# Patient Record
Sex: Female | Born: 1955
Health system: Southern US, Community
[De-identification: ages and names within clinical notes are randomized; demographics above are authoritative.]

## PROBLEM LIST (undated history)

## (undated) DIAGNOSIS — H548 Legal blindness, as defined in USA: Secondary | ICD-10-CM

## (undated) DIAGNOSIS — G43909 Migraine, unspecified, not intractable, without status migrainosus: Secondary | ICD-10-CM

## (undated) DIAGNOSIS — I10 Essential (primary) hypertension: Secondary | ICD-10-CM

## (undated) DIAGNOSIS — E785 Hyperlipidemia, unspecified: Secondary | ICD-10-CM

## (undated) DIAGNOSIS — R42 Dizziness and giddiness: Secondary | ICD-10-CM

## (undated) DIAGNOSIS — K219 Gastro-esophageal reflux disease without esophagitis: Secondary | ICD-10-CM

## (undated) DIAGNOSIS — M1990 Unspecified osteoarthritis, unspecified site: Secondary | ICD-10-CM

## (undated) DIAGNOSIS — Z9884 Bariatric surgery status: Secondary | ICD-10-CM

## (undated) DIAGNOSIS — Z87442 Personal history of urinary calculi: Secondary | ICD-10-CM

## (undated) DIAGNOSIS — F419 Anxiety disorder, unspecified: Secondary | ICD-10-CM

## (undated) DIAGNOSIS — G4730 Sleep apnea, unspecified: Secondary | ICD-10-CM

## (undated) DIAGNOSIS — C50919 Malignant neoplasm of unspecified site of unspecified female breast: Secondary | ICD-10-CM

## (undated) DIAGNOSIS — R7303 Prediabetes: Secondary | ICD-10-CM

## (undated) HISTORY — DX: Malignant neoplasm of unspecified site of unspecified female breast: C50.919

## (undated) HISTORY — DX: Migraine, unspecified, not intractable, without status migrainosus: G43.909

## (undated) HISTORY — DX: Hyperlipidemia, unspecified: E78.5

## (undated) HISTORY — PX: EYE SURGERY: SHX253

## (undated) HISTORY — DX: Morbid (severe) obesity due to excess calories: E66.01

## (undated) HISTORY — DX: Bariatric surgery status: Z98.84

---

## 1990-06-15 HISTORY — PX: KNEE SURGERY: SHX244

## 1998-06-18 ENCOUNTER — Other Ambulatory Visit: Admission: RE | Admit: 1998-06-18 | Discharge: 1998-06-18 | Payer: Self-pay | Admitting: Obstetrics and Gynecology

## 1999-02-06 ENCOUNTER — Emergency Department (HOSPITAL_COMMUNITY): Admission: EM | Admit: 1999-02-06 | Discharge: 1999-02-06 | Payer: Self-pay | Admitting: Emergency Medicine

## 1999-02-10 ENCOUNTER — Emergency Department (HOSPITAL_COMMUNITY): Admission: EM | Admit: 1999-02-10 | Discharge: 1999-02-10 | Payer: Self-pay | Admitting: Emergency Medicine

## 1999-02-14 ENCOUNTER — Encounter: Payer: Self-pay | Admitting: Emergency Medicine

## 1999-02-14 ENCOUNTER — Emergency Department (HOSPITAL_COMMUNITY): Admission: EM | Admit: 1999-02-14 | Discharge: 1999-02-14 | Payer: Self-pay | Admitting: Emergency Medicine

## 2000-03-10 ENCOUNTER — Other Ambulatory Visit: Admission: RE | Admit: 2000-03-10 | Discharge: 2000-03-10 | Payer: Self-pay | Admitting: Obstetrics and Gynecology

## 2000-08-24 ENCOUNTER — Other Ambulatory Visit: Admission: RE | Admit: 2000-08-24 | Discharge: 2000-08-24 | Payer: Self-pay | Admitting: Obstetrics and Gynecology

## 2001-03-28 ENCOUNTER — Other Ambulatory Visit: Admission: RE | Admit: 2001-03-28 | Discharge: 2001-03-28 | Payer: Self-pay | Admitting: *Deleted

## 2002-07-31 ENCOUNTER — Other Ambulatory Visit: Admission: RE | Admit: 2002-07-31 | Discharge: 2002-07-31 | Payer: Self-pay | Admitting: Obstetrics and Gynecology

## 2003-09-05 ENCOUNTER — Other Ambulatory Visit: Admission: RE | Admit: 2003-09-05 | Discharge: 2003-09-05 | Payer: Self-pay | Admitting: Obstetrics and Gynecology

## 2003-09-24 ENCOUNTER — Ambulatory Visit (HOSPITAL_COMMUNITY): Admission: RE | Admit: 2003-09-24 | Discharge: 2003-09-24 | Payer: Self-pay | Admitting: Obstetrics and Gynecology

## 2003-11-15 ENCOUNTER — Encounter (INDEPENDENT_AMBULATORY_CARE_PROVIDER_SITE_OTHER): Payer: Self-pay | Admitting: *Deleted

## 2003-11-15 ENCOUNTER — Inpatient Hospital Stay (HOSPITAL_COMMUNITY): Admission: RE | Admit: 2003-11-15 | Discharge: 2003-11-19 | Payer: Self-pay | Admitting: Obstetrics and Gynecology

## 2004-06-15 HISTORY — PX: ABDOMINAL HYSTERECTOMY: SHX81

## 2004-09-10 ENCOUNTER — Other Ambulatory Visit: Admission: RE | Admit: 2004-09-10 | Discharge: 2004-09-10 | Payer: Self-pay | Admitting: Obstetrics and Gynecology

## 2005-03-02 ENCOUNTER — Ambulatory Visit (HOSPITAL_COMMUNITY): Admission: RE | Admit: 2005-03-02 | Discharge: 2005-03-02 | Payer: Self-pay | Admitting: General Surgery

## 2006-03-23 ENCOUNTER — Encounter: Admission: RE | Admit: 2006-03-23 | Discharge: 2006-06-21 | Payer: Self-pay | Admitting: Obstetrics and Gynecology

## 2006-06-15 HISTORY — PX: COLONOSCOPY: SHX174

## 2007-05-09 LAB — HM COLONOSCOPY

## 2008-04-10 ENCOUNTER — Encounter: Admission: RE | Admit: 2008-04-10 | Discharge: 2008-04-10 | Payer: Self-pay | Admitting: Family Medicine

## 2010-10-31 NOTE — Op Note (Signed)
NAMESHELENE, [name]                         ACCOUNT NO.:  [id number]   MEDICAL RECORD NO.:  [id number]                   PATIENT TYPE:  INP   LOCATION:  9399                                 FACILITY:  [facility]   PHYSICIAN:  Duke Salvia. Marcelle Overlie, M.D.            DATE OF BIRTH:  04-09-1956   DATE OF PROCEDURE:  11/15/2003  DATE OF DISCHARGE:                                 OPERATIVE REPORT   PREOPERATIVE DIAGNOSES:  Leiomyoma, probable right ovarian dermoid cyst.   POSTOPERATIVE DIAGNOSES:  Leiomyoma, probable right ovarian dermoid cyst.   PROCEDURE:  Laparotomy with total abdominal hysterectomy, bilateral salpingo-  oophorectomy.   SURGEON:  Duke Salvia. Marcelle Overlie, M.D.   ASSISTANT:  Stann Mainland. Vincente Poli, M.D.   ANESTHESIA:  General endotracheal.   COMPLICATIONS:  None.   DRAINS:  Foley catheter.   SPECIMENS:  Bilateral tubes and ovaries, uterus.   ESTIMATED BLOOD LOSS:  300.   DESCRIPTION OF PROCEDURE:  The patient was taken to the operating room and  after an adequate level of general endotracheal anesthesia was obtained with  the patient supine, the abdomen prepped and draped in the usual manner for  __________ procedures, the vagina was prepped separately with Betadine and a  Foley catheter was positioned.  A transverse Pfannenstiel incision was made  three fingerbreadths above the symphysis, carried down the fascia which was  incised and extended transversely. The rectus muscle was divided in the  midline, peritoneum entered superiorly without incident and extended in a  vertical fashion.  O'Connor-O'Sullivan retractor was then positioned, bowels  were packed superiorly out of the field, the patient placed in  Trendelenburg. The uterus itself was 10-12 week size with irregular  fibroids, the cul-de-sac was free and clear, the left ovary was  unremarkable.  The right ovary was enlarged approximately 5 cm with a smooth  wall cyst that was not adherent.  Long Kelly clamps were  then placed at each  uteroovarian pedicle starting on the left, the round ligaments were clamped,  divided and suture ligated with #0 Vicryl suture.  The retroperitoneal space  on that side was developed, the course of the ureter was well below, the  left IP ligament was isolated, clamped, divided, first free tied followed by  a suture ligature of #0 Vicryl.  The exact same repeated on the opposite  side. After the right ovary was removed, it was incised off the field and  sebaceous material was seen consistent with a benign dermoid cyst.  The  uterine vasculature pedicles on either side were then skeletonized,  peritoneum carried around to the midline anteriorly, the bladder was  advanced inferiorly with sharp and blunt dissection below the cervicovaginal  junction.  In sequential manner, the uterine vasculature pedicles were  clamped, divided and suture ligated with #0 Vicryl suture followed by the  cardinal ligament, uterosacral ligament and cervicovaginal pedicles. The  specimen was then excised, the vaginal cuff  was closed with a running locked  2-0 Dexon suture.  The pelvis was irrigated with saline and noted to be  hemostatic. Prior to closure, sponge, needle and instrument counts were  reported as correct x2, peritoneum closed with 2-0 Dexon suture, rectus  muscles reapproximated with 2-0 Dexon interrupted sutures. The fascia closed  from laterally to midline on either side with a #0 PDS suture.  Prior to  complete closure, the On-Q pump catheter was placed subfascial and subcu and  attached per protocol. Once the fascia was closed, subcutaneous tissue was  irrigated and noted to be hemostatic.  Clips and Steri-Strips used on the  skin.  She tolerated this well and went to the recovery room in good  condition.                                               Richard M. Marcelle Overlie, M.D.    RMH/MEDQ  D:  11/15/2003  T:  11/15/2003  Job:  [id number]

## 2010-10-31 NOTE — Discharge Summary (Signed)
Rebecca [name], Rebecca [name]                         ACCOUNT NO.:  [id number]   MEDICAL RECORD NO.:  [id number]                   PATIENT TYPE:  INP   LOCATION:  9320                                 [facility]ACILITY:  [facility]   PHYSICIAN:  Duke Salvia. Marcelle Overlie, M.D.            DATE OF BIRTH:  March 18, 1956   DATE OF ADMISSION:  11/15/2003  DATE OF DISCHARGE:  11/19/2003                                 DISCHARGE SUMMARY   DISCHARGE DIAGNOSES:  1. Symptomatic leiomyoma, right ovarian cyst.  2. Total abdominal hysterectomy/bilateral salpingo-oophorectomy this     admission.  3. Postoperative bronchitis, mild bronchospasm.   SUMMARY OF THE HISTORY AND PHYSICAL EXAMINATION:  Please see admission H&P  for details but briefly, a 55 year old G5 P3 with symptomatic leiomyoma and  a right ovarian cyst presents for TAH/BSO.   HOSPITAL COURSE:  On November 15, 2003 under general anesthesia the patient  underwent TAH/BSO.  The right ovary was enlarged to 4 cm, appeared to be a  dermoid cyst.  On postoperative day #1 she was afebrile; WBC 11,000;  hemoglobin 8.5 down from a preoperative of 11.  She developed some increased  upper airway wheezing and was given several albuterol treatments and was  felt to possibly have bronchitis and was started on oral Z-Pak on  postoperative day #2.  She was still afebrile at that point.  Chest x-ray  was done that showed poor inspiration with minimal bronchitic changes.  She  was started on an Alupent inhaler q.i.d.  On postoperative day #3 her  temperature max was 100.6.  Respiratory therapy was consulted and did peak  flow which was normal.  They suggested the possibility of reflux which she  has had problems with in the past and she was started on Protonix.  By June  6 after being on Protonix with Alupent inhaler and a Z-Pak she stated her  breathing was much better.  She cleared some upper airway mucous.  Her lungs  were clear.  She had a low-grade temperature at 99.2.  The incision  was  clean and dry at that point.  She had established normal bowel and urinary  function and was ready for discharge.   LABORATORY DATA:  Preoperative hemoglobin was 11.0.  CBC on November 17, 2003:  WBC 10.4, hemoglobin 8.2.  Admission UA was negative.  Blood type is B  positive, antibody screen is negative.  Pathology report is still pending.   DISPOSITION:  The patient discharged on:  1. Tylox p.r.n. pain,  2. Protonix 40 mg once daily.  3. Z-Pak as directed.  4. Alupent inhaler two puffs q.i.d. p.r.n.   We will see her back in 3 days in our office.  Advised to report any fever  over 101, increased pain or bleeding, incisional redness or drainage, or  shortness of breath.  She was given specific instructions regarding diet,  sex, exercise.   CONDITION:  Good.  ACTIVITY:  Graded increase.                                               Richard M. Marcelle Overlie, M.D.    RMH/MEDQ  D:  11/19/2003  T:  11/19/2003  Job:  [id number]

## 2010-10-31 NOTE — H&P (Signed)
NAMEESTRELLA, ALCARAZ                         ACCOUNT NO.:  [id number]   MEDICAL RECORD NO.:  [id number]                   PATIENT TYPE:  INP   LOCATION:  NA                                   [facility]   PHYSICIAN:  Duke Salvia. Marcelle Overlie, M.D.            DATE OF BIRTH:  02/08/1956   DATE OF ADMISSION:  DATE OF DISCHARGE:                                HISTORY & PHYSICAL   CHIEF COMPLAINT:  Menorrhagia, pelvic pain, leiomyoma.   HISTORY OF PRESENT ILLNESS:  A 55 year old G5, P3 who is noted in the last  year to have heavier cycles, although last year her hemoglobin was on the  anemic side at 10.1.  She had endometrial biopsy performed Oct 23, 2003,  that showed benign proliferative endometrium.  CA125 23.2.  This was done  after CT scan September 24, 2003, showed normal abdominal CT scan.  No pelvic  adenopathy noted.  Right adnexa had a cyst measuring 3.8 x 5.9 x 4.0 most  compatible with a dermoid.  There was no internal calcification noted. The  remainder of the right ovary was unremarkable.  The uterus was enlarged and  lobulated, consistent with leiomyoma.   Our ultrasound in the office September 18, 2003, had shown fibroids, the largest  6.6 x 5.9, 5.8 x 4.4, with a cystic mass and what appeared to be a submucous  fibroid on saline infusion ultrasound.  She presents now for TAH/BSO.  The  procedure including risk of bleeding, infection, transfusion, adjacent organ  injury are reviewed with her.  Other risks regarding phlebitis, wound  infection, along with her expected recovery time, the possible need for ERT  all reviewed with her which she understands and accepts.   ALLERGIES:  1. SULFA.  2. HYDROCODONE.   CURRENT MEDICATIONS:  None.   OBSTETRICAL HISTORY:  Three vaginal deliveries at term.  Two TABs.   PAST SURGICAL HISTORY:  Arthroscopy on her right knee.   FAMILY HISTORY:  Significant for mother with breast cancer.  Father with  hypertension and heart disease.   PHYSICAL EXAMINATION:  VITAL SIGNS:  Temperature 98.2, blood pressure  114/80.  HEENT:  Unremarkable.  NECK:  Supple without masses.  LUNGS:  Clear.  CARDIOVASCULAR:  Regular rate and rhythm without murmurs, rubs, or gallops.  BREASTS:  Without masses.  ABDOMEN:  Soft, flat and nontender.  PELVIC:  Normal external genitalia, vagina and cervix clear.  Pap March 2005  was normal.  Uterus was enlarged and irregular.  Adnexa unremarkable.   IMPRESSION:  1. Symptomatic leiomyoma with anemia.  2. Ovarian cyst, probable dermoid, normal CA125.   PLAN:  Total abdominal hysterectomy, bilateral salpingo-oophorectomy.  Procedure and risks reviewed as above.  Richard M. Marcelle Overlie, M.D.    RMH/MEDQ  D:  11/13/2003  T:  11/13/2003  Job:  [id number]

## 2011-02-18 ENCOUNTER — Other Ambulatory Visit: Payer: Self-pay | Admitting: Allergy and Immunology

## 2011-02-18 ENCOUNTER — Ambulatory Visit
Admission: RE | Admit: 2011-02-18 | Discharge: 2011-02-18 | Disposition: A | Discharge status: Home / Self Care | Payer: 59 | Source: Ambulatory Visit | Attending: Allergy and Immunology | Admitting: Allergy and Immunology

## 2011-02-18 DIAGNOSIS — R059 Cough, unspecified: Secondary | ICD-10-CM

## 2011-02-18 DIAGNOSIS — R05 Cough: Secondary | ICD-10-CM

## 2011-06-03 ENCOUNTER — Ambulatory Visit (INDEPENDENT_AMBULATORY_CARE_PROVIDER_SITE_OTHER): Payer: 59 | Admitting: Surgery

## 2011-06-03 ENCOUNTER — Encounter (INDEPENDENT_AMBULATORY_CARE_PROVIDER_SITE_OTHER): Payer: Self-pay | Admitting: Surgery

## 2011-06-03 ENCOUNTER — Other Ambulatory Visit (INDEPENDENT_AMBULATORY_CARE_PROVIDER_SITE_OTHER): Payer: Self-pay | Admitting: Surgery

## 2011-06-03 VITALS — BP 142/88 | HR 70 | Temp 97.6°F | Resp 18 | Ht 62.0 in | Wt 235.8 lb

## 2011-06-03 DIAGNOSIS — E66813 Obesity, class 3: Secondary | ICD-10-CM

## 2011-06-03 HISTORY — DX: Morbid (severe) obesity due to excess calories: E66.01

## 2011-06-03 HISTORY — DX: Obesity, class 3: E66.813

## 2011-06-03 NOTE — Patient Instructions (Signed)
Rebecca [name] will schedule appointments

## 2011-06-03 NOTE — Progress Notes (Signed)
Addended by: Latricia Heft on: 06/03/2011 12:01 PM   Modules accepted: Orders

## 2011-06-03 NOTE — Progress Notes (Signed)
Chief Complaint:  Morbid obesity BMI 43  History of Present Illness:  Rebecca [name] is an 55 y.o. female who is the wife of [name] he used to I operated on around 2000 with severe Crohn's disease that after complications left him with a ileostomy. She has been to one of our seminars and is interested in lap band surgery. She is aware of the risk and benefits but has tried many things and has been unsuccessful in having sustained weight loss. She is followed by DrClovis Riley.  I discussed the lap band with her and gave her the booklet provided by [employer]. We'll proceed with preliminary workup for laparoscopic gastric banding.  Past Medical History  Diagnosis Date  . Asthma   . Hyperlipidemia   . Hearing loss   . Cough   . Wheezing     Past Surgical History  Procedure Date  . Abdominal hysterectomy 2006  . Eye surgery     Patient unsure of surgery date  . Knee surgery 1992    Medications Prior to Admission  Medication Sig Dispense Refill  . diclofenac (VOLTAREN) 75 MG EC tablet as needed.      [name] estradiol (CLIMARA - DOSED IN MG/24 HR) 0.025 mg/24hr every 7 (seven) days.      [name] lisinopril (PRINIVIL,ZESTRIL) 10 MG tablet Daily.      [name] SINGULAIR 10 MG tablet Daily.      [name] omeprazole (PRILOSEC) 20 MG capsule        No current facility-administered medications on file as of 06/03/2011.   Allergies  Allergen Reactions  . Hydrocodone Hives, Shortness Of Breath and Itching  . Sulfur Itching and Rash   Family History  Problem Relation Age of Onset  . Cancer Mother     breast   Social History:   reports that she quit smoking about 18 years ago. She has never used smokeless tobacco. She reports that she does not drink alcohol or use illicit drugs.   REVIEW OF SYSTEMS - PERTINENT POSITIVES ONLY: Positive for asthma high cholesterol cough wheezing hearing loss  Physical Exam:   Blood pressure 142/88, pulse 70, temperature 97.6 F (36.4 C), temperature source Temporal, resp.  rate 18, height 5\' 2"  (1.575 m), weight 235 lb 12.8 oz (106.958 kg). Body mass index is 43.13 kg/(m^2).  Gen:  No acute distress.  Well nourished and well groomed.   Neurological: Alert and oriented to person, place, and time. Coordination normal.  Head: Normocephalic and atraumatic.  Eyes: Conjunctivae are normal. Pupils are equal, round, and reactive to light. No scleral icterus.  Neck: Normal range of motion. Neck supple. No tracheal deviation or thyromegaly present.  Cardiovascular:  SR without murmurs or gallops Respiratory: Effort normal.  No respiratory distress. No chest wall tenderness. Breath sounds normal.  No wheezes, rales or rhonchi.  GI: Soft. Bowel sounds are normal. The abdomen is soft and nontender.  There is no rebound and no guarding. GU:  No inguinal herniae Musculoskeletal: Normal range of motion. Extremities are nontender.  Lymphadenopathy: No cervical, preauricular, postauricular or axillary adenopathy is present Skin: Skin is warm and dry. No rash noted. No diaphoresis. No erythema. No pallor. No clubbing, cyanosis, or edema.  Pscyh: Normal mood and affect. Behavior is normal. Judgment and thought content normal.   LABORATORY RESULTS: No results found for this or any previous visit (from the past 48 hour(s)).  RADIOLOGY RESULTS: No results found.  Problem List: Active Problems:  * No active hospital problems. *  Assessment & Plan: Morbid obesity with BMI of 43. Felt to be a good candidate for lapband. We'll proceed with workup.    Matt B. Daphine Deutscher, MD, [facility] [phone] beeper [phone]  06/03/2011 11:43 AM

## 2011-06-04 ENCOUNTER — Other Ambulatory Visit (INDEPENDENT_AMBULATORY_CARE_PROVIDER_SITE_OTHER): Payer: Self-pay | Admitting: General Surgery

## 2011-06-04 LAB — COMPREHENSIVE METABOLIC PANEL
ALT: 31 U/L (ref 0–35)
AST: 25 U/L (ref 0–37)
CO2: 26 mEq/L (ref 19–32)
Calcium: 9.9 mg/dL (ref 8.4–10.5)
Chloride: 104 mEq/L (ref 96–112)
Sodium: 142 mEq/L (ref 135–145)
Total Protein: 6.9 g/dL (ref 6.0–8.3)

## 2011-06-04 LAB — CBC
Platelets: 422 10*3/uL — ABNORMAL HIGH (ref 150–400)
RBC: 4.6 MIL/uL (ref 3.87–5.11)
WBC: 9.3 10*3/uL (ref 4.0–10.5)

## 2011-06-04 LAB — T4: T4, Total: 10.6 ug/dL (ref 5.0–12.5)

## 2011-06-05 ENCOUNTER — Other Ambulatory Visit (INDEPENDENT_AMBULATORY_CARE_PROVIDER_SITE_OTHER): Payer: Self-pay | Admitting: Surgery

## 2011-06-05 ENCOUNTER — Other Ambulatory Visit (INDEPENDENT_AMBULATORY_CARE_PROVIDER_SITE_OTHER): Payer: Self-pay | Admitting: General Surgery

## 2011-06-17 ENCOUNTER — Encounter (HOSPITAL_COMMUNITY)
Admission: RE | Disposition: A | Discharge status: Home / Self Care | Payer: Self-pay | Source: Ambulatory Visit | Attending: Surgery

## 2011-06-17 ENCOUNTER — Ambulatory Visit (HOSPITAL_COMMUNITY)
Admission: RE | Admit: 2011-06-17 | Discharge: 2011-06-17 | Disposition: A | Discharge status: Home / Self Care | Payer: 59 | Source: Ambulatory Visit | Attending: Surgery | Admitting: Surgery

## 2011-06-17 SURGERY — BREATH TEST, FOR HELICOBACTER PYLORI

## 2011-06-18 ENCOUNTER — Encounter (HOSPITAL_COMMUNITY): Payer: Self-pay

## 2011-06-22 ENCOUNTER — Encounter: Payer: Self-pay | Admitting: *Deleted

## 2011-06-22 ENCOUNTER — Encounter: Payer: 59 | Attending: Surgery | Admitting: *Deleted

## 2011-06-22 DIAGNOSIS — Z713 Dietary counseling and surveillance: Secondary | ICD-10-CM | POA: Insufficient documentation

## 2011-06-22 DIAGNOSIS — Z01818 Encounter for other preprocedural examination: Secondary | ICD-10-CM | POA: Insufficient documentation

## 2011-06-22 NOTE — Progress Notes (Signed)
  Pre-Op Assessment Visit: Pre-Operative LAGB Surgery  Medical Nutrition Therapy:  Appt start time: 1100 end time:  1200.  Patient was seen on 06/22/2011 for Pre-Operative LAGB Nutrition Assessment. Assessment and letter of approval faxed to [facility] Bariatric Surgery Program coordinator on 06/22/2011.  Approval letter sent to [facility] center and will be available in the chart under the media tab.  Handouts given during visit include:  Pre-Op Goals Handout  Patient to call for Pre-Op and Post-Op Nutrition Education at the [facility] when surgery is scheduled.

## 2011-06-22 NOTE — Patient Instructions (Signed)
   Follow Pre-Op Nutrition Goals to prepare for LAGB Surgery.   Call the [facility] at [phone] once you have been given your surgery date to enrolled in the Pre-Op Nutrition Class. You will need to attend this nutrition class 3-4 weeks prior to your surgery. 

## 2011-06-24 ENCOUNTER — Other Ambulatory Visit: Payer: Self-pay

## 2011-06-24 ENCOUNTER — Ambulatory Visit (HOSPITAL_COMMUNITY)
Admission: RE | Admit: 2011-06-24 | Discharge: 2011-06-24 | Disposition: A | Discharge status: Home / Self Care | Payer: 59 | Source: Ambulatory Visit | Attending: Surgery | Admitting: Surgery

## 2011-06-24 DIAGNOSIS — E785 Hyperlipidemia, unspecified: Secondary | ICD-10-CM | POA: Insufficient documentation

## 2011-06-24 DIAGNOSIS — Z13820 Encounter for screening for osteoporosis: Secondary | ICD-10-CM | POA: Insufficient documentation

## 2011-06-24 DIAGNOSIS — Z6841 Body Mass Index (BMI) 40.0 and over, adult: Secondary | ICD-10-CM | POA: Insufficient documentation

## 2011-06-24 DIAGNOSIS — I10 Essential (primary) hypertension: Secondary | ICD-10-CM | POA: Insufficient documentation

## 2011-06-24 DIAGNOSIS — K7689 Other specified diseases of liver: Secondary | ICD-10-CM | POA: Insufficient documentation

## 2011-07-06 ENCOUNTER — Telehealth (INDEPENDENT_AMBULATORY_CARE_PROVIDER_SITE_OTHER): Payer: Self-pay | Admitting: General Surgery

## 2011-07-06 NOTE — Telephone Encounter (Signed)
Patient was contacted with a positive result for her Breath Tek assessment. Instructed her that she will need to be put on a Prev-Pac for 14 days, pt understood and had no further questions. Rx called to [facility] on [address] spoke with [facility].

## 2011-07-07 ENCOUNTER — Telehealth (INDEPENDENT_AMBULATORY_CARE_PROVIDER_SITE_OTHER): Payer: Self-pay | Admitting: Surgery

## 2011-08-17 ENCOUNTER — Encounter (HOSPITAL_COMMUNITY)
Admission: RE | Disposition: A | Discharge status: Home Health | Payer: Self-pay | Source: Ambulatory Visit | Attending: Surgery

## 2011-08-17 ENCOUNTER — Telehealth (INDEPENDENT_AMBULATORY_CARE_PROVIDER_SITE_OTHER): Payer: Self-pay | Admitting: Surgery

## 2011-08-17 ENCOUNTER — Ambulatory Visit (HOSPITAL_COMMUNITY)
Admission: RE | Admit: 2011-08-17 | Discharge: 2011-08-17 | Disposition: A | Discharge status: Home Health | Payer: 59 | Source: Ambulatory Visit | Attending: Surgery | Admitting: Surgery

## 2011-08-17 DIAGNOSIS — Z01818 Encounter for other preprocedural examination: Secondary | ICD-10-CM | POA: Insufficient documentation

## 2011-08-17 HISTORY — PX: BREATH TEK H PYLORI: SHX5422

## 2011-08-17 SURGERY — BREATH TEST, FOR HELICOBACTER PYLORI

## 2011-08-18 ENCOUNTER — Encounter (HOSPITAL_COMMUNITY): Payer: Self-pay

## 2011-08-18 ENCOUNTER — Encounter (HOSPITAL_COMMUNITY): Payer: Self-pay | Admitting: Surgery

## 2011-08-20 ENCOUNTER — Other Ambulatory Visit (INDEPENDENT_AMBULATORY_CARE_PROVIDER_SITE_OTHER): Payer: Self-pay

## 2011-08-20 ENCOUNTER — Other Ambulatory Visit (INDEPENDENT_AMBULATORY_CARE_PROVIDER_SITE_OTHER): Payer: Self-pay | Admitting: General Surgery

## 2011-09-01 ENCOUNTER — Ambulatory Visit (HOSPITAL_BASED_OUTPATIENT_CLINIC_OR_DEPARTMENT_OTHER): Payer: 59 | Attending: Surgery | Admitting: General Practice

## 2011-09-01 VITALS — Ht 62.0 in | Wt 235.0 lb

## 2011-09-01 DIAGNOSIS — G4733 Obstructive sleep apnea (adult) (pediatric): Secondary | ICD-10-CM

## 2011-09-05 DIAGNOSIS — G4733 Obstructive sleep apnea (adult) (pediatric): Secondary | ICD-10-CM

## 2011-09-05 NOTE — Procedures (Signed)
Rebecca [name], Rebecca [name]               ACCOUNT NO.:  [id number]  MEDICAL RECORD NO.:  [id number]          PATIENT TYPE:  OUT  LOCATION:  SLEEP CENTER                 FACILITY:  [facility], [place]  PHYSICIAN:  Dimitris Shanahan D. Maple Hudson, MD, FCCP, FACPDATE OF BIRTH:  19-Aug-1955  DATE OF STUDY:  09/01/2011                           NOCTURNAL POLYSOMNOGRAM  REFERRING PHYSICIAN:  Thornton Park. Daphine Deutscher, MD  REFERRING PHYSICIAN:  Thornton Park. Daphine Deutscher, MD  INDICATION FOR STUDY:  Hypersomnia with sleep apnea.  EPWORTH SLEEPINESS SCORE:  5/24.  BMI 43, weight 235 pounds, height 62 inches, neck 17 inch.  MEDICATIONS:  Home medications are charted and reviewed.  SLEEP ARCHITECTURE:  Total sleep time 172.5 minutes with sleep efficiency 41.1%.  Stage I was 29.6%, stage II 70.4% of total sleep time.  Stages III and REM were absent.  Sleep latency 31 minute, awake after sleep onset 216.5 minutes, arousal index 55.3.  Bedtime medication:  Hall's cough drops.  RESPIRATORY DATA:  Apnea-hypopnea index (AHI) 59.1 per hour.  A total of 170 events were scored including 3 obstructive apneas, 1 central apnea, 166 hypopneas.  Events were not positional.  REM was absent.  There was insufficient sleep and numbers of respiratory events during the early part of the night to permit application of split protocol CPAP titration on this study night.  OXYGEN DATA:  Moderately loud snoring with oxygen desaturation to a nadir of 82% and mean oxygen saturation through the study of 89.6% on room air.  CARDIAC DATA:  Normal sinus rhythm.  MOVEMENT-PARASOMNIA:  No significant movement disturbance.  Bathroom x2.  IMPRESSIONS-RECOMMENDATIONS: 1. Sleep was markedly fragmented throughout the night with difficulty     maintaining sleep until after 1 a.m. 2. Severe obstructive sleep apnea/hypopnea syndrome, apnea/hypopnea     index 59.1 per hour with non-positional events.  Moderately loud     snoring with oxygen desaturation to a nadir of 82% and  mean oxygen     saturation through the study of 89.6% on room air. 3. Insufficient sleep and numbers of respiratory events in the first     hours of the study to meet protocol requirements for application of     split protocol, continuous positive airway pressure titration on     this study night.  Consider return for CPAP titration as a     dedicated study or evaluate for alternative management as     clinically appropriate.  Weight loss is expected to be beneficial.     Easton Fetty D. Maple Hudson, MD, [facility], FACP Diplomate, American Board of Sleep Medicine    CDY/MEDQ  D:  09/05/2011 11:15:13  T:  09/05/2011 11:46:30  Job:  [id number]

## 2011-10-01 ENCOUNTER — Encounter: Payer: 59 | Attending: Surgery | Admitting: *Deleted

## 2011-10-01 DIAGNOSIS — Z01818 Encounter for other preprocedural examination: Secondary | ICD-10-CM | POA: Insufficient documentation

## 2011-10-01 DIAGNOSIS — Z713 Dietary counseling and surveillance: Secondary | ICD-10-CM | POA: Insufficient documentation

## 2011-10-01 DIAGNOSIS — E66813 Obesity, class 3: Secondary | ICD-10-CM

## 2011-10-04 ENCOUNTER — Encounter: Payer: Self-pay | Admitting: *Deleted

## 2011-10-04 NOTE — Patient Instructions (Addendum)
Follow:   Pre-Op Diet per MD 2 weeks prior to surgery  Phase 2- Liquids (clear/full) 2 weeks after surgery  Vitamin/Mineral/Calcium guidelines for purchasing bariatric supplements  Exercise guidelines pre and post-op per MD  Follow-up at [facility] in 2 weeks post-op for diet advancement. Contact Ahleah Simko as needed with questions/concerns. 

## 2011-10-04 NOTE — Progress Notes (Signed)
  Bariatric Class:  Appt start time: 0830 end time:  0930.  Pre-Operative Nutrition Class  Patient was seen on 10/01/2011 for Pre-Operative Bariatric Surgery Education at the [facility].  Surgery date: 10/20/11 Surgery type: LAGB  Last weight: 236.2 lbs (06/22/11)  Samples given per MNT protocol: Bariatric Advantage Multivitamin Lot # [id number] Exp: 09/13  Bariatric Advantage Calcium Citrate Lot # [id number] Exp: 09/13  Bariatric Advantage B-12 dots Lot # [id number] Exp: 05/13  Celebrate Vitamins Multivitamin Lot # [id number] Exp: 06/14  Celebrate Vitamins Calcium Citrate Lot # [id number] Exp: 07/13  Celebrate Vitamins B-12 dots Lot # [id number] Exp: 07/14  Corliss Marcus  Lot # [id number] Exp: 06/4  The following the learning objective met by the patient during this course:   Identifies Pre-Op Dietary Goals and will begin 2 weeks pre-operatively   Identifies appropriate sources of fluids and proteins   States protein recommendations and appropriate sources pre and post-operatively  Identifies Post-Operative Dietary Goals and will follow for 2 weeks post-operatively  Identifies appropriate multivitamin and calcium sources  Describes the need for physical activity post-operatively and will follow MD recommendations  States when to call healthcare provider regarding medication questions or post-operative complications  Handouts given during class include:  Pre-Op Bariatric Surgery Diet Handout  Protein Shake Handout  Post-Op Bariatric Surgery Nutrition Handout  BELT Program Information Flyer  Support Group Information Flyer  Follow-Up Plan: Patient will follow-up at [facility] 2 weeks post operatively for diet advancement per MD.

## 2011-10-05 ENCOUNTER — Encounter: Payer: Self-pay | Admitting: Pulmonary Disease

## 2011-10-05 ENCOUNTER — Ambulatory Visit (INDEPENDENT_AMBULATORY_CARE_PROVIDER_SITE_OTHER): Payer: 59 | Admitting: Pulmonary Disease

## 2011-10-05 VITALS — BP 128/80 | HR 79 | Temp 98.0°F | Ht 62.0 in | Wt 233.2 lb

## 2011-10-05 DIAGNOSIS — G4733 Obstructive sleep apnea (adult) (pediatric): Secondary | ICD-10-CM

## 2011-10-05 NOTE — Patient Instructions (Signed)
Will set up for cpap at a moderate pressure to start with, and call if having issues with tolerance. Work on weight loss followup with me in 5 weeks.

## 2011-10-05 NOTE — Assessment & Plan Note (Signed)
The patient has been found to have severe obstructive sleep apnea, and she is also symptomatic during the day and while sleeping.  She is scheduled to have bariatric surgery, and I have discussed the importance of treatment of her sleep apnea leading up to and also after her surgery.  I have also discussed the impact to her cardiovascular health.  At this point, I would like to start her on CPAP, and she is agreeable. I will set the patient up on cpap at a moderate pressure level to allow for desensitization, and will troubleshoot the device over the next 4-6weeks if needed.  The pt is to call me if having issues with tolerance.  Will then optimize the pressure once patient is able to wear cpap on a consistent basis.

## 2011-10-05 NOTE — Progress Notes (Signed)
  Subjective:    Patient ID: Rebecca [name], female    DOB: [birth year], 56 y.o.   MRN: [id number]  HPI The patient is a 56 year old female who been asked to see for management of obstructive sleep apnea.  She underwent a sleep study last month which showed severe obstructive sleep apnea, with an AHI of 59 events per hour.  She is scheduled to have bariatric surgery next month.  The patient has been noted to have loud snoring, as well as an abnormal breathing pattern during sleep.  She has frequent awakenings at night, as well as nonrestorative sleep.  She has significant sleep pressure during the day, and will get sleepy in the evenings watching television.  She also has some sleepiness with driving.  The patient states that her weight is up over 15 pounds over the last 2 years, and her Epworth score at the time of her sleep study was only 5 however.  Sleep Questionnaire: What time do you typically go to bed?( Between what hours) 10pm to 11 pm How long does it take you to fall asleep? 15 to 30 mins How many times during the night do you wake up? 6 What time do you get out of bed to start your day? 0630 Do you drive or operate heavy machinery in your occupation? No How much has your weight changed (up or down) over the past two years? (In pounds) 15 lb (6.804 kg) Have you ever had a sleep study before? Yes If yes, location of study? [facility] If yes, date of study? 08/2011 Do you currently use CPAP? No Do you wear oxygen at any time? No    Review of Systems  Constitutional: Negative for fever and unexpected weight change.  HENT: Negative for ear pain, nosebleeds, congestion, sore throat, rhinorrhea, sneezing, trouble swallowing, dental problem, postnasal drip and sinus pressure.   Eyes: Negative for redness and itching.  Respiratory: Positive for shortness of breath. Negative for cough, chest tightness and wheezing.   Cardiovascular: Negative for palpitations and leg swelling.  Gastrointestinal: Negative  for nausea and vomiting.  Genitourinary: Negative for dysuria.  Musculoskeletal: Negative for joint swelling.  Skin: Negative for rash.  Neurological: Negative for headaches.  Hematological: Does not bruise/bleed easily.  Psychiatric/Behavioral: Negative for dysphoric mood. The patient is not nervous/anxious.        Objective:   Physical Exam Constitutional:  Obese female, no acute distress  HENT:  Nares patent without discharge, but septal deviation to left with narrowing.   Oropharynx without exudate, palate and uvula are thickened and elongated.   Eyes:  Perrla, eomi, no scleral icterus  Neck:  No JVD, no TMG  Cardiovascular:  Normal rate, regular rhythm, no rubs or gallops.  No murmurs        Intact distal pulses  Pulmonary :  Normal breath sounds, no stridor or respiratory distress   No rales, rhonchi, or wheezing  Abdominal:  Soft, nondistended, bowel sounds present.  No tenderness noted.   Musculoskeletal:  mild lower extremity edema noted.  Lymph Nodes:  No cervical lymphadenopathy noted  Skin:  No cyanosis noted  Neurologic:  Alert, appropriate, moves all 4 extremities without obvious deficit.         Assessment & Plan:

## 2011-10-12 ENCOUNTER — Encounter (HOSPITAL_COMMUNITY): Payer: Self-pay | Admitting: Pharmacy Technician

## 2011-10-13 ENCOUNTER — Encounter (HOSPITAL_COMMUNITY)
Admission: RE | Admit: 2011-10-13 | Discharge: 2011-10-13 | Disposition: A | Discharge status: Home / Self Care | Payer: 59 | Source: Ambulatory Visit | Attending: Surgery | Admitting: Surgery

## 2011-10-13 ENCOUNTER — Encounter (HOSPITAL_COMMUNITY): Payer: Self-pay

## 2011-10-13 ENCOUNTER — Other Ambulatory Visit (INDEPENDENT_AMBULATORY_CARE_PROVIDER_SITE_OTHER): Payer: Self-pay | Admitting: Surgery

## 2011-10-13 HISTORY — DX: Sleep apnea, unspecified: G47.30

## 2011-10-13 HISTORY — DX: Essential (primary) hypertension: I10

## 2011-10-13 HISTORY — DX: Legal blindness, as defined in USA: H54.8

## 2011-10-13 LAB — CBC
MCV: 92.3 fL (ref 78.0–100.0)
Platelets: 436 10*3/uL — ABNORMAL HIGH (ref 150–400)
RBC: 4.28 MIL/uL (ref 3.87–5.11)
WBC: 7.3 10*3/uL (ref 4.0–10.5)

## 2011-10-13 LAB — DIFFERENTIAL
Basophils Absolute: 0 10*3/uL (ref 0.0–0.1)
Basophils Relative: 1 % (ref 0–1)
Eosinophils Relative: 5 % (ref 0–5)
Monocytes Absolute: 0.6 10*3/uL (ref 0.1–1.0)
Monocytes Relative: 8 % (ref 3–12)

## 2011-10-13 LAB — SURGICAL PCR SCREEN
MRSA, PCR: NEGATIVE
Staphylococcus aureus: NEGATIVE

## 2011-10-13 LAB — COMPREHENSIVE METABOLIC PANEL
ALT: 47 U/L — ABNORMAL HIGH (ref 0–35)
AST: 38 U/L — ABNORMAL HIGH (ref 0–37)
Alkaline Phosphatase: 80 U/L (ref 39–117)
CO2: 27 mEq/L (ref 19–32)
Chloride: 105 mEq/L (ref 96–112)
GFR calc non Af Amer: >90 mL/min (ref >90)
Potassium: 4.3 mEq/L (ref 3.5–5.1)
Sodium: 141 mEq/L (ref 135–145)
Total Bilirubin: 0.2 mg/dL — ABNORMAL LOW (ref 0.3–1.2)

## 2011-10-13 NOTE — Patient Instructions (Addendum)
20 Rebecca [name]  10/13/2011   Your procedure is scheduled on:  10/20/11  Report to SHORT STAY DEPT  At 11:30 AM.  Call this number if you have problems the morning of surgery: [phone]   Remember:   Do not eat food or drink liquids AFTER MIDNIGHT  May have clear liquids UNTIL 6 HOURS BEFORE SURGERY  Clear liquids include soda, tea, black coffee, apple or grape juice, broth.  Take these medicines the morning of surgery with A SIP OF WATER: USE DULERA AND PATANASE INHALER S   Do not wear jewelry, make-up or nail polish.  Do not wear lotions, powders, or perfumes.   Do not shave legs or underarms 48 hrs. before surgery (men may shave face)  Do not bring valuables to the hospital.  Contacts, dentures or bridgework may not be worn into surgery.  Leave suitcase in the car. After surgery it may be brought to your room.  For patients admitted to the hospital, checkout time is 11:00 AM the day of discharge.   Patients discharged the day of surgery will not be allowed to drive home.    Special Instructions:   Please read over the following fact sheets that you were given: MRSA  Information               SHOWER WITH BETASEPT THE NIGHT BEFORE SURGERY AND THE MORNING OF SURGERY                  BRING C-PAP MASK TO HOSPITAL

## 2011-10-15 ENCOUNTER — Encounter (INDEPENDENT_AMBULATORY_CARE_PROVIDER_SITE_OTHER): Payer: Self-pay | Admitting: Surgery

## 2011-10-16 ENCOUNTER — Ambulatory Visit (INDEPENDENT_AMBULATORY_CARE_PROVIDER_SITE_OTHER): Payer: 59 | Admitting: Surgery

## 2011-10-16 ENCOUNTER — Encounter (INDEPENDENT_AMBULATORY_CARE_PROVIDER_SITE_OTHER): Payer: Self-pay | Admitting: Surgery

## 2011-10-16 VITALS — BP 142/98 | HR 80 | Temp 97.5°F | Resp 18 | Ht 62.0 in | Wt 225.4 lb

## 2011-10-16 DIAGNOSIS — E66813 Obesity, class 3: Secondary | ICD-10-CM

## 2011-10-16 NOTE — Progress Notes (Signed)
Chief Complaint:  Morbid obesity BMI 43  History of Present Illness:  Rebecca [name] is an 56 y.o. female who is the wife of [name] he used to I operated on around 2000 with severe Crohn's disease that after complications left him with a ileostomy. She has been to one of our seminars and is interested in lap band surgery. She is aware of the risk and benefits but has tried many things and has been unsuccessful in having sustained weight loss. She is followed by DrClovis Riley.  I discussed the lap band with her and gave her the booklet provided by [employer]. Her UGI was negative for GERD.  Her ultrasound showed fatty infiltration of the liver.  She is ready to undergo Lapband surgery and has been on the preop diet.    Past Medical History  Diagnosis Date  . Asthma   . Hyperlipidemia   . Hearing loss   . Cough   . Wheezing   . Hypertension   . Sleep apnea     uses c-pap  . Blindness, legal     L EYE    Past Surgical History  Procedure Date  . Abdominal hysterectomy 2006  . Eye surgery     Patient unsure of surgery date. Left eye  . Knee surgery 1992    right  . Breath tek h pylori 08/17/2011    Procedure: BREATH TEK H PYLORI;  Surgeon: Valarie Merino, MD;  Location: [facility];  Service: General;  Laterality: N/A;  PATIENT WILL COME AT 0715     (Not in a hospital admission) Allergies  Allergen Reactions  . Hydrocodone Hives, Shortness Of Breath and Itching  . Sulfur Itching and Rash   Family History  Problem Relation Age of Onset  . Cancer Mother     breast  . Heart disease Father    Social History:   reports that she quit smoking about 17 years ago. Her smoking use included Cigarettes. She has a 7.5 pack-year smoking history. She has never used smokeless tobacco. She reports that she does not drink alcohol or use illicit drugs.   REVIEW OF SYSTEMS - PERTINENT POSITIVES ONLY: Positive for asthma high cholesterol cough wheezing hearing loss  Physical Exam:   Blood  pressure 142/98, pulse 80, temperature 97.5 F (36.4 C), temperature source Temporal, resp. rate 18, height 5\' 2"  (1.575 m), weight 225 lb 6.4 oz (102.241 kg). Body mass index is 41.23 kg/(m^2).  Gen:  No acute distress.  Well nourished and well groomed.   Neurological: Alert and oriented to person, place, and time. Coordination normal.  Head: Normocephalic and atraumatic.  Eyes: Conjunctivae are normal. Pupils are equal, round, and reactive to light. No scleral icterus.  Neck: Normal range of motion. Neck supple. No tracheal deviation or thyromegaly present.  Cardiovascular:  SR without murmurs or gallops Respiratory: Effort normal.  No respiratory distress. No chest wall tenderness. Breath sounds normal.  No wheezes, rales or rhonchi.  GI: Soft. Bowel sounds are normal. The abdomen is soft and nontender.  There is no rebound and no guarding. GU:  No inguinal herniae Musculoskeletal: Normal range of motion. Extremities are nontender.  Lymphadenopathy: No cervical, preauricular, postauricular or axillary adenopathy is present Skin: Skin is warm and dry. No rash noted. No diaphoresis. No erythema. No pallor. No clubbing, cyanosis, or edema.  Pscyh: Normal mood and affect. Behavior is normal. Judgment and thought content normal.   LABORATORY RESULTS: No results found for this or any previous  visit (from the past 48 hour(s)).  RADIOLOGY RESULTS: No results found.  Problem List: Active Problems:  * No active hospital problems. *    Assessment & Plan: Morbid obesity with BMI of 43. Felt to be a good candidate for lapband. Proceed with lapband May 7th.     Matt B. Daphine Deutscher, MD, [facility] [phone] beeper [phone]  10/16/2011 4:19 PM

## 2011-10-20 ENCOUNTER — Ambulatory Visit (HOSPITAL_COMMUNITY): Payer: 59 | Admitting: Anesthesiology

## 2011-10-20 ENCOUNTER — Encounter (HOSPITAL_COMMUNITY): Payer: Self-pay | Admitting: Anesthesiology

## 2011-10-20 ENCOUNTER — Encounter (HOSPITAL_COMMUNITY)
Admission: RE | Disposition: A | Discharge status: Home / Self Care | Payer: Self-pay | Source: Ambulatory Visit | Attending: Surgery

## 2011-10-20 ENCOUNTER — Encounter (HOSPITAL_COMMUNITY): Payer: Self-pay | Admitting: *Deleted

## 2011-10-20 ENCOUNTER — Observation Stay (HOSPITAL_COMMUNITY)
Admission: RE | Admit: 2011-10-20 | Discharge: 2011-10-21 | DRG: 621 | Disposition: A | Discharge status: Home / Self Care | Payer: 59 | Source: Ambulatory Visit | Attending: Surgery | Admitting: Surgery

## 2011-10-20 DIAGNOSIS — G4733 Obstructive sleep apnea (adult) (pediatric): Secondary | ICD-10-CM | POA: Insufficient documentation

## 2011-10-20 DIAGNOSIS — Z6841 Body Mass Index (BMI) 40.0 and over, adult: Secondary | ICD-10-CM

## 2011-10-20 DIAGNOSIS — J45909 Unspecified asthma, uncomplicated: Secondary | ICD-10-CM | POA: Insufficient documentation

## 2011-10-20 DIAGNOSIS — H5440 Blindness, one eye, unspecified eye: Secondary | ICD-10-CM | POA: Insufficient documentation

## 2011-10-20 DIAGNOSIS — K7689 Other specified diseases of liver: Secondary | ICD-10-CM | POA: Insufficient documentation

## 2011-10-20 DIAGNOSIS — I10 Essential (primary) hypertension: Secondary | ICD-10-CM

## 2011-10-20 DIAGNOSIS — E785 Hyperlipidemia, unspecified: Secondary | ICD-10-CM | POA: Insufficient documentation

## 2011-10-20 HISTORY — PX: LAPAROSCOPIC GASTRIC BANDING: SHX1100

## 2011-10-20 LAB — CREATININE, SERUM: GFR calc Af Amer: >90 mL/min (ref >90)

## 2011-10-20 LAB — CBC
HCT: 40.7 % (ref 36.0–46.0)
MCH: 30.8 pg (ref 26.0–34.0)
MCHC: 33.7 g/dL (ref 30.0–36.0)
MCV: 91.5 fL (ref 78.0–100.0)
Platelets: 463 10*3/uL — ABNORMAL HIGH (ref 150–400)
RDW: 13.5 % (ref 11.5–15.5)

## 2011-10-20 SURGERY — GASTRIC BANDING, LAPAROSCOPIC
Anesthesia: General | Site: Abdomen | Wound class: Clean

## 2011-10-20 MED ORDER — LACTATED RINGERS IV SOLN
INTRAVENOUS | Status: DC
  Administered 2011-10-20: 15:00:00 via INTRAVENOUS
  Administered 2011-10-20: 1000 mL via INTRAVENOUS

## 2011-10-20 MED ORDER — GLYCOPYRROLATE 0.2 MG/ML IJ SOLN
INTRAMUSCULAR | Status: DC | PRN
  Administered 2011-10-20: .6 mg via INTRAVENOUS

## 2011-10-20 MED ORDER — OLOPATADINE HCL 0.6 % NA SOLN
2.0000 | Freq: Every day | NASAL | Status: DC
  Administered 2011-10-21: 2 via NASAL

## 2011-10-20 MED ORDER — SODIUM CHLORIDE 0.9 % IJ SOLN
INTRAMUSCULAR | Status: DC | PRN
  Administered 2011-10-20: 10 mL

## 2011-10-20 MED ORDER — DROPERIDOL 2.5 MG/ML IJ SOLN
INTRAMUSCULAR | Status: DC | PRN
  Administered 2011-10-20: 0.625 mg via INTRAVENOUS

## 2011-10-20 MED ORDER — HYDROMORPHONE HCL PF 1 MG/ML IJ SOLN: 0.2500 mg | INTRAMUSCULAR | Status: DC | PRN

## 2011-10-20 MED ORDER — BUPIVACAINE LIPOSOME 1.3 % IJ SUSP
20.0000 mL | Freq: Once | INTRAMUSCULAR | Status: AC
  Administered 2011-10-20: 20 mL
  Filled 2011-10-20: qty 20

## 2011-10-20 MED ORDER — ACETAMINOPHEN 10 MG/ML IV SOLN
INTRAVENOUS | Status: DC | PRN
  Administered 2011-10-20: 1000 mg via INTRAVENOUS

## 2011-10-20 MED ORDER — FLUTICASONE PROPIONATE HFA 44 MCG/ACT IN AERO
1.0000 | INHALATION_SPRAY | Freq: Two times a day (BID) | RESPIRATORY_TRACT | Status: DC
  Administered 2011-10-20 – 2011-10-21 (×2): 1 via RESPIRATORY_TRACT
  Filled 2011-10-20: qty 10.6

## 2011-10-20 MED ORDER — MORPHINE SULFATE 2 MG/ML IJ SOLN
2.0000 mg | INTRAMUSCULAR | Status: DC | PRN
  Administered 2011-10-20: 2 mg via INTRAVENOUS
  Filled 2011-10-20: qty 1

## 2011-10-20 MED ORDER — SUFENTANIL CITRATE 50 MCG/ML IV SOLN
INTRAVENOUS | Status: DC | PRN
  Administered 2011-10-20 (×4): 10 ug via INTRAVENOUS
  Administered 2011-10-20 (×2): 5 ug via INTRAVENOUS

## 2011-10-20 MED ORDER — LIDOCAINE HCL (CARDIAC) 20 MG/ML IV SOLN
INTRAVENOUS | Status: DC | PRN
  Administered 2011-10-20: 50 mg via INTRAVENOUS

## 2011-10-20 MED ORDER — PHENYLEPHRINE HCL 10 MG/ML IJ SOLN
INTRAMUSCULAR | Status: DC | PRN
  Administered 2011-10-20 (×2): 80 ug via INTRAVENOUS

## 2011-10-20 MED ORDER — MIDAZOLAM HCL 5 MG/5ML IJ SOLN
INTRAMUSCULAR | Status: DC | PRN
  Administered 2011-10-20 (×2): 1 mg via INTRAVENOUS

## 2011-10-20 MED ORDER — ALBUTEROL SULFATE HFA 108 (90 BASE) MCG/ACT IN AERS
INHALATION_SPRAY | RESPIRATORY_TRACT | Status: AC
  Filled 2011-10-20: qty 6.7

## 2011-10-20 MED ORDER — HEPARIN SODIUM (PORCINE) 5000 UNIT/ML IJ SOLN
INTRAMUSCULAR | Status: AC
  Administered 2011-10-20: 5000 [IU] via SUBCUTANEOUS
  Filled 2011-10-20: qty 1

## 2011-10-20 MED ORDER — ONDANSETRON HCL 4 MG/2ML IJ SOLN: 4.0000 mg | INTRAMUSCULAR | Status: DC | PRN

## 2011-10-20 MED ORDER — NEOSTIGMINE METHYLSULFATE 1 MG/ML IJ SOLN
INTRAMUSCULAR | Status: DC | PRN
  Administered 2011-10-20: 4 mg via INTRAVENOUS

## 2011-10-20 MED ORDER — PROPOFOL 10 MG/ML IV EMUL
INTRAVENOUS | Status: DC | PRN
  Administered 2011-10-20: 200 mg via INTRAVENOUS
  Administered 2011-10-20: 30 mg via INTRAVENOUS

## 2011-10-20 MED ORDER — ACETAMINOPHEN 160 MG/5ML PO SOLN: 650.0000 mg | ORAL | Status: DC | PRN

## 2011-10-20 MED ORDER — MEPERIDINE HCL 50 MG/ML IJ SOLN: 6.2500 mg | INTRAMUSCULAR | Status: DC | PRN

## 2011-10-20 MED ORDER — LACTATED RINGERS IV SOLN: INTRAVENOUS | Status: DC

## 2011-10-20 MED ORDER — PROMETHAZINE HCL 25 MG/ML IJ SOLN: 6.2500 mg | INTRAMUSCULAR | Status: DC | PRN

## 2011-10-20 MED ORDER — DEXTROSE 5 % IV SOLN
2.0000 g | INTRAVENOUS | Status: AC
  Administered 2011-10-20: 2 g via INTRAVENOUS
  Filled 2011-10-20 (×2): qty 2

## 2011-10-20 MED ORDER — UNJURY VANILLA POWDER: 2.0000 [oz_av] | Freq: Four times a day (QID) | ORAL | Status: DC

## 2011-10-20 MED ORDER — BUPIVACAINE LIPOSOME 1.3 % IJ SUSP: INTRAMUSCULAR | Status: DC | PRN

## 2011-10-20 MED ORDER — HEPARIN SODIUM (PORCINE) 5000 UNIT/ML IJ SOLN
5000.0000 [IU] | INTRAMUSCULAR | Status: AC
  Administered 2011-10-20: 5000 [IU] via SUBCUTANEOUS

## 2011-10-20 MED ORDER — SUCCINYLCHOLINE CHLORIDE 20 MG/ML IJ SOLN
INTRAMUSCULAR | Status: DC | PRN
  Administered 2011-10-20: 100 mg via INTRAVENOUS

## 2011-10-20 MED ORDER — DEXAMETHASONE SODIUM PHOSPHATE 10 MG/ML IJ SOLN
INTRAMUSCULAR | Status: DC | PRN
  Administered 2011-10-20: 10 mg via INTRAVENOUS

## 2011-10-20 MED ORDER — UNJURY CHICKEN SOUP POWDER: 2.0000 [oz_av] | Freq: Four times a day (QID) | ORAL | Status: DC

## 2011-10-20 MED ORDER — LIDOCAINE HCL 4 % MT SOLN
OROMUCOSAL | Status: DC | PRN
  Administered 2011-10-20: 4 mL via TOPICAL

## 2011-10-20 MED ORDER — ROCURONIUM BROMIDE 100 MG/10ML IV SOLN
INTRAVENOUS | Status: DC | PRN
  Administered 2011-10-20: 40 mg via INTRAVENOUS

## 2011-10-20 MED ORDER — LISINOPRIL 20 MG PO TABS
20.0000 mg | ORAL_TABLET | Freq: Every day | ORAL | Status: DC
  Administered 2011-10-21: 20 mg via ORAL
  Filled 2011-10-20 (×2): qty 1

## 2011-10-20 MED ORDER — ONDANSETRON HCL 4 MG/2ML IJ SOLN
INTRAMUSCULAR | Status: DC | PRN
  Administered 2011-10-20: 4 mg via INTRAVENOUS

## 2011-10-20 MED ORDER — 0.9 % SODIUM CHLORIDE (POUR BTL) OPTIME
TOPICAL | Status: DC | PRN
  Administered 2011-10-20: 1000 mL

## 2011-10-20 MED ORDER — MOMETASONE FURO-FORMOTEROL FUM 100-5 MCG/ACT IN AERO
2.0000 | INHALATION_SPRAY | Freq: Two times a day (BID) | RESPIRATORY_TRACT | Status: DC
  Administered 2011-10-20 – 2011-10-21 (×2): 2 via RESPIRATORY_TRACT
  Filled 2011-10-20: qty 13

## 2011-10-20 MED ORDER — HEPARIN SODIUM (PORCINE) 5000 UNIT/ML IJ SOLN
5000.0000 [IU] | Freq: Three times a day (TID) | INTRAMUSCULAR | Status: DC
  Administered 2011-10-20 – 2011-10-21 (×2): 5000 [IU] via SUBCUTANEOUS
  Filled 2011-10-20 (×5): qty 1

## 2011-10-20 MED ORDER — OXYCODONE-ACETAMINOPHEN 5-325 MG/5ML PO SOLN
5.0000 mL | ORAL | Status: DC | PRN
  Administered 2011-10-21: 10 mL via ORAL
  Filled 2011-10-20: qty 10

## 2011-10-20 MED ORDER — UNJURY CHOCOLATE CLASSIC POWDER
2.0000 [oz_av] | Freq: Four times a day (QID) | ORAL | Status: DC
  Administered 2011-10-21: 2 [oz_av] via ORAL

## 2011-10-20 MED ORDER — KCL IN DEXTROSE-NACL 20-5-0.45 MEQ/L-%-% IV SOLN
INTRAVENOUS | Status: DC
  Administered 2011-10-20 – 2011-10-21 (×2): via INTRAVENOUS
  Filled 2011-10-20 (×4): qty 1000

## 2011-10-20 MED ORDER — ACETAMINOPHEN 10 MG/ML IV SOLN
INTRAVENOUS | Status: AC
  Filled 2011-10-20: qty 100

## 2011-10-20 MED ORDER — MONTELUKAST SODIUM 10 MG PO TABS
10.0000 mg | ORAL_TABLET | Freq: Every day | ORAL | Status: DC
  Filled 2011-10-20 (×2): qty 1

## 2011-10-20 MED ORDER — KCL IN DEXTROSE-NACL 20-5-0.45 MEQ/L-%-% IV SOLN
INTRAVENOUS | Status: AC
  Filled 2011-10-20: qty 1000

## 2011-10-20 SURGICAL SUPPLY — 68 items
APL SKNCLS STERI-STRIP NONHPOA (GAUZE/BANDAGES/DRESSINGS)
BAND LAP 10.0 W/TUBES (Band) ×2 IMPLANT
BENZOIN TINCTURE PRP APPL 2/3 (GAUZE/BANDAGES/DRESSINGS) IMPLANT
BLADE HEX COATED 2.75 (ELECTRODE) ×2 IMPLANT
BLADE SURG 15 STRL LF DISP TIS (BLADE) ×1 IMPLANT
BLADE SURG 15 STRL SS (BLADE) ×2
CANISTER SUCTION 2500CC (MISCELLANEOUS) ×2 IMPLANT
CLOTH BEACON ORANGE TIMEOUT ST (SAFETY) ×2 IMPLANT
COVER SURGICAL LIGHT HANDLE (MISCELLANEOUS) ×2 IMPLANT
DECANTER SPIKE VIAL GLASS SM (MISCELLANEOUS) ×4 IMPLANT
DEVICE SUT QUICK LOAD TK 5 (STAPLE) ×6 IMPLANT
DEVICE SUT TI-KNOT TK 5X26 (MISCELLANEOUS) ×2 IMPLANT
DEVICE SUTURE ENDOST 10MM (ENDOMECHANICALS) IMPLANT
DISSECTOR BLUNT TIP ENDO 5MM (MISCELLANEOUS) IMPLANT
DRAPE CAMERA CLOSED 9X96 (DRAPES) ×2 IMPLANT
ELECT REM PT RETURN 9FT ADLT (ELECTROSURGICAL) ×2
ELECTRODE REM PT RTRN 9FT ADLT (ELECTROSURGICAL) ×1 IMPLANT
GLOVE BIOGEL M 8.0 STRL (GLOVE) ×2 IMPLANT
GLOVE BIOGEL PI IND STRL 7.0 (GLOVE) ×1 IMPLANT
GLOVE BIOGEL PI IND STRL 7.5 (GLOVE) IMPLANT
GLOVE BIOGEL PI INDICATOR 7.0 (GLOVE) ×2
GLOVE BIOGEL PI INDICATOR 7.5 (GLOVE) ×1
GLOVE SS BIOGEL STRL SZ 7.5 (GLOVE) IMPLANT
GLOVE SUPERSENSE BIOGEL SZ 7.5 (GLOVE) ×1
GLOVE SURG SS PI 6.5 STRL IVOR (GLOVE) ×3 IMPLANT
GOWN STRL NON-REIN LRG LVL3 (GOWN DISPOSABLE) ×4 IMPLANT
GOWN STRL REIN XL XLG (GOWN DISPOSABLE) ×4 IMPLANT
HOVERMATT SINGLE USE (MISCELLANEOUS) ×2 IMPLANT
KIT BASIN OR (CUSTOM PROCEDURE TRAY) ×2 IMPLANT
MESH HERNIA 1X4 RECT BARD (Mesh General) IMPLANT
MESH HERNIA BARD 1X4 (Mesh General) ×1 IMPLANT
NDL SPNL 22GX3.5 QUINCKE BK (NEEDLE) ×1 IMPLANT
NEEDLE SPNL 22GX3.5 QUINCKE BK (NEEDLE) ×2 IMPLANT
NS IRRIG 1000ML POUR BTL (IV SOLUTION) ×2 IMPLANT
PACK UNIVERSAL I (CUSTOM PROCEDURE TRAY) ×2 IMPLANT
PENCIL BUTTON HOLSTER BLD 10FT (ELECTRODE) ×2 IMPLANT
SCALPEL HARMONIC ACE (MISCELLANEOUS) IMPLANT
SET IRRIG TUBING LAPAROSCOPIC (IRRIGATION / IRRIGATOR) IMPLANT
SHEARS CURVED HARMONIC AC 45CM (MISCELLANEOUS) IMPLANT
SLEEVE ADV FIXATION 5X100MM (TROCAR) ×1 IMPLANT
SLEEVE Z-THREAD 5X100MM (TROCAR) IMPLANT
SOLUTION ANTI FOG 6CC (MISCELLANEOUS) ×2 IMPLANT
SPONGE GAUZE 4X4 12PLY (GAUZE/BANDAGES/DRESSINGS) ×2 IMPLANT
SPONGE LAP 18X18 X RAY DECT (DISPOSABLE) ×2 IMPLANT
STAPLER VISISTAT 35W (STAPLE) ×2 IMPLANT
STRIP CLOSURE SKIN 1/2X4 (GAUZE/BANDAGES/DRESSINGS) IMPLANT
SUT ETHIBOND 2 0 SH (SUTURE) ×6
SUT ETHIBOND 2 0 SH 36X2 (SUTURE) ×3 IMPLANT
SUT PROLENE 2 0 CT2 30 (SUTURE) ×2 IMPLANT
SUT SILK 0 (SUTURE) ×2
SUT SILK 0 30XBRD TIE 6 (SUTURE) IMPLANT
SUT SURGIDAC NAB ES-9 0 48 120 (SUTURE) IMPLANT
SUT VIC AB 2-0 SH 27 (SUTURE)
SUT VIC AB 2-0 SH 27X BRD (SUTURE) IMPLANT
SUT VIC AB 4-0 SH 18 (SUTURE) ×2 IMPLANT
SYR 20CC LL (SYRINGE) ×2 IMPLANT
SYR 30ML LL (SYRINGE) ×2 IMPLANT
SYS KII OPTICAL ACCESS 15MM (TROCAR) ×2
SYSTEM KII OPTICAL ACCESS 15MM (TROCAR) ×1 IMPLANT
TOWEL OR 17X26 10 PK STRL BLUE (TOWEL DISPOSABLE) ×4 IMPLANT
TROCAR ADV FIXATION 11X100MM (TROCAR) ×1 IMPLANT
TROCAR XCEL NON-BLD 11X100MML (ENDOMECHANICALS) ×2 IMPLANT
TROCAR Z-THREAD FIOS 11X100 BL (TROCAR) IMPLANT
TROCAR Z-THREAD FIOS 5X100MM (TROCAR) ×2 IMPLANT
TROCAR Z-THREAD SLEEVE 11X100 (TROCAR) ×1 IMPLANT
TUBE CALIBRATION ×1 IMPLANT
TUBE CALIBRATION LAPBAND (TUBING) ×2 IMPLANT
TUBING INSUFFLATION 10FT LAP (TUBING) ×2 IMPLANT

## 2011-10-20 NOTE — Anesthesia Preprocedure Evaluation (Addendum)
Anesthesia Evaluation  Patient identified by MRN, date of birth, ID band Patient awake    Reviewed: Allergy & Precautions, H&P , NPO status , Patient's Chart, lab work & pertinent test results  Airway Mallampati: II TM Distance: >3 FB Neck ROM: Full    Dental No notable dental hx.    Pulmonary neg pulmonary ROS, asthma , sleep apnea and Continuous Positive Airway Pressure Ventilation ,  breath sounds clear to auscultation  Pulmonary exam normal       Cardiovascular hypertension, Pt. on medications negative cardio ROS  Rhythm:Regular Rate:Normal     Neuro/Psych negative neurological ROS  negative psych ROS   GI/Hepatic negative GI ROS, Neg liver ROS,   Endo/Other  negative endocrine ROSMorbid obesity  Renal/GU negative Renal ROS  negative genitourinary   Musculoskeletal negative musculoskeletal ROS (+)   Abdominal   Peds negative pediatric ROS (+)  Hematology negative hematology ROS (+)   Anesthesia Other Findings   Reproductive/Obstetrics negative OB ROS                          Anesthesia Physical Anesthesia Plan  ASA: III  Anesthesia Plan: General   Post-op Pain Management:    Induction: Intravenous  Airway Management Planned: Oral ETT  Additional Equipment:   Intra-op Plan:   Post-operative Plan: Extubation in OR  Informed Consent: I have reviewed the patients History and Physical, chart, labs and discussed the procedure including the risks, benefits and alternatives for the proposed anesthesia with the patient or authorized representative who has indicated his/her understanding and acceptance.   Dental advisory given  Plan Discussed with: CRNA  Anesthesia Plan Comments: (No tylenol)        Anesthesia Quick Evaluation

## 2011-10-20 NOTE — H&P (Signed)
Chief Complaint: Morbid obesity BMI 43  History of Present Illness: Rebecca [name] is an 56 y.o. female who is the wife of [name] he used to I operated on around 2000 with severe Crohn's disease that after complications left him with a ileostomy. She has been to one of our seminars and is interested in lap band surgery. She is aware of the risk and benefits but has tried many things and has been unsuccessful in having sustained weight loss. She is followed by DrClovis Riley.  I discussed the lap band with her and gave her the booklet provided by [employer]. Her UGI was negative for GERD. Her ultrasound showed fatty infiltration of the liver. She is ready to undergo Lapband surgery and has been on the preop diet.  Past Medical History   Diagnosis  Date   .  Asthma    .  Hyperlipidemia    .  Hearing loss    .  Cough    .  Wheezing    .  Hypertension    .  Sleep apnea      uses c-pap   .  Blindness, legal      L EYE    Past Surgical History   Procedure  Date   .  Abdominal hysterectomy  2006   .  Eye surgery      Patient unsure of surgery date. Left eye   .  Knee surgery  1992     right   .  Breath tek h pylori  08/17/2011     Procedure: BREATH TEK H PYLORI; Surgeon: Valarie Merino, MD; Location: [facility]; Service: General; Laterality: N/A; PATIENT WILL COME AT 0715     (Not in a hospital admission)  Allergies   Allergen  Reactions   .  Hydrocodone  Hives, Shortness Of Breath and Itching   .  Sulfur  Itching and Rash    Family History   Problem  Relation  Age of Onset   .  Cancer  Mother       breast    .  Heart disease  Father     Social History: reports that she quit smoking about 17 years ago. Her smoking use included Cigarettes. She has a 7.5 pack-year smoking history. She has never used smokeless tobacco. She reports that she does not drink alcohol or use illicit drugs.  REVIEW OF SYSTEMS - PERTINENT POSITIVES ONLY:  Positive for asthma high cholesterol cough wheezing  hearing loss  Physical Exam:  Blood pressure 142/98, pulse 80, temperature 97.5 F (36.4 C), temperature source Temporal, resp. rate 18, height 5\' 2"  (1.575 m), weight 225 lb 6.4 oz (102.241 kg).  Body mass index is 41.23 kg/(m^2).  Gen: No acute distress. Well nourished and well groomed.  Neurological: Alert and oriented to person, place, and time. Coordination normal.  Head: Normocephalic and atraumatic.  Eyes: Conjunctivae are normal. Pupils are equal, round, and reactive to light. No scleral icterus.  Neck: Normal range of motion. Neck supple. No tracheal deviation or thyromegaly present.  Cardiovascular: SR without murmurs or gallops  Respiratory: Effort normal. No respiratory distress. No chest wall tenderness. Breath sounds normal. No wheezes, rales or rhonchi.  GI: Soft. Bowel sounds are normal. The abdomen is soft and nontender. There is no rebound and no guarding.  GU: No inguinal herniae Musculoskeletal: Normal range of motion. Extremities are nontender.  Lymphadenopathy: No cervical, preauricular, postauricular or axillary adenopathy is present Skin: Skin is warm and dry.  No rash noted. No diaphoresis. No erythema. No pallor. No clubbing, cyanosis, or edema.  Pscyh: Normal mood and affect. Behavior is normal. Judgment and thought content normal.  LABORATORY RESULTS:  No results found for this or any previous visit (from the past 48 hour(s)).  RADIOLOGY RESULTS:  No results found.  Problem List:  Active Problems:  * No active hospital problems. *   Assessment & Plan:  Morbid obesity with BMI of 43. Felt to be a good candidate for lapband. Proceed with lapband May 7th.  Matt B. Daphine Deutscher, MD, [facility]  [phone] beeper  [phone] There has been no change in the patient's past medical history or physical exam in the past 24 hours to the best of my knowledge. I examined the patient in the holding area and have made any changes to the history and  physical exam report that is included above.   Expectations and outcome results have been discussed with the patient to include risks and benefits.  All questions have been answered and we will proceed with previously discussed procedure noted and signed in the consent form in the patient's record.    Nahara Dona BMD FACS 1:50 PM  10/20/2011

## 2011-10-20 NOTE — Anesthesia Postprocedure Evaluation (Signed)
  Anesthesia Post-op Note  Patient: Rebecca [name]  Procedure(s) Performed: Procedure(s) (LRB): LAPAROSCOPIC GASTRIC BANDING (N/A)  Patient Location: PACU  Anesthesia Type: General  Level of Consciousness: awake and alert   Airway and Oxygen Therapy: Patient Spontanous Breathing  Post-op Pain: mild  Post-op Assessment: Post-op Vital signs reviewed, Patient's Cardiovascular Status Stable, Respiratory Function Stable, Patent Airway and No signs of Nausea or vomiting  Post-op Vital Signs: stable  Complications: No apparent anesthesia complications

## 2011-10-20 NOTE — Op Note (Signed)
@  DATE@ Surgeon: Wenda Low, MD, FACS Asst:  Jaclynn Guarneri, MD, FACS  Procedure: Laparoscopic adjustable gastric banding with APS  Anes:  General  EBL:  Minimal  Description of Procedure  The patient was taken to OR # 1 and given general anesthesia.  After a prep with PCMX the patient was draped and a timeout performed.  Access to the abdomen was achieved with a 0 degree Optiview technique through the left upper quadrant.    Adhesions were minimal.  Ports were placed to the the right of the midline including a 15 trocar in  the right upper quadrant placed obliquely.  The Satira Mccallum was used to retract the left lateral segment and the peritoneum was incised along the left crus.  The pars flaccida technique was utilized to insert the blunt "finger " dissector from right to left behind the stomach.    The lapband APS  Had been previously flushed and was inserted through the 15 trocar.  It was placed in the blunt dissector tip and pulled around behind the stomach.   The EJ junction as assessed for a hiatus hernia and none was found.  The band was plicated with 3 sutures of Surgidec and Ty Knots.  The tubing was brought out through the lower incision on the right and connected to the port which had mesh sewn onto the back and was placed into the subcutaneous pocket.   The patient was taken to the PACU in stable condition.    Matt B. Daphine Deutscher, MD, [facility], [place] [phone]

## 2011-10-20 NOTE — Progress Notes (Signed)
Patient does not wish to use nocturnal PPV tonight due to some abdominal discomfort and bloating. She would like to start passing gas prior to use. RT to follow up tomorrow for CPAP use. RN aware

## 2011-10-20 NOTE — Transfer of Care (Signed)
Immediate Anesthesia Transfer of Care Note  Patient: Rebecca [name]  Procedure(s) Performed: Procedure(s) (LRB): LAPAROSCOPIC GASTRIC BANDING (N/A)  Patient Location: PACU  Anesthesia Type: General  Level of Consciousness: awake, alert , oriented, patient cooperative and responds to stimulation  Airway & Oxygen Therapy: Patient Spontanous Breathing and Patient connected to face mask oxygen  Post-op Assessment: Report given to PACU RN, Post -op Vital signs reviewed and stable and Patient moving all extremities X 4  Post vital signs: stable  Complications: No apparent anesthesia complications

## 2011-10-21 ENCOUNTER — Inpatient Hospital Stay (HOSPITAL_COMMUNITY): Payer: 59

## 2011-10-21 ENCOUNTER — Other Ambulatory Visit (HOSPITAL_COMMUNITY): Payer: 59

## 2011-10-21 DIAGNOSIS — Z9889 Other specified postprocedural states: Secondary | ICD-10-CM

## 2011-10-21 LAB — DIFFERENTIAL
Eosinophils Absolute: 0 10*3/uL (ref 0.0–0.7)
Eosinophils Relative: 0 % (ref 0–5)
Lymphocytes Relative: 13 % (ref 12–46)
Lymphs Abs: 1.4 10*3/uL (ref 0.7–4.0)
Monocytes Absolute: 0.4 10*3/uL (ref 0.1–1.0)
Monocytes Relative: 4 % (ref 3–12)

## 2011-10-21 LAB — CBC
HCT: 39.3 % (ref 36.0–46.0)
MCH: 30.6 pg (ref 26.0–34.0)
MCV: 91.8 fL (ref 78.0–100.0)
Platelets: 434 10*3/uL — ABNORMAL HIGH (ref 150–400)
RBC: 4.28 MIL/uL (ref 3.87–5.11)
WBC: 10.8 10*3/uL — ABNORMAL HIGH (ref 4.0–10.5)

## 2011-10-21 MED ORDER — OXYCODONE-ACETAMINOPHEN 5-325 MG/5ML PO SOLN: 5.0000 mL | ORAL | Status: DC | PRN

## 2011-10-21 MED ORDER — IOHEXOL 300 MG/ML  SOLN: 50.0000 mL | Freq: Once | INTRAMUSCULAR | Status: DC | PRN

## 2011-10-21 NOTE — Discharge Summary (Signed)
Physician Discharge Summary  Patient ID: Rebecca [name] MRN: [id number] DOB/AGE: [id number] 58 y.o.  Admit date: 10/20/2011 Discharge date: 10/21/2011  Admission Diagnoses:  obesity  Discharge Diagnoses:  same  Active Problems:  * No active hospital problems. *    Surgery:  lapband aps system  Discharged Condition: improved  Hospital Course:   Had lapband and kept overnight.  UGI pending  Consults: none  Significant Diagnostic Studies: UGI pending    Discharge Exam: Blood pressure 150/83, pulse 76, temperature 97.8 F (36.6 C), temperature source Oral, resp. rate 18, height 5\' 2"  (1.575 m), weight 223 lb 6 oz (101.322 kg), SpO2 95.00%. Minimal pain.    Disposition: 06-Home-Health Care Svc  Discharge Orders    Future Appointments: Provider: Department: Dept Phone: Center:   11/03/2011 4:00 PM [facility] [phone] NDM   11/10/2011 2:30 PM Barbaraann Share, MD [facility] [phone] None   11/13/2011 4:50 PM Valarie Merino, MD [facility] [phone] None     Future Orders Please Complete By Expires   Diet - low sodium heart healthy      Increase activity slowly      No wound care        Medication List  As of 10/21/2011  9:21 AM   TAKE these medications         beclomethasone 80 MCG/ACT inhaler   Commonly known as: QVAR   Inhale 1 puff into the lungs daily at 12 noon.      diclofenac 75 MG EC tablet   Commonly known as: VOLTAREN   Take 75 mg by mouth every 8 (eight) hours as needed. For pain      DULERA IN   Inhale 2 puffs into the lungs 2 (two) times daily.      estradiol 0.025 mg/24hr   Commonly known as: CLIMARA - Dosed in mg/24 hr   Place 1 patch onto the skin every 7 (seven) days.      lisinopril 20 MG tablet   Commonly known as: PRINIVIL,ZESTRIL   Take 20 mg by mouth daily.      multivitamin with minerals tablet   Take 1 tablet by mouth daily with breakfast.      oxyCODONE-acetaminophen 5-325 MG/5ML  solution   Commonly known as: ROXICET   Take 5-10 mLs by mouth every 4 (four) hours as needed.      PATANASE 0.6 % Soln   Generic drug: Olopatadine HCl   Place 2 puffs into the nose daily with breakfast.      SINGULAIR 10 MG tablet   Generic drug: montelukast   Take 10 mg by mouth at bedtime.      VENTOLIN IN   Inhale 2 puffs into the lungs every 6 (six) hours as needed. For shortness of breath           Follow-up Information    Follow up with Valarie Merino, MD. (see at previously made appt)    Contact information:   [facility] [address] [place] [address] [phone]          Signed: Valarie Merino 10/21/2011, 9:21 AM

## 2011-10-21 NOTE — Discharge Instructions (Signed)
Gastric Banding Surgery Care After Refer to this sheet in the next few weeks. These discharge instructions provide you with general information on caring for yourself after you leave the hospital. Your caregiver may also give you specific instructions. Your treatment has been planned according to the most current medical practices available, but unavoidable complications sometimes occur. If you have any problems or questions after discharge, call your caregiver. HOME CARE INSTRUCTIONS  Activity  Take frequent walks throughout the day. This will help to prevent blood clots. Do not sit for longer than 45 minutes to 1 hour while awake for 4 to 6 weeks after surgery.   Continue to do coughing and deep breathing exercises once you get home. This will help to prevent pneumonia.   Do not do strenuous activities, such as heavy lifting, pushing, or pulling, until after your follow-up visit with your caregiver. Do not lift anything heavier than 10 lb (4.5 kg).   Talk with your caregiver about when you may return to work and your exercise routine.   Do not drive while taking prescription pain medicine.  Nutrition  It is very important that you drink at least 80 oz (2,400 mL) of fluid a day.   You should stay on a clear liquid diet until your follow-up visit with your caregiver. Keep sugar-free, clear liquid items on hand, including:   Tea: hot or cold. Drink only decaffeinated for the first month.   Broths: clear beef, chicken, vegetable.   Others: water, sugar-free frozen ice pops, flavored water, gelatin (after 1 week).   Do not consume caffeine for 1 month. Large amounts of caffeine can cause dehydration.   A dietician may also give you specific instructions.   Follow your caregiver's recommendations about vitamins and protein requirements after surgery.  Hygiene  You may shower and wash your hair 2 days after surgery. Pat incisions dry. Do not rub incisions with a washcloth or towel.    Follow your caregiver's recommendations about baths and pools following surgery.  Pain control  If a prescription medicine was given, follow your caregiver's directions.   You may feel some gas pain caused by the carbon dioxide used to inflate your abdomen during surgery. This pain can be felt in your chest, shoulder, back, or abdominal area. Moving around often is advised.  Incision care You may have 4 or more small incisions. They are closed with skin adhesive strips and have a clear plastic covering over them. You may remove your dressings the number of days directed by your caregiver after surgery. Check your incisions and surrounding area daily for any redness, swelling, discoloration, fluid (drainage), or bleeding. Dark red, dried blood may appear under these coverings. This is normal. SEEK MEDICAL CARE IF:   You develop persistent nausea and vomiting.   You have pain and discomfort with swallowing.   You have pain, swelling, or warmth in the lower extremities.   You have an oral temperature above 102 F (38.9 C).   You develop chills.   Your incision sites look red, swollen, or have drainage.   Your stool is black, tarry, or maroon in color.   You are lightheaded when standing.   You have any questions or concerns.  SEEK IMMEDIATE MEDICAL CARE IF:   You have chest pain.   You have severe calf pain or pain not relieved by medicine.   You develop shortness of breath or difficulty breathing.   You feel confused.   You have slurred speech.     You suddenly feel weak.  MAKE SURE YOU:   Understand these instructions.   Will watch your condition.   Will get help right away if you are not doing well or get worse.  Document Released: 01/14/2004 Document Revised: 05/21/2011 Document Reviewed: 10/22/2009 [employer] Patient Information 2012 [employer]. 

## 2011-10-21 NOTE — Progress Notes (Signed)
UGI results called to Dr. Daphine Deutscher; orders received to begin POD #1 diet and if tolerates well may disharge; Colin Mulders, RN aware of results and orders. Talmadge Chad, RN BAriatric Nurse Coordinator

## 2011-10-21 NOTE — Progress Notes (Signed)
Pt Rebecca [name]; alert and oriented; VSS; tolerating sips of water well; denies any nausea or vomiting; denies flatus; no BM; voiding without difficulty; ambulating in hallways without difficulty; c/o abdominal discomfort with relief from prn pain meds; has follow up appts with [facility] and [facility]; aware of BELT program and support group; discharge instructions given to pt and reviewed and pt verbalized understanding of. Awaiting UGI; doppler study negative. ADJUSTABLE GASTRIC BAND DISCHARGE INSTRUCTIONS  Drs. Fredrik Rigger, Hoxworth, Wilson, and Industry Call if you have any problems.   Call [phone] and ask for the surgeon on call.    If you need immediate assistance come to the ER at [facility]. Tell the ER personnel that you are a new post-op gastric banding patient. Signs and symptoms to report:   Severe vomiting or nausea. If you cannot tolerate clear liquids for longer than 1 day, you need to call your surgeon.    Abdominal pain which does not get better after taking your pain medication   Fever greater than 101 F degree   Difficulty breathing   Chest pain    Redness, swelling, drainage, or foul odor at incision sites    If your incisions open or pull apart   Swelling or pain in calf (lower leg)   Diarrhea, frequent watery, uncontrolled bowel movements.   Constipation, (no bowel movements for 3 days) if this occurs, Take Milk of Magnesia, 2 tablespoons by mouth, 3 times a day for 2 days if needed.  Call your doctor if constipation continues. Stop taking Milk of Magnesia once you have had a bowel movement. You may also use Miralax according to the label instructions.   Anything you consider "abnormal for you".   Normal side effects after Surgery:   Unable to sleep at night or concentrate   Irritability   Being tearful (crying) or depressed   These are common complaints, possibly related to your anesthesia, stress of surgery and change in lifestyle, that usually go away a few weeks after  surgery.  If these feelings continue, call your medical doctor.  Wound Care You may have surgical glue, steri-strips, or staples over your incisions after surgery.  Surgical glue:  Looks like a clear film over your incisions and will wear off gradually. Steri-strips: Strips of tape over your incisions. You may notice a yellowish color on the skin underneath the steri-strips. This is a substance used to make the steri-strips stick better. Do not pull the steri-strips off - let them fall off. Staples: Cherlynn Polo may be removed before you leave the hospital. If you go home with staples, call [facility] [phone]) for an appointment with your surgeon's nurse to have staples removed in 7 - 10 days. Showering: You may shower two days after your surgery unless otherwise instructed by your surgeon. Wash gently around wounds with warm soapy water, rinse well, and gently pat dry.  If you have a drain, you may need someone to hold this while you shower. Avoid tub baths until staples are removed and incisions are healed.    Medications   Medications should be liquid or crushed if larger than the size of a dime.  Extended release pills should not be crushed.   Depending on the size and number of medications you take, you may need to stagger/change the time you take your medications so that you do not over-fill your pouch.    Make sure you follow-up with your primary care physician to make medication adjustments needed during rapid weight  loss and life-style adjustment.   If you are diabetic, follow up with the doctor that prescribes your diabetes medication(s) within one week after surgery and check your blood sugar regularly.   Do not drive while taking narcotics!   Do not take acetaminophen (Tylenol) and Roxicet or Lortab Elixir at the same time since these pain medications contain acetaminophen.  Diet at home: (First 2 Weeks)  You will see the nutritionist two weeks after your surgery. She  will advance your diet if you are tolerating liquids well. Once at home, if you have severe vomiting or nausea and cannot tolerate clear liquids lasting longer than 1 day, call your surgeon.  For Same Day Surgery Discharge Patients: The day of surgery drink water only: 2 ounces every 4 hours. If you are tolerating water, begin drinking your high protein shake the next morning. For Overnight Stay Patients: Begin high protein shake 2 ounces every 3 hours, 5 - 6 times per day.  Gradually increase the amount you drink as tolerated.  You may find it easier to slowly sip shakes throughout the day.  It is important to get your proteins in first.   Protein Shake   Drink at least 2 ounces of shake 5-6 times per day   Each serving of protein shakes should have a minimum of 15 grams of protein and no more than 5 grams of carbohydrate    Increase the amount of protein shake you drink as tolerated   Protein powder may be added to fluids such as non-fat milk or Lactaid milk (limit to 20 grams added protein powder per serving   The initial goal is to drink at least 8 ounces of protein shake/drink per day (or as directed by the nutritionist). Some examples of protein shakes are [employer], [employer]illard[employer], EAS Edge HP, and Unjury. Hydration   Gradually increase the amount of water and other liquids as tolerated (See Acceptable Fluids)   Gradually increase the amount of protein shake as tolerated     Sip fluids slowly and throughout the day   May use Sugar substitutes, use sparingly (limit to 6 - 8 packets per day).  Your fluid goal is 64 ounces of fluid daily. It may take a few weeks to build up to this.         32 oz (or more) should be clear liquids and 32 oz (or more) should be full liquids.         Liquids should not contain sugar, caffeine, or carbonation!  Acceptable Fluids Clear Liquids:   Water or Sugar-free flavored water, Fruit H2O   Decaffeinated coffee or tea (sugar-free)   Crystal Lite,  Wyler's Lite, Minute Maid Lite   Sugar-free Jell-O   Bouillon or broth   Sugar-free Popsicle:   *Less than 20 calories each; Limit 1 per day   Full Liquids:              Protein Shakes/Drinks + 2 choices per day of other full liquids shown below.    Other full liquids must be: No more than 12 grams of Carbs per serving,  No more than 3 grams of Fat per serving   Strained low-fat cream soup   Non-Fat milk   Fat-free Lactaid Milk   Sugar-free yogurt (Dannon Lite & Fit) Vitamins and Minerals (Start 1 day after surgery unless otherwise directed)   1 Chewable Multivitamin / Multimineral Supplement (i.e. Centrum for Adults)   Chewable Calcium Citrate with Vitamin D-3. Take 1500  mg each day.           (Example: 3 Chewable Calcium Plus 600 with Vitamin D-3 can be found at [facility])           Do not mix multivitamins containing iron with calcium supplements; take 2 hours   apart   Do not substitute Tums (calcium carbonate) for your calcium   Menstruating women and those at risk for anemia may need extra iron. Talk with your doctor to see if you need additional iron.     If you need extra iron:  Total daily Iron recommendations (including Vitamins) = 50 - 100 mg Iron/day Do not stop taking or change any vitamins or minerals until you talk to your nutritionist or surgeon. Your nutritionist and / or physician must approve all vitamin and mineral supplements. Exercise For maximum success, begin exercising as soon as your doctor recommends. Make sure your physician approves any physical activity.   Depending on fitness level, begin with a simple walking program   Walk 5-15 minutes each day, 7 days per week.    Slowly increase until you are walking 30-45 minutes per day   Consider joining our BELT program. [phone] or email [email or web address] Things to remember:   You may have sexual relations when you feel comfortable. It is VERY important for female patients to use a reliable birth control method.  Fertility often increases after surgery. Do not get pregnant for at least 18 months.   It is very important to keep all follow up appointments with your surgeon, nutritionist, primary care physician, and behavioral health practitioner. After the first year, please follow up with your bariatric surgeon at least once a year in order to maintain best weight loss results.  [facility]: [phone] [facility]: [phone]   Free counseling is available for you and your family through collaboration between [facility] and [place]. Please call [phone] and leave a message.    Consider purchasing a medical alert bracelet that says you had lap-band surgery.    [facility] has a free Bariatric Surgery Support Group that meets monthly, the 3rd Thursday, 6 pm, Classroom #1, [place]. You may register online at [email or web address], but registration is not necessary. Select Classes and Support Groups, Bariatric Surgery, or Call [phone]   Do not return to work or drive until cleared by your surgeon   Use your CPAP when sleeping if applicable   Do not lift anything greater than ten pounds for at least two weeks.   You will probably have your first fill (fluid added to your band) 6 weeks after surgery  Talmadge Chad, RN BAriatric Nurse Coordinator

## 2011-10-21 NOTE — Progress Notes (Signed)
Patient ID: Rebecca [name], female   DOB: May 16, 1956, 56 y.o.   MRN: [id number] [facility] Progress Note:   1 Day Post-Op  Subjective: Mental status is clear Objective: Vital signs in last 24 hours: Temp:  [97.8 F (36.6 C)-98.4 F (36.9 C)] 97.8 F (36.6 C) (05/08 0554) Pulse Rate:  [65-92] 76  (05/08 0554) Resp:  [12-20] 18  (05/08 0554) BP: (114-165)/(58-87) 150/83 mmHg (05/08 0554) SpO2:  [94 %-98 %] 95 % (05/08 0827) Weight:  [223 lb 6 oz (101.322 kg)] 223 lb 6 oz (101.322 kg) (05/07 1750)  Intake/Output from previous day: 05/07 0701 - 05/08 0700 In: 2020 [P.O.:20; I.V.:2000] Out: 1800 [Urine:1800] Intake/Output this shift: Total I/O In: -  Out: 500 [Urine:500]  Physical Exam: Work of breathing is  normal  Lab Results:  Results for orders placed during the hospital encounter of 10/20/11 (from the past 48 hour(s))  HEMOGLOBIN AND HEMATOCRIT, BLOOD     Status: Normal   Collection Time   10/20/11  4:41 PM      Component Value Range Comment   Hemoglobin 12.5  12.0 - 15.0 (g/dL)    HCT 47.4  25.9 - 56.3 (%)   CBC     Status: Abnormal   Collection Time   10/20/11  6:43 PM      Component Value Range Comment   WBC 13.2 (*) 4.0 - 10.5 (K/uL)    RBC 4.45  3.87 - 5.11 (MIL/uL)    Hemoglobin 13.7  12.0 - 15.0 (g/dL)    HCT 87.5  64.3 - 32.9 (%)    MCV 91.5  78.0 - 100.0 (fL)    MCH 30.8  26.0 - 34.0 (pg)    MCHC 33.7  30.0 - 36.0 (g/dL)    RDW 51.8  84.1 - 66.0 (%)    Platelets 463 (*) 150 - 400 (K/uL)   CREATININE, SERUM     Status: Normal   Collection Time   10/20/11  6:43 PM      Component Value Range Comment   Creatinine, Ser 0.76  0.50 - 1.10 (mg/dL)    GFR calc non Af Amer >90  >90 (mL/min)    GFR calc Af Amer >90  >90 (mL/min)   CBC     Status: Abnormal   Collection Time   10/21/11  5:30 AM      Component Value Range Comment   WBC 10.8 (*) 4.0 - 10.5 (K/uL)    RBC 4.28  3.87 - 5.11 (MIL/uL)    Hemoglobin 13.1  12.0 - 15.0 (g/dL)    HCT 63.0  16.0 -  10.9 (%)    MCV 91.8  78.0 - 100.0 (fL)    MCH 30.6  26.0 - 34.0 (pg)    MCHC 33.3  30.0 - 36.0 (g/dL)    RDW 32.3  55.7 - 32.2 (%)    Platelets 434 (*) 150 - 400 (K/uL)   DIFFERENTIAL     Status: Abnormal   Collection Time   10/21/11  5:30 AM      Component Value Range Comment   Neutrophils Relative 84 (*) 43 - 77 (%)    Neutro Abs 9.1 (*) 1.7 - 7.7 (K/uL)    Lymphocytes Relative 13  12 - 46 (%)    Lymphs Abs 1.4  0.7 - 4.0 (K/uL)    Monocytes Relative 4  3 - 12 (%)    Monocytes Absolute 0.4  0.1 - 1.0 (K/uL)    Eosinophils Relative  0  0 - 5 (%)    Eosinophils Absolute 0.0  0.0 - 0.7 (K/uL)    Basophils Relative 0  0 - 1 (%)    Basophils Absolute 0.0  0.0 - 0.1 (K/uL)     Radiology/Results: No results found.  Anti-infectives: Anti-infectives     Start     Dose/Rate Route Frequency Ordered Stop   10/20/11 1111   cefOXitin (MEFOXIN) 2 g in dextrose 5 % 50 mL IVPB        2 g 100 mL/hr over 30 Minutes Intravenous 60 min pre-op 10/20/11 1111 10/20/11 1430          Assessment/Plan: Problem List: Patient Active Problem List  Diagnoses  . Obesity, Class III, BMI 40-49.9 (morbid obesity)  . OSA (obstructive sleep apnea)    Awaiting UGI.  If ok will discharge 1 Day Post-Op    LOS: 1 day   Matt B. Daphine Deutscher, MD, [facility] [phone] beeper [phone]  10/21/2011 9:16 AM

## 2011-10-21 NOTE — Progress Notes (Signed)
*  PRELIMINARY RESULTS* Vascular Ultrasound Lower Extremity Venous Duplex has been completed.  Preliminary findings: Bilaterally no evidence of DVT or baker's cyst.  Farrel Demark, RDMS 10/21/2011, 8:50 AM

## 2011-10-22 ENCOUNTER — Encounter (HOSPITAL_COMMUNITY): Payer: Self-pay | Admitting: Surgery

## 2011-10-22 ENCOUNTER — Telehealth (INDEPENDENT_AMBULATORY_CARE_PROVIDER_SITE_OTHER): Payer: Self-pay | Admitting: General Surgery

## 2011-10-22 NOTE — Telephone Encounter (Signed)
Pt calling with concerns about her sore throat.  She had surgery 2 days ago.  Voice sounded raspy.  Pt admits she is asthmatic and uses two inhalers.  Discussed ET tube irritation, which is very transient and should be resolved by now.  Suggested gargling with warm, salt water for comfort.  Consider appt with PCP if persistent sore throat.  She understands and will monitor through the weekend.

## 2011-11-03 ENCOUNTER — Encounter: Payer: 59 | Attending: Surgery | Admitting: *Deleted

## 2011-11-03 VITALS — Ht 62.0 in | Wt 215.4 lb

## 2011-11-03 DIAGNOSIS — Z01818 Encounter for other preprocedural examination: Secondary | ICD-10-CM | POA: Insufficient documentation

## 2011-11-03 DIAGNOSIS — Z713 Dietary counseling and surveillance: Secondary | ICD-10-CM | POA: Insufficient documentation

## 2011-11-04 ENCOUNTER — Encounter: Payer: Self-pay | Admitting: *Deleted

## 2011-11-04 NOTE — Progress Notes (Signed)
  Bariatric Class:  Appt start time: 1600 end time:  1700.  2 Week Post-Operative Nutrition Class  Patient was seen on 11/03/11 for Post-[facility]ative [facility]   Surgery date: 10/20/11 Surgery type: LAGB Start weight at [facility]: 236.2 lbs  Weight today: 215.4 lbs Weight change: 20.8 lbs Total weight lost: 20.8 lbs BMI: 39.4 kg/m^2  The following the learning objective met the patient during this course:   Identifies Phase 3A (Soft, High Proteins) Dietary Goals and will begin from 2 weeks post-operatively to 2 months post-operatively   Identifies appropriate sources of fluids and proteins   States protein recommendations and appropriate sources post-operatively  Identifies the need for appropriate texture modifications, mastication, and bite sizes when consuming solids  Identifies appropriate multivitamin and calcium sources post-operatively  Describes the need for physical activity post-operatively and will follow MD recommendations  States when to call healthcare provider regarding medication questions or post-operative complications  Handouts given during class include:  Phase 3A: Soft, High Protein Diet Handout  Band Fill Guidelines Handout  Follow-Up Plan: Patient will follow-up at [facility] in 6 weeks for 2 months post-op nutrition visit for diet advancement per MD.

## 2011-11-04 NOTE — Patient Instructions (Signed)
Patient to follow Phase 3A-Soft, High Protein Diet and follow-up at [facility] in 6 weeks for 2 months post-op nutrition visit for diet advancement. 

## 2011-11-10 ENCOUNTER — Ambulatory Visit: Payer: 59 | Admitting: Pulmonary Disease

## 2011-11-12 ENCOUNTER — Encounter (INDEPENDENT_AMBULATORY_CARE_PROVIDER_SITE_OTHER): Payer: 59 | Admitting: Surgery

## 2011-11-13 ENCOUNTER — Ambulatory Visit (INDEPENDENT_AMBULATORY_CARE_PROVIDER_SITE_OTHER): Payer: 59 | Admitting: Surgery

## 2011-11-13 ENCOUNTER — Encounter (INDEPENDENT_AMBULATORY_CARE_PROVIDER_SITE_OTHER): Payer: Self-pay | Admitting: Surgery

## 2011-11-13 VITALS — BP 134/82 | HR 74 | Temp 97.6°F | Resp 14 | Ht 62.0 in | Wt 214.5 lb

## 2011-11-13 DIAGNOSIS — Z9884 Bariatric surgery status: Secondary | ICD-10-CM

## 2011-11-13 NOTE — Progress Notes (Signed)
Rebecca [name] 56 y.o.  Body mass index is 39.23 kg/(m^2).  Patient Active Problem List  Diagnoses  . Obesity, Class III, BMI 40-49.9 (morbid obesity)  . OSA (obstructive sleep apnea)    Allergies  Allergen Reactions  . Hydrocodone Hives, Shortness Of Breath and Itching  . Sulfur Itching and Rash    Past Surgical History  Procedure Date  . Abdominal hysterectomy 2006  . Eye surgery     Patient unsure of surgery date. Left eye  . Knee surgery 1992    right  . Breath tek h pylori 08/17/2011    Procedure: BREATH TEK H PYLORI;  Surgeon: Valarie Merino, MD;  Location: [facility];  Service: General;  Laterality: N/A;  PATIENT WILL COME AT 0715  . Laparoscopic gastric banding 10/20/2011    Procedure: LAPAROSCOPIC GASTRIC BANDING;  Surgeon: Valarie Merino, MD;  Location: [facility];  Service: General;  Laterality: N/A;   Benita Stabile, MD, MD No diagnosis found.  3 weeks out from Lapband.  Incisions are fine.  Doing well  Will plan lapband fill in 3 weeks.   Matt B. Daphine Deutscher, MD, [facility] [phone] beeper [phone]  11/13/2011 5:03 PM

## 2011-11-13 NOTE — Patient Instructions (Signed)
Stay on low carb high protein diet

## 2011-11-18 DIAGNOSIS — Z9884 Bariatric surgery status: Secondary | ICD-10-CM

## 2011-11-18 HISTORY — DX: Bariatric surgery status: Z98.84

## 2011-11-30 ENCOUNTER — Ambulatory Visit (INDEPENDENT_AMBULATORY_CARE_PROVIDER_SITE_OTHER): Payer: 59 | Admitting: Pulmonary Disease

## 2011-11-30 ENCOUNTER — Encounter: Payer: Self-pay | Admitting: Pulmonary Disease

## 2011-11-30 VITALS — BP 118/90 | HR 82 | Temp 98.1°F | Ht 62.0 in | Wt 212.0 lb

## 2011-11-30 DIAGNOSIS — G4733 Obstructive sleep apnea (adult) (pediatric): Secondary | ICD-10-CM

## 2011-11-30 NOTE — Patient Instructions (Addendum)
Will set machine on automatic setting for next 2-3 weeks.  Will get download off machine to make sure is working properly.  If you are comfortable on this, can leave on this setting.  If not, can use download report to get you on optimal cpap pressure.  Work on weight loss If doing well, would like to see you back in 6mos.

## 2011-11-30 NOTE — Assessment & Plan Note (Signed)
The patient is doing well with CPAP for treatment of her obstructive sleep apnea.  She feels that her sleep is greatly improved, as is her daytime alertness.  We have yet to optimize her pressure, and I suspect that she is having breakthrough events in the late morning hours.  Will put her machine on the automatic setting for the next few weeks, and I will her know her optimal pressure.  She may be better served by the automatic mode indefinitely while she is losing weight over the upcoming months.  I will discuss this with her once I receive the download.  Otherwise, she is to follow up with me in 6 months.

## 2011-11-30 NOTE — Progress Notes (Signed)
  Subjective:    Patient ID: Rebecca [name], female    DOB: 1955-08-28, 56 y.o.   MRN: [id number]  HPI The patient comes in today for followup of her known obstructive sleep apnea.  She has been wearing CPAP compliantly by her download, feels that her sleep is much improved along with her daytime alertness.  She has not had any issues with her mask fit or pressure, but does awaken around 3 to 4 AM on occasion and feels like she has to pull the mask off.  I have reminded her that we have yet to optimize her pressure, and I'm wondering if she is having breakthrough apneas during her last REM period that is requiring a higher pressure.  The patient has also had her bariatric surgery since the last visit, and is scheduled to have her appliance inflated soon.     Review of Systems  Constitutional: Negative.  Negative for fever and unexpected weight change.  HENT: Negative.  Negative for ear pain, nosebleeds, congestion, sore throat, rhinorrhea, sneezing, trouble swallowing, dental problem, postnasal drip and sinus pressure.   Eyes: Negative.  Negative for redness and itching.  Respiratory: Negative.  Negative for cough, chest tightness, shortness of breath and wheezing.   Cardiovascular: Negative.  Negative for palpitations and leg swelling.  Gastrointestinal: Negative.  Negative for nausea and vomiting.  Genitourinary: Negative.  Negative for dysuria.  Musculoskeletal: Negative.  Negative for joint swelling.  Skin: Negative.  Negative for rash.  Neurological: Negative.  Negative for headaches.  Hematological: Negative.  Does not bruise/bleed easily.  Psychiatric/Behavioral: Negative.  Negative for dysphoric mood. The patient is not nervous/anxious.        Objective:   Physical Exam Obese female in no acute distress No skin breakdown or pressure necrosis from the CPAP mask Lower extremities with mild edema, no cyanosis Alert and oriented, does not appear sleepy, moves all 4  extremities.       Assessment & Plan:

## 2011-12-03 ENCOUNTER — Encounter (INDEPENDENT_AMBULATORY_CARE_PROVIDER_SITE_OTHER): Payer: 59

## 2011-12-10 ENCOUNTER — Ambulatory Visit (INDEPENDENT_AMBULATORY_CARE_PROVIDER_SITE_OTHER): Payer: 59 | Admitting: Physician Assistant

## 2011-12-10 ENCOUNTER — Encounter (INDEPENDENT_AMBULATORY_CARE_PROVIDER_SITE_OTHER): Payer: Self-pay | Admitting: Physician Assistant

## 2011-12-10 VITALS — BP 134/90 | HR 80 | Temp 97.9°F | Resp 14 | Ht 62.0 in | Wt 208.8 lb

## 2011-12-10 DIAGNOSIS — Z4651 Encounter for fitting and adjustment of gastric lap band: Secondary | ICD-10-CM

## 2011-12-10 NOTE — Progress Notes (Signed)
  HISTORY: Rebecca [name] is a 56 y.o.female who received an AP-Standard lap-band in May 2013 by Dr. Daphine Deutscher. She comes in with mild increases in hunger and portion sizes. No regurgitation or reflux.  VITAL SIGNS: Filed Vitals:   12/10/11 1044  BP: 134/90  Pulse: 80  Temp: 97.9 F (36.6 C)  Resp: 14    PHYSICAL EXAM: Physical exam reveals a very well-appearing 56 y.o.female in no apparent distress Neurologic: Awake, alert, oriented Psych: Bright affect, conversant Respiratory: Breathing even and unlabored. No stridor or wheezing Abdomen: Soft, nontender, nondistended to palpation. Incisions well-healed. No incisional hernias. Port easily palpated. Extremities: Atraumatic, good range of motion.  ASSESMENT: 56 y.o.  female  s/p AP-Standard lap-band.   PLAN: The patient's port was accessed with a 20G Huber needle without difficulty. Clear fluid was aspirated and 1 mL saline was added to the port. The patient was able to swallow water without difficulty following the procedure and was instructed to take clear liquids for the next 24-48 hours and advance slowly as tolerated.

## 2011-12-10 NOTE — Patient Instructions (Signed)
Take clear liquids tonight. Thin protein shakes are ok to start tomorrow morning. Slowly advance your diet thereafter. Call us if you have persistent vomiting or regurgitation, night cough or reflux symptoms. Return as scheduled or sooner if you notice no changes in hunger/portion sizes.  

## 2011-12-15 ENCOUNTER — Encounter: Payer: 59 | Attending: Surgery | Admitting: *Deleted

## 2011-12-15 ENCOUNTER — Encounter: Payer: Self-pay | Admitting: *Deleted

## 2011-12-15 VITALS — Ht 62.0 in | Wt 207.5 lb

## 2011-12-15 DIAGNOSIS — E66813 Obesity, class 3: Secondary | ICD-10-CM

## 2011-12-15 DIAGNOSIS — Z01818 Encounter for other preprocedural examination: Secondary | ICD-10-CM | POA: Insufficient documentation

## 2011-12-15 DIAGNOSIS — Z713 Dietary counseling and surveillance: Secondary | ICD-10-CM | POA: Insufficient documentation

## 2011-12-15 NOTE — Progress Notes (Signed)
Follow-up visit:  8 Weeks Post-Operative LAGB Surgery  Medical Nutrition Therapy:  Appt start time: 1500  End time:  1530.  Primary concerns today: Post-operative Bariatric Surgery Nutrition Management.  Surgery date: 10/20/11 Surgery type: LAGB Start weight at [facility]: 236.2 lbs  Weight today: 207.5 lbs Weight change: 7.9 lbs Total weight lost: 28.7 lbs BMI: 38.0 kg/m^2  Weight goal: 150 lbs % Weight goal met: 33%  TANITA  BODY COMP RESULTS  12/15/11   %Fat 49.2%   Fat Mass (lbs) 102.0   Fat Free Mass (lbs) 105.5   Total Body Water (lbs) 77.0   24-hr recall: B (AM): Boiled egg (2) OR 1 pc cheese S (AM): Protein shake (17g)  L (PM): Baked chicken (2-2.5 oz), green beans or squash/zucchini Snk (PM): Protein shake (17g) D (PM): Baked chicken (2-2.5 oz), green or pinto beans (1 cup) Snk (PM): 1 pc cheese  Fluid intake: 22 oz protein shakes, 16-32 oz water, ~14 oz decaf coffee = 50-65 oz Estimated total protein intake: 85-95 g  Medications: See medication list.  Supplementation: Taking MVI regularly; Not taking calcium regularly because it makes her gag. Discussed Celebrate Vitamins' calcium chew and Azyah states she will try them.   Using straws: No  Drinking while eating: No Hair loss: No Carbonated beverages: No N/V/D/C: Mild constipation reported Last Lap-Band fill:  First fill 12/10/11. Put her in green zone, but now feels more in yellow zone.   Recent physical activity:  Walk 20-30 min @ 4-5 days/week  Progress Towards Goal(s):  In progress.  Handouts given during visit include:  Phase 3B: High Protein + Non-Starchy Vegetables   Samples given during visit include:   Unjury Protein Powder Unflavored (2 ea):  Lot # [id number]; Exp: 08/14 Vanilla (1 ea): Lot # [id number]; Exp: 09/14 Strawberry (1 ea): Lot # [id number]; Exp: 08/14  Nutritional Diagnosis:  [place]-3.3 Overweight/obesity As related to recent LAGB surgery.  As evidenced by patient attempting to follow post-op  nutritional guidelines for continued weight loss.    Intervention:  Nutrition education.  Monitoring/Evaluation:  Dietary intake, exercise, lap band fills, and body weight. Follow up in 1 months for 3 month post-op visit.

## 2011-12-15 NOTE — Patient Instructions (Addendum)
Goals:  Follow Phase 3B: High Protein + Non-Starchy Vegetables  Eat 3-6 small meals/snacks, every 3-5 hrs  Increase lean protein foods to meet 60-80g goal  Increase fluid intake to 64oz +  Avoid drinking 15 minutes before, during and 30 minutes after eating  Aim for >30 min of physical activity daily 

## 2011-12-16 ENCOUNTER — Telehealth: Payer: Self-pay | Admitting: Pulmonary Disease

## 2011-12-16 NOTE — Telephone Encounter (Signed)
Spoke to [email or web address] and they are working with this pt about this issue Rebecca [name]

## 2011-12-16 NOTE — Telephone Encounter (Signed)
I spoke with pt and she states she spoke with millie from her DME and was very apologetic and now she does not want to change DME companies. Per Bjorn Loser do not have to send new order to get a sooner download off pt machine. She states to send over to [facility] and she will take of it. Pt is aware her DME will be calling to get download off her machine in a couple days and to call back if she has not heard from [place] after getting the download off her machine a couple days afterwards. Please advise [facility] thanks

## 2011-12-16 NOTE — Telephone Encounter (Signed)
I spoke with the pt and she has 2 complaints, the first is she is not happy with the auto setting on her cpap. She states that she does not feel like she is getting enough air on this setting and that she is taking her mask off several times during the night to get more air. She states Dr. Shelle Iron told her to call if she was having issues with the setting and he would change it. I see per last OV note  Dr. Shelle Iron stated he wanted a download after 2 weeks on auto to see what her optimal pressure was. I advised the pt I will call [facility] to see if they have done the download yet. Pt states understanding. I called [facility] and spoke with [facility] and she states the pt was set up on auto on 12-11-11 so it has not bee 2 weeks yet so they have not done a download. Millie was going to call the pt to discuss her complaints as well.   The patients other issue was that she is not happy with [facility] care. She states she has had several personality conflicts with their staff and that she always feels like they are rushing her when she calls with an issue and she wishes to change companies. I advised the pt that we can place an order to [facility] to see what will be covered by her insurance and we will let her know.   Dr. Shelle Iron please advise if you would like to go ahead and get a download even though is has not been 2 weeks on auto? Carron Curie, CMA

## 2011-12-16 NOTE — Telephone Encounter (Signed)
Lets go ahead and get the download in another few days so we have at least a week, then will change her over to fixed pressure.  Have her call us if she does not hear from [place] in 3-4 days after they get download and we can track down. Will have to talk with pcc about changing dme's

## 2012-01-13 ENCOUNTER — Telehealth (INDEPENDENT_AMBULATORY_CARE_PROVIDER_SITE_OTHER): Payer: Self-pay

## 2012-01-13 NOTE — Telephone Encounter (Signed)
Pt called to report sx of vaginal bleeding.  She is a previous lap band with Dr. Daphine Deutscher.  I advised her to call her PCP or GYN.  She seemed very confused about who to see for her symptoms.  I called Dr. Quita Skye office (who spoke with pt earlier today), to verify pt's complaint.  Evidently she explained UTI sx's to them and, that she had also called her GYN's office.  Caitlin, at Dr. Quita Skye instructed the pt to wait to hear back from her GYN and be seen by them for testing.

## 2012-01-14 ENCOUNTER — Encounter (INDEPENDENT_AMBULATORY_CARE_PROVIDER_SITE_OTHER): Payer: Self-pay

## 2012-01-14 ENCOUNTER — Encounter: Payer: Self-pay | Admitting: *Deleted

## 2012-01-14 ENCOUNTER — Ambulatory Visit (INDEPENDENT_AMBULATORY_CARE_PROVIDER_SITE_OTHER): Payer: 59 | Admitting: Physician Assistant

## 2012-01-14 ENCOUNTER — Encounter: Payer: 59 | Attending: Surgery | Admitting: *Deleted

## 2012-01-14 VITALS — BP 142/96 | HR 72 | Temp 97.6°F | Resp 18 | Ht 62.0 in | Wt 204.2 lb

## 2012-01-14 VITALS — Ht 62.0 in | Wt 204.5 lb

## 2012-01-14 DIAGNOSIS — Z713 Dietary counseling and surveillance: Secondary | ICD-10-CM | POA: Insufficient documentation

## 2012-01-14 DIAGNOSIS — Z4651 Encounter for fitting and adjustment of gastric lap band: Secondary | ICD-10-CM

## 2012-01-14 DIAGNOSIS — Z01818 Encounter for other preprocedural examination: Secondary | ICD-10-CM | POA: Insufficient documentation

## 2012-01-14 NOTE — Patient Instructions (Addendum)
Goals:  Follow Phase 3B: High Protein + Non-Starchy Vegetables  Eat 3-6 small meals/snacks, every 3-5 hrs  Increase lean protein foods to meet 60-80g goal  Increase fluid intake to 64oz +  Avoid drinking 15 minutes before, during and 30 minutes after eating  Aim for >30 min of physical activity daily 

## 2012-01-14 NOTE — Progress Notes (Signed)
Follow-up visit:  11 Weeks Post-Operative LAGB Surgery  Medical Nutrition Therapy:  Appt start time: 1030  End time:  1100.  Primary concerns today:  Post-operative Bariatric Surgery Nutrition Management.  Surgery date: 10/20/11 Surgery type: LAGB Start weight at [facility]: 236.2 lbs  Weight today: 204.5 lbs Weight change: 3.0 lbs Total weight lost: 31.7 lbs BMI: 37.4 kg/m^2  Weight goal: 150 lbs % Weight goal met: 37%  TANITA  BODY COMP RESULTS  12/15/11 01/14/12   %Fat 49.2% 49%   Fat Mass (lbs) 102.0 100.0   Fat Free Mass (lbs) 105.5 104.5   Total Body Water (lbs) 77.0 76.5   24-hr recall: B (AM): Boiled egg (2) OR 1 pc cheese S (AM): Protein shake (17g)  L (PM): Salad with baked chicken (2-3 oz), veggies (cucumbers) Snk (PM): Cucumbers D (PM): Baked chicken (2-3 oz), green or pinto beans (1 cup) Snk (PM): Protein shake   Fluid intake: 22 oz protein shakes, 16-32 oz water, ~14 oz decaf coffee = 50-65 oz Estimated total protein intake: 65 g  Medications: See medication list; No changes reported  Supplementation:  Reports taking regularly  Using straws: No  Drinking while eating: No Hair loss: No Carbonated beverages: No N/V/D/C: No Last Lap-Band fill:  Fill today (01/14/12). Unsure if she's in the green zone.  Recent physical activity:  Walk 20-30 min @ 5-6 days/week; Aerobics 1 day/week. Starts BELT program in a few weeks.   Progress Towards Goal(s):  In progress.  Nutritional Diagnosis:  [place]-3.3 Overweight/obesity As related to recent LAGB surgery.  As evidenced by patient attempting to follow post-op nutritional guidelines for continued weight loss.    Intervention:  Nutrition education.  Monitoring/Evaluation:  Dietary intake, exercise, lap band fills, and body weight. Follow up in 3 months for 6 month post-op visit.

## 2012-01-14 NOTE — Patient Instructions (Signed)
Take clear liquids tonight. Thin protein shakes are ok to start tomorrow morning. Slowly advance your diet thereafter. Call us if you have persistent vomiting or regurgitation, night cough or reflux symptoms. Return as scheduled or sooner if you notice no changes in hunger/portion sizes.  

## 2012-01-14 NOTE — Progress Notes (Signed)
  HISTORY: Rebecca [name] is a 56 y.o.female who received an AP-Standard lap-band in May 2013 by Dr. Daphine Deutscher. She comes in with 5 lbs of weight loss since her last visit. She had one episode of regurgitation after eating breakfast too quickly but no other episodes since. She's walking about 30 minutes daily and going to aerobics weekly. She's noticing a bit of increased hunger.  VITAL SIGNS: Filed Vitals:   01/14/12 0959  BP: 142/96  Pulse: 72  Temp: 97.6 F (36.4 C)  Resp: 18    PHYSICAL EXAM: Physical exam reveals a very well-appearing 56 y.o.female in no apparent distress Neurologic: Awake, alert, oriented Psych: Bright affect, conversant Respiratory: Breathing even and unlabored. No stridor or wheezing Abdomen: Soft, nontender, nondistended to palpation. Incisions well-healed. No incisional hernias. Port easily palpated. Extremities: Atraumatic, good range of motion.  ASSESMENT: 56 y.o.  female  s/p AP-Standard lap-band.   PLAN: The patient's port was accessed with a 20G Huber needle without difficulty. Clear fluid was aspirated and 0.5 mL saline was added to the port. The patient was able to swallow water without difficulty following the procedure and was instructed to take clear liquids for the next 24-48 hours and advance slowly as tolerated.

## 2012-02-18 ENCOUNTER — Encounter (INDEPENDENT_AMBULATORY_CARE_PROVIDER_SITE_OTHER): Payer: 59

## 2012-03-17 ENCOUNTER — Ambulatory Visit (INDEPENDENT_AMBULATORY_CARE_PROVIDER_SITE_OTHER): Payer: 59 | Admitting: Physician Assistant

## 2012-03-17 ENCOUNTER — Encounter (INDEPENDENT_AMBULATORY_CARE_PROVIDER_SITE_OTHER): Payer: Self-pay

## 2012-03-17 ENCOUNTER — Encounter (INDEPENDENT_AMBULATORY_CARE_PROVIDER_SITE_OTHER): Payer: 59

## 2012-03-17 VITALS — BP 132/84 | HR 82 | Ht 62.0 in | Wt 205.0 lb

## 2012-03-17 DIAGNOSIS — Z4651 Encounter for fitting and adjustment of gastric lap band: Secondary | ICD-10-CM

## 2012-03-17 NOTE — Patient Instructions (Signed)
Take clear liquids tonight. Thin protein shakes are ok to start tomorrow morning. Slowly advance your diet thereafter. Call us if you have persistent vomiting or regurgitation, night cough or reflux symptoms. Return as scheduled or sooner if you notice no changes in hunger/portion sizes.  

## 2012-03-17 NOTE — Progress Notes (Signed)
  HISTORY: Rebecca [name] is a 56 y.o.female who received an AP-Standard lap-band in May 2013 by Dr. Daphine Deutscher. She comes in 2 months after her last visit with a stable weight. She said her last fill controlled her hunger and portion sizes well for about three weeks then she noticed a progressive increase in both. Unfortunately she had deaths in the family in the intervening time so her diet has been affected by this. She denies persistent regurgitation or reflux. She would like a fill today.  VITAL SIGNS: Filed Vitals:   03/17/12 1306  BP: 132/84  Pulse: 82    PHYSICAL EXAM: Physical exam reveals a very well-appearing 56 y.o.female in no apparent distress Neurologic: Awake, alert, oriented Psych: Bright affect, conversant Respiratory: Breathing even and unlabored. No stridor or wheezing Abdomen: Soft, nontender, nondistended to palpation. Incisions well-healed. No incisional hernias. Port easily palpated. Extremities: Atraumatic, good range of motion.  ASSESMENT: 56 y.o.  female  s/p AP-Standard lap-band.   PLAN: The patient's port was accessed with a 20G Huber needle without difficulty. Clear fluid was aspirated and 0.5 mL saline was added to the port. The patient was able to swallow water without difficulty following the procedure and was instructed to take clear liquids for the next 24-48 hours and advance slowly as tolerated.

## 2012-04-15 ENCOUNTER — Ambulatory Visit: Payer: 59 | Admitting: *Deleted

## 2012-04-21 ENCOUNTER — Encounter (INDEPENDENT_AMBULATORY_CARE_PROVIDER_SITE_OTHER): Payer: 59

## 2012-04-28 ENCOUNTER — Ambulatory Visit (INDEPENDENT_AMBULATORY_CARE_PROVIDER_SITE_OTHER): Payer: 59 | Admitting: Physician Assistant

## 2012-04-28 ENCOUNTER — Encounter (INDEPENDENT_AMBULATORY_CARE_PROVIDER_SITE_OTHER): Payer: Self-pay

## 2012-04-28 VITALS — BP 134/74 | HR 77 | Temp 97.7°F | Resp 18 | Ht 62.0 in | Wt 205.6 lb

## 2012-04-28 DIAGNOSIS — Z4651 Encounter for fitting and adjustment of gastric lap band: Secondary | ICD-10-CM

## 2012-04-28 NOTE — Progress Notes (Signed)
  HISTORY: Rebecca [name] is a 57 y.o.female who received an AP-Standard lap-band in May 2013 by Dr. Daphine Deutscher. She comes in with daily regurgitation and intolerance of many solids. She admits to some maladaptive eating which may be the reason for weight stagnation.  VITAL SIGNS: Filed Vitals:   04/28/12 1437  BP: 134/74  Pulse: 77  Temp: 97.7 F (36.5 C)  Resp: 18    PHYSICAL EXAM: Physical exam reveals a very well-appearing 56 y.o.female in no apparent distress Neurologic: Awake, alert, oriented Psych: Bright affect, conversant Respiratory: Breathing even and unlabored. No stridor or wheezing Abdomen: Soft, nontender, nondistended to palpation. Incisions well-healed. No incisional hernias. Port easily palpated. Extremities: Atraumatic, good range of motion.  ASSESMENT: 56 y.o.  female  s/p AP-Standard lap-band.   PLAN: The patient's port was accessed with a 20G Huber needle without difficulty. Clear fluid was aspirated and 0.5 mL saline was removed from the band. We'll have her back in one month.

## 2012-04-28 NOTE — Patient Instructions (Signed)
Return in one month. Focus on good food choices as well as physical activity. Return sooner if you have an increase in hunger, portion sizes or weight. Return also for difficulty swallowing, night cough, reflux.   

## 2012-05-26 ENCOUNTER — Telehealth (INDEPENDENT_AMBULATORY_CARE_PROVIDER_SITE_OTHER): Payer: Self-pay

## 2012-05-26 NOTE — Telephone Encounter (Signed)
Returned pt's call regarding her lap band.  The pt called earlier to get a sooner LBF appt than her scheduled Feb appt.  I scheduled the pt for next Thursday 12/19 @ 245p in the lap band clinic.

## 2012-05-31 ENCOUNTER — Ambulatory Visit: Payer: 59 | Admitting: Pulmonary Disease

## 2012-06-02 ENCOUNTER — Ambulatory Visit (INDEPENDENT_AMBULATORY_CARE_PROVIDER_SITE_OTHER): Payer: 59 | Admitting: Physician Assistant

## 2012-06-02 ENCOUNTER — Encounter (INDEPENDENT_AMBULATORY_CARE_PROVIDER_SITE_OTHER): Payer: Self-pay

## 2012-06-02 VITALS — BP 124/78 | HR 72 | Temp 97.8°F | Resp 20 | Ht 62.0 in | Wt 210.2 lb

## 2012-06-02 DIAGNOSIS — Z4651 Encounter for fitting and adjustment of gastric lap band: Secondary | ICD-10-CM

## 2012-06-02 NOTE — Progress Notes (Signed)
  HISTORY: Rebecca [name] is a 56 y.o.female who received an AP-Standard lap-band in May 2013 by Dr. Daphine Deutscher. She comes in having had some fluid removed for obstructive symptoms and has gained almost 5 lbs. She has no further reflux or regurgiation.  VITAL SIGNS: Filed Vitals:   06/02/12 1430  BP: 124/78  Pulse: 72  Temp: 97.8 F (36.6 C)  Resp: 20    PHYSICAL EXAM: Physical exam reveals a very well-appearing 56 y.o.female in no apparent distress Neurologic: Awake, alert, oriented Psych: Bright affect, conversant Respiratory: Breathing even and unlabored. No stridor or wheezing Abdomen: Soft, nontender, nondistended to palpation. Incisions well-healed. No incisional hernias. Port easily palpated. Extremities: Atraumatic, good range of motion.  ASSESMENT: 56 y.o.  female  s/p AP-Standard lap-band.   PLAN: The patient's port was accessed with a 20G Huber needle without difficulty. Clear fluid was aspirated and 0.25 mL saline was added to the port. The patient was able to swallow water without difficulty following the procedure and was instructed to take clear liquids for the next 24-48 hours and advance slowly as tolerated.

## 2012-06-02 NOTE — Patient Instructions (Signed)
Take clear liquids tonight. Thin protein shakes are ok to start tomorrow morning. Slowly advance your diet thereafter. Call us if you have persistent vomiting or regurgitation, night cough or reflux symptoms. Return as scheduled or sooner if you notice no changes in hunger/portion sizes.  

## 2012-06-30 ENCOUNTER — Encounter (INDEPENDENT_AMBULATORY_CARE_PROVIDER_SITE_OTHER): Payer: 59

## 2012-07-28 ENCOUNTER — Encounter (INDEPENDENT_AMBULATORY_CARE_PROVIDER_SITE_OTHER): Payer: 59

## 2012-08-04 ENCOUNTER — Encounter (INDEPENDENT_AMBULATORY_CARE_PROVIDER_SITE_OTHER): Payer: 59

## 2012-08-09 ENCOUNTER — Encounter (INDEPENDENT_AMBULATORY_CARE_PROVIDER_SITE_OTHER): Payer: Self-pay | Admitting: Physician Assistant

## 2012-08-25 ENCOUNTER — Ambulatory Visit (INDEPENDENT_AMBULATORY_CARE_PROVIDER_SITE_OTHER): Payer: 59 | Admitting: Physician Assistant

## 2012-08-25 ENCOUNTER — Encounter (INDEPENDENT_AMBULATORY_CARE_PROVIDER_SITE_OTHER): Payer: Self-pay

## 2012-08-25 VITALS — BP 142/90 | HR 88 | Temp 97.2°F | Resp 20 | Ht 63.0 in | Wt 213.0 lb

## 2012-08-25 DIAGNOSIS — Z4651 Encounter for fitting and adjustment of gastric lap band: Secondary | ICD-10-CM

## 2012-08-25 NOTE — Patient Instructions (Signed)
Take clear liquids tonight. Thin protein shakes are ok to start tomorrow morning. Slowly advance your diet thereafter. Call us if you have persistent vomiting or regurgitation, night cough or reflux symptoms. Return as scheduled or sooner if you notice no changes in hunger/portion sizes.  

## 2012-08-25 NOTE — Progress Notes (Signed)
  HISTORY: Rebecca [name] is a 57 y.o.female who received an AP-Standard lap-band in May 2013 by Dr. Daphine Deutscher. She comes back in having gained almost six lbs since December. She's had three deaths in the family in the past couple of months which has taken a lot of her attention. She's noticed increased portion sizes and hunger. She has no complaints of persistent reflux or regurgitation.[name]  VITAL SIGNS: Filed Vitals:   08/25/12 1429  BP: 142/90  Pulse: 88  Temp: 97.2 F (36.2 C)  Resp: 20    PHYSICAL EXAM: Physical exam reveals a very well-appearing 57 y.o.female in no apparent distress Neurologic: Awake, alert, oriented Psych: Bright affect, conversant Respiratory: Breathing even and unlabored. No stridor or wheezing Abdomen: Soft, nontender, nondistended to palpation. Incisions well-healed. No incisional hernias. Port easily palpated. Extremities: Atraumatic, good range of motion.  ASSESMENT: 57 y.o.  female  s/p AP-Standard lap-band.   PLAN: The patient's port was accessed with a 20G Huber needle without difficulty. Clear fluid was aspirated and 0.25 mL saline was added to the port. The patient was able to swallow water without difficulty following the procedure and was instructed to take clear liquids for the next 24-48 hours and advance slowly as tolerated.

## 2012-09-22 ENCOUNTER — Encounter (INDEPENDENT_AMBULATORY_CARE_PROVIDER_SITE_OTHER): Payer: 59

## 2012-09-22 ENCOUNTER — Ambulatory Visit (INDEPENDENT_AMBULATORY_CARE_PROVIDER_SITE_OTHER): Payer: 59 | Admitting: Physician Assistant

## 2012-09-22 ENCOUNTER — Encounter (INDEPENDENT_AMBULATORY_CARE_PROVIDER_SITE_OTHER): Payer: Self-pay

## 2012-09-22 VITALS — BP 138/82 | HR 88 | Ht 62.0 in | Wt 217.2 lb

## 2012-09-22 DIAGNOSIS — Z4651 Encounter for fitting and adjustment of gastric lap band: Secondary | ICD-10-CM

## 2012-09-22 NOTE — Progress Notes (Signed)
  HISTORY: Rebecca [name] is a 57 y.o.female who received an AP-Standard lap-band in May 2013 by Dr. Daphine Deutscher. She comes in with 4 lbs weight gain. She has progressively gained weight since November 2013 after a small amount of fluid was removed for obstructive symptoms. She reports being able to eat a full dinner plate full of food in a 10 minute setting without regurgitation symptoms or reflux. She is also having difficult with sweets.  VITAL SIGNS: Filed Vitals:   09/22/12 1512  BP: 138/82  Pulse: 88    PHYSICAL EXAM: Physical exam reveals a very well-appearing 57 y.o.female in no apparent distress Neurologic: Awake, alert, oriented Psych: Bright affect, conversant Respiratory: Breathing even and unlabored. No stridor or wheezing Abdomen: Soft, nontender, nondistended to palpation. Incisions well-healed. No incisional hernias. Port easily palpated. Extremities: Atraumatic, good range of motion.  ASSESMENT: 57 y.o.  female  s/p AP-Standard lap-band.   PLAN: The patient's port was accessed with a 20G Huber needle without difficulty. 5 mL clear fluid was aspirated and 0.5 mL saline was added to the port to give a total predicted volume of 5.5 mL. The patient was able to swallow water without difficulty following the procedure and was instructed to take clear liquids for the next 24-48 hours and advance slowly as tolerated. We discussed how her eating sweets is counter to her overall goal of weight loss. I asked her to avoid them completely for the next month at least and to concentrate on proteins first, vegetables second followed by complex carbohydrates. I also recommended she reconnect with Rebecca [name] in nutrition to re-evaluate her overall diet. She voiced understanding and agreement. We'll have her back in one month.

## 2012-09-22 NOTE — Patient Instructions (Signed)
Take clear liquids tonight. Thin protein shakes are ok to start tomorrow morning. Slowly advance your diet thereafter. Call us if you have persistent vomiting or regurgitation, night cough or reflux symptoms. Return as scheduled or sooner if you notice no changes in hunger/portion sizes.  

## 2012-09-28 ENCOUNTER — Ambulatory Visit: Payer: 59 | Admitting: Pulmonary Disease

## 2012-10-13 ENCOUNTER — Encounter (INDEPENDENT_AMBULATORY_CARE_PROVIDER_SITE_OTHER): Payer: Self-pay

## 2012-10-13 ENCOUNTER — Ambulatory Visit (INDEPENDENT_AMBULATORY_CARE_PROVIDER_SITE_OTHER): Payer: 59 | Admitting: Physician Assistant

## 2012-10-13 VITALS — BP 130/82 | HR 84 | Temp 97.6°F | Resp 18 | Ht 62.0 in | Wt 212.8 lb

## 2012-10-13 DIAGNOSIS — Z4651 Encounter for fitting and adjustment of gastric lap band: Secondary | ICD-10-CM

## 2012-10-13 NOTE — Progress Notes (Signed)
  HISTORY: Rebecca [name] is a 57 y.o.female who received an AP-Standard lap-band in May 2013 by Dr. Daphine Deutscher. She comes in with a 3-day history of over-restriction symptoms. She said she was doing very well with smaller portion sizes and good satiety until a few days ago when she felt something get stuck as she tried to swallow. She reports having chewed adequately. She ended up regurgitating but since then she's having some difficulty with solid foods. She reports doing fine with liquids and softer foods like oatmeal. Also, she's walking regularly.  VITAL SIGNS: Filed Vitals:   10/13/12 0913  BP: 130/82  Pulse: 84  Temp: 97.6 F (36.4 C)  Resp: 18    PHYSICAL EXAM: Physical exam reveals a very well-appearing 57 y.o.female in no apparent distress Neurologic: Awake, alert, oriented Psych: Bright affect, conversant Respiratory: Breathing even and unlabored. No stridor or wheezing Abdomen: Soft, nontender, nondistended to palpation. Incisions well-healed. No incisional hernias. Port easily palpated. Extremities: Atraumatic, good range of motion.  ASSESMENT: 57 y.o.  female  s/p AP-Standard lap-band.   PLAN: The patient's port was accessed with a 20G Huber needle without difficulty. Clear fluid was aspirated and 0.25 mL saline was removed from the port to give a total predicted volume of 5.25 mL. The patient was advised to concentrate on healthy food choices and to avoid slider foods high in fats and carbohydrates. She was able to swallow water with ease after fluid removal.

## 2012-10-13 NOTE — Patient Instructions (Signed)
Return in one month. Focus on good food choices as well as physical activity. Return sooner if you have an increase in hunger, portion sizes or weight. Return also for difficulty swallowing, night cough, reflux.   

## 2012-10-17 ENCOUNTER — Ambulatory Visit (INDEPENDENT_AMBULATORY_CARE_PROVIDER_SITE_OTHER): Payer: 59 | Admitting: Pulmonary Disease

## 2012-10-17 ENCOUNTER — Encounter: Payer: Self-pay | Admitting: Pulmonary Disease

## 2012-10-17 VITALS — BP 112/82 | HR 87 | Temp 99.0°F | Ht 62.0 in | Wt 215.2 lb

## 2012-10-17 DIAGNOSIS — G4733 Obstructive sleep apnea (adult) (pediatric): Secondary | ICD-10-CM

## 2012-10-17 NOTE — Patient Instructions (Addendum)
Will get download off you machine, and call you with the results. Work on weight loss followup with me in one year if doing well.

## 2012-10-17 NOTE — Progress Notes (Signed)
  Subjective:    Patient ID: Rebecca [name], female    DOB: [birth year], 57 y.o.   MRN: [id number]  HPI Patient comes in today for followup of her obstructive sleep apnea.  She has been wearing CPAP compliantly, and is having no issues with her pressure or mask fit.  She feels it has made considerable difference to her sleep and daytime alertness, but most recently, she has had some increase in daytime sleepiness.  Her weight overall is stable from her last visit, and she has been keeping up with her mask changes.   Review of Systems  Constitutional: Negative for fever and unexpected weight change.  HENT: Negative for ear pain, nosebleeds, congestion, sore throat, rhinorrhea, sneezing, trouble swallowing, dental problem, postnasal drip and sinus pressure.   Eyes: Negative for redness and itching.  Respiratory: Negative for cough, chest tightness, shortness of breath and wheezing.   Cardiovascular: Negative for palpitations and leg swelling.  Gastrointestinal: Negative for nausea and vomiting.  Genitourinary: Negative for dysuria.  Musculoskeletal: Negative for joint swelling.  Skin: Negative for rash.  Neurological: Negative for headaches.  Hematological: Does not bruise/bleed easily.  Psychiatric/Behavioral: Negative for dysphoric mood. The patient is not nervous/anxious.        Objective:   Physical Exam Overweight female in no acute distress Nose without purulence or discharge noted No skin breakdown or pressure necrosis from the CPAP mask Neck without lymphadenopathy or thyromegaly Lower extremities with mild edema, no cyanosis Alert and oriented, moves all 4 extremities, does not appear to be sleepy.       Assessment & Plan:

## 2012-10-17 NOTE — Assessment & Plan Note (Signed)
The patient has done very well overall with CPAP, and feels that it has helped her sleep and daytime alertness.  She is having some breakthrough symptoms most recently, and we'll need to check her machine settings and also leak data.  I've also encouraged her to work aggressively on weight loss.  She will followup with me in 12 months if doing well.

## 2012-10-20 ENCOUNTER — Encounter (INDEPENDENT_AMBULATORY_CARE_PROVIDER_SITE_OTHER): Payer: 59

## 2012-10-21 ENCOUNTER — Telehealth: Payer: Self-pay | Admitting: Pulmonary Disease

## 2012-10-21 DIAGNOSIS — G4733 Obstructive sleep apnea (adult) (pediatric): Secondary | ICD-10-CM

## 2012-10-21 NOTE — Telephone Encounter (Signed)
I spoke with the pt and she is wanting to change [facility] to [facility] for cpap needs. Order placed. Carron Curie, CMA

## 2012-11-17 ENCOUNTER — Encounter (INDEPENDENT_AMBULATORY_CARE_PROVIDER_SITE_OTHER): Payer: 59

## 2012-12-24 ENCOUNTER — Telehealth: Payer: Self-pay | Admitting: Pulmonary Disease

## 2012-12-24 ENCOUNTER — Other Ambulatory Visit: Payer: Self-pay | Admitting: Pulmonary Disease

## 2012-12-24 DIAGNOSIS — G4733 Obstructive sleep apnea (adult) (pediatric): Secondary | ICD-10-CM

## 2012-12-24 NOTE — Telephone Encounter (Signed)
Please let pt know that I have looked at her download and her sleep apnea appears to be well controlled on cpap.  It is still on auto setting, and if she wanted to try 13cm of fixed pressure we could do that.  There was no leak data on the download, and if she is having mask leak issues this could worsen her sleep.  Let me know about fixed vs auto pressure.

## 2012-12-26 ENCOUNTER — Ambulatory Visit (INDEPENDENT_AMBULATORY_CARE_PROVIDER_SITE_OTHER): Payer: 59 | Admitting: Family Medicine

## 2012-12-26 ENCOUNTER — Encounter: Payer: Self-pay | Admitting: Family Medicine

## 2012-12-26 VITALS — BP 124/80 | Temp 98.3°F | Ht 61.75 in | Wt 209.0 lb

## 2012-12-26 DIAGNOSIS — I10 Essential (primary) hypertension: Secondary | ICD-10-CM

## 2012-12-26 DIAGNOSIS — J45909 Unspecified asthma, uncomplicated: Secondary | ICD-10-CM

## 2012-12-26 DIAGNOSIS — E785 Hyperlipidemia, unspecified: Secondary | ICD-10-CM

## 2012-12-26 DIAGNOSIS — G4733 Obstructive sleep apnea (adult) (pediatric): Secondary | ICD-10-CM

## 2012-12-26 DIAGNOSIS — Z7189 Other specified counseling: Secondary | ICD-10-CM

## 2012-12-26 DIAGNOSIS — Z7689 Persons encountering health services in other specified circumstances: Secondary | ICD-10-CM

## 2012-12-26 MED ORDER — LISINOPRIL 20 MG PO TABS: 20.0000 mg | ORAL_TABLET | Freq: Every day | ORAL | Status: DC

## 2012-12-26 NOTE — Telephone Encounter (Signed)
i called pt and she is fine with setting cpap on 13 i need an order to [facility] to do so thanks libby

## 2012-12-26 NOTE — Progress Notes (Signed)
Chief Complaint  Patient presents with  . Establish Care  . left leg pain    HPI:  Rebecca [name] is here to establish care. She has a history of morbid obesity with HTN, HLD and OSA s/p lapband in May 2013. She is followed by Dr. Daphine Deutscher and surgery s/p lap band and Dr. Shelle Iron in pulmonology for her asthma and OSA, whom she has seen recently. Sees Dr. Willa Rough in Allergy and asthma for her asthma - just seen recently and currently finishing a course of prednisone for an asthma flare. Used to see Dr. Clovis Riley but wanted female family doctor.  She reports has been about 1 year since had lipids checked and did have elevated cholesterol in the past. She had treated this with walking and working on diet. She is out of her lisinopril and needs a refill. Last PCP and physical: [facility] - had physical in august with pap and mammo and all normal. Leg pain - sudden last night in L leg, now resolved.  Has the following chronic problems and concerns today:  Patient Active Problem List   Diagnosis Date Noted  . Essential hypertension, benign 12/26/2012  . Unspecified asthma(493.90) 12/26/2012  . OSA (obstructive sleep apnea) 10/05/2011   ROS: See pertinent positives and negatives per HPI.  Past Medical History  Diagnosis Date  . Asthma   . Hyperlipidemia   . Hearing loss   . Hypertension   . Sleep apnea     uses c-pap  . Blindness, legal     L EYE  . Obesity, Class III, BMI 40-49.9 (morbid obesity) 06/03/2011  . Lapband May 2013 11/18/2011  . Migraines     Family History  Problem Relation Age of Onset  . Cancer Mother     breast  . Breast cancer Mother   . Alcohol abuse Father   . Heart disease Father 34    MI  . Hypertension Father     History   Social History  . Marital Status: Married    Spouse Name: N/A    Number of Children: N/A  . Years of Education: N/A   Social History Main Topics  . Smoking status: Former Smoker -- 0.50 packs/day for 15  years    Types: Cigarettes    Quit date: 06/02/1994  . Smokeless tobacco: Never Used  . Alcohol Use: No  . Drug Use: No  . Sexually Active: None   Other Topics Concern  . 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 For shortness of breath, Disp: , Rfl: ;  Calcium Citrate-Vitamin D (CALCIUM CITRATE + PO), Take 500 mg by mouth 3 (three) times daily., Disp: , Rfl: ;  lisinopril (PRINIVIL,ZESTRIL) 20 MG tablet, Take 1 tablet (20 mg total) by mouth daily., Disp: 90 tablet, Rfl: 3 Mometasone Furo-Formoterol Fum (DULERA IN), Inhale 2 puffs into the lungs 2 (two) times daily. , Disp: , Rfl: ;  Multiple Vitamins-Minerals (MULTIVITAMIN WITH MINERALS) tablet, Take 1 tablet by mouth daily with breakfast. , Disp: , Rfl: ;  Olopatadine HCl (PATANASE) 0.6 % SOLN, Place 2 puffs into the nose daily with breakfast. , Disp: , Rfl: ;  SINGULAIR 10 MG tablet, Take 20 mg by mouth at bedtime. , Disp: , Rfl:   EXAM:  Filed Vitals:   12/26/12 1430  BP: 124/80  Temp: 98.3 F (36.8 C)    Body mass index is 38.56 kg/(m^2).  GENERAL: vitals reviewed and listed above, alert, oriented, appears well hydrated and in no acute distress  HEENT: atraumatic, conjunttiva clear, no obvious abnormalities on inspection of external nose and ears  NECK: no obvious masses on inspection  LUNGS: clear to auscultation bilaterally, no wheezes, rales or rhonchi, good air movement  CV: HRRR, no peripheral edema  MS: moves all extremities without noticeable abnormality  PSYCH:  pleasant and cooperative, no obvious depression or anxiety  ASSESSMENT AND PLAN:  Discussed the following assessment and plan:  Essential hypertension, benign - Plan: lisinopril (PRINIVIL,ZESTRIL) 20 MG tablet, Basic metabolic panel, Hemoglobin A1c  Unspecified asthma(493.90)  OSA (obstructive sleep apnea)  Encounter to establish care  Hyperlipemia - Plan: Lipid Panel  -We reviewed the PMH, PSH, FH, SH, Meds and Allergies. -We provided refills for any medications we will prescribe as needed. -We addressed current concerns per orders and patient instructions. -We have asked for records for pertinent exams, studies, vaccines and notes from previous providers. -We have advised patient to follow up per instructions below. -has eaten and coming off steroids - so will return for fasting labs -she thinks had colonoscopy in last 10 years and normal and will check with Dr. Marcelle Overlie whom she says set that up   -Patient advised to return or notify a doctor immediately if symptoms worsen or persist or new concerns arise.  Patient Instructions  -We have ordered labs or studies at this visit. It can take up to 1-2 weeks for results and processing. We will contact you with instructions IF your results are abnormal. Normal results will be released to your [facility]. If you have not heard from [place] or can not find your results in [facility] in 2 weeks please contact our office.  -PLEASE SIGN UP FOR MYCHART TODAY   We recommend the following healthy lifestyle measures: - eat a healthy diet consisting of lots of vegetables, fruits, beans, nuts, seeds, healthy meats such as white chicken and fish and whole grains.  - avoid fried foods, fast food, processed foods, sodas, red meet and other fattening foods.  - get a least 150 minutes of aerobic exercise per week.   Please schedule lab appointment for the morning of the 29th and come in fasting for that.  Follow up in: 4-6 months      Chevez Sambrano  R.

## 2012-12-26 NOTE — Patient Instructions (Addendum)
-  We have ordered labs or studies at this visit. It can take up to 1-2 weeks for results and processing. We will contact you with instructions IF your results are abnormal. Normal results will be released to your [facility]. If you have not heard from [place] or can not find your results in [facility] in 2 weeks please contact our office.  -PLEASE SIGN UP FOR MYCHART TODAY   We recommend the following healthy lifestyle measures: - eat a healthy diet consisting of lots of vegetables, fruits, beans, nuts, seeds, healthy meats such as white chicken and fish and whole grains.  - avoid fried foods, fast food, processed foods, sodas, red meet and other fattening foods.  - get a least 150 minutes of aerobic exercise per week.   Please schedule lab appointment for the morning of the 29th and come in fasting for that.  Follow up in: 4-6 months

## 2012-12-27 ENCOUNTER — Other Ambulatory Visit: Payer: Self-pay | Admitting: Pulmonary Disease

## 2012-12-27 DIAGNOSIS — G4733 Obstructive sleep apnea (adult) (pediatric): Secondary | ICD-10-CM

## 2013-01-10 ENCOUNTER — Other Ambulatory Visit: Payer: 59

## 2013-01-11 ENCOUNTER — Other Ambulatory Visit (INDEPENDENT_AMBULATORY_CARE_PROVIDER_SITE_OTHER): Payer: 59

## 2013-01-11 DIAGNOSIS — E785 Hyperlipidemia, unspecified: Secondary | ICD-10-CM

## 2013-01-11 DIAGNOSIS — I10 Essential (primary) hypertension: Secondary | ICD-10-CM

## 2013-01-11 LAB — BASIC METABOLIC PANEL
BUN: 13 mg/dL (ref 6–23)
Chloride: 108 mEq/L (ref 96–112)
GFR: 101.6 mL/min (ref >60.00)
Potassium: 3.8 mEq/L (ref 3.5–5.1)
Sodium: 142 mEq/L (ref 135–145)

## 2013-01-11 LAB — HEMOGLOBIN A1C: Hgb A1c MFr Bld: 6.1 % (ref 4.6–6.5)

## 2013-01-11 LAB — LIPID PANEL
HDL: 36.7 mg/dL — ABNORMAL LOW (ref >39.00)
Total CHOL/HDL Ratio: 6
VLDL: 33 mg/dL (ref 0.0–40.0)

## 2013-01-11 LAB — LDL CHOLESTEROL, DIRECT: Direct LDL: 147.4 mg/dL

## 2013-01-12 NOTE — Progress Notes (Signed)
Quick Note:  Left a message for pt to return call. ______ 

## 2013-01-12 NOTE — Progress Notes (Signed)
Quick Note:  Called and spoke with pt and pt is aware. ______ 

## 2013-03-27 ENCOUNTER — Ambulatory Visit: Payer: 59 | Admitting: Internal Medicine

## 2013-04-20 ENCOUNTER — Encounter (INDEPENDENT_AMBULATORY_CARE_PROVIDER_SITE_OTHER): Payer: Self-pay

## 2013-04-20 ENCOUNTER — Ambulatory Visit (INDEPENDENT_AMBULATORY_CARE_PROVIDER_SITE_OTHER): Payer: 59 | Admitting: Physician Assistant

## 2013-04-20 VITALS — BP 140/80 | HR 92 | Temp 98.3°F | Resp 16 | Ht 62.0 in | Wt 216.0 lb

## 2013-04-20 DIAGNOSIS — Z4651 Encounter for fitting and adjustment of gastric lap band: Secondary | ICD-10-CM

## 2013-04-20 NOTE — Progress Notes (Signed)
  HISTORY: Rebecca [name] is a 57 y.o.female who received an AP-Standard lap-band in May 2013 by Dr. Daphine Deutscher. She comes in with 3 lbs weight gain since her last appointment in May. She is disappointed with her progress thus far but is eager to get back on track. She believes part of her weight issue is attributable to poor food choices but she also gives a history of overeating. She was able to eat a regular dinner portion of food for lunch yesterday. She did feel very full thereafter but had no regurgitation symptoms. She denies night cough.  VITAL SIGNS: Filed Vitals:   04/20/13 1004  BP: 140/80  Pulse: 92  Temp: 98.3 F (36.8 C)  Resp: 16    PHYSICAL EXAM: Physical exam reveals a very well-appearing 57 y.o.female in no apparent distress Neurologic: Awake, alert, oriented Psych: Bright affect, conversant Respiratory: Breathing even and unlabored. No stridor or wheezing Abdomen: Soft, nontender, nondistended to palpation. Incisions well-healed. No incisional hernias. Port easily palpated. Extremities: Atraumatic, good range of motion.  ASSESMENT: 58 y.o.  female  s/p AP-Standard lap-band.   PLAN: The patient's port was accessed with a 20G Huber needle without difficulty. Clear fluid was aspirated and 0.25 mL saline was added to the port to give a total predicted volume of 5.5 mL. The patient was able to swallow water without difficulty following the procedure and was instructed to take clear liquids for the next 24-48 hours and advance slowly as tolerated. She is going to set up an appointment with nutrition today. We'll have her back in three months.

## 2013-04-20 NOTE — Patient Instructions (Signed)

## 2013-04-27 ENCOUNTER — Encounter: Payer: Self-pay | Admitting: Family Medicine

## 2013-04-27 ENCOUNTER — Ambulatory Visit (INDEPENDENT_AMBULATORY_CARE_PROVIDER_SITE_OTHER): Payer: 59 | Admitting: Family Medicine

## 2013-04-27 VITALS — BP 124/84 | Temp 98.1°F | Wt 219.0 lb

## 2013-04-27 DIAGNOSIS — E785 Hyperlipidemia, unspecified: Secondary | ICD-10-CM

## 2013-04-27 DIAGNOSIS — R7309 Other abnormal glucose: Secondary | ICD-10-CM

## 2013-04-27 DIAGNOSIS — R7303 Prediabetes: Secondary | ICD-10-CM

## 2013-04-27 DIAGNOSIS — G4733 Obstructive sleep apnea (adult) (pediatric): Secondary | ICD-10-CM

## 2013-04-27 DIAGNOSIS — I10 Essential (primary) hypertension: Secondary | ICD-10-CM

## 2013-04-27 NOTE — Patient Instructions (Signed)
-  please get last colonoscopy report and have it sent to [place]  -schedule lab appointment on the way out  -We have ordered labs or studies at this visit. It can take up to 1-2 weeks for results and processing. We will contact you with instructions IF your results are abnormal. Normal results will be released to your [facility]. If you have not heard from [place] or can not find your results in [facility] in 2 weeks please contact our office.  We recommend the following healthy lifestyle measures: - eat a healthy diet consisting of lots of vegetables, fruits, beans, nuts, seeds, healthy meats such as white chicken and fish and whole grains.  - avoid fried foods, fast food, processed foods, sodas, red meet and other fattening foods.  - get a least 150 minutes of aerobic exercise per week.   -follow up in 4-6 months

## 2013-04-27 NOTE — Progress Notes (Signed)
Pre visit review using our clinic review tool, if applicable. No additional management support is needed unless otherwise documented below in the visit note. 

## 2013-04-27 NOTE — Progress Notes (Signed)
Chief Complaint  Patient presents with  . 4 month follow up    HPI:  Follow up: Reports she is doing well.  HTN: -takes lisinopril -stable -denies: CP, SOB for the most part, swelling  HLD: -working on lifestyle changes  Prediabetes: -working on lifestyle changes -going back to nutritionist to help with eating right -walking about 4 times per week 2 miles and exercises 1x at gym at church  Hx Morbid Obesity: -s/p lap band, followed by Dr. Daphine Deutscher band adjusted at recent visit per review of notes  OSA/Asthma: -followed by pulm  -thinks had colonoscopy in last 5 years but not sure of date and was normal   ROS: See pertinent positives and negatives per HPI.  Past Medical History  Diagnosis Date  . Asthma   . Hyperlipidemia   . Hearing loss   . Hypertension   . Sleep apnea     uses c-pap  . Blindness, legal     L EYE  . Obesity, Class III, BMI 40-49.9 (morbid obesity) 06/03/2011  . Lapband May 2013 11/18/2011  . Migraines     Past Surgical History  Procedure Laterality Date  . Eye surgery      Patient unsure of surgery date. Left eye  . Knee surgery  1992    right  . Breath tek h pylori  08/17/2011    Procedure: BREATH TEK H PYLORI;  Surgeon: Valarie Merino, MD;  Location: [facility];  Service: General;  Laterality: N/A;  PATIENT WILL COME AT 0715  . Laparoscopic gastric banding  10/20/2011    Procedure: LAPAROSCOPIC GASTRIC BANDING;  Surgeon: Valarie Merino, MD;  Location: [facility];  Service: General;  Laterality: N/A;  . Abdominal hysterectomy  2006    fibroids    Family History  Problem Relation Age of Onset  . Cancer Mother     breast  . Breast cancer Mother   . Alcohol abuse Father   . Heart disease Father 65    MI  . Hypertension Father     History   Social History  . Marital Status: Married    Spouse Name: N/A    Number of Children: N/A  . Years of Education: N/A   Social History Main Topics  . Smoking status: Former Smoker -- 0.50  packs/day for 15 years    Types: Cigarettes    Quit date: 06/02/1994  . Smokeless tobacco: Never Used  . Alcohol Use: No  . Drug Use: No  . Sexual Activity: None   Other Topics Concern  . 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 For shortness of breath, Disp: , Rfl: ;  Calcium Citrate-Vitamin D (CALCIUM CITRATE + PO), Take 500 mg by mouth 3 (three) times daily., Disp: , Rfl: ;  lisinopril (PRINIVIL,ZESTRIL) 20 MG tablet, Take 1 tablet (20 mg total) by mouth daily., Disp: 90 tablet, Rfl: 3 Mometasone Furo-Formoterol Fum (DULERA IN), Inhale 2 puffs  into the lungs 2 (two) times daily. , Disp: , Rfl: ;  Multiple Vitamins-Minerals (MULTIVITAMIN WITH MINERALS) tablet, Take 1 tablet by mouth daily with breakfast. , Disp: , Rfl: ;  SINGULAIR 10 MG tablet, Take 20 mg by mouth at bedtime. , Disp: , Rfl: ;  Olopatadine HCl (PATANASE) 0.6 % SOLN, Place 2 puffs into the nose daily with breakfast. , Disp: , Rfl:   EXAM:  Filed Vitals:   04/27/13 1548  BP: 124/84  Temp: 98.1 F (36.7 C)    Body mass index is 40.05 kg/(m^2).  GENERAL: vitals reviewed and listed above, alert, oriented, appears well hydrated and in no acute distress  HEENT: atraumatic, conjunttiva clear, no obvious abnormalities on inspection of external nose and ears  NECK: no obvious masses on inspection  LUNGS: clear to auscultation bilaterally, no wheezes, rales or rhonchi, good air movement  CV: HRRR, no peripheral edema  MS: moves all extremities without noticeable  abnormality  PSYCH: pleasant and cooperative, no obvious depression or anxiety  ASSESSMENT AND PLAN:  Discussed the following assessment and plan:  Essential hypertension, benign - Plan: Basic metabolic panel  Other and unspecified hyperlipidemia - Plan: Lipid Panel  Prediabetes - Plan: Hemoglobin A1c  Morbid obesity  OSA (obstructive sleep apnea)  -doing well -congratulated on exercise increase and encouraged continued work with nutrition and Careers adviser for weight loss -labs per orders -follow up 4-6 months  -Patient advised to return or notify a doctor immediately if symptoms worsen or persist or new concerns arise.  Patient Instructions  -please get last colonoscopy report and have it sent to [place]  -schedule lab appointment on the way out  -We have ordered labs or studies at this visit. It can take up to 1-2 weeks for results and processing. We will contact you with instructions IF your results are abnormal. Normal results will be released to your [facility]. If you have not heard from [place] or can not find your results in [facility] in 2 weeks please contact our office.  We recommend the following healthy lifestyle measures: - eat a healthy diet consisting of lots of vegetables, fruits, beans, nuts, seeds, healthy meats such as white chicken and fish and whole grains.  - avoid fried foods, fast food, processed foods, sodas, red meet and other fattening foods.  - get a least 150 minutes of aerobic exercise per week.   -follow up in 4-6 months            KIM, HANNAH R.

## 2013-06-01 ENCOUNTER — Telehealth (INDEPENDENT_AMBULATORY_CARE_PROVIDER_SITE_OTHER): Payer: Self-pay | Admitting: General Surgery

## 2013-06-01 DIAGNOSIS — Z9884 Bariatric surgery status: Secondary | ICD-10-CM

## 2013-06-01 NOTE — Telephone Encounter (Signed)
Message copied by Ignacia Marvel on Thu Jun 01, 2013  9:34 AM ------      Message from: Fatima Sanger      Created: Wed May 31, 2013  4:00 PM       Pt called requesting a referral back to the nutritionist.            Thank you,      Eulah Citizen ------

## 2013-06-01 NOTE — Telephone Encounter (Signed)
LMOM letting pt know that we put in the referral to the nutrition center.  Explained that they will get in touch with her personally to set up that appt.  Informed her to please give me a call if she has any questions.

## 2013-06-05 ENCOUNTER — Other Ambulatory Visit (INDEPENDENT_AMBULATORY_CARE_PROVIDER_SITE_OTHER): Payer: Self-pay

## 2013-07-20 ENCOUNTER — Encounter (INDEPENDENT_AMBULATORY_CARE_PROVIDER_SITE_OTHER): Payer: 59

## 2013-07-26 ENCOUNTER — Encounter: Payer: 59 | Attending: Surgery | Admitting: Dietician

## 2013-07-26 ENCOUNTER — Encounter: Payer: Self-pay | Admitting: Dietician

## 2013-07-26 VITALS — Ht 62.0 in | Wt 217.0 lb

## 2013-07-26 DIAGNOSIS — Z09 Encounter for follow-up examination after completed treatment for conditions other than malignant neoplasm: Secondary | ICD-10-CM | POA: Insufficient documentation

## 2013-07-26 DIAGNOSIS — Z9884 Bariatric surgery status: Secondary | ICD-10-CM | POA: Insufficient documentation

## 2013-07-26 DIAGNOSIS — Z713 Dietary counseling and surveillance: Secondary | ICD-10-CM | POA: Insufficient documentation

## 2013-07-26 DIAGNOSIS — E669 Obesity, unspecified: Secondary | ICD-10-CM

## 2013-07-26 NOTE — Patient Instructions (Signed)
Goals:  Follow Specialized Post Op Diet  Eat 3-6 small meals/snacks, every 3-5 hrs  Increase lean protein foods to meet 60-80g goal  Increase fluid intake to 64oz +  Avoid drinking 15 minutes before, during and 30 minutes after eating  Aim for >30 min of physical activity daily  Eat protein foods first, then non starchy-vegetables, then eat carbs.

## 2013-07-26 NOTE — Progress Notes (Signed)
Lap Band Refresher  Medical Nutrition Therapy:  Appt start time: 300  End time:  330.  Primary concerns today:  Post-operative Bariatric Surgery Nutrition Management. Rebecca [name] returns today for a refresher. States she was doing well after surgery and didn't like the "gap" in seeing a dietitian and starting eating more and "things she shouldn't eat". Lost about 35 lbs after surgery.  Has not lost weight since getting a fill in November, though states that she made changes to her diet since then. Stopped having caffeine drinks at that time.   Surgery date: 10/20/11 Surgery type: LAGB Start weight at [facility]: 236.2 lbs  Weight today: 217.0 lbs  Weight change: 12.5 lbs Total weight lost: 31.7 lbs BMI: 37.4 kg/m^2  Weight goal: 150 lbs % Weight goal met: 37%  TANITA  BODY COMP RESULTS  12/15/11 01/14/12 07/26/13   %Fat 49.2% 49% 51.1   Fat Mass (lbs) 102.0 100.0 111.0   Fat Free Mass (lbs) 105.5 104.5 106.0   Total Body Water (lbs) 77.0 76.5 77.5   24-hr recall: B (AM): oatmeal with fruit S (AM): none L (PM): spaghetti, chicken wings, fried fish, ham sandwich or leftovers Snk (PM): chips or cookies D (PM): 2 or 3 vegetables and 6-8 oz meat with bread sometimes Snk (PM): not usually   Fluid intake: 32-64 oz water,  1 cups of coffee, and lemonade sometimes  Estimated total protein intake: 65 g  Medications: See medication list; No changes reported  Supplementation:  Reports taking regularly  Using straws: No  Drinking while eating: Not usually  Hair loss: No Carbonated beverages: No N/V/D/C: No Last Lap-Band fill:  Fill 04/20/13. .25 cc for a total of 5.5 cc. Unsure if she's in the green zone.  Recent physical activity:  Walk 30 min @ 3 days/week; dancing at church 1 x week for 90 minutes with breaks  Progress Towards Goal(s):  In progress.  Nutritional Diagnosis:  [place]-3.3 Overweight/obesity As related to recent LAGB surgery.  As evidenced by patient attempting to follow post-op  nutritional guidelines for continued weight loss.    Intervention:  Nutrition education. Discuss adding protein, cutting carbs and adding additional exercise to help jump start weight loss.   Monitoring/Evaluation:  Dietary intake, exercise, lap band fills, and body weight. Follow up in 2 months.

## 2013-08-10 ENCOUNTER — Encounter (INDEPENDENT_AMBULATORY_CARE_PROVIDER_SITE_OTHER): Payer: 59

## 2013-08-13 LAB — HM MAMMOGRAPHY

## 2013-08-23 ENCOUNTER — Other Ambulatory Visit (INDEPENDENT_AMBULATORY_CARE_PROVIDER_SITE_OTHER): Payer: 59

## 2013-08-23 DIAGNOSIS — I10 Essential (primary) hypertension: Secondary | ICD-10-CM

## 2013-08-23 DIAGNOSIS — E785 Hyperlipidemia, unspecified: Secondary | ICD-10-CM

## 2013-08-23 DIAGNOSIS — R7303 Prediabetes: Secondary | ICD-10-CM

## 2013-08-23 DIAGNOSIS — R7309 Other abnormal glucose: Secondary | ICD-10-CM

## 2013-08-23 LAB — HEMOGLOBIN A1C: Hgb A1c MFr Bld: 6 % (ref 4.6–6.5)

## 2013-08-23 LAB — LIPID PANEL
CHOL/HDL RATIO: 5
Cholesterol: 212 mg/dL — ABNORMAL HIGH (ref 0–200)
HDL: 39.6 mg/dL (ref >39.00)
LDL CALC: 143 mg/dL — AB (ref 0–99)
TRIGLYCERIDES: 145 mg/dL (ref 0.0–149.0)
VLDL: 29 mg/dL (ref 0.0–40.0)

## 2013-08-23 LAB — BASIC METABOLIC PANEL
BUN: 11 mg/dL (ref 6–23)
CHLORIDE: 108 meq/L (ref 96–112)
CO2: 29 mEq/L (ref 19–32)
Calcium: 9.2 mg/dL (ref 8.4–10.5)
Creatinine, Ser: 0.7 mg/dL (ref 0.4–1.2)
GFR: 119.59 mL/min (ref >60.00)
Glucose, Bld: 87 mg/dL (ref 70–99)
Potassium: 3.7 mEq/L (ref 3.5–5.1)
Sodium: 141 mEq/L (ref 135–145)

## 2013-08-24 ENCOUNTER — Encounter (INDEPENDENT_AMBULATORY_CARE_PROVIDER_SITE_OTHER): Payer: 59

## 2013-08-24 ENCOUNTER — Encounter: Payer: Self-pay | Admitting: Family Medicine

## 2013-08-24 ENCOUNTER — Ambulatory Visit (INDEPENDENT_AMBULATORY_CARE_PROVIDER_SITE_OTHER): Payer: 59 | Admitting: Family Medicine

## 2013-08-24 VITALS — BP 134/80 | Temp 98.0°F | Wt 216.0 lb

## 2013-08-24 DIAGNOSIS — R7303 Prediabetes: Secondary | ICD-10-CM

## 2013-08-24 DIAGNOSIS — R7309 Other abnormal glucose: Secondary | ICD-10-CM

## 2013-08-24 DIAGNOSIS — E785 Hyperlipidemia, unspecified: Secondary | ICD-10-CM

## 2013-08-24 DIAGNOSIS — E669 Obesity, unspecified: Secondary | ICD-10-CM

## 2013-08-24 DIAGNOSIS — I10 Essential (primary) hypertension: Secondary | ICD-10-CM

## 2013-08-24 NOTE — Progress Notes (Signed)
Chief Complaint  Patient presents with  . Follow-up    HPI:  Follow up:  HLD/HTN/Obesity/Prediabetes: -reviewed recent labs -referred to nutrition in the past -hx lap band followed by Dr. Hassell Done -diet and exercise: walking daily for 2 miles; needs to work on diet -meds: lisinopril  ROS: See pertinent positives and negatives per HPI.  Past Medical History  Diagnosis Date  . Asthma   . Hyperlipidemia   . Hearing loss   . Hypertension   . Sleep apnea     uses c-pap  . Blindness, legal     L EYE  . Obesity, Class III, BMI 40-49.9 (morbid obesity) 06/03/2011  . Lapband May 2013 11/18/2011  . Migraines     Past Surgical History  Procedure Laterality Date  . Eye surgery      Patient unsure of surgery date. Left eye  . Knee surgery  1992    right  . Breath tek h pylori  08/17/2011    Procedure: BREATH TEK H PYLORI;  Surgeon: Pedro Earls, MD;  Location: [facility];  Service: General;  Laterality: N/A;  PATIENT WILL COME AT 0715  . Laparoscopic gastric banding  10/20/2011    Procedure: LAPAROSCOPIC GASTRIC BANDING;  Surgeon: Pedro Earls, MD;  Location: [facility];  Service: General;  Laterality: N/A;  . Abdominal hysterectomy  2006    fibroids    Family History  Problem Relation Age of Onset  . Cancer Mother     breast  . Breast cancer Mother   . Alcohol abuse Father   . Heart disease Father 24    MI  . Hypertension Father     History   Social History  . Marital Status: Married    Spouse Name: N/A    Number of Children: N/A  . Years of Education: N/A   Social History Main Topics  . Smoking status: Former Smoker -- 0.50 packs/day for 15 years    Types: Cigarettes    Quit date: 06/02/1994  . Smokeless tobacco: Never Used  . Alcohol Use: No  . Drug Use: No  . Sexual Activity: None   Other Topics Concern  . 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 For shortness of breath, Disp: , Rfl: ;  Calcium Citrate-Vitamin D (CALCIUM CITRATE + PO), Take 500 mg by mouth 3 (three) times daily., Disp: , Rfl: ;  lisinopril (PRINIVIL,ZESTRIL) 20 MG tablet, Take 1 tablet (20 mg total) by mouth daily., Disp: 90 tablet, Rfl: 3 Mometasone Furo-Formoterol Fum (DULERA IN), Inhale 2 puffs into the lungs 2 (two) times daily. , Disp: , Rfl: ;  Multiple Vitamins-Minerals (MULTIVITAMIN WITH MINERALS) tablet, Take 1 tablet by mouth daily with breakfast. , Disp: , Rfl: ;  Olopatadine HCl (PATANASE) 0.6 % SOLN, Place 2 puffs into the nose daily with breakfast. , Disp: , Rfl: ;  SINGULAIR 10 MG tablet, Take 20 mg by mouth at bedtime. ,  Disp: , Rfl:   EXAM:  Filed Vitals:   08/24/13 1537  BP: 134/80  Temp: 98 F (36.7 C)    Body mass index is 39.5 kg/(m^2).  GENERAL: vitals reviewed and listed above, alert, oriented, appears well hydrated and in no acute distress  HEENT: atraumatic, conjunttiva clear, no obvious abnormalities on inspection of external nose and ears  NECK: no obvious masses on inspection  LUNGS: clear to auscultation bilaterally, no wheezes, rales or rhonchi, good air movement  CV: HRRR, no peripheral edema  MS: moves all extremities without noticeable abnormality  PSYCH: pleasant and cooperative, no obvious depression or anxiety  ASSESSMENT AND PLAN:  Discussed the following assessment and plan:  Essential hypertension, benign  Hyperlipemia  Prediabetes  Obesity  -labs reviewed -advised lifestyle recs at length -discussed options for HLD - ? Statin and opted for diet and  exercise instead -follow up 4-6 months -Patient advised to return or notify a doctor immediately if symptoms worsen or persist or new concerns arise.  Patient Instructions  We recommend the following healthy lifestyle measures: - eat a healthy diet consisting of lots of vegetables, fruits, beans, nuts, seeds, healthy meats such as white chicken and fish and whole grains.  - avoid fried foods, fast food, processed foods, sodas, red meet and other fattening foods.  - get a least 150 minutes of aerobic exercise per week.   You may find this book helpful for your diabetes. It is available on [place] and is fairly inexpensive.  Dr. Janene Harvey Program for Reversing Diabetes: The Scientifically Proven System for Reversing Diabetes without Drugs  http://www.nealbarnard.org/index.cfm  Follow up in 4 month       KIM, HANNAH R.

## 2013-08-24 NOTE — Patient Instructions (Addendum)
We recommend the following healthy lifestyle measures: - eat a healthy diet consisting of lots of vegetables, fruits, beans, nuts, seeds, healthy meats such as white chicken and fish and whole grains.  - avoid fried foods, fast food, processed foods, sodas, red meet and other fattening foods.  - get a least 150 minutes of aerobic exercise per week.   You may find this book helpful for your diabetes. It is available on [place] and is fairly inexpensive.  Dr. Janene Harvey Program for Reversing Diabetes: The Scientifically Proven System for Reversing Diabetes without Drugs  http://www.nealbarnard.org/index.cfm  Follow up in 4-6 month

## 2013-08-24 NOTE — Progress Notes (Signed)
Pre visit review using our clinic review tool, if applicable. No additional management support is needed unless otherwise documented below in the visit note. 

## 2013-08-25 ENCOUNTER — Telehealth: Payer: Self-pay | Admitting: Family Medicine

## 2013-08-25 NOTE — Telephone Encounter (Signed)
Relevant patient education assigned to patient using Emmi. ° °

## 2013-09-07 ENCOUNTER — Encounter (INDEPENDENT_AMBULATORY_CARE_PROVIDER_SITE_OTHER): Payer: 59

## 2013-09-25 ENCOUNTER — Ambulatory Visit: Payer: 59 | Admitting: Dietician

## 2013-09-28 ENCOUNTER — Encounter (INDEPENDENT_AMBULATORY_CARE_PROVIDER_SITE_OTHER): Payer: 59

## 2013-10-18 ENCOUNTER — Ambulatory Visit: Payer: 59 | Admitting: Pulmonary Disease

## 2013-11-01 ENCOUNTER — Ambulatory Visit: Payer: 59 | Admitting: Dietician

## 2014-01-16 ENCOUNTER — Ambulatory Visit (INDEPENDENT_AMBULATORY_CARE_PROVIDER_SITE_OTHER): Payer: 59 | Admitting: Family Medicine

## 2014-01-16 ENCOUNTER — Encounter: Payer: Self-pay | Admitting: Family Medicine

## 2014-01-16 VITALS — BP 138/88 | HR 75 | Temp 98.5°F | Ht 62.0 in | Wt 212.0 lb

## 2014-01-16 DIAGNOSIS — I10 Essential (primary) hypertension: Secondary | ICD-10-CM

## 2014-01-16 DIAGNOSIS — J45909 Unspecified asthma, uncomplicated: Secondary | ICD-10-CM

## 2014-01-16 DIAGNOSIS — E669 Obesity, unspecified: Secondary | ICD-10-CM

## 2014-01-16 DIAGNOSIS — R7309 Other abnormal glucose: Secondary | ICD-10-CM

## 2014-01-16 DIAGNOSIS — E785 Hyperlipidemia, unspecified: Secondary | ICD-10-CM

## 2014-01-16 DIAGNOSIS — R7303 Prediabetes: Secondary | ICD-10-CM

## 2014-01-16 DIAGNOSIS — J309 Allergic rhinitis, unspecified: Secondary | ICD-10-CM

## 2014-01-16 MED ORDER — FLUTICASONE PROPIONATE 50 MCG/ACT NA SUSP: 2.0000 | Freq: Every day | NASAL | Status: DC

## 2014-01-16 NOTE — Progress Notes (Signed)
Pre visit review using our clinic review tool, if applicable. No additional management support is needed unless otherwise documented below in the visit note. 

## 2014-01-16 NOTE — Progress Notes (Signed)
No chief complaint on file.   HPI:  1) URI: -started: 3 days ago, but had similar symptoms a few weeks ago treated with augmentin and resolved completely -symptoms:nasal congestion, sore throat, cough, sneezing, itchy watery eyes -denies:fever, SOB, NVD, tooth pain, wheezing -has tried: singulair -sick contacts/travel/risks: denies flu exposure, tick exposure or or Ebola risks -Hx of: allergies and asthma, sees allergist for this and has appointment coming up  2-4) HTN, Obesity, prediabetes, HLD: -working on diet and exercise -stable -denies: CP, polyuria, polydipsia ROS: See pertinent positives and negatives per HPI.  Past Medical History  Diagnosis Date  . Asthma   . Hyperlipidemia   . Hearing loss   . Hypertension   . Sleep apnea     uses c-pap  . Blindness, legal     L EYE  . Obesity, Class III, BMI 40-49.9 (morbid obesity) 06/03/2011  . Lapband May 2013 11/18/2011  . Migraines     Past Surgical History  Procedure Laterality Date  . Eye surgery      Patient unsure of surgery date. Left eye  . Knee surgery  1992    right  . Breath tek h pylori  08/17/2011    Procedure: BREATH TEK H PYLORI;  Surgeon: Pedro Earls, MD;  Location: [facility];  Service: General;  Laterality: N/A;  PATIENT WILL COME AT 0715  . Laparoscopic gastric banding  10/20/2011    Procedure: LAPAROSCOPIC GASTRIC BANDING;  Surgeon: Pedro Earls, MD;  Location: [facility];  Service: General;  Laterality: N/A;  . Abdominal hysterectomy  2006    fibroids    Family History  Problem Relation Age of Onset  . Cancer Mother     breast  . Breast cancer Mother   . Alcohol abuse Father   . Heart disease Father 76    MI  . Hypertension Father     History   Social History  . Marital Status: Married    Spouse Name: N/A    Number of Children: N/A  . Years of Education: N/A   Social History Main Topics  . Smoking status: Former Smoker -- 0.50 packs/day for 15 years    Types: Cigarettes    Quit  date: 06/02/1994  . Smokeless tobacco: Never Used  . Alcohol Use: No  . Drug Use: No  . Sexual Activity: None   Other Topics Concern  . 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 For shortness of breath, Disp: , Rfl: ;  Calcium Citrate-Vitamin D (CALCIUM CITRATE + PO), Take 500 mg by mouth 3 (three) times daily., Disp: , Rfl: ;  Cholecalciferol (VITAMIN D-3 PO), Take by mouth daily., Disp: , Rfl: ;  Fish Oil OIL, by Does not apply route daily., Disp: , Rfl:  lisinopril (PRINIVIL,ZESTRIL) 20 MG tablet, Take 1 tablet (20 mg total) by mouth daily., Disp: 90 tablet, Rfl: 3;  Mometasone Furo-Formoterol Fum (DULERA IN), Inhale 2 puffs into  the lungs 2 (two) times daily. , Disp: , Rfl: ;  Multiple Vitamins-Minerals (MULTIVITAMIN WITH MINERALS) tablet, Take 1 tablet by mouth daily with breakfast. , Disp: , Rfl:  Olopatadine HCl (PATANASE) 0.6 % SOLN, Place 2 puffs into the nose daily with breakfast. , Disp: , Rfl: ;  SINGULAIR 10 MG tablet, Take 20 mg by mouth at bedtime. , Disp: , Rfl: ;  fluticasone (FLONASE) 50 MCG/ACT nasal spray, Place 2 sprays into both nostrils daily., Disp: 16 g, Rfl: 1  EXAM:  Filed Vitals:   01/16/14 0920  BP: 138/88  Pulse: 75  Temp: 98.5 F (36.9 C)    Body mass index is 38.77 kg/(m^2).  GENERAL: vitals reviewed and listed above, alert, oriented, appears well hydrated and in no acute distress  HEENT: atraumatic, conjunttiva clear, no obvious abnormalities on inspection of external nose and ears, normal appearance  of ear canals and TMs, clear nasal congestion, mild post oropharyngeal erythema with PND, no tonsillar edema or exudate, no sinus TTP  NECK: no obvious masses on inspection  LUNGS: clear to auscultation bilaterally, no wheezes, rales or rhonchi, good air movement  CV: HRRR, no peripheral edema  MS: moves all extremities without noticeable abnormality  PSYCH: pleasant and cooperative, no obvious depression or anxiety  ASSESSMENT AND PLAN:  Discussed the following assessment and plan:  Allergic rhinitis, unspecified allergic rhinitis type - Plan: fluticasone (FLONASE) 50 MCG/ACT nasal spray  Unspecified asthma(493.90)  Essential hypertension, benign - Plan: Basic metabolic panel  Other and unspecified hyperlipidemia - Plan: Lipid Panel  Prediabetes - Plan: Hemoglobin A1c  Obesity, unspecified  -of course, we advised to return or notify a doctor immediately if symptoms worsen or persist or new concerns arise.   Patient Instructions  -start claritin OR zyrtec daily until you see the allergist  -AFRIN nasal spray for 4 days and then STOP  -start flonase or nasocort daily until you see the allergist per instructions  -We recommend the following healthy lifestyle measures: - eat a healthy diet consisting of lots of vegetables, fruits, beans, nuts, seeds, healthy meats such as white chicken and fish and whole grains.  - avoid fried foods, fast food, processed foods, sodas, red meet and other fattening foods.  - get a least 150 minutes of aerobic exercise per week.   -schedule FASTING lab appointment as you leave today  -follow up in 4 months or as needed     Kazuo Durnil R.

## 2014-01-16 NOTE — Patient Instructions (Signed)
-  start claritin OR zyrtec daily until you see the allergist  -AFRIN nasal spray for 4 days and then STOP  -start flonase or nasocort daily until you see the allergist per instructions  -We recommend the following healthy lifestyle measures: - eat a healthy diet consisting of lots of vegetables, fruits, beans, nuts, seeds, healthy meats such as white chicken and fish and whole grains.  - avoid fried foods, fast food, processed foods, sodas, red meet and other fattening foods.  - get a least 150 minutes of aerobic exercise per week.   -schedule FASTING lab appointment as you leave today  -follow up in 4 months or as needed

## 2014-01-17 ENCOUNTER — Other Ambulatory Visit (INDEPENDENT_AMBULATORY_CARE_PROVIDER_SITE_OTHER): Payer: 59

## 2014-01-17 DIAGNOSIS — R7303 Prediabetes: Secondary | ICD-10-CM

## 2014-01-17 DIAGNOSIS — E785 Hyperlipidemia, unspecified: Secondary | ICD-10-CM

## 2014-01-17 DIAGNOSIS — R7309 Other abnormal glucose: Secondary | ICD-10-CM

## 2014-01-17 DIAGNOSIS — I10 Essential (primary) hypertension: Secondary | ICD-10-CM

## 2014-01-17 LAB — BASIC METABOLIC PANEL
BUN: 19 mg/dL (ref 6–23)
CHLORIDE: 105 meq/L (ref 96–112)
CO2: 24 meq/L (ref 19–32)
Calcium: 9.3 mg/dL (ref 8.4–10.5)
Creatinine, Ser: 0.8 mg/dL (ref 0.4–1.2)
GFR: 99.72 mL/min (ref >60.00)
GLUCOSE: 90 mg/dL (ref 70–99)
POTASSIUM: 3.5 meq/L (ref 3.5–5.1)
SODIUM: 140 meq/L (ref 135–145)

## 2014-01-17 LAB — LIPID PANEL
Cholesterol: 197 mg/dL (ref 0–200)
HDL: 42.9 mg/dL (ref >39.00)
LDL CALC: 130 mg/dL — AB (ref 0–99)
NONHDL: 154.1
Total CHOL/HDL Ratio: 5
Triglycerides: 121 mg/dL (ref 0.0–149.0)
VLDL: 24.2 mg/dL (ref 0.0–40.0)

## 2014-01-17 LAB — HEMOGLOBIN A1C: Hgb A1c MFr Bld: 6.2 % (ref 4.6–6.5)

## 2014-01-23 ENCOUNTER — Ambulatory Visit: Payer: 59 | Admitting: Physician Assistant

## 2014-04-12 ENCOUNTER — Encounter: Payer: Self-pay | Admitting: Family Medicine

## 2014-04-12 ENCOUNTER — Ambulatory Visit (INDEPENDENT_AMBULATORY_CARE_PROVIDER_SITE_OTHER): Payer: 59 | Admitting: Family Medicine

## 2014-04-12 VITALS — BP 134/84 | HR 88 | Temp 98.3°F | Ht 62.0 in | Wt 212.0 lb

## 2014-04-12 DIAGNOSIS — M542 Cervicalgia: Secondary | ICD-10-CM

## 2014-04-12 DIAGNOSIS — M5489 Other dorsalgia: Secondary | ICD-10-CM

## 2014-04-12 NOTE — Progress Notes (Signed)
No chief complaint on file.   HPI:  Acute visit for:  1)pain in bilat shoulders: -burning mod pain, bilat shoulders and lower neck that started about 1 month ago, R>L -intermittent, occurs almost daily -seems like when spends more time typing at counter on ipad it is worse -better with ibuprofen or tylenol -denies: weakness, HA, numbness, radiation to arms, malaise, fevers, trauma  ROS: See pertinent positives and negatives per HPI.  Past Medical History  Diagnosis Date  . Asthma   . Hyperlipidemia   . Hearing loss   . Hypertension   . Sleep apnea     uses c-pap  . Blindness, legal     L EYE  . Obesity, Class III, BMI 40-49.9 (morbid obesity) 06/03/2011  . Lapband May 2013 11/18/2011  . Migraines     Past Surgical History  Procedure Laterality Date  . Eye surgery      Patient unsure of surgery date. Left eye  . Knee surgery  1992    right  . Breath tek h pylori  08/17/2011    Procedure: BREATH TEK H PYLORI;  Surgeon: Pedro Earls, MD;  Location: [facility];  Service: General;  Laterality: N/A;  PATIENT WILL COME AT 0715  . Laparoscopic gastric banding  10/20/2011    Procedure: LAPAROSCOPIC GASTRIC BANDING;  Surgeon: Pedro Earls, MD;  Location: [facility];  Service: General;  Laterality: N/A;  . Abdominal hysterectomy  2006    fibroids    Family History  Problem Relation Age of Onset  . Cancer Mother     breast  . Breast cancer Mother   . Alcohol abuse Father   . Heart disease Father 8    MI  . Hypertension Father     History   Social History  . Marital Status: Married    Spouse Name: N/A    Number of Children: N/A  . Years of Education: N/A   Social History Main Topics  . Smoking status: Former Smoker -- 0.50 packs/day for 15 years    Types: Cigarettes    Quit date: 06/02/1994  . Smokeless tobacco: Never Used  . Alcohol Use: No  . Drug Use: No  . Sexual Activity: None   Other Topics Concern  . 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 For shortness of breath, Disp: , Rfl: ;  Calcium Citrate-Vitamin D (CALCIUM CITRATE + PO), Take 500 mg by mouth 3 (three) times daily., Disp: , Rfl: ;  Cholecalciferol (VITAMIN D-3 PO), Take by mouth daily., Disp: , Rfl: ;  Fish Oil OIL, by Does not apply route daily., Disp: , Rfl:  fluticasone (FLONASE) 50 MCG/ACT nasal spray, Place 2 sprays into both nostrils daily., Disp: 16 g, Rfl: 1;  lisinopril (PRINIVIL,ZESTRIL) 20 MG tablet, Take 1 tablet (20 mg total) by mouth daily., Disp: 90 tablet, Rfl: 3;  Mometasone Furo-Formoterol Fum (DULERA IN), Inhale 2 puffs into the lungs 2 (  two) times daily. , Disp: , Rfl:  Multiple Vitamins-Minerals (MULTIVITAMIN WITH MINERALS) tablet, Take 1 tablet by mouth daily with breakfast. , Disp: , Rfl: ;  Olopatadine HCl (PATANASE) 0.6 % SOLN, Place 2 puffs into the nose daily with breakfast. , Disp: , Rfl: ;  SINGULAIR 10 MG tablet, Take 20 mg by mouth at bedtime. , Disp: , Rfl:   EXAM:  Filed Vitals:   04/12/14 1457  BP: 134/84  Pulse: 88  Temp: 98.3 F (36.8 C)    Body mass index is 38.77 kg/(m^2).  GENERAL: vitals reviewed and listed above, alert, oriented, appears well hydrated and in no acute distress  HEENT: atraumatic, conjunttiva clear, no obvious abnormalities on inspection of external nose and ears  NECK: no obvious masses on inspection  MS: moves all extremities without noticeable abnormality Normal inspection of head/neck, shoulder/back except for head/shoulders forward posture, R shoulder  higher then L Muscle tension R trap, no bony TTP Normal ROM head and neg, neg spurling Normal strength and sensation in bilat upper extremities  PSYCH: pleasant and cooperative, no obvious depression or anxiety  ASSESSMENT AND PLAN:  Discussed the following assessment and plan:  Other back pain  Neck pain  -we discussed possible serious and likely etiologies, workup and treatment, treatment risks and return precautions - suspect postural and muscular related given exam -after this discussion, Rebecca [name] for conservative tx with HEP, analgesics as needed, heat, follow up 4 weeks -of course, we advised Azlee  to return or notify a doctor immediately if symptoms worsen or persist or new concerns arise.   -Patient advised to return or notify a doctor immediately if symptoms worsen or persist or new concerns arise.  Patient Instructions  BEFORE YOU LEAVE: -upper back and neck strain exercises -change follow up to CPE and follow up in about 4-6 weeks  Do the exercises at least 4 days per week Heat for 15 minutes twice daily For pain can use topical sports creams, tyelnol or aleve - not more then advised on bottle      KIM, HANNAH R.

## 2014-04-12 NOTE — Progress Notes (Signed)
Pre visit review using our clinic review tool, if applicable. No additional management support is needed unless otherwise documented below in the visit note. 

## 2014-04-12 NOTE — Patient Instructions (Signed)
BEFORE YOU LEAVE: -upper back and neck strain exercises -change follow up to CPE and follow up in about 4-6 weeks  Do the exercises at least 4 days per week Heat for 15 minutes twice daily For pain can use topical sports creams, tyelnol or aleve - not more then advised on bottle

## 2014-05-03 ENCOUNTER — Ambulatory Visit (INDEPENDENT_AMBULATORY_CARE_PROVIDER_SITE_OTHER): Payer: 59 | Admitting: Family Medicine

## 2014-05-03 ENCOUNTER — Ambulatory Visit (INDEPENDENT_AMBULATORY_CARE_PROVIDER_SITE_OTHER)
Admission: RE | Admit: 2014-05-03 | Discharge: 2014-05-03 | Disposition: A | Discharge status: Home / Self Care | Payer: 59 | Source: Ambulatory Visit | Attending: Family Medicine | Admitting: Family Medicine

## 2014-05-03 ENCOUNTER — Encounter: Payer: Self-pay | Admitting: Family Medicine

## 2014-05-03 VITALS — BP 128/88 | HR 79 | Temp 98.3°F | Ht 62.0 in | Wt 211.5 lb

## 2014-05-03 DIAGNOSIS — M542 Cervicalgia: Secondary | ICD-10-CM

## 2014-05-03 MED ORDER — TIZANIDINE HCL 4 MG PO TABS: 2.0000 mg | ORAL_TABLET | Freq: Two times a day (BID) | ORAL | Status: DC | PRN

## 2014-05-03 NOTE — Progress Notes (Signed)
Pre visit review using our clinic review tool, if applicable. No additional management support is needed unless otherwise documented below in the visit note. 

## 2014-05-03 NOTE — Progress Notes (Signed)
HPI:  Acute visit for:  Back Pain: -burning mod pain, bilat shoulders and lower neck that started about 6 weeks ago - advised HEP -reports was getting better, then yesterday had pain in L post paraspinal muscles of neck that was mod-severe at times -she did realize she is sleeping in a different bed with a different pillow just prior to this starting -seems like when spends more time typing at counter on ipad it is worse -better with ibuprofen or tylenol -denies: weakness, HA, numbness, radiation to arms, malaise, fevers, trauma  ROS: See pertinent positives and negatives per HPI.  Past Medical History  Diagnosis Date  . Asthma   . Hyperlipidemia   . Hearing loss   . Hypertension   . Sleep apnea     uses c-pap  . Blindness, legal     L EYE  . Obesity, Class III, BMI 40-49.9 (morbid obesity) 06/03/2011  . Lapband May 2013 11/18/2011  . Migraines     Past Surgical History  Procedure Laterality Date  . Eye surgery      Patient unsure of surgery date. Left eye  . Knee surgery  1992    right  . Breath tek h pylori  08/17/2011    Procedure: BREATH TEK H PYLORI;  Surgeon: Pedro Earls, MD;  Location: [facility];  Service: General;  Laterality: N/A;  PATIENT WILL COME AT 0715  . Laparoscopic gastric banding  10/20/2011    Procedure: LAPAROSCOPIC GASTRIC BANDING;  Surgeon: Pedro Earls, MD;  Location: [facility];  Service: General;  Laterality: N/A;  . Abdominal hysterectomy  2006    fibroids    Family History  Problem Relation Age of Onset  . Cancer Mother     breast  . Breast cancer Mother   . Alcohol abuse Father   . Heart disease Father 37    MI  . Hypertension Father     History   Social History  . Marital Status: Married    Spouse Name: N/A    Number of Children: N/A  . Years of Education: N/A   Social History Main Topics  . Smoking status: Former Smoker -- 0.50 packs/day for 15 years    Types: Cigarettes    Quit date: 06/02/1994  . Smokeless tobacco:  Never Used  . Alcohol Use: No  . Drug Use: No  . Sexual Activity: None   Other Topics Concern  . 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 For shortness of breath, Disp: , Rfl: ;  Calcium Citrate-Vitamin D (CALCIUM CITRATE + PO), Take 500 mg by mouth 3 (three) times daily., Disp: , Rfl: ;  Cholecalciferol (VITAMIN D-3 PO), Take by mouth daily., Disp: , Rfl: ;  Fish Oil OIL, by Does not apply route daily., Disp: , Rfl:  fluticasone (FLONASE) 50 MCG/ACT nasal spray, Place 2 sprays into both nostrils daily., Disp: 16 g, Rfl: 1;  lisinopril (PRINIVIL,ZESTRIL) 20  MG tablet, Take 1 tablet (20 mg total) by mouth daily., Disp: 90 tablet, Rfl: 3;  Mometasone Furo-Formoterol Fum (DULERA IN), Inhale 2 puffs into the lungs 2 (two) times daily. , Disp: , Rfl:  Multiple Vitamins-Minerals (MULTIVITAMIN WITH MINERALS) tablet, Take 1 tablet by mouth daily with breakfast. , Disp: , Rfl: ;  Olopatadine HCl (PATANASE) 0.6 % SOLN, Place 2 puffs into the nose daily with breakfast. , Disp: , Rfl: ;  SINGULAIR 10 MG tablet, Take 20 mg by mouth at bedtime. , Disp: , Rfl:  tiZANidine (ZANAFLEX) 4 MG tablet, Take 0.5 tablets (2 mg total) by mouth every 12 (twelve) hours as needed for muscle spasms., Disp: 10 tablet, Rfl: 0  EXAM:  Filed Vitals:   05/03/14 1016  BP: 128/88  Pulse: 79  Temp: 98.3 F (36.8 C)    Body mass index is 38.67 kg/(m^2).  GENERAL: vitals reviewed and listed above, alert, oriented, appears well hydrated and in no acute distress  HEENT:  atraumatic, conjunttiva clear, no obvious abnormalities on inspection of external nose and ears  NECK: no obvious masses on inspection  LUNGS: clear to auscultation bilaterally, no wheezes, rales or rhonchi, good air movement  CV: HRRR, no peripheral edema  MS: moves all extremities without noticeable abnormality -normal appearance except for head forward position -normal ROM head and neck  -TTP in traps bilat, L >R, no bony TTP, neg spurling -normal strength and sensation in UEs bilat  PSYCH: pleasant and cooperative, no obvious depression or anxiety  ASSESSMENT AND PLAN:  Discussed the following assessment and plan:  Neck pain - Plan: DG Cervical Spine Complete, tiZANidine (ZANAFLEX) 4 MG tablet  -seems most likely muscular, was improving then pain in a different area yesterday, possible ddd with radicular pain, other etiologies of neck pain discussed -xrays, change sleeping arrangement, pillow, proper position of neck while sleeping -trial of muscle relaxer, close follow up consider MRI or referral if persistent -Patient advised to return or notify a doctor immediately if symptoms worsen or persist or new concerns arise.  Patient Instructions  BEFORE YOU LEAVE: -cancel physical -schedule follow up in 2-3 weeks for neck and labs, morning appt, come fasting  -continue home exercise  -get xrays of neck  -muscle relaxer before bed  -change pillow and pay attention to sleeping position     Rebecca [name], Rebecca [name]

## 2014-05-03 NOTE — Patient Instructions (Signed)
BEFORE YOU LEAVE: -cancel physical -schedule follow up in 2-3 weeks for neck and labs, morning appt, come fasting  -continue home exercise  -get xrays of neck  -muscle relaxer before bed  -change pillow and pay attention to sleeping position

## 2014-05-14 ENCOUNTER — Encounter: Payer: 59 | Admitting: Family Medicine

## 2014-05-17 ENCOUNTER — Encounter: Payer: Self-pay | Admitting: Family Medicine

## 2014-05-17 ENCOUNTER — Other Ambulatory Visit: Payer: 59

## 2014-05-17 ENCOUNTER — Ambulatory Visit (INDEPENDENT_AMBULATORY_CARE_PROVIDER_SITE_OTHER): Payer: 59 | Admitting: Family Medicine

## 2014-05-17 VITALS — BP 124/82 | HR 77 | Temp 97.8°F | Ht 62.0 in | Wt 212.3 lb

## 2014-05-17 DIAGNOSIS — R739 Hyperglycemia, unspecified: Secondary | ICD-10-CM

## 2014-05-17 DIAGNOSIS — M792 Neuralgia and neuritis, unspecified: Secondary | ICD-10-CM

## 2014-05-17 DIAGNOSIS — M5030 Other cervical disc degeneration, unspecified cervical region: Secondary | ICD-10-CM

## 2014-05-17 DIAGNOSIS — I10 Essential (primary) hypertension: Secondary | ICD-10-CM

## 2014-05-17 DIAGNOSIS — E669 Obesity, unspecified: Secondary | ICD-10-CM

## 2014-05-17 DIAGNOSIS — E785 Hyperlipidemia, unspecified: Secondary | ICD-10-CM

## 2014-05-17 DIAGNOSIS — M5410 Radiculopathy, site unspecified: Secondary | ICD-10-CM

## 2014-05-17 DIAGNOSIS — M542 Cervicalgia: Secondary | ICD-10-CM

## 2014-05-17 LAB — BASIC METABOLIC PANEL
BUN: 13 mg/dL (ref 6–23)
CO2: 27 mEq/L (ref 19–32)
Calcium: 9.4 mg/dL (ref 8.4–10.5)
Chloride: 107 mEq/L (ref 96–112)
Creatinine, Ser: 0.7 mg/dL (ref 0.4–1.2)
GFR: 109.52 mL/min (ref >60.00)
Glucose, Bld: 83 mg/dL (ref 70–99)
POTASSIUM: 3.5 meq/L (ref 3.5–5.1)
Sodium: 141 mEq/L (ref 135–145)

## 2014-05-17 LAB — LIPID PANEL
Cholesterol: 227 mg/dL — ABNORMAL HIGH (ref 0–200)
HDL: 36.2 mg/dL — AB (ref >39.00)
LDL Cholesterol: 155 mg/dL — ABNORMAL HIGH (ref 0–99)
NONHDL: 190.8
Total CHOL/HDL Ratio: 6
Triglycerides: 180 mg/dL — ABNORMAL HIGH (ref 0.0–149.0)
VLDL: 36 mg/dL (ref 0.0–40.0)

## 2014-05-17 LAB — HEMOGLOBIN A1C: HEMOGLOBIN A1C: 6.2 % (ref 4.6–6.5)

## 2014-05-17 MED ORDER — PREDNISONE 20 MG PO TABS: 40.0000 mg | ORAL_TABLET | Freq: Every day | ORAL | Status: DC

## 2014-05-17 NOTE — Progress Notes (Signed)
Pre visit review using our clinic review tool, if applicable. No additional management support is needed unless otherwise documented below in the visit note. 

## 2014-05-17 NOTE — Patient Instructions (Signed)
Before you leave: -labs -3 month follow up  -take the prednisone for you neck, call in 2 weeks and if persist will place referral as we discussed  -check on cost of shingles vaccines and let us know if you want to do this  -schedule gyn exam with Dr. Matthew Saras with mammogram and check on last colonoscopy and let us know if you need [place] to place referral for this  -follow up in 3 months or sooner if needed

## 2014-05-17 NOTE — Progress Notes (Signed)
HPI:  Follow up:  Neck Pain: -L then resolved with HEP, then R for last several weeks -thought to be from new pillow and new bed -plain films with mod DDD -reports: she is doing somewhat better with changing her pillow and bed and HEP and muscle relaxer, but ishaving intermittent burning pain in L shoulder  -denies: radiation to arms, fevers, malaise, numbness, weakness in arms  Obesity/HTN, HLD, Hyperglycemia: -diet and exercise: walking 3 times per week for 45 minutes; working on limiting carbs and sugar - she is trying to cut out sodas and juice again  -meds: lisinopril -denies: CP, SOB, DOE, swelling, palpitations, polyuria, polydipsea  HM: -mammo: reports she needs to reschedule appt - does this with Dr Matthew Saras -reports her gynecologist had her do one - but she thinks has been about 7 years she thinks -shingles  ROS: See pertinent positives and negatives per HPI.  Past Medical History  Diagnosis Date  . Asthma   . Hyperlipidemia   . Hearing loss   . Hypertension   . Sleep apnea     uses c-pap  . Blindness, legal     L EYE  . Obesity, Class III, BMI 40-49.9 (morbid obesity) 06/03/2011  . Lapband May 2013 11/18/2011  . Migraines     Past Surgical History  Procedure Laterality Date  . Eye surgery      Patient unsure of surgery date. Left eye  . Knee surgery  1992    right  . Breath tek h pylori  08/17/2011    Procedure: BREATH TEK H PYLORI;  Surgeon: Pedro Earls, MD;  Location: [facility];  Service: General;  Laterality: N/A;  PATIENT WILL COME AT 0715  . Laparoscopic gastric banding  10/20/2011    Procedure: LAPAROSCOPIC GASTRIC BANDING;  Surgeon: Pedro Earls, MD;  Location: [facility];  Service: General;  Laterality: N/A;  . Abdominal hysterectomy  2006    fibroids    Family History  Problem Relation Age of Onset  . Cancer Mother     breast  . Breast cancer Mother   . Alcohol abuse Father   . Heart disease Father 68    MI  . Hypertension Father      History   Social History  . Marital Status: Married    Spouse Name: N/A    Number of Children: N/A  . Years of Education: N/A   Social History Main Topics  . Smoking status: Former Smoker -- 0.50 packs/day for 15 years    Types: Cigarettes    Quit date: 06/02/1994  . Smokeless tobacco: Never Used  . Alcohol Use: No  . Drug Use: No  . Sexual Activity: None   Other Topics Concern  . 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 For shortness of breath, Disp: , Rfl: ;  Calcium Citrate-Vitamin D (CALCIUM CITRATE + PO), Take 500 mg by mouth 3 (three) times daily., Disp: ,  Rfl: ;  Cholecalciferol (VITAMIN D-3 PO), Take by mouth daily., Disp: , Rfl: ;  Fish Oil OIL, by Does not apply route daily., Disp: , Rfl:  fluticasone (FLONASE) 50 MCG/ACT nasal spray, Place 2 sprays into both nostrils daily., Disp: 16 g, Rfl: 1;  lisinopril (PRINIVIL,ZESTRIL) 20 MG tablet, Take 1 tablet (20 mg total) by mouth daily., Disp: 90 tablet, Rfl: 3;  Mometasone Furo-Formoterol Fum (DULERA IN), Inhale 2 puffs into the lungs 2 (two) times daily. , Disp: , Rfl:  Multiple Vitamins-Minerals (MULTIVITAMIN WITH MINERALS) tablet, Take 1 tablet by mouth daily with breakfast. , Disp: , Rfl: ;  Olopatadine HCl (PATANASE) 0.6 % SOLN, Place 2 puffs into the nose daily with breakfast. , Disp: , Rfl: ;  SINGULAIR 10 MG tablet, Take 20 mg by mouth at bedtime. , Disp: , Rfl:  tiZANidine (ZANAFLEX) 4 MG tablet, Take 0.5 tablets (2 mg total) by mouth every 12 (twelve) hours as  needed for muscle spasms., Disp: 10 tablet, Rfl: 0;  predniSONE (DELTASONE) 20 MG tablet, Take 2 tablets (40 mg total) by mouth daily with breakfast., Disp: 8 tablet, Rfl: 0  EXAM:  Filed Vitals:   05/17/14 0907  BP: 124/82  Pulse: 77  Temp: 97.8 F (36.6 C)    Body mass index is 38.82 kg/(m^2).  GENERAL: vitals reviewed and listed above, alert, oriented, appears well hydrated and in no acute distress  HEENT: atraumatic, conjunttiva clear, no obvious abnormalities on inspection of external nose and ears  NECK: no obvious masses on inspection  LUNGS: clear to auscultation bilaterally, no wheezes, rales or rhonchi, good air movement  CV: HRRR, no peripheral edema  MS: moves all extremities without noticeable abnormality Normal inspection except for head forward position, TTP in R and L traps, no bony TTP, normal ROM head and neck, normal strength and sensation to light touch in UE bilat  PSYCH: pleasant and cooperative, no obvious depression or anxiety  ASSESSMENT AND PLAN:  Discussed the following assessment and plan:  Neck pain  DDD (degenerative disc disease), cervical  Radicular pain - Plan: predniSONE (DELTASONE) 20 MG tablet  Hyperglycemia - Plan: Hemoglobin A1c  Obesity  Hyperlipemia - Plan: Lipid Panel  Essential hypertension, benign - Plan: Basic metabolic panel  -discussed options for neck pain, suspect multifactorial, possible mild radiculopathy and opted for trial prednisone (discussed risks) for this and PMR referral if persists. -FASTING labs -lifestyle recs -she does HM exams with her gyn - advised of out of date HM  -Patient advised to return or notify a doctor immediately if symptoms worsen or persist or new concerns arise.  Patient Instructions  Before you leave: -labs -3 month follow up  -take the prednisone for you neck, call in 2 weeks and if persist will place referral as we discussed  -check on cost of shingles vaccines and let us know  if you want to do this  -schedule gyn exam with Dr. Matthew Saras with mammogram and check on last colonoscopy and let us know if you need [place] to place referral for this  -follow up in 3 months or sooner if needed     Colin Benton R.

## 2014-05-18 ENCOUNTER — Ambulatory Visit: Payer: 59 | Admitting: Family Medicine

## 2014-06-16 ENCOUNTER — Other Ambulatory Visit: Payer: Self-pay | Admitting: Family Medicine

## 2014-08-16 ENCOUNTER — Ambulatory Visit (INDEPENDENT_AMBULATORY_CARE_PROVIDER_SITE_OTHER): Payer: Self-pay | Admitting: Family Medicine

## 2014-08-16 DIAGNOSIS — R69 Illness, unspecified: Secondary | ICD-10-CM

## 2014-08-16 NOTE — Progress Notes (Signed)
NO SHOW      HM: -mammo -colon -shingles -HIV  DDD Neck: -did prednisone for ? Radicular pain last visit and advised 2 week follow up if persisted for referral  HLD/Obesity: -s/p lap band 2013 -meds: fish oil  HTN: -meds: lisinopril 20   Asthma and Allergy: -meds: singulair, patanase, dulera, flonase, alb

## 2014-08-17 ENCOUNTER — Ambulatory Visit: Payer: Self-pay | Admitting: Family Medicine

## 2014-08-24 ENCOUNTER — Encounter: Payer: Self-pay | Admitting: Family Medicine

## 2014-08-24 ENCOUNTER — Ambulatory Visit (INDEPENDENT_AMBULATORY_CARE_PROVIDER_SITE_OTHER): Payer: 59 | Admitting: Family Medicine

## 2014-08-24 VITALS — BP 138/82 | HR 87 | Temp 98.3°F | Ht 62.0 in | Wt 214.4 lb

## 2014-08-24 DIAGNOSIS — I10 Essential (primary) hypertension: Secondary | ICD-10-CM

## 2014-08-24 DIAGNOSIS — Z1211 Encounter for screening for malignant neoplasm of colon: Secondary | ICD-10-CM

## 2014-08-24 DIAGNOSIS — J4531 Mild persistent asthma with (acute) exacerbation: Secondary | ICD-10-CM

## 2014-08-24 DIAGNOSIS — E785 Hyperlipidemia, unspecified: Secondary | ICD-10-CM

## 2014-08-24 DIAGNOSIS — E669 Obesity, unspecified: Secondary | ICD-10-CM

## 2014-08-24 DIAGNOSIS — Z114 Encounter for screening for human immunodeficiency virus [HIV]: Secondary | ICD-10-CM

## 2014-08-24 NOTE — Progress Notes (Signed)
HPI:  HM: -HIV - wants to do today -colon ca screening: wants to do at [facility] this time, reports has been 10 year -shingles: wants to check to check on insurance first -mammo: scheduled per her report  Follow up:  HTN: -meds: lisinopril 20 -denies: CP, SOB, DOE, HA  HLD: -meds: none, fish oil -advised diet and exercise -lifestyle: walking 3 days per week; has cut out sodas  Obesity/Prediabetes: -meds: none -lifestyle: see above -denies: polyuria, polydipsia, vision changes  Asthma/Allergies: -meds: dulera, albterol prn, singulair, patanase, flonase -denies: CP, SOB, wheezing, sinus pain  ROS: See pertinent positives and negatives per HPI.  Past Medical History  Diagnosis Date  . Asthma   . Hyperlipidemia   . Hearing loss   . Hypertension   . Sleep apnea     uses c-pap  . Blindness, legal     L EYE  . Obesity, Class III, BMI 40-49.9 (morbid obesity) 06/03/2011  . Lapband May 2013 11/18/2011  . Migraines     Past Surgical History  Procedure Laterality Date  . Eye surgery      Patient unsure of surgery date. Left eye  . Knee surgery  1992    right  . Breath tek h pylori  08/17/2011    Procedure: BREATH TEK H PYLORI;  Surgeon: Pedro Earls, MD;  Location: [facility];  Service: General;  Laterality: N/A;  PATIENT WILL COME AT 0715  . Laparoscopic gastric banding  10/20/2011    Procedure: LAPAROSCOPIC GASTRIC BANDING;  Surgeon: Pedro Earls, MD;  Location: [facility];  Service: General;  Laterality: N/A;  . Abdominal hysterectomy  2006    fibroids    Family History  Problem Relation Age of Onset  . Cancer Mother     breast  . Breast cancer Mother   . Alcohol abuse Father   . Heart disease Father 63    MI  . Hypertension Father     History   Social History  . Marital Status: Married    Spouse Name: N/A  . Number of Children: N/A  . Years of Education: N/A   Social History Main Topics  . Smoking status: Former Smoker -- 0.50 packs/day for 15  years    Types: Cigarettes    Quit date: 06/02/1994  . Smokeless tobacco: Never Used  . Alcohol Use: No  . Drug Use: No  . Sexual Activity: Not on file   Other Topics Concern  . 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  Albuterol (VENTOLIN IN), Inhale 2 puffs into the lungs every 6 (six) hours as needed. For shortness of breath, Disp: , Rfl:  .  Calcium Citrate-Vitamin D (CALCIUM CITRATE + PO), Take 500 mg by mouth 3 (three) times daily., Disp: , Rfl:  .  Cholecalciferol (VITAMIN D-3 PO), Take by mouth daily., Disp: , Rfl:  .  Fish Oil OIL, by Does not apply route daily., Disp: , Rfl:  .  fluticasone (FLONASE) 50 MCG/ACT nasal spray, Place  2 sprays into both nostrils daily., Disp: 16 g, Rfl: 1 .  lisinopril (PRINIVIL,ZESTRIL) 20 MG tablet, TAKE 1 TABLET (20 MG TOTAL) BY MOUTH DAILY., Disp: 90 tablet, Rfl: 1 .  Mometasone Furo-Formoterol Fum (DULERA IN), Inhale 2 puffs into the lungs 2 (two) times daily. , Disp: , Rfl:  .  Multiple Vitamins-Minerals (MULTIVITAMIN WITH MINERALS) tablet, Take 1 tablet by mouth daily with breakfast. , Disp: , Rfl:  .  Olopatadine HCl (PATANASE) 0.6 % SOLN, Place 2 puffs into the nose daily with breakfast. , Disp: , Rfl:  .  SINGULAIR 10 MG tablet, Take 20 mg by mouth at bedtime. , Disp: , Rfl:   EXAM:  Filed Vitals:   08/24/14 1306  BP: 138/82  Pulse: 87  Temp: 98.3 F (36.8 C)    Body mass index is 39.2 kg/(m^2).  GENERAL: vitals reviewed and listed above, alert, oriented, appears well hydrated and in no acute distress  HEENT: atraumatic, conjunttiva clear, no obvious  abnormalities on inspection of external nose and ears  NECK: no obvious masses on inspection  LUNGS: clear to auscultation bilaterally, no wheezes, rales or rhonchi, good air movement  CV: HRRR, no peripheral edema  MS: moves all extremities without noticeable abnormality  PSYCH: pleasant and cooperative, no obvious depression or anxiety  ASSESSMENT AND PLAN:  Discussed the following assessment and plan:  Essential hypertension, benign - Plan: Basic metabolic panel  Hyperlipemia - Plan: Lipid Panel  Obesity  Encounter for screening for HIV - Plan: HIV antibody (with reflex)  Asthma with acute exacerbation, mild persistent  Colon cancer screening - Plan: Ambulatory referral to Gastroenterology  -Patient advised to return or notify a doctor immediately if symptoms worsen or persist or new concerns arise.  Patient Instructions  BEFORE YOU LEAVE: -schedule lab appt next week to come for fasting labs -follow up 4 months  -We placed a referral for you as discussed for your colonoscopy. It usually takes about 1-2 weeks to process and schedule this referral. If you have not heard from [place] regarding this appointment in 2 weeks please contact our office.  -get your mammogram  -lets [place] know when you want to do the shingles vaccine      Yuleni Burich R.

## 2014-08-24 NOTE — Progress Notes (Signed)
Pre visit review using our clinic review tool, if applicable. No additional management support is needed unless otherwise documented below in the visit note. 

## 2014-08-24 NOTE — Patient Instructions (Addendum)
BEFORE YOU LEAVE: -schedule lab appt next week to come for fasting labs -follow up 4 months  -We placed a referral for you as discussed for your colonoscopy. It usually takes about 1-2 weeks to process and schedule this referral. If you have not heard from [place] regarding this appointment in 2 weeks please contact our office.  -get your mammogram  -lets [place] know when you want to do the shingles vaccine  We recommend the following healthy lifestyle measures: - eat a healthy diet consisting of lots of vegetables, fruits, beans, nuts, seeds, healthy meats such as white chicken and fish and whole grains.  - avoid fried foods, fast food, processed foods, sodas, red meet and other fattening foods.  - get a least 150 minutes of aerobic exercise per week.

## 2014-08-29 ENCOUNTER — Other Ambulatory Visit: Payer: 59

## 2014-09-28 ENCOUNTER — Encounter: Payer: Self-pay | Admitting: Family Medicine

## 2014-10-01 ENCOUNTER — Encounter: Payer: Self-pay | Admitting: Pulmonary Disease

## 2014-10-01 ENCOUNTER — Ambulatory Visit (INDEPENDENT_AMBULATORY_CARE_PROVIDER_SITE_OTHER): Payer: 59 | Admitting: Pulmonary Disease

## 2014-10-01 VITALS — BP 136/84 | HR 85 | Ht 63.0 in | Wt 214.8 lb

## 2014-10-01 DIAGNOSIS — G4733 Obstructive sleep apnea (adult) (pediatric): Secondary | ICD-10-CM

## 2014-10-01 NOTE — Assessment & Plan Note (Signed)
The patient continues to do well with her C Pap device, and feels that it is helping her sleep and daytime alertness. Asked her to continue on her device, and to keep up with her mask changes and supplies. I also encouraged her to work aggressively on weight loss.

## 2014-10-01 NOTE — Progress Notes (Signed)
   Subjective:    Patient ID: [name], female    DOB: 01/02/1956, 59 y.o.   MRN: [id number]  HPI Patient comes in today for follow-up of her obstructive sleep apnea. She has been wearing C Pap compliantly, and is having no issues with her mask fit or pressure. She feels that she sleeps well with the device, and is satisfied with her daytime alertness. Her weight is down 1 pound from the last visit.   Review of Systems  Constitutional: Negative for fever and unexpected weight change.  HENT: Negative for congestion, dental problem, ear pain, nosebleeds, postnasal drip, rhinorrhea, sinus pressure, sneezing, sore throat and trouble swallowing.   Eyes: Negative for redness and itching.  Respiratory: Negative for cough, chest tightness, shortness of breath and wheezing.   Cardiovascular: Negative for palpitations and leg swelling.  Gastrointestinal: Negative for nausea and vomiting.  Genitourinary: Negative for dysuria.  Musculoskeletal: Negative for joint swelling.  Skin: Negative for rash.  Neurological: Negative for headaches.  Hematological: Does not bruise/bleed easily.  Psychiatric/Behavioral: Negative for dysphoric mood. The patient is not nervous/anxious.        Objective:   Physical Exam Obese female in no acute distress Nose without purulence or discharge noted No skin breakdown or pressure necrosis from the C Pap mask Neck without lymphadenopathy or thyromegaly Lower extremities with minimal edema, no cyanosis Alert and oriented, moves all 4 extremities.       Assessment & Plan:

## 2014-10-01 NOTE — Patient Instructions (Signed)
Continue on cpap, and keep up with mask changes and supplies. Work on weight loss followup with me again in one year.  

## 2014-10-08 ENCOUNTER — Other Ambulatory Visit: Payer: Self-pay | Admitting: Obstetrics and Gynecology

## 2014-10-10 LAB — CYTOLOGY - PAP

## 2014-12-25 ENCOUNTER — Ambulatory Visit: Payer: 59 | Admitting: Family Medicine

## 2014-12-31 ENCOUNTER — Other Ambulatory Visit: Payer: 59

## 2015-01-01 ENCOUNTER — Encounter: Payer: Self-pay | Admitting: Family Medicine

## 2015-01-01 ENCOUNTER — Encounter: Payer: Self-pay | Admitting: *Deleted

## 2015-01-01 ENCOUNTER — Ambulatory Visit (INDEPENDENT_AMBULATORY_CARE_PROVIDER_SITE_OTHER): Payer: 59 | Admitting: Family Medicine

## 2015-01-01 VITALS — BP 136/88 | HR 85 | Temp 98.7°F | Ht 63.0 in | Wt 213.3 lb

## 2015-01-01 DIAGNOSIS — I10 Essential (primary) hypertension: Secondary | ICD-10-CM | POA: Diagnosis not present

## 2015-01-01 DIAGNOSIS — E785 Hyperlipidemia, unspecified: Secondary | ICD-10-CM

## 2015-01-01 DIAGNOSIS — E669 Obesity, unspecified: Secondary | ICD-10-CM

## 2015-01-01 NOTE — Patient Instructions (Addendum)
BEFORE YOU LEAVE: -schedule lab appointment for fasting labs sometime in the next week -schedule physical in 4 months  Please call to schedule your colonoscopy: [facility]  Address: [address], [place], [place] [address]  [phone]  We recommend the following healthy lifestyle measures: - eat a healthy diet consisting of lots of vegetables, fruits, beans, nuts, seeds, healthy meats such as white chicken and fish and whole grains.  - avoid fried foods, fast food, processed foods, sodas, red meet and other fattening foods.  - get a least 150 minutes of aerobic exercise per week.

## 2015-01-01 NOTE — Progress Notes (Signed)
Pre visit review using our clinic review tool, if applicable. No additional management support is needed unless otherwise documented below in the visit note. 

## 2015-01-01 NOTE — Progress Notes (Signed)
HPI:  HM: -colon ca screening: lost number to call to schedule -shingles: wants to check to check on insurance first -mammo: scheduled per her report  Follow up:  HTN: -meds: lisinopril 20 - but she forgot to take today and BP up a little -denies: CP, SOB, DOE, HA  HLD: -meds: none, fish oil -advised diet and exercise -lifestyle: walking 3 days per week; has cut out sodas  Obesity/Prediabetes: -meds: none -lifestyle: see above -denies: polyuria, polydipsia, vision changes  Asthma/OSA/Allergies: -meds: dulera, albterol prn, singulair, patanase, flonase -sees pulmonologist -denies: CP, SOB, wheezing, sinus pain   ROS: See pertinent positives and negatives per HPI.  Past Medical History  Diagnosis Date  . Asthma   . Hyperlipidemia   . Hearing loss   . Hypertension   . Sleep apnea     uses c-pap  . Blindness, legal     L EYE  . Obesity, Class III, BMI 40-49.9 (morbid obesity) 06/03/2011  . Lapband May 2013 11/18/2011  . Migraines     Past Surgical History  Procedure Laterality Date  . Eye surgery      Patient unsure of surgery date. Left eye  . Knee surgery  1992    right  . Breath tek h pylori  08/17/2011    Procedure: BREATH TEK H PYLORI;  Surgeon: Pedro Earls, MD;  Location: [facility];  Service: General;  Laterality: N/A;  PATIENT WILL COME AT 0715  . Laparoscopic gastric banding  10/20/2011    Procedure: LAPAROSCOPIC GASTRIC BANDING;  Surgeon: Pedro Earls, MD;  Location: [facility];  Service: General;  Laterality: N/A;  . Abdominal hysterectomy  2006    fibroids    Family History  Problem Relation Age of Onset  . Cancer Mother     breast  . Breast cancer Mother   . Alcohol abuse Father   . Heart disease Father 20    MI  . Hypertension Father     History   Social History  . Marital Status: Married    Spouse Name: N/A  . Number of Children: N/A  . Years of Education: N/A   Social History Main Topics  . Smoking status: Former Smoker --  0.50 packs/day for 15 years    Types: Cigarettes    Quit date: 06/02/1994  . Smokeless tobacco: Never Used  . Alcohol Use: No  . Drug Use: No  . Sexual Activity: Not on file   Other Topics Concern  . 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  Albuterol (VENTOLIN IN), Inhale 2 puffs into the lungs every 6 (six) hours as needed. For shortness of breath, Disp: , Rfl:  .  Calcium Citrate-Vitamin D (CALCIUM CITRATE + PO), Take 500 mg by mouth 3 (three) times daily., Disp: , Rfl:  .  Cholecalciferol (VITAMIN D-3 PO), Take by mouth daily., Disp: , Rfl:  .  Fish Oil OIL, by Does not apply route daily., Disp: , Rfl:  .  fluticasone (FLONASE) 50 MCG/ACT  nasal spray, Place 2 sprays into both nostrils daily., Disp: 16 g, Rfl: 1 .  lisinopril (PRINIVIL,ZESTRIL) 20 MG tablet, TAKE 1 TABLET (20 MG TOTAL) BY MOUTH DAILY., Disp: 90 tablet, Rfl: 1 .  Mometasone Furo-Formoterol Fum (DULERA IN), Inhale 2 puffs into the lungs 2 (two) times daily. , Disp: , Rfl:  .  Multiple Vitamins-Minerals (MULTIVITAMIN WITH MINERALS) tablet, Take 1 tablet by mouth daily with breakfast. , Disp: , Rfl:  .  Olopatadine HCl (PATANASE) 0.6 % SOLN, Place 2 puffs into the nose daily with breakfast. , Disp: , Rfl:  .  SINGULAIR 10 MG tablet, Take 20 mg by mouth at bedtime. , Disp: , Rfl:   EXAM:  Filed Vitals:   01/01/15 1452  BP: 136/88  Pulse: 85  Temp: 98.7 F (37.1 C)    Body mass index is 37.79 kg/(m^2).  GENERAL: vitals reviewed and listed above, alert, oriented, appears well hydrated and in no acute distress  HEENT: atraumatic,  conjunttiva clear, no obvious abnormalities on inspection of external nose and ears  NECK: no obvious masses on inspection  LUNGS: clear to auscultation bilaterally, no wheezes, rales or rhonchi, good air movement  CV: HRRR, no peripheral edema  MS: moves all extremities without noticeable abnormality  PSYCH: pleasant and cooperative, no obvious depression or anxiety  ASSESSMENT AND PLAN:  Discussed the following assessment and plan:  Essential hypertension, benign - Plan: Hemoglobin A1C  Hyperlipemia - Plan: Lipid Panel  Obesity -consider metformin for prediabetes and obesity - risks discussed -Patient advised to return or notify a doctor immediately if symptoms worsen or persist or new concerns arise.  Patient Instructions  BEFORE YOU LEAVE: -schedule lab appointment for fasting labs sometime in the next week -schedule physical in 4 months  Please call to schedule your colonoscopy: [facility]  Address: [address], [place], [place] [address]  [phone]  We recommend the following healthy lifestyle measures: - eat a healthy diet consisting of lots of vegetables, fruits, beans, nuts, seeds, healthy meats such as white chicken and fish and whole grains.  - avoid fried foods, fast food, processed foods, sodas, red meet and other fattening foods.  - get a least 150 minutes of aerobic exercise per week.       Colin Benton R.

## 2015-01-10 ENCOUNTER — Other Ambulatory Visit: Payer: 59

## 2015-01-10 LAB — LIPID PANEL
CHOL/HDL RATIO: 5
Cholesterol: 221 mg/dL — ABNORMAL HIGH (ref 0–200)
HDL: 41.6 mg/dL (ref >39.00)
LDL Cholesterol: 140 mg/dL — ABNORMAL HIGH (ref 0–99)
NonHDL: 179.63
Triglycerides: 198 mg/dL — ABNORMAL HIGH (ref 0.0–149.0)
VLDL: 39.6 mg/dL (ref 0.0–40.0)

## 2015-01-10 LAB — HEMOGLOBIN A1C: Hgb A1c MFr Bld: 5.8 % (ref 4.6–6.5)

## 2015-01-17 MED ORDER — PRAVASTATIN SODIUM 20 MG PO TABS: 20.0000 mg | ORAL_TABLET | Freq: Every day | ORAL | Status: DC

## 2015-01-17 NOTE — Addendum Note (Signed)
Addended by: Agnes Lawrence on: 01/17/2015 03:49 PM   Modules accepted: Orders

## 2015-04-01 ENCOUNTER — Other Ambulatory Visit: Payer: Self-pay

## 2015-04-01 MED ORDER — PRAVASTATIN SODIUM 20 MG PO TABS: 20.0000 mg | ORAL_TABLET | Freq: Every day | ORAL | Status: DC

## 2015-04-30 ENCOUNTER — Other Ambulatory Visit: Payer: Self-pay | Admitting: Family Medicine

## 2015-05-02 ENCOUNTER — Ambulatory Visit: Payer: 59 | Admitting: *Deleted

## 2015-05-03 ENCOUNTER — Encounter: Payer: Self-pay | Admitting: Family Medicine

## 2015-05-03 ENCOUNTER — Ambulatory Visit (INDEPENDENT_AMBULATORY_CARE_PROVIDER_SITE_OTHER): Payer: 59 | Admitting: Family Medicine

## 2015-05-03 VITALS — BP 126/80 | HR 85 | Temp 98.0°F | Ht 61.25 in | Wt 215.2 lb

## 2015-05-03 DIAGNOSIS — Z1159 Encounter for screening for other viral diseases: Secondary | ICD-10-CM

## 2015-05-03 DIAGNOSIS — E785 Hyperlipidemia, unspecified: Secondary | ICD-10-CM

## 2015-05-03 DIAGNOSIS — I10 Essential (primary) hypertension: Secondary | ICD-10-CM

## 2015-05-03 DIAGNOSIS — Z23 Encounter for immunization: Secondary | ICD-10-CM

## 2015-05-03 DIAGNOSIS — G4733 Obstructive sleep apnea (adult) (pediatric): Secondary | ICD-10-CM

## 2015-05-03 DIAGNOSIS — Z0000 Encounter for general adult medical examination without abnormal findings: Secondary | ICD-10-CM

## 2015-05-03 DIAGNOSIS — J4520 Mild intermittent asthma, uncomplicated: Secondary | ICD-10-CM

## 2015-05-03 LAB — BASIC METABOLIC PANEL
BUN: 12 mg/dL (ref 6–23)
CALCIUM: 9.5 mg/dL (ref 8.4–10.5)
CHLORIDE: 106 meq/L (ref 96–112)
CO2: 28 meq/L (ref 19–32)
CREATININE: 0.72 mg/dL (ref 0.40–1.20)
GFR: 105.68 mL/min (ref >60.00)
GLUCOSE: 78 mg/dL (ref 70–99)
Potassium: 4 mEq/L (ref 3.5–5.1)
Sodium: 143 mEq/L (ref 135–145)

## 2015-05-03 NOTE — Progress Notes (Signed)
Pre visit review using our clinic review tool, if applicable. No additional management support is needed unless otherwise documented below in the visit note. 

## 2015-05-03 NOTE — Patient Instructions (Signed)
BEFORE YOU LEAVE: -vaccines -labs -follow up in 4 months  -We have ordered labs or studies at this visit. It can take up to 1-2 weeks for results and processing. We will contact you with instructions IF your results are abnormal. Normal results will be released to your [facility]. If you have not heard from [place] or can not find your results in [facility] in 2 weeks please contact our office.  We recommend the following healthy lifestyle measures: - eat a healthy whole foods diet consisting of regular small meals composed of vegetables, fruits, beans, nuts, seeds, healthy meats such as white chicken and fish and whole grains.  - avoid sweets, white starchy foods, fried foods, fast food, processed foods, sodas, red meet and other fattening foods.  - get a least 150-300 minutes of aerobic exercise per week.

## 2015-05-03 NOTE — Progress Notes (Signed)
HPI:  Here for CPE:  -Concerns and/or follow up today:   HTN: -meds: lisinopril 20 - but she forgot to take today and BP up a little -denies: CP, SOB, DOE, HA  HLD: -meds: none, fish oil -advised diet and exercise -lifestyle: no exercise now - but did join a gym, lifestyle so so  Obesity/Prediabetes: -meds: none -lifestyle: see above -denies: polyuria, polydipsia, vision changes  Asthma/OSA/Allergies: -meds: dulera, albterol prn, singulair, patanase, flonase -sees pulmonologist -denies: CP, SOB, wheezing, sinus pain  -Diet: variety of foods, balance and well rounded, larger portion sizes  -Exercise: no regular exercise  -Taking folic acid, vitamin D or calcium: no  -Diabetes and Dyslipidemia Screening:  -Hx of HTN: no  -Vaccines: UTD  -pap history: 09/2014 with gyn  -sexual activity: yes, female partner, no new partners  -wants STI testing (Hep C if born 68-65): no  -FH breast, colon or ovarian ca: see FH Last mammogram: Last colon cancer screening:  Breast Ca Risk Assessment: -SolutionApps.it  Genetic Counseling Screen: Http://www.breastcancergenesscreen.org/startScreen.aspx  FRAX (50-65):  DEXA (>/= 65):   -Alcohol, Tobacco, drug use: see social history  Review of Systems - no fevers, unintentional weight loss, vision loss, hearing loss, chest pain, sob, hemoptysis, melena, hematochezia, hematuria, genital discharge, changing or concerning skin lesions, bleeding, bruising, loc, thoughts of self harm or SI  Past Medical History  Diagnosis Date  . Asthma   . Hyperlipidemia   . Hearing loss   . Hypertension   . Sleep apnea     uses c-pap  . Blindness, legal     L EYE  . Obesity, Class III, BMI 40-49.9 (morbid obesity) ([place]) 06/03/2011  . Lapband May 2013 11/18/2011  . Migraines     Past Surgical History  Procedure Laterality Date  . Eye surgery      Patient unsure of surgery date. Left eye  . Knee surgery  1992    right   . Breath tek h pylori  08/17/2011    Procedure: BREATH TEK H PYLORI;  Surgeon: Pedro Earls, MD;  Location: [facility];  Service: General;  Laterality: N/A;  PATIENT WILL COME AT 0715  . Laparoscopic gastric banding  10/20/2011    Procedure: LAPAROSCOPIC GASTRIC BANDING;  Surgeon: Pedro Earls, MD;  Location: [facility];  Service: General;  Laterality: N/A;  . Abdominal hysterectomy  2006    fibroids    Family History  Problem Relation Age of Onset  . Cancer Mother     breast  . Breast cancer Mother   . Alcohol abuse Father   . Heart disease Father 75    MI  . Hypertension Father     Social History   Social History  . Marital Status: Married    Spouse Name: N/A  . Number of Children: N/A  . Years of Education: N/A   Social History Main Topics  . Smoking status: Former Smoker -- 0.50 packs/day for 15 years    Types: Cigarettes    Quit date: 06/02/1994  . Smokeless tobacco: Never Used  . Alcohol Use: No  . Drug Use: No  . Sexual Activity: Not Asked   Other Topics Concern  . 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  Albuterol (VENTOLIN IN), Inhale 2 puffs into the lungs every 6 (six) hours as needed. For shortness of breath, Disp: , Rfl:  .  Calcium Citrate-Vitamin D (CALCIUM CITRATE + PO), Take 500 mg by mouth 3 (three) times daily., Disp: , Rfl:  .  Cholecalciferol (VITAMIN D-3 PO), Take by mouth daily., Disp: , Rfl:  .  fluticasone (FLONASE) 50 MCG/ACT nasal spray, Place 2 sprays into both nostrils daily., Disp: 16 g, Rfl: 1 .  lisinopril (PRINIVIL,ZESTRIL) 20 MG tablet, TAKE 1 TABLET (20  MG TOTAL) BY MOUTH DAILY., Disp: 90 tablet, Rfl: 1 .  Mometasone Furo-Formoterol Fum (DULERA IN), Inhale 2 puffs into the lungs 2 (two) times daily. , Disp: , Rfl:  .  Multiple Vitamins-Minerals (MULTIVITAMIN WITH MINERALS) tablet, Take 1 tablet by mouth daily with breakfast. , Disp: , Rfl:  .  Olopatadine HCl (PATANASE) 0.6 % SOLN, Place 2 puffs into the nose daily with breakfast. , Disp: , Rfl:  .  pravastatin (PRAVACHOL) 20 MG tablet, Take 1 tablet (20 mg total) by mouth daily., Disp: 30 tablet, Rfl: 3 .  SINGULAIR 10 MG tablet, Take 20 mg by mouth at bedtime. , Disp: , Rfl:   EXAM:  Filed Vitals:   05/03/15 0825  BP: 126/80  Pulse: 85  Temp: 98 F (36.7 C)  Body mass index is 40.32 kg/(m^2).   GENERAL: vitals reviewed and listed below, alert, oriented, appears well hydrated and in no acute distress  HEENT: head atraumatic, PERRLA, normal appearance of eyes, ears, nose and mouth. moist mucus membranes.  NECK: supple, no masses or lymphadenopathy  LUNGS: clear to auscultation bilaterally, no rales, rhonchi or wheeze  CV: HRRR, no peripheral edema or cyanosis, normal pedal pulses  BREAST: declined  ABDOMEN: bowel sounds normal, soft, non tender to palpation, no masses, no rebound or guarding  GU: declined  SKIN: no rash or abnormal lesions on exposed skin  MS: normal gait, moves all extremities normally  NEURO: CN II-XII grossly intact, normal muscle strength and sensation to light touch on extremities  PSYCH: normal affect, pleasant and cooperative  ASSESSMENT AND PLAN:  Discussed the following assessment and plan:  Visit for preventive health examination  Essential hypertension, benign - Plan: Basic metabolic panel  Morbid obesity, unspecified obesity type ([place])  Hyperlipemia  OSA (obstructive sleep apnea)  Asthma, mild intermittent, uncomplicated  Need for hepatitis C screening test - Plan: Hepatitis C antibody  -not fasting, will plan to check  cholesterol next visit -completed health form for work  -Discussed and advised all [place] preventive services health task force level A and B recommendations for age, sex and risks.  -Advised at least 150 minutes of exercise per week and a healthy diet low in saturated fats and sweets and consisting of fresh fruits and vegetables, lean meats such as fish and white chicken and whole grains.  -labs, studies and vaccines per orders this encounter  Orders Placed This Encounter  Procedures  . Hepatitis C antibody  . Basic metabolic panel    Patient advised to return to clinic immediately if symptoms worsen or persist or new concerns.  Patient Instructions  BEFORE YOU LEAVE: -vaccines -labs -follow up in 4 months  -We have ordered labs or studies at this visit. It can take  up to 1-2 weeks for results and processing. We will contact you with instructions IF your results are abnormal. Normal results will be released to your [facility]. If you have not heard from [place] or can not find your results in [facility] in 2 weeks please contact our office.  We recommend the following healthy lifestyle measures: - eat a healthy whole foods diet consisting of regular small meals composed of vegetables, fruits, beans, nuts, seeds, healthy meats such as white chicken and fish and whole grains.  - avoid sweets, white starchy foods, fried foods, fast food, processed foods, sodas, red meet and other fattening foods.  - get a least 150-300 minutes of aerobic exercise per week.           No Follow-up on file.  Colin Benton R.

## 2015-05-03 NOTE — Addendum Note (Signed)
Addended by: Agnes Lawrence on: 05/03/2015 09:28 AM   Modules accepted: Orders

## 2015-05-04 LAB — HEPATITIS C ANTIBODY: HCV AB: NEGATIVE

## 2015-07-09 ENCOUNTER — Encounter: Payer: Self-pay | Admitting: Podiatry

## 2015-07-09 ENCOUNTER — Ambulatory Visit (INDEPENDENT_AMBULATORY_CARE_PROVIDER_SITE_OTHER): Payer: 59

## 2015-07-09 ENCOUNTER — Ambulatory Visit (INDEPENDENT_AMBULATORY_CARE_PROVIDER_SITE_OTHER): Payer: 59 | Admitting: Podiatry

## 2015-07-09 VITALS — BP 147/78 | HR 79 | Resp 16 | Ht 63.0 in | Wt 215.0 lb

## 2015-07-09 DIAGNOSIS — M79671 Pain in right foot: Secondary | ICD-10-CM | POA: Diagnosis not present

## 2015-07-09 DIAGNOSIS — M779 Enthesopathy, unspecified: Secondary | ICD-10-CM

## 2015-07-09 DIAGNOSIS — M7751 Other enthesopathy of right foot: Secondary | ICD-10-CM | POA: Diagnosis not present

## 2015-07-09 DIAGNOSIS — M778 Other enthesopathies, not elsewhere classified: Secondary | ICD-10-CM

## 2015-07-09 NOTE — Progress Notes (Signed)
   Subjective:    Patient ID: Rebecca [name], female    DOB: Dec 23, 1955, 60 y.o.   MRN: [id number]  HPI: She presents today as a new patient with a chief complaint of pain to the dorsal lateral aspect of the right foot without trauma. She states that it has been hurting her for 4-6 months denies trauma and denies any treatment.    Review of Systems  All other systems reviewed and are negative.      Objective:   Physical Exam: 60 year old female vital signs stable alert and oriented 3. Pulses are strongly palpable. Neurologic sensorium is intact. Deep tendon reflexes are intact. Muscle strength is intact. Orthopedic evaluation and straight Salter Asst. Michael full range of motion or crepitation. She does have some tenderness on range of motion, particularly frontal plane range of motion of Lisfranc's joints at the level of the fourth fifth metatarsocuboid articulation right foot. There is no overlying erythema saline as drainage or odor. Radiographs do demonstrate some early osteoarthritic changes otherwise no major abnormalities and no fractures identified.        Assessment & Plan:  Capsulitis fourth fifth met cuboid articulation right foot.  Plan: Injected the area today with Kenalog and local anesthetic. I will follow-up with her in 4-6 weeks.

## 2015-08-06 ENCOUNTER — Ambulatory Visit: Payer: 59 | Admitting: Podiatry

## 2015-08-13 ENCOUNTER — Ambulatory Visit: Payer: 59 | Admitting: Podiatry

## 2015-08-22 ENCOUNTER — Ambulatory Visit: Payer: 59 | Admitting: Allergy and Immunology

## 2015-08-24 ENCOUNTER — Other Ambulatory Visit: Payer: Self-pay | Admitting: Family Medicine

## 2015-08-30 ENCOUNTER — Ambulatory Visit (INDEPENDENT_AMBULATORY_CARE_PROVIDER_SITE_OTHER): Payer: 59 | Admitting: Allergy and Immunology

## 2015-08-30 ENCOUNTER — Encounter: Payer: Self-pay | Admitting: Allergy and Immunology

## 2015-08-30 VITALS — BP 130/90 | HR 74 | Temp 98.2°F | Resp 16

## 2015-08-30 DIAGNOSIS — J4540 Moderate persistent asthma, uncomplicated: Secondary | ICD-10-CM | POA: Diagnosis not present

## 2015-08-30 DIAGNOSIS — J310 Chronic rhinitis: Secondary | ICD-10-CM | POA: Diagnosis not present

## 2015-08-30 MED ORDER — MONTELUKAST SODIUM 10 MG PO TABS: ORAL_TABLET | ORAL | Status: DC

## 2015-08-30 MED ORDER — MOMETASONE FURO-FORMOTEROL FUM 200-5 MCG/ACT IN AERO: INHALATION_SPRAY | RESPIRATORY_TRACT | Status: DC

## 2015-08-30 MED ORDER — OLOPATADINE HCL 0.6 % NA SOLN: NASAL | Status: DC

## 2015-08-30 MED ORDER — ALBUTEROL SULFATE HFA 108 (90 BASE) MCG/ACT IN AERS: INHALATION_SPRAY | RESPIRATORY_TRACT | Status: DC

## 2015-08-30 NOTE — Patient Instructions (Signed)
   Continue Dulera, Singulair and Patanase daily.  As needed Zyrtec and Ventolin.  Saline nasal wash each evening in the shower and as needed.  Call with any recurring Ventolin use.  Follow-up in 4-6 months or sooner if needed.

## 2015-08-30 NOTE — Progress Notes (Signed)
FOLLOW UP NOTE  RE: Rebecca [name] MRN: [id number] DOB: Sep 22, 1955 [facility] [place] [address] [place] [id number] Date of Office Visit: 08/30/2015  Subjective:  Rebecca [name] is a 61 y.o. female who presents today for Asthma  Assessment:   1. Moderate persistent asthma, well controlled.   2. Mixed rhinitis.   3.      History of hypertension on lisinopril. 4.      Complex medical history including sleep apnea on CPAP(followed by pulmonology) and overweight. Plan:   Meds ordered this encounter  Medications  . montelukast (SINGULAIR) 10 MG tablet    Sig: Take one tablet each evening to prevent cough or wheeze.    Dispense:  30 tablet    Refill:  5  . albuterol (VENTOLIN HFA) 108 (90 Base) MCG/ACT inhaler    Sig: Use 2 puffs every 4 hours as needed for cough or wheeze.  May use  2 puffs 10-20 minutes prior to exercise.    Dispense:  1 Inhaler    Refill:  1  . Olopatadine HCl (PATANASE) 0.6 % SOLN    Sig: Use 1-2 sprays in each nostril twice daily.    Dispense:  1 Bottle    Refill:  5  . mometasone-formoterol (DULERA) 200-5 MCG/ACT AERO    Sig: Use 2 puffs twice daily to prevent cough or wheeze.  Rinse, gargle, and spit after use.    Dispense:  1 Inhaler    Refill:  5   Patient Instructions  1.  Continue Dulera, Singulair and Patanase daily. 2.  As needed Zyrtec and Ventolin. 3.  Saline nasal wash each evening in the shower and as needed. 4.  Call with any recurring Ventolin use. 5.  Follow-up in 4-6 months or sooner if needed. 6.  Encourage continue activity/exercise and decrease weight as guided by primary care physician.  HPI: Rebecca [name] returns to the office in follow-up of mixed rhinitis and persistent asthma, last office visit September 2016.  Rebecca [name] describes feeling very good overall without cough, wheeze, shortness of breath, difficulty breathing or disrupted sleep or activity.  Rebecca [name] feels her medication regime is very beneficial  and Rebecca [name] prefers to continue with refills requested today.   Approximately 2 weeks ago Rebecca [name] had "head cold"with cough, nasal congestion, possibly slight wheeze without shortness of breath or difficulty breathing where Rebecca [name] used her Ventolin on a couple of occasions.  Rebecca [name] is feeling 100% improved now and has no other questions or concerns.  Rebecca [name] reports generally Ventolin use is less than once a week and feels her consistently using CPAP machine is also beneficial.  Rebecca [name] is exercising --walking, mostly without any additional concerns. Denies ED or urgent care visits, prednisone or antibiotic courses. Reports sleep and activity are normal.  Rebecca [name] has a current medication list which includes the following prescription(s): albuterol, calcium citrate-vitamin d, cholecalciferol, lisinopril, mometasone furo-formoterol, multivitamin with minerals, olopatadine hcl, pravastatin, singulair.   Drug Allergies: Allergies  Allergen Reactions  . Hydrocodone Hives, Shortness Of Breath and Itching  . Sulfa Antibiotics Hives   Objective:   Filed Vitals:   08/30/15 1157  BP: 130/90  Pulse: 74  Temp: 98.2 F (36.8 C)  Resp: 16   SpO2 Readings from Last 1 Encounters:  08/30/15 95%   Physical Exam  Constitutional: Rebecca [name] is well-developed, well-nourished, and in no distress.  HENT:  Head: Atraumatic.  Right Ear: Tympanic membrane and ear canal normal.  Left Ear: Tympanic membrane and  ear canal normal.  Nose: Mucosal edema present. No rhinorrhea. No epistaxis.  Mouth/Throat: Oropharynx is clear and moist and mucous membranes are normal. No oropharyngeal exudate, posterior oropharyngeal edema or posterior oropharyngeal erythema.  Neck: Neck supple.  Cardiovascular: Normal rate, S1 normal and S2 normal.   No murmur heard. Pulmonary/Chest: Effort normal. Rebecca [name] has no wheezes. Rebecca [name] has no rhonchi. Rebecca [name] has no rales.  Lymphadenopathy:    Rebecca [name] has no cervical adenopathy.   Diagnostics: Spirometry:  FVC 1.88--84%,   FEV1 1.43--78%.    Roselyn M. Ishmael Holter, MD  cc: Lucretia Kern., DO

## 2015-10-15 ENCOUNTER — Ambulatory Visit: Payer: 59 | Admitting: Pulmonary Disease

## 2015-10-23 ENCOUNTER — Encounter: Payer: Self-pay | Admitting: Obstetrics and Gynecology

## 2015-10-23 LAB — HM MAMMOGRAPHY

## 2015-10-23 LAB — HM PAP SMEAR: HM PAP: NEGATIVE

## 2016-01-03 ENCOUNTER — Ambulatory Visit: Payer: 59 | Admitting: Pulmonary Disease

## 2016-01-30 ENCOUNTER — Ambulatory Visit: Payer: 59 | Admitting: Allergy & Immunology

## 2016-03-19 ENCOUNTER — Encounter: Payer: Self-pay | Admitting: Family Medicine

## 2016-03-19 ENCOUNTER — Ambulatory Visit (INDEPENDENT_AMBULATORY_CARE_PROVIDER_SITE_OTHER): Payer: 59 | Admitting: Family Medicine

## 2016-03-19 VITALS — BP 128/82 | HR 74 | Temp 98.2°F | Ht 63.0 in | Wt 215.7 lb

## 2016-03-19 DIAGNOSIS — M791 Myalgia: Secondary | ICD-10-CM | POA: Diagnosis not present

## 2016-03-19 DIAGNOSIS — M7918 Myalgia, other site: Secondary | ICD-10-CM

## 2016-03-19 MED ORDER — TIZANIDINE HCL 4 MG PO TABS: ORAL_TABLET | ORAL | 0 refills | Status: DC

## 2016-03-19 NOTE — Patient Instructions (Signed)
BEFORE YOU LEAVE: -follow up: 1 month -home exercises  Do the exercises provided 4 days per week.  Aleve as needed per instructions for pain.  Muscle relaxer at night as needed.  Follow up sooner if worsening or other concerns.

## 2016-03-19 NOTE — Progress Notes (Addendum)
HPI:  L buttock pain: -Intermittently for greater than 1 year -Occasional flares in pain -This flare started about 2 weeks ago -Pain is intermittent, occurs with certain movements and is in the left but opted primarily, question occasionally radiating to the left upper leg -Aleve resolves the pain -Denies: Weakness, numbness, fevers, malaise, known injury, loss of bowel or bladder function  ROS: See pertinent positives and negatives per HPI.  Past Medical History:  Diagnosis Date  . Asthma   . Blindness, legal    L EYE  . Hearing loss   . Hyperlipidemia   . Hypertension   . Lapband May 2013 11/18/2011  . Migraines   . Obesity, Class III, BMI 40-49.9 (morbid obesity) ([place]) 06/03/2011  . Sleep apnea    uses c-pap    Past Surgical History:  Procedure Laterality Date  . ABDOMINAL HYSTERECTOMY  2006   fibroids  . BREATH TEK H PYLORI  08/17/2011   Procedure: BREATH TEK H PYLORI;  Surgeon: Pedro Earls, MD;  Location: [facility];  Service: General;  Laterality: N/A;  PATIENT WILL COME AT 0715  . EYE SURGERY     Patient unsure of surgery date. Left eye  . [place]   right  . LAPAROSCOPIC GASTRIC BANDING  10/20/2011   Procedure: LAPAROSCOPIC GASTRIC BANDING;  Surgeon: Pedro Earls, MD;  Location: [facility];  Service: General;  Laterality: N/A;    Family History  Problem Relation Age of Onset  . Cancer Mother     breast  . Breast cancer Mother   . Alcohol abuse Father   . Heart disease Father 46    MI  . Hypertension Father     Social History   Social History  . Marital status: Married    Spouse name: N/A  . Number of children: N/A  . Years of education: N/A   Social History Main Topics  . Smoking status: Former Smoker    Packs/day: 0.50    Years: 15.00    Types: Cigarettes    Quit date: 06/02/1994  . Smokeless tobacco: Never Used  . Alcohol use No  . Drug use: No  . Sexual activity: Not Asked   Other Topics Concern  . 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  albuterol (VENTOLIN HFA) 108 (90 Base) MCG/ACT inhaler, Use 2 puffs every 4 hours as needed for cough or wheeze.  May use  2 puffs 10-20 minutes prior to exercise., Disp: 1 Inhaler, Rfl: 1 .  Albuterol (VENTOLIN IN), Inhale 2 puffs into the lungs every 6 (six) hours as needed. For shortness of breath, Disp: , Rfl:  .  Calcium Citrate-Vitamin D (CALCIUM CITRATE + PO), Take 500 mg by mouth 3 (three) times daily., Disp: , Rfl:  .  Cholecalciferol (VITAMIN D-3 PO), Take by mouth daily., Disp: , Rfl:  .  fluticasone (FLONASE) 50 MCG/ACT nasal spray, Place 2 sprays into both nostrils daily., Disp: 16 g, Rfl: 1 .  lisinopril (PRINIVIL,ZESTRIL)  20 MG tablet, TAKE 1 TABLET (20 MG TOTAL) BY MOUTH DAILY., Disp: 90 tablet, Rfl: 0 .  Mometasone Furo-Formoterol Fum (DULERA IN), Inhale 2 puffs into the lungs 2 (two) times daily., Disp: , Rfl:  .  mometasone-formoterol (DULERA) 200-5 MCG/ACT AERO, Use 2 puffs twice daily to prevent cough or wheeze.  Rinse, gargle, and spit after use., Disp: 1 Inhaler, Rfl: 5 .  montelukast (SINGULAIR) 10 MG tablet, Take one tablet each evening to prevent cough or wheeze., Disp: 30 tablet, Rfl: 5 .  Multiple Vitamins-Minerals (MULTIVITAMIN WITH MINERALS) tablet, Take 1 tablet by mouth daily with breakfast. , Disp: , Rfl:  .  Olopatadine HCl (PATANASE) 0.6 % SOLN, Use 1-2 sprays in each nostril twice daily., Disp: 1 Bottle, Rfl: 5 .  pravastatin (PRAVACHOL) 20 MG tablet, Take 1 tablet (20 mg total) by mouth daily., Disp: 30 tablet, Rfl: 3 .  pravastatin (PRAVACHOL) 20 MG tablet, TAKE 1  TABLET (20 MG TOTAL) BY MOUTH DAILY., Disp: 90 tablet, Rfl: 0 .  tiZANidine (ZANAFLEX) 4 MG tablet, One half tablet before bed as needed for muscle pain/spasm., Disp: 14 tablet, Rfl: 0  EXAM:  Vitals:   03/19/16 0954  BP: 128/82  Pulse: 74  Temp: 98.2 F (36.8 C)    Body mass index is 38.21 kg/m.  GENERAL: vitals reviewed and listed above, alert, oriented, appears well hydrated and in no acute distress  HEENT: atraumatic, conjunttiva clear, no obvious abnormalities on inspection of external nose and ears  NECK: no obvious masses on inspection  MS:  Normal Gait Normal inspection of back, no obvious scoliosis or leg length descrepancy No bony TTP Soft tissue TTP at: In the area of the left puriform [place] muscle in the left buttock -/+ tests: neg trendelenburg,-facet loading, -SLRT, -CLRT, -FABER, -FADIR Normal muscle strength, sensation to light touch and DTRs in LEs bilaterally  PSYCH: pleasant and cooperative, no obvious depression or anxiety  ASSESSMENT AND PLAN:  Discussed the following assessment and plan:  Left buttock pain -we discussed possible serious and likely etiologies, workup and treatment, treatment risks and return precautions - mild radicular symptoms or piriformis syndrome -after this discussion, [place] opted for HEP, Aleve when necessary, muscle relaxer when necessary after discussion risks -follow up advised 1 month, sooner if needed  -of course, we advised [place]  to return or notify a doctor immediately if symptoms worsen or persist or new concerns arise.  Patient Instructions  BEFORE YOU LEAVE: -follow up: 1 month -home exercises  Do the exercises provided 4 days per week.  Aleve as needed per instructions for pain.  Muscle relaxer at night as needed.  Follow up sooner if worsening or other concerns.    Colin Benton R., DO

## 2016-03-19 NOTE — Progress Notes (Signed)
Pre visit review using our clinic review tool, if applicable. No additional management support is needed unless otherwise documented below in the visit note. 

## 2016-03-20 ENCOUNTER — Encounter (HOSPITAL_COMMUNITY): Payer: Self-pay

## 2016-03-20 ENCOUNTER — Other Ambulatory Visit: Payer: Self-pay | Admitting: *Deleted

## 2016-03-20 MED ORDER — MONTELUKAST SODIUM 10 MG PO TABS: ORAL_TABLET | ORAL | 0 refills | Status: DC

## 2016-03-23 ENCOUNTER — Encounter: Payer: Self-pay | Admitting: Pulmonary Disease

## 2016-03-23 ENCOUNTER — Ambulatory Visit (INDEPENDENT_AMBULATORY_CARE_PROVIDER_SITE_OTHER): Payer: 59 | Admitting: Pulmonary Disease

## 2016-03-23 VITALS — BP 142/92 | HR 83 | Ht 63.0 in | Wt 218.4 lb

## 2016-03-23 DIAGNOSIS — G4733 Obstructive sleep apnea (adult) (pediatric): Secondary | ICD-10-CM | POA: Diagnosis not present

## 2016-03-23 NOTE — Patient Instructions (Signed)
Will arrange for new card for CPAP machine and call with results of download  Follow up in 1 year

## 2016-03-23 NOTE — Progress Notes (Signed)
Current Outpatient Prescriptions on File Prior to Visit  Medication Sig  . albuterol (VENTOLIN HFA) 108 (90 Base) MCG/ACT inhaler Use 2 puffs every 4 hours as needed for cough or wheeze.  May use  2 puffs 10-20 minutes prior to exercise.  . Calcium Citrate-Vitamin D (CALCIUM CITRATE + PO) Take 500 mg by mouth 3 (three) times daily.  . Cholecalciferol (VITAMIN D-3 PO) Take by mouth daily.  . fluticasone (FLONASE) 50 MCG/ACT nasal spray Place 2 sprays into both nostrils daily.  [name] lisinopril (PRINIVIL,ZESTRIL) 20 MG tablet TAKE 1 TABLET (20 MG TOTAL) BY MOUTH DAILY.  . mometasone-formoterol (DULERA) 200-5 MCG/ACT AERO Use 2 puffs twice daily to prevent cough or wheeze.  Rinse, gargle, and spit after use.  . montelukast (SINGULAIR) 10 MG tablet Take one tablet each evening to prevent cough or wheeze.  . Multiple Vitamins-Minerals (MULTIVITAMIN WITH MINERALS) tablet Take 1 tablet by mouth daily with breakfast.   . Olopatadine HCl (PATANASE) 0.6 % SOLN Use 1-2 sprays in each nostril twice daily.  . pravastatin (PRAVACHOL) 20 MG tablet Take 1 tablet (20 mg total) by mouth daily.  [name] tiZANidine (ZANAFLEX) 4 MG tablet One half tablet before bed as needed for muscle pain/spasm.   No current facility-administered medications on file prior to visit.      Chief Complaint  Patient presents with  . Follow-up    Wears CPAP nighly. Denies any issue with mask/pressure. Pt states that machine is getting very hot to the touch when remains plugged up all day. pt states that she is having to unplug the machine d/t overheating when not in use. DME: [facility]    Sleep tests PSG 09/13/11 >> AHI 59  Pulmonary tests Spirometry 08/30/15 >> FEV1 1.82 (78%), FEV1% 81  Past medical history Asthma, Blind Lt eye, HLD, HTN, HA  Past surgical history, Family history, Social history, Allergies reviewed  Vital Signs BP (!) 142/92 (BP Location: Right Arm, Cuff Size: Normal)   Pulse 83   Ht 5\' 3"  (1.6 m)   Wt 218 lb 6.4 oz  (99.1 kg)   SpO2 96%   BMI 38.69 kg/m   History of Present Illness Rebecca [name] is a 60 y.o. female with obstructive sleep apnea.  She uses CPAP nightly.  She gets about 8 hrs sleep.  She has full face mask.  No issue with mask fit.  She feels rested.  Her machine gets very hot, and she needs to unplug device to allow it to cool off.  She was seen by Dr. Ishmael Holter for her allergies and asthma.  She has not established with a new allergist.  She is not having sinus congestion, cough, or wheeze.  Physical Exam  General - No distress ENT - No sinus tenderness, no oral exudate, no LAN, MP 4, enlarged tongue Cardiac - s1s2 regular, no murmur Chest - No wheeze/rales/dullness Back - No focal tenderness Abd - Soft, non-tender Ext - No edema Neuro - Normal strength Skin - No rashes Psych - normal mood, and behavior   Assessment/Plan  Obstructive sleep apnea. - she is compliant with CPAP and reports benefit from therapy - will arrange for new SD card and call her with results of download  Asthma. - she will call if she would like to have this managed by [facility]   Patient Instructions  Will arrange for new card for CPAP machine and call with results of download  Follow up in 1 year   Chesley Mires, MD [place]  Pulmonary/Critical Care/Sleep Pager:  [phone] 03/23/2016, 4:00 PM

## 2016-04-06 ENCOUNTER — Other Ambulatory Visit: Payer: Self-pay | Admitting: *Deleted

## 2016-04-20 ENCOUNTER — Ambulatory Visit: Payer: 59 | Admitting: Family Medicine

## 2016-05-03 NOTE — Progress Notes (Signed)
HPI:  Here for CPE:  -Concerns and/or follow up today:   PMH of HTN, HLD, Obesity, Hyperglycemia, Asthma, seasonal allergies, OSA. She see a pulmonologist for her asthma and sleep apnea. Reports doing ok. Feels dealing with mild depressed mood, fatigue, stress at work and with adopted son, anhedonia. No fevers, malaise, SI, panic, manic symptoms, unexplained wt loss, bowel changes, palpitations, melena, bleeding. Hip much better, still occ twinge of discomfort L buttock with certain movements. No regular exercise. Diet ok.Wt last visit 215lbs. She is due for diabetes screening and cholesterol check.  -Taking folic acid, vitamin D or calcium: no  -Diabetes and Dyslipidemia Screening: not fasting  -Hx of HTN: no  -Vaccines: UTD  -pap history: 09/2014 with her gynecologist, Dr. Matthew Saras  -FDLMP: n/a  -sexual activity: yes, female partner, no new partners  -wants STI testing (Hep C if born [birth year]): no  -FH breast, colon or ovarian ca: see FH Last mammogram: sees a gynecologist - no mammogram report in epic recently but per [facility] abstraction done 02/2016 Last colon cancer screening: no colonoscopy report in epic, but per [facility] abstraction done 08/2014 - per pt done with [facility]   -Alcohol, Tobacco, drug use: see social history  Review of Systems - no fevers, unintentional weight loss, vision loss, hearing loss, chest pain, sob, hemoptysis, melena, hematochezia, hematuria, genital discharge, changing or concerning skin lesions, bleeding, bruising, loc, thoughts of self harm or SI  Past Medical History:  Diagnosis Date  . Asthma   . Blindness, legal    L EYE  . Hearing loss   . Hyperlipidemia   . Hypertension   . Lapband May 2013 11/18/2011  . Migraines   . Obesity, Class III, BMI 40-49.9 (morbid obesity) ([place]) 06/03/2011  . Sleep apnea    uses c-pap    Past Surgical History:  Procedure Laterality Date  . ABDOMINAL HYSTERECTOMY  2006   fibroids  . BREATH TEK H PYLORI  08/17/2011   Procedure: BREATH TEK H PYLORI;  Surgeon: Pedro Earls, MD;  Location: [facility];  Service: General;  Laterality: N/A;  PATIENT WILL COME AT 0715  . EYE SURGERY     Patient unsure of surgery date. Left eye  . [place]   right  . LAPAROSCOPIC GASTRIC BANDING  10/20/2011   Procedure: LAPAROSCOPIC GASTRIC BANDING;  Surgeon: Pedro Earls, MD;  Location: [facility];  Service: General;  Laterality: N/A;    Family History  Problem Relation Age of Onset  . Cancer Mother     breast  . Breast cancer Mother   . Alcohol abuse Father   . Heart disease Father 30    MI  . Hypertension Father     Social History   Social History  . Marital status: Married    Spouse name: N/A  . Number of children: N/A  . Years of education: N/A   Social History Main Topics  . Smoking status: Former Smoker    Packs/day: 0.50    Years: 15.00    Types: Cigarettes    Quit date: 06/02/1994  . Smokeless tobacco: Never Used  . Alcohol use No  . Drug use: No  . Sexual activity: Not on file   Other Topics Concern  . 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  albuterol (VENTOLIN HFA) 108 (90 Base) MCG/ACT inhaler, Use 2 puffs every 4 hours as needed for cough or wheeze.  May use  2 puffs 10-20 minutes prior to exercise., Disp: 1 Inhaler, Rfl: 1 .  Calcium Citrate-Vitamin D (CALCIUM CITRATE + PO), Take 500 mg by mouth 3 (three) times daily., Disp: , Rfl:  .  Cholecalciferol (VITAMIN D-3 PO), Take by mouth daily., Disp: , Rfl:  .  fluticasone (FLONASE) 50 MCG/ACT nasal spray, Place 2 sprays into both nostrils daily.,  Disp: 16 g, Rfl: 1 .  lisinopril (PRINIVIL,ZESTRIL) 20 MG tablet, TAKE 1 TABLET (20 MG TOTAL) BY MOUTH DAILY., Disp: 90 tablet, Rfl: 0 .  mometasone-formoterol (DULERA) 200-5 MCG/ACT AERO, Use 2 puffs twice daily to prevent cough or wheeze.  Rinse, gargle, and spit after use., Disp: 1 Inhaler, Rfl: 5 .  montelukast (SINGULAIR) 10 MG tablet, Take one tablet each evening to prevent cough or wheeze., Disp: 90 tablet, Rfl: 0 .  Multiple Vitamins-Minerals (MULTIVITAMIN WITH MINERALS) tablet, Take 1 tablet by mouth daily with breakfast. , Disp: , Rfl:  .  Olopatadine HCl (PATANASE) 0.6 % SOLN, Use 1-2 sprays in each nostril twice daily., Disp: 1 Bottle, Rfl: 5 .  pravastatin (PRAVACHOL) 20 MG tablet, Take 1 tablet (20 mg total) by mouth daily., Disp: 30 tablet, Rfl: 3  EXAM:  Vitals:   05/04/16 0822  BP: 118/84  Pulse: 87  Temp: 98.1 F (36.7 C)  Body mass index is 39.89 kg/m.   GENERAL: vitals reviewed and listed below, alert, oriented, appears well hydrated and in no acute distress  HEENT: head atraumatic, PERRLA, normal appearance of eyes, ears, nose and mouth. moist mucus membranes.  NECK: supple, no masses or lymphadenopathy  LUNGS: clear to auscultation bilaterally, no rales, rhonchi or wheeze  CV: HRRR, no peripheral edema or cyanosis, normal pedal pulses  BREAST: declined, does with gyn  ABDOMEN: bowel sounds normal, soft, non tender to palpation, no masses, no rebound or guarding  GU: declined, does with gyn  SKIN: no rash or abnormal lesions  MS: normal gait, moves all extremities normally  NEURO: normal gait, speech and thought processing grossly intact, muscle tone grossly intact throughout  PSYCH: normal affect, pleasant and cooperative  ASSESSMENT AND PLAN:  Discussed the following assessment and plan:  Encounter for preventive health examination  BMI 39.0-39.9,adult  Hyperlipidemia, unspecified hyperlipidemia type - Plan: HDL cholesterol, Cholesterol,  total  Uncomplicated asthma, unspecified asthma severity, unspecified whether persistent  OSA (obstructive sleep apnea)  Essential hypertension, benign  Hyperglycemia - Plan: Hemoglobin A1c  Other fatigue - Plan: TSH, CBC (no diff), Vitamin B12  Mild single current episode of major depressive disorder ([place])  -counseling info provided, advised CBT, fun activities, exercise for mood; labs today to exclude other causes  -advised assistant to obtain colon and mammo reports  -Discussed and advised all [place] preventive services health task force level A and B recommendations for age, sex and risks.  -Advised at least 150 minutes of exercise per week and a healthy diet with avoidance of (less then 1 serving per week) processed foods, white starches, red meat, fast foods and sweets and consisting of: * 5-9 servings of fresh  fruits and vegetables (not corn or potatoes) *nuts and seeds, beans *olives and olive oil *lean meats such as fish and white chicken  *whole grains  -labs, studies and vaccines per orders this encounter  Orders Placed This Encounter  Procedures  . HDL cholesterol  . Cholesterol, total  . Hemoglobin A1c  . TSH  . CBC (no diff)  . Vitamin B12    Patient advised to return to clinic immediately if symptoms worsen or persist or new concerns.  Patient Instructions  BEFORE YOU LEAVE: -labs -obtain mammogram report from Dr. Matthew Saras and colonoscopy report from [facility] -follow up: 4 months  We have ordered labs or studies at this visit. It can take up to 1-2 weeks for results and processing. IF results require follow up or explanation, we will call you with instructions. Clinically stable results will be released to your [facility]. If you have not heard from [place] or cannot find your results in [facility] in 2 weeks please contact our office at [phone].   If you are not yet signed up for [facility], please SIGN UP TODAY. We now offer online scheduling, same day  appointments and extended hours. WHEN YOU DON'T FEEL YOUR BEST.Marland KitchenMarland KitchenWE ARE HERE TO HELP.   We recommend the following healthy lifestyle for LIFE: 1) Small portions.   Tip: eat off of a salad plate instead of a dinner plate.  Tip: It is ok to feel hungry after a meal - that likely means you ate an appropriate portion.  Tip: if you need more or a snack choose fruits, veggies and/or a handful of nuts or seeds.  2) Eat a healthy clean diet.  * Tip: Avoid (less then 1 serving per week): processed foods, sweets, sweetened drinks, white starches (rice, flour, bread, potatoes, pasta, etc), red meat, fast foods, butter  *Tip: CHOOSE instead   * 5-9 servings per day of fresh or frozen fruits and vegetables (but not corn, potatoes, bananas, canned or dried fruit)   *nuts and seeds, beans   *olives and olive oil   *small portions of lean meats such as fish and white chicken    *small portions of whole grains  3)Get at least 150 minutes of sweaty aerobic exercise per week.  4)Reduce stress - consider counseling, meditation and relaxation to balance other aspects of your life. e           No Follow-up on file.  Colin Benton R., DO

## 2016-05-04 ENCOUNTER — Ambulatory Visit (INDEPENDENT_AMBULATORY_CARE_PROVIDER_SITE_OTHER): Payer: 59 | Admitting: Family Medicine

## 2016-05-04 ENCOUNTER — Other Ambulatory Visit: Payer: Self-pay | Admitting: Family Medicine

## 2016-05-04 VITALS — BP 118/84 | HR 87 | Temp 98.1°F | Ht 62.0 in | Wt 218.1 lb

## 2016-05-04 DIAGNOSIS — J45909 Unspecified asthma, uncomplicated: Secondary | ICD-10-CM

## 2016-05-04 DIAGNOSIS — E785 Hyperlipidemia, unspecified: Secondary | ICD-10-CM | POA: Diagnosis not present

## 2016-05-04 DIAGNOSIS — R5383 Other fatigue: Secondary | ICD-10-CM

## 2016-05-04 DIAGNOSIS — Z0000 Encounter for general adult medical examination without abnormal findings: Secondary | ICD-10-CM

## 2016-05-04 DIAGNOSIS — R739 Hyperglycemia, unspecified: Secondary | ICD-10-CM

## 2016-05-04 DIAGNOSIS — Z6839 Body mass index (BMI) 39.0-39.9, adult: Secondary | ICD-10-CM | POA: Diagnosis not present

## 2016-05-04 DIAGNOSIS — I10 Essential (primary) hypertension: Secondary | ICD-10-CM

## 2016-05-04 DIAGNOSIS — G4733 Obstructive sleep apnea (adult) (pediatric): Secondary | ICD-10-CM

## 2016-05-04 DIAGNOSIS — F320 Major depressive disorder, single episode, mild: Secondary | ICD-10-CM

## 2016-05-04 LAB — HDL CHOLESTEROL: HDL: 48.5 mg/dL (ref >39.00)

## 2016-05-04 LAB — CBC
HEMATOCRIT: 39 % (ref 36.0–46.0)
Hemoglobin: 13.2 g/dL (ref 12.0–15.0)
MCHC: 33.8 g/dL (ref 30.0–36.0)
MCV: 90.8 fl (ref 78.0–100.0)
PLATELETS: 395 10*3/uL (ref 150.0–400.0)
RBC: 4.3 Mil/uL (ref 3.87–5.11)
RDW: 13.5 % (ref 11.5–15.5)
WBC: 7.4 10*3/uL (ref 4.0–10.5)

## 2016-05-04 LAB — TSH: TSH: 1.06 u[IU]/mL (ref 0.35–4.50)

## 2016-05-04 LAB — HEMOGLOBIN A1C: HEMOGLOBIN A1C: 6 % (ref 4.6–6.5)

## 2016-05-04 LAB — VITAMIN B12: VITAMIN B 12: 292 pg/mL (ref 211–911)

## 2016-05-04 LAB — CHOLESTEROL, TOTAL: Cholesterol: 197 mg/dL (ref 0–200)

## 2016-05-04 NOTE — Progress Notes (Signed)
Pre visit review using our clinic review tool, if applicable. No additional management support is needed unless otherwise documented below in the visit note. 

## 2016-05-04 NOTE — Patient Instructions (Addendum)
BEFORE YOU LEAVE: -labs -obtain mammogram report from Dr. Matthew Saras and colonoscopy report from [facility] -follow up: 4 months  We have ordered labs or studies at this visit. It can take up to 1-2 weeks for results and processing. IF results require follow up or explanation, we will call you with instructions. Clinically stable results will be released to your [facility]. If you have not heard from [place] or cannot find your results in [facility] in 2 weeks please contact our office at [phone].   If you are not yet signed up for [facility], please SIGN UP TODAY. We now offer online scheduling, same day appointments and extended hours. WHEN YOU DON'T FEEL YOUR BEST.Marland KitchenMarland KitchenWE ARE HERE TO HELP.   We recommend the following healthy lifestyle for LIFE: 1) Small portions.   Tip: eat off of a salad plate instead of a dinner plate.  Tip: It is ok to feel hungry after a meal - that likely means you ate an appropriate portion.  Tip: if you need more or a snack choose fruits, veggies and/or a handful of nuts or seeds.  2) Eat a healthy clean diet.  * Tip: Avoid (less then 1 serving per week): processed foods, sweets, sweetened drinks, white starches (rice, flour, bread, potatoes, pasta, etc), red meat, fast foods, butter  *Tip: CHOOSE instead   * 5-9 servings per day of fresh or frozen fruits and vegetables (but not corn, potatoes, bananas, canned or dried fruit)   *nuts and seeds, beans   *olives and olive oil   *small portions of lean meats such as fish and white chicken    *small portions of whole grains  3)Get at least 150 minutes of sweaty aerobic exercise per week.  4)Reduce stress - consider counseling, meditation and relaxation to balance other aspects of your life.

## 2016-05-18 ENCOUNTER — Encounter: Payer: Self-pay | Admitting: Family Medicine

## 2016-05-19 ENCOUNTER — Other Ambulatory Visit: Payer: Self-pay | Admitting: Family Medicine

## 2016-06-01 ENCOUNTER — Other Ambulatory Visit: Payer: Self-pay | Admitting: Family Medicine

## 2016-06-10 NOTE — Telephone Encounter (Signed)
Pharmacy called for pt to request a refill of tiZANidine (ZANAFLEX) 4 MG tablet  Sent on 12/18  [facility]/ [address]

## 2016-06-10 NOTE — Telephone Encounter (Signed)
Okay to refill? 

## 2016-06-11 NOTE — Telephone Encounter (Signed)
I called the pt and informed her of the message below and she stated she no longer needs a muscle relaxer.

## 2016-06-11 NOTE — Telephone Encounter (Signed)
Not usually a chronic medication. Advise follow up appt.

## 2016-08-17 ENCOUNTER — Encounter: Payer: Self-pay | Admitting: Pulmonary Disease

## 2016-08-17 DIAGNOSIS — G4733 Obstructive sleep apnea (adult) (pediatric): Secondary | ICD-10-CM | POA: Diagnosis not present

## 2016-08-29 ENCOUNTER — Other Ambulatory Visit: Payer: Self-pay | Admitting: Allergy and Immunology

## 2016-08-31 ENCOUNTER — Ambulatory Visit (INDEPENDENT_AMBULATORY_CARE_PROVIDER_SITE_OTHER): Payer: 59 | Admitting: Family Medicine

## 2016-08-31 ENCOUNTER — Encounter: Payer: Self-pay | Admitting: Family Medicine

## 2016-08-31 VITALS — BP 142/80 | HR 74 | Temp 98.2°F | Ht 62.0 in | Wt 211.0 lb

## 2016-08-31 DIAGNOSIS — R739 Hyperglycemia, unspecified: Secondary | ICD-10-CM | POA: Diagnosis not present

## 2016-08-31 DIAGNOSIS — IMO0001 Reserved for inherently not codable concepts without codable children: Secondary | ICD-10-CM

## 2016-08-31 DIAGNOSIS — G4733 Obstructive sleep apnea (adult) (pediatric): Secondary | ICD-10-CM | POA: Diagnosis not present

## 2016-08-31 DIAGNOSIS — Z6838 Body mass index (BMI) 38.0-38.9, adult: Secondary | ICD-10-CM

## 2016-08-31 DIAGNOSIS — E6609 Other obesity due to excess calories: Secondary | ICD-10-CM

## 2016-08-31 DIAGNOSIS — E785 Hyperlipidemia, unspecified: Secondary | ICD-10-CM | POA: Diagnosis not present

## 2016-08-31 DIAGNOSIS — J45909 Unspecified asthma, uncomplicated: Secondary | ICD-10-CM | POA: Diagnosis not present

## 2016-08-31 DIAGNOSIS — I10 Essential (primary) hypertension: Secondary | ICD-10-CM

## 2016-08-31 NOTE — Progress Notes (Signed)
HPI:  Follow up HTN, HLD, mild depression, obesity, hyperglycemia. Sees pulm for asthma. Wt 218 last last visit to 211 today. She reports she is drinking a lot more water, walking and eating healthier. Working on lower calorie diet. Feels great. Mood great. Denies CP, SOB, DOE, depression. See depression screen.  ROS: See pertinent positives and negatives per HPI.  Past Medical History:  Diagnosis Date  . Asthma   . Blindness, legal    L EYE  . Hearing loss   . Hyperlipidemia   . Hypertension   . Lapband May 2013 11/18/2011  . Migraines   . Obesity, Class III, BMI 40-49.9 (morbid obesity) ([place]) 06/03/2011  . Sleep apnea    uses c-pap    Past Surgical History:  Procedure Laterality Date  . ABDOMINAL HYSTERECTOMY  2006   fibroids  . BREATH TEK H PYLORI  08/17/2011   Procedure: BREATH TEK H PYLORI;  Surgeon: Pedro Earls, MD;  Location: [facility];  Service: General;  Laterality: N/A;  PATIENT WILL COME AT 0715  . EYE SURGERY     Patient unsure of surgery date. Left eye  . Marine   right  . LAPAROSCOPIC GASTRIC BANDING  10/20/2011   Procedure: LAPAROSCOPIC GASTRIC BANDING;  Surgeon: Pedro Earls, MD;  Location: [facility];  Service: General;  Laterality: N/A;    Family History  Problem Relation Age of Onset  . Cancer Mother     breast  . Breast cancer Mother   . Alcohol abuse Father   . Heart disease Father 6    MI  . Hypertension Father     Social History   Social History  . Marital status: Married    Spouse name: N/A  . Number of children: N/A  . Years of education: N/A   Social History Main Topics  . Smoking status: Former Smoker    Packs/day: 0.50    Years: 15.00    Types: Cigarettes    Quit date: 06/02/1994  . Smokeless tobacco: Never Used  . Alcohol use No  . Drug use: No  . Sexual activity: Not Asked   Other Topics Concern  . 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  albuterol (VENTOLIN HFA) 108 (90 Base) MCG/ACT inhaler, Use 2 puffs every 4 hours as needed for cough or wheeze.  May use  2 puffs 10-20 minutes prior to exercise., Disp: 1 Inhaler, Rfl: 1 .  Calcium Citrate-Vitamin D (CALCIUM CITRATE + PO), Take 500 mg by mouth 3 (three) times daily., Disp: , Rfl:  .  Cholecalciferol (VITAMIN D-3 PO), Take by mouth daily., Disp: , Rfl:  .  fluticasone (FLONASE) 50 MCG/ACT nasal spray, Place 2 sprays into both nostrils daily., Disp: 16 g, Rfl: 1 .  lisinopril (PRINIVIL,ZESTRIL) 20 MG tablet, TAKE 1 TABLET (20 MG TOTAL) BY MOUTH DAILY., Disp: 90 tablet, Rfl: 3 .  mometasone-formoterol (DULERA) 200-5 MCG/ACT AERO, Use 2 puffs twice daily to prevent cough or wheeze.  Rinse, gargle, and spit after use., Disp: 1 Inhaler, Rfl: 5 .  montelukast (SINGULAIR) 10 MG tablet, Take one tablet each evening to prevent cough or wheeze., Disp: 90 tablet, Rfl: 0 .  Multiple Vitamins-Minerals (MULTIVITAMIN WITH MINERALS) tablet, Take 1 tablet by mouth daily with breakfast. , Disp: , Rfl:  .  Olopatadine HCl (PATANASE) 0.6 % SOLN, Use 1-2 sprays in each nostril twice daily., Disp: 1 Bottle, Rfl: 5 .  pravastatin (PRAVACHOL) 20 MG tablet, Take 1 tablet (20 mg total) by mouth daily., Disp: 30 tablet, Rfl: 3 .  tiZANidine (ZANAFLEX) 4 MG tablet, ONE HALF TABLET BEFORE BED AS NEEDED FOR MUSCLE PAIN/SPASM., Disp: 14 tablet, Rfl: 0  EXAM:  Vitals:   08/31/16 0917  BP: (!) 142/84  Pulse: 82  Temp: 98.2 F (36.8 C)    Body mass index is 38.59 kg/m.  GENERAL: vitals reviewed and listed above, alert, oriented, appears well hydrated and in no acute distress  HEENT:  atraumatic, conjunttiva clear, no obvious abnormalities on inspection of external nose and ears  NECK: no obvious masses on inspection  LUNGS: clear to auscultation bilaterally, no wheezes, rales or rhonchi, good air movement  CV: HRRR, no peripheral edema  MS: moves all extremities without noticeable abnormality  PSYCH: pleasant and cooperative, no obvious depression or anxiety  ASSESSMENT AND PLAN:  Discussed the following assessment and plan:  Hyperlipidemia, unspecified hyperlipidemia type  Essential hypertension, benign  Uncomplicated asthma, unspecified asthma severity, unspecified whether persistent  OSA (obstructive sleep apnea)  Class 2 obesity due to excess calories with serious comorbidity and body mass index (BMI) of 38.0 to 38.9 in adult  -congratulated on lifestyle changes -mood issues resolved -planning to start lower calorie diet today and discussed healthy diet at length, restaurants that offer lower calorie meals, etc -will plan to recheck, BP on leaving after sitting, tx with lifestyle and follow up in 3 months with labs then -Patient advised to return or notify a doctor immediately if symptoms worsen or persist or new concerns arise.  Patient Instructions  BEFORE YOU LEAVE: -follow up: 3-4 months -Phq9 in computer -labs    Colin Benton R., DO

## 2016-08-31 NOTE — Progress Notes (Signed)
Pre visit review using our clinic review tool, if applicable. No additional management support is needed unless otherwise documented below in the visit note. 

## 2016-08-31 NOTE — Patient Instructions (Addendum)
BEFORE YOU LEAVE: -follow up: 3-4 months -recheck BP -Phq9 in computer   We recommend the following healthy lifestyle for LIFE: 1) Small portions.   Tip: eat off of a salad plate instead of a dinner plate.  Tip: if you need more or a snack choose fruits, veggies and/or a handful of nuts or seeds.  2) Eat a healthy clean diet.  * Tip: Avoid (less then 1 serving per week): processed foods, sweets, sweetened drinks, white starches (rice, flour, bread, potatoes, pasta, etc), red meat, fast foods, butter  *Tip: CHOOSE instead   * 5-9 servings per day of fresh or frozen fruits and vegetables (but not corn, potatoes, bananas, canned or dried fruit)   *nuts and seeds, beans   *olives and olive oil   *small portions of lean meats such as fish and white chicken    *small portions of whole grains  3)Get at least 150 minutes of sweaty aerobic exercise per week.  4)Reduce stress - consider counseling, meditation and relaxation to balance other aspects of your life.

## 2016-09-01 ENCOUNTER — Ambulatory Visit: Payer: 59 | Admitting: Family Medicine

## 2016-09-16 ENCOUNTER — Telehealth: Payer: Self-pay | Admitting: Pulmonary Disease

## 2016-09-16 DIAGNOSIS — G4733 Obstructive sleep apnea (adult) (pediatric): Secondary | ICD-10-CM

## 2016-09-16 NOTE — Telephone Encounter (Signed)
Dr. Halford Chessman  Please Advise-  Pt called in and states she heard from [facility] and they stated she is eligible for a new cpap machine. She wanted to know if you were ok with placing the order. She had them fax over a download which is scanned in her chart for you to look at.

## 2016-09-20 NOTE — Telephone Encounter (Signed)
CPAP 07/19/16 to 08/17/16 >> used on 30 of 30 nights with average 8 hrs 40 min.  Average AHI 1 with CPAP 13 cm H2O  Okay to send order for CPAP 13 cm H2O with heated humidity.

## 2016-09-21 NOTE — Telephone Encounter (Signed)
Order placed to [facility].  Pt aware and voiced her understanding. Nothing further needed.

## 2016-09-23 ENCOUNTER — Telehealth: Payer: Self-pay | Admitting: Pulmonary Disease

## 2016-09-23 NOTE — Telephone Encounter (Signed)
lmom tcb x1 pt will need an ov with VS or NP in order to obtain new cpap machine

## 2016-09-24 NOTE — Telephone Encounter (Signed)
lmomtcb x 2 for the pt---she will need OV to document cpap benefits and usage

## 2016-09-25 NOTE — Telephone Encounter (Signed)
Called and spoke to pt. Appt made with TP on 4.23.18. Pt verbalized understanding and denied any further questions or concerns at this time.

## 2016-10-05 ENCOUNTER — Encounter: Payer: Self-pay | Admitting: Adult Health

## 2016-10-05 ENCOUNTER — Ambulatory Visit (INDEPENDENT_AMBULATORY_CARE_PROVIDER_SITE_OTHER): Payer: 59 | Admitting: Adult Health

## 2016-10-05 DIAGNOSIS — G4733 Obstructive sleep apnea (adult) (pediatric): Secondary | ICD-10-CM | POA: Diagnosis not present

## 2016-10-05 NOTE — Patient Instructions (Signed)
Continue on C Pap at bedtime. Exon keep up the good work. Continue work on weight loss. Do not drive if sleepy .  Follow-up with Dr. Halford Chessman in 1 year and As needed

## 2016-10-05 NOTE — Assessment & Plan Note (Addendum)
Controlled on CPAP -great control /compliance w/ perceived benefit.  Order for new CPAP machine placed  Plan Patient Instructions  Continue on C Pap at bedtime. Exon keep up the good work. Continue work on weight loss. Do not drive if sleepy .  Follow-up with Dr. Halford Chessman in 1 year and As needed

## 2016-10-05 NOTE — Addendum Note (Signed)
Addended by: Parke Poisson E on: 10/05/2016 04:58 PM   Modules accepted: Orders

## 2016-10-05 NOTE — Progress Notes (Signed)
@Patient  ID: Rebecca [name], female    DOB: 1956/06/07, 61 y.o.   MRN: [id number]  No chief complaint on file.   Referring provider: Lucretia Kern, DO  HPI: 61 yo female followed for severe OSA on CPAP   Sleep tests PSG 09/13/11 >> AHI 59  10/05/2016 Follow up : OSA  Patient presents for a six-month follow-up for sleep apnea. Patient says she is doing well on her C Pap machine. She feels rested without significant daytime sleepiness. Download shows excellent compliance at 100% usage. She is using 9 hours each night. She is on a set pressure of 13cmH20 . AHI 1.1. Says she can not live without it.  Says her machine humidifier is not working properly. She requests new machine.      Allergies  Allergen Reactions  . Hydrocodone Hives, Shortness Of Breath and Itching  . Sulfa Antibiotics Hives    Immunization History  Administered Date(s) Administered  . Influenza,inj,Quad PF,36+ Mos 05/03/2015, 03/18/2016  . Influenza-Unspecified 03/22/2014  . Zoster 05/03/2015    Past Medical History:  Diagnosis Date  . Asthma   . Blindness, legal    L EYE  . Hearing loss   . Hyperlipidemia   . Hypertension   . Lapband May 2013 11/18/2011  . Migraines   . Obesity, Class III, BMI 40-49.9 (morbid obesity) ([place]) 06/03/2011  . Sleep apnea    uses c-pap    Tobacco History: History  Smoking Status  . Former Smoker  . Packs/day: 0.50  . Years: 15.00  . Types: Cigarettes  . Quit date: 06/02/1994  Smokeless Tobacco  . Never Used   Counseling given: Not Answered   Outpatient Encounter Prescriptions as of 10/05/2016  Medication Sig  . albuterol (VENTOLIN HFA) 108 (90 Base) MCG/ACT inhaler Use 2 puffs every 4 hours as needed for cough or wheeze.  May use  2 puffs 10-20 minutes prior to exercise.  . Calcium Citrate-Vitamin D (CALCIUM CITRATE + PO) Take 500 mg by mouth 3 (three) times daily.  . Cholecalciferol (VITAMIN D-3 PO) Take by mouth daily.  . fluticasone (FLONASE) 50 MCG/ACT  nasal spray Place 2 sprays into both nostrils daily.  [name] lisinopril (PRINIVIL,ZESTRIL) 20 MG tablet TAKE 1 TABLET (20 MG TOTAL) BY MOUTH DAILY.  . mometasone-formoterol (DULERA) 200-5 MCG/ACT AERO Use 2 puffs twice daily to prevent cough or wheeze.  Rinse, gargle, and spit after use.  . montelukast (SINGULAIR) 10 MG tablet Take one tablet each evening to prevent cough or wheeze.  . Multiple Vitamins-Minerals (MULTIVITAMIN WITH MINERALS) tablet Take 1 tablet by mouth daily with breakfast.   . Olopatadine HCl (PATANASE) 0.6 % SOLN Use 1-2 sprays in each nostril twice daily.  . pravastatin (PRAVACHOL) 20 MG tablet Take 1 tablet (20 mg total) by mouth daily.  [name] tiZANidine (ZANAFLEX) 4 MG tablet ONE HALF TABLET BEFORE BED AS NEEDED FOR MUSCLE PAIN/SPASM.   No facility-administered encounter medications on file as of 10/05/2016.      Review of Systems  Constitutional:   No  weight loss, night sweats,  Fevers, chills, fatigue, or  lassitude.  HEENT:   No headaches,  Difficulty swallowing,  Tooth/dental problems, or  Sore throat,                No sneezing, itching, ear ache, nasal congestion, post nasal drip,   CV:  No chest pain,  Orthopnea, PND, swelling in lower extremities, anasarca, dizziness, palpitations, syncope.   GI  No heartburn, indigestion, abdominal  pain, nausea, vomiting, diarrhea, change in bowel habits, loss of appetite, bloody stools.   Resp: No shortness of breath with exertion or at rest.  No excess mucus, no productive cough,  No non-productive cough,  No coughing up of blood.  No change in color of mucus.  No wheezing.  No chest wall deformity  Skin: no rash or lesions.  GU: no dysuria, change in color of urine, no urgency or frequency.  No flank pain, no hematuria   MS:  No joint pain or swelling.  No decreased range of motion.  No back pain.    Physical Exam  BP 122/84 (BP Location: Left Arm, Cuff Size: Large)   Pulse 78   Ht 5\' 3"  (1.6 m)   Wt 209 lb 3.2 oz  (94.9 kg)   SpO2 96%   BMI 37.06 kg/m   GEN: A/Ox3; pleasant , NAD, obese    HEENT:  [place]/AT,  EACs-clear, TMs-wnl, NOSE-clear, THROAT-clear, no lesions, no postnasal drip or exudate noted. Class 2-3 MP airway   NECK:  Supple w/ fair ROM; no JVD; normal carotid impulses w/o bruits; no thyromegaly or nodules palpated; no lymphadenopathy.    RESP  Clear  P & A; w/o, wheezes/ rales/ or rhonchi. no accessory muscle use, no dullness to percussion  CARD:  RRR, no m/r/g, no peripheral edema, pulses intact, no cyanosis or clubbing.  GI:   Soft & nt; nml bowel sounds; no organomegaly or masses detected.   Musco: Warm bil, no deformities or joint swelling noted.   Neuro: alert, no focal deficits noted.    Skin: Warm, no lesions or rashes    Lab Results:  CBC    Component Value Date/Time   WBC 7.4 05/04/2016 0903   RBC 4.30 05/04/2016 0903   HGB 13.2 05/04/2016 0903   HCT 39.0 05/04/2016 0903   PLT 395.0 05/04/2016 0903   MCV 90.8 05/04/2016 0903   MCH 30.6 10/21/2011 0530   MCHC 33.8 05/04/2016 0903   RDW 13.5 05/04/2016 0903   LYMPHSABS 1.4 10/21/2011 0530   MONOABS 0.4 10/21/2011 0530   EOSABS 0.0 10/21/2011 0530   BASOSABS 0.0 10/21/2011 0530    BMET    Component Value Date/Time   NA 143 05/03/2015 0909   K 4.0 05/03/2015 0909   CL 106 05/03/2015 0909   CO2 28 05/03/2015 0909   GLUCOSE 78 05/03/2015 0909   BUN 12 05/03/2015 0909   CREATININE 0.72 05/03/2015 0909   CREATININE 0.80 06/03/2011 1259   CALCIUM 9.5 05/03/2015 0909   GFRNONAA >90 10/20/2011 1843   GFRAA >90 10/20/2011 1843    BNP No results found for: BNP  ProBNP No results found for: PROBNP  Imaging: No results found.   Assessment & Plan:   No problem-specific Assessment & Plan notes found for this encounter.     Rexene Edison, NP 10/05/2016

## 2016-10-07 NOTE — Progress Notes (Signed)
I have reviewed and agree with assessment/plan.  Chesley Mires, MD [facility] Pulmonary/Critical Care 10/07/2016, 8:21 AM Pager:  [phone]

## 2016-10-20 DIAGNOSIS — G4733 Obstructive sleep apnea (adult) (pediatric): Secondary | ICD-10-CM | POA: Diagnosis not present

## 2016-11-04 DIAGNOSIS — N951 Menopausal and female climacteric states: Secondary | ICD-10-CM | POA: Diagnosis not present

## 2016-11-10 DIAGNOSIS — N958 Other specified menopausal and perimenopausal disorders: Secondary | ICD-10-CM | POA: Diagnosis not present

## 2016-11-10 DIAGNOSIS — I10 Essential (primary) hypertension: Secondary | ICD-10-CM | POA: Diagnosis not present

## 2016-11-10 DIAGNOSIS — R7303 Prediabetes: Secondary | ICD-10-CM | POA: Diagnosis not present

## 2016-11-10 DIAGNOSIS — E782 Mixed hyperlipidemia: Secondary | ICD-10-CM | POA: Diagnosis not present

## 2016-11-13 DIAGNOSIS — G4733 Obstructive sleep apnea (adult) (pediatric): Secondary | ICD-10-CM | POA: Diagnosis not present

## 2016-11-16 DIAGNOSIS — R7303 Prediabetes: Secondary | ICD-10-CM | POA: Diagnosis not present

## 2016-11-16 DIAGNOSIS — E782 Mixed hyperlipidemia: Secondary | ICD-10-CM | POA: Diagnosis not present

## 2016-11-19 ENCOUNTER — Ambulatory Visit (INDEPENDENT_AMBULATORY_CARE_PROVIDER_SITE_OTHER): Payer: 59 | Admitting: Allergy

## 2016-11-19 ENCOUNTER — Encounter: Payer: Self-pay | Admitting: Allergy

## 2016-11-19 VITALS — BP 118/80 | HR 84 | Resp 20

## 2016-11-19 DIAGNOSIS — J4540 Moderate persistent asthma, uncomplicated: Secondary | ICD-10-CM | POA: Diagnosis not present

## 2016-11-19 DIAGNOSIS — J310 Chronic rhinitis: Secondary | ICD-10-CM

## 2016-11-19 MED ORDER — ALBUTEROL SULFATE HFA 108 (90 BASE) MCG/ACT IN AERS: INHALATION_SPRAY | RESPIRATORY_TRACT | 1 refills | Status: DC

## 2016-11-19 MED ORDER — MOMETASONE FURO-FORMOTEROL FUM 200-5 MCG/ACT IN AERO: INHALATION_SPRAY | RESPIRATORY_TRACT | 5 refills | Status: DC

## 2016-11-19 MED ORDER — MONTELUKAST SODIUM 10 MG PO TABS: ORAL_TABLET | ORAL | 1 refills | Status: DC

## 2016-11-19 MED ORDER — OLOPATADINE HCL 0.6 % NA SOLN: NASAL | 5 refills | Status: DC

## 2016-11-19 NOTE — Patient Instructions (Signed)
  Asthma Continue Dulera 200/5 2 puffs twice a day Singulair 10mg  daily Albuterol 2 puffs every 4-6 hours as needed for cough, wheeze, shortness of breath, chest tightness.  Asthma control goals:   Full participation in all desired activities (may need albuterol before activity)  Albuterol use two time or less a week on average (not counting use with activity)  Cough interfering with sleep two time or less a month  Oral steroids no more than once a year  No hospitalizations  Mixed rhinitis Patanase 2 sprays each nostril up to twice daily for nasal drainage Flonase 2 sprays each nostril daily for nasal congestion. Zyrtec 10mg  daily as needed Saline nasal wash each evening in the shower and as needed.  Call with any recurring Ventolin use.  Follow-up in 4-6 months or sooner if needed.

## 2016-11-19 NOTE — Progress Notes (Signed)
Follow-up Note  RE: LEEAN AMEZCUA MRN: [id number] DOB: 02/10/1956 Date of Office Visit: 11/19/2016   History of present illness: Rebecca [name] is a 61 y.o. female presenting today for follow-up of asthma, mixed rhinitis. She also has a history of sleep apnea on CPAP followed by pulmonary. She was last seen in the office on 08/30/2015 by Dr. Ishmael Holter. She reports she was doing really well up until this past weekend when she decided to help her closet. She reports the likely dust mite exposure has triggered her asthma symptoms as well as allergy symptoms. She has been having chest tightness and cough as well as headache nasal congestion and itchy watery eyes. She reports today she is feeling better. She does take Singulair daily as well as Dulera one puff twice a day. She has needed to use her albuterol 3-4 times a day since this weekend but has not needed to use it at all today. Otherwise she denies any flares with her asthma or need for ED or urgent care visit or oral steroid needs since her last visit. She recalls using her albuterol once over the winter time.  She denies any nighttime awakenings. With her allergies she is taking Flonase as well as Zyrtec. She ran out of Patanase and that she buy over-the-counter Flonase to replace this. She uses her CPAP for sleep apnea and continues to follow with pulmonary.     Review of systems: Review of Systems  Constitutional: Negative for chills, fever and malaise/fatigue.  HENT: Positive for congestion. Negative for ear discharge, ear pain, nosebleeds, sinus pain, sore throat and tinnitus.   Eyes: Positive for redness. Negative for pain and discharge.  Respiratory: Positive for cough. Negative for shortness of breath and wheezing.   Cardiovascular: Negative for chest pain.  Gastrointestinal: Negative for abdominal pain, diarrhea, heartburn, nausea and vomiting.  Musculoskeletal: Negative for joint pain and myalgias.  Skin: Negative for itching  and rash.  Neurological: Negative for headaches.    All other systems negative unless noted above in HPI  Past medical/social/surgical/family history have been reviewed and are unchanged unless specifically indicated below.  No changes  Medication List: Allergies as of 11/19/2016      Reactions   Hydrocodone Hives, Shortness Of Breath, Itching   Sulfa Antibiotics Hives      Medication List       Accurate as of 11/19/16  7:17 PM. Always use your most recent med list.          albuterol 108 (90 Base) MCG/ACT inhaler Commonly known as:  VENTOLIN HFA Use 2 puffs every 4 hours as needed for cough or wheeze.  May use  2 puffs 10-20 minutes prior to exercise.   CALCIUM CITRATE + PO Take 500 mg by mouth 3 (three) times daily.   lisinopril 20 MG tablet Commonly known as:  PRINIVIL,ZESTRIL TAKE 1 TABLET (20 MG TOTAL) BY MOUTH DAILY.   mometasone-formoterol 200-5 MCG/ACT Aero Commonly known as:  DULERA Use 2 puffs twice daily to prevent cough or wheeze.  Rinse, gargle, and spit after use.   montelukast 10 MG tablet Commonly known as:  SINGULAIR Take one tablet each evening to prevent cough or wheeze.   multivitamin with minerals tablet Take 1 tablet by mouth daily with breakfast.   Olopatadine HCl 0.6 % Soln Commonly known as:  PATANASE Use 1-2 sprays in each nostril twice daily.   pravastatin 20 MG tablet Commonly known as:  PRAVACHOL Take 1 tablet (20 mg  total) by mouth daily.   tiZANidine 4 MG tablet Commonly known as:  ZANAFLEX ONE HALF TABLET BEFORE BED AS NEEDED FOR MUSCLE PAIN/SPASM.   VITAMIN D-3 PO Take by mouth daily.       Known medication allergies: Allergies  Allergen Reactions  . Hydrocodone Hives, Shortness Of Breath and Itching  . Sulfa Antibiotics Hives     Physical examination: Blood pressure 118/80, pulse 84, resp. rate 20.  General: Alert, interactive, in no acute distress. HEENT: PERRLA, TMs pearly gray, turbinates mildly edematous  with clear discharge, post-pharynx non erythematous. Neck: Supple without lymphadenopathy. Lungs: Clear to auscultation without wheezing, rhonchi or rales. {no increased work of breathing. CV: Normal S1, S2 without murmurs. Abdomen: Nondistended, nontender. Skin: Warm and dry, without lesions or rashes. Extremities:  No clubbing, cyanosis or edema. Neuro:   Grossly intact.  Diagnositics/Labs:  Spirometry: FEV1: 1.32L  71%, FVC: 1.49L  65%  This is decreased from previous study where she had an FEV1 of 78% and FVC of 84%  ACT score of 16  Assessment and plan:    Asthma, Moderate persistent Recently triggered by turning her closet most likely from dust mite exposure Continue Dulera 200/5 2 puffs twice a day and Singulair 10mg  daily Albuterol 2 puffs every 4-6 hours as needed for cough, wheeze, shortness of breath, chest tightness.  Asthma control goals:   Full participation in all desired activities (may need albuterol before activity)  Albuterol use two time or less a week on average (not counting use with activity)  Cough interfering with sleep two time or less a month  Oral steroids no more than once a year  No hospitalizations  Mixed rhinitis Patanase 2 sprays each nostril up to twice daily for nasal drainage Flonase 2 sprays each nostril daily for nasal congestion. Zyrtec 10mg  daily as needed Saline nasal wash each evening in the shower and as needed.  Call with any recurring Ventolin use.  Follow-up in 4-6 months or sooner if needed.    I appreciate the opportunity to take part in Jack's care. Please do not hesitate to contact me with questions.  Sincerely,   Prudy Feeler, MD Allergy/Immunology [facility]

## 2016-11-20 DIAGNOSIS — G4733 Obstructive sleep apnea (adult) (pediatric): Secondary | ICD-10-CM | POA: Diagnosis not present

## 2016-11-30 NOTE — Progress Notes (Signed)
HPI:  Rebecca [name] is a pleasant 61 y.o. here for follow up. Chronic medical problems summarized below were reviewed for changes and stability and were updated as needed.  Reports is doing well for the most part. Is walking 1 mile 4 days per week. Somewhat frustrated with her diet - diet review reveals too much sugar/carbs. Denies CP, SOB, DOE, treatment intolerance or new symptoms. Due for labs: cbc, bmp, hgba1c, CPE in November  HTN: -meds:lisinopril  HLD/Hyperglycemia/Obesity: -pravastatin  Mild depression: -resolved last visit  Asthma and Allergy: -meds: alb prn, dulera, singulair, patanase - sees asthma/allergy specialist  ROS: See pertinent positives and negatives per HPI.  Past Medical History:  Diagnosis Date  . Asthma   . Blindness, legal    L EYE  . Hearing loss   . Hyperlipidemia   . Hypertension   . Lapband May 2013 11/18/2011  . Migraines   . Obesity, Class III, BMI 40-49.9 (morbid obesity) ([place]) 06/03/2011  . Sleep apnea    uses c-pap    Past Surgical History:  Procedure Laterality Date  . ABDOMINAL HYSTERECTOMY  2006   fibroids  . BREATH TEK H PYLORI  08/17/2011   Procedure: BREATH TEK H PYLORI;  Surgeon: Pedro Earls, MD;  Location: [facility];  Service: General;  Laterality: N/A;  PATIENT WILL COME AT 0715  . EYE SURGERY     Patient unsure of surgery date. Left eye  . [place]   right  . LAPAROSCOPIC GASTRIC BANDING  10/20/2011   Procedure: LAPAROSCOPIC GASTRIC BANDING;  Surgeon: Pedro Earls, MD;  Location: [facility];  Service: General;  Laterality: N/A;    Family History  Problem Relation Age of Onset  . Cancer Mother        breast  . Breast cancer Mother   . Alcohol abuse Father   . Heart disease Father 22       MI  . Hypertension Father     Social History   Social History  . Marital status: Married    Spouse name: N/A  . Number of children: N/A  . Years of education: N/A   Social History Main Topics  . Smoking  status: Former Smoker    Packs/day: 0.50    Years: 15.00    Types: Cigarettes    Quit date: 06/02/1994  . Smokeless tobacco: Never Used  . Alcohol use No  . Drug use: No  . Sexual activity: Not Asked   Other Topics Concern  . 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  albuterol (VENTOLIN HFA) 108 (90 Base) MCG/ACT inhaler, Use 2 puffs every 4 hours as needed for cough or wheeze.  May use  2 puffs 10-20 minutes prior to exercise., Disp: 1 Inhaler, Rfl: 1 .  Calcium Citrate-Vitamin D (CALCIUM CITRATE + PO), Take 500 mg by mouth 3 (three) times daily., Disp: , Rfl:  .  Cholecalciferol (VITAMIN D-3 PO), Take by mouth daily., Disp: , Rfl:  .  lisinopril (PRINIVIL,ZESTRIL) 20 MG tablet, TAKE 1 TABLET (20 MG TOTAL) BY MOUTH DAILY., Disp: 90 tablet, Rfl: 3 .  mometasone-formoterol (DULERA) 200-5 MCG/ACT AERO, Use 2 puffs twice daily to prevent cough or wheeze.  Rinse, gargle, and spit after use., Disp: 1 Inhaler, Rfl: 5 .  montelukast (SINGULAIR) 10 MG tablet, Take one tablet each evening to prevent cough or wheeze., Disp: 90 tablet, Rfl: 1 .  Multiple Vitamins-Minerals (MULTIVITAMIN WITH MINERALS) tablet, Take 1 tablet by mouth daily with breakfast. , Disp: , Rfl:  .  Olopatadine HCl (PATANASE) 0.6 % SOLN, Use 1-2 sprays in each nostril twice daily., Disp: 1 Bottle, Rfl: 5 .  pravastatin (PRAVACHOL) 20 MG tablet, Take 1 tablet (20 mg total) by mouth daily., Disp: 30 tablet, Rfl: 3 .  tiZANidine (ZANAFLEX) 4 MG tablet, ONE HALF TABLET BEFORE BED AS NEEDED FOR MUSCLE PAIN/SPASM., Disp: 14 tablet, Rfl: 0  EXAM:  Vitals:    12/01/16 0912  BP: 118/84  Pulse: 72  Temp: 98.4 F (36.9 C)    Body mass index is 37.57 kg/m.  GENERAL: vitals reviewed and listed above, alert, oriented, appears well hydrated and in no acute distress  HEENT: atraumatic, conjunttiva clear, no obvious abnormalities on inspection of external nose and ears  NECK: no obvious masses on inspection  LUNGS: clear to auscultation bilaterally, no wheezes, rales or rhonchi, good air movement  CV: HRRR, no peripheral edema  MS: moves all extremities without noticeable abnormality  PSYCH: pleasant and cooperative, no obvious depression or anxiety  ASSESSMENT AND PLAN:  Discussed the following assessment and plan:  Essential hypertension, benign - Plan: CBC, Basic metabolic panel  Hyperlipidemia, unspecified hyperlipidemia type  Uncomplicated asthma, unspecified asthma severity, unspecified whether persistent  Class 2 severe obesity due to excess calories with serious comorbidity and body mass index (BMI) of 37.0 to 37.9 in adult (HCC)  Hyperglycemia - Plan: Hemoglobin A1c  Mild single current episode of major depressive disorder ([place]), Chronic  -labs -healthy low sugar diet and regular exercise advised -labs today -CPE in November -continue current medications -Patient advised to return or notify a doctor immediately if symptoms worsen or persist or new concerns arise.  Patient Instructions  BEFORE YOU LEAVE: -follow up: in November for physical exam  Cut out those carbs and sugars!  Advise regular aerobic exercise (at least 150 minutes per week of sweaty exercise) and a healthy diet. Try to eat at least 5-9 servings of vegetables and fruits per day (not corn, potatoes or bananas.) Avoid sweets, red meat, pork, butter, fried foods, fast food, processed food, excessive dairy, eggs and coconut. Replace bad fats with good fats - fish, nuts and seeds, canola oil, olive oil.   We have ordered labs or studies at this visit.  It can take up to 1-2 weeks for results and processing. IF results require follow up or explanation, we will call you with instructions. Clinically stable results will be released to your [facility]. If you have not heard from [place] or cannot find your results in [facility] in 2 weeks please contact our office at [phone].  If you are not yet signed up for [facility], please consider signing up.  WE NOW OFFER   [facility] FAST TRACK!!!  SAME DAY Appointments for ACUTE CARE  Such as: Sprains, Injuries, cuts, abrasions, rashes, muscle pain, joint pain, back pain Colds, flu, sore throats, headache, allergies, cough, fever  Ear pain, sinus and eye infections Abdominal pain, nausea, vomiting, diarrhea, upset stomach Animal/insect bites  3 Easy Ways to Schedule: Walk-In Scheduling Call in scheduling Mychart Sign-up: [email or web address]                Colin Benton R., DO

## 2016-12-01 ENCOUNTER — Ambulatory Visit (INDEPENDENT_AMBULATORY_CARE_PROVIDER_SITE_OTHER): Payer: 59 | Admitting: Family Medicine

## 2016-12-01 ENCOUNTER — Encounter: Payer: Self-pay | Admitting: Family Medicine

## 2016-12-01 VITALS — BP 118/84 | HR 72 | Temp 98.4°F | Wt 212.1 lb

## 2016-12-01 DIAGNOSIS — R739 Hyperglycemia, unspecified: Secondary | ICD-10-CM | POA: Diagnosis not present

## 2016-12-01 DIAGNOSIS — Z6837 Body mass index (BMI) 37.0-37.9, adult: Secondary | ICD-10-CM | POA: Diagnosis not present

## 2016-12-01 DIAGNOSIS — I10 Essential (primary) hypertension: Secondary | ICD-10-CM

## 2016-12-01 DIAGNOSIS — F320 Major depressive disorder, single episode, mild: Secondary | ICD-10-CM | POA: Diagnosis not present

## 2016-12-01 DIAGNOSIS — J45909 Unspecified asthma, uncomplicated: Secondary | ICD-10-CM

## 2016-12-01 DIAGNOSIS — E785 Hyperlipidemia, unspecified: Secondary | ICD-10-CM

## 2016-12-01 LAB — BASIC METABOLIC PANEL
BUN: 15 mg/dL (ref 6–23)
CALCIUM: 10 mg/dL (ref 8.4–10.5)
CO2: 30 meq/L (ref 19–32)
Chloride: 107 mEq/L (ref 96–112)
Creatinine, Ser: 0.69 mg/dL (ref 0.40–1.20)
GFR: 110.43 mL/min (ref >60.00)
GLUCOSE: 93 mg/dL (ref 70–99)
Potassium: 4.3 mEq/L (ref 3.5–5.1)
Sodium: 144 mEq/L (ref 135–145)

## 2016-12-01 LAB — CBC
HEMATOCRIT: 40.7 % (ref 36.0–46.0)
HEMOGLOBIN: 13.4 g/dL (ref 12.0–15.0)
MCHC: 33 g/dL (ref 30.0–36.0)
MCV: 92.9 fl (ref 78.0–100.0)
Platelets: 395 10*3/uL (ref 150.0–400.0)
RBC: 4.37 Mil/uL (ref 3.87–5.11)
RDW: 13.6 % (ref 11.5–15.5)
WBC: 7 10*3/uL (ref 4.0–10.5)

## 2016-12-01 LAB — HEMOGLOBIN A1C: Hgb A1c MFr Bld: 6 % (ref 4.6–6.5)

## 2016-12-01 NOTE — Patient Instructions (Addendum)
BEFORE YOU LEAVE: -follow up: in November for physical exam  Cut out those carbs and sugars!  Advise regular aerobic exercise (at least 150 minutes per week of sweaty exercise) and a healthy diet. Try to eat at least 5-9 servings of vegetables and fruits per day (not corn, potatoes or bananas.) Avoid sweets, red meat, pork, butter, fried foods, fast food, processed food, excessive dairy, eggs and coconut. Replace bad fats with good fats - fish, nuts and seeds, canola oil, olive oil.   We have ordered labs or studies at this visit. It can take up to 1-2 weeks for results and processing. IF results require follow up or explanation, we will call you with instructions. Clinically stable results will be released to your [facility]. If you have not heard from [place] or cannot find your results in [facility] in 2 weeks please contact our office at [phone].  If you are not yet signed up for [facility], please consider signing up.  WE NOW OFFER   [facility] FAST TRACK!!!  SAME DAY Appointments for ACUTE CARE  Such as: Sprains, Injuries, cuts, abrasions, rashes, muscle pain, joint pain, back pain Colds, flu, sore throats, headache, allergies, cough, fever  Ear pain, sinus and eye infections Abdominal pain, nausea, vomiting, diarrhea, upset stomach Animal/insect bites  3 Easy Ways to Schedule: Walk-In Scheduling Call in scheduling Mychart Sign-up: [email or web address]

## 2016-12-20 DIAGNOSIS — G4733 Obstructive sleep apnea (adult) (pediatric): Secondary | ICD-10-CM | POA: Diagnosis not present

## 2016-12-24 DIAGNOSIS — Z4651 Encounter for fitting and adjustment of gastric lap band: Secondary | ICD-10-CM | POA: Diagnosis not present

## 2016-12-24 DIAGNOSIS — R1310 Dysphagia, unspecified: Secondary | ICD-10-CM | POA: Diagnosis not present

## 2017-01-04 ENCOUNTER — Encounter: Payer: Self-pay | Admitting: Adult Health

## 2017-01-04 ENCOUNTER — Ambulatory Visit (INDEPENDENT_AMBULATORY_CARE_PROVIDER_SITE_OTHER): Payer: 59 | Admitting: Adult Health

## 2017-01-04 DIAGNOSIS — G4733 Obstructive sleep apnea (adult) (pediatric): Secondary | ICD-10-CM | POA: Diagnosis not present

## 2017-01-04 NOTE — Progress Notes (Signed)
Rebecca Mires, MD [facility] Pulmonary/Critical Care 01/04/2017, 7:14 PM Pager:  [phone] After 3pm call: [phone]

## 2017-01-04 NOTE — Progress Notes (Signed)
@Patient  ID: Rebecca [name], female    DOB: July 13, 1955, 61 y.o.   MRN: [id number]  No chief complaint on file.   Referring provider: Lucretia Kern, DO  HPI: 61 yo female followed for severe OSA on CPAP   Sleep tests PSG 09/13/11 >> AHI 59  01/04/2017 Follow up : OSA  Patient presents for a three-month follow-up. Patient has known severe sleep apnea. She is recently got a new C Pap machine. He says it is working very well. Patient feels rested without any significant daytime sleepiness. Download shows excellent compliance with 100% usage, average usage at 8.5 hours. Patient on a set pressure of 13 cm H2O. AHI 0.6. Minimum leaks.   Allergies  Allergen Reactions  . Hydrocodone Hives, Shortness Of Breath and Itching  . Sulfa Antibiotics Hives    Immunization History  Administered Date(s) Administered  . Influenza,inj,Quad PF,36+ Mos 05/03/2015, 03/18/2016  . Influenza-Unspecified 03/22/2014  . Zoster 05/03/2015    Past Medical History:  Diagnosis Date  . Asthma   . Blindness, legal    L EYE  . Hearing loss   . Hyperlipidemia   . Hypertension   . Lapband May 2013 11/18/2011  . Migraines   . Obesity, Class III, BMI 40-49.9 (morbid obesity) ([place]) 06/03/2011  . Sleep apnea    uses c-pap    Tobacco History: History  Smoking Status  . Former Smoker  . Packs/day: 0.50  . Years: 15.00  . Types: Cigarettes  . Quit date: 06/02/1994  Smokeless Tobacco  . Never Used   Counseling given: Not Answered   Outpatient Encounter Prescriptions as of 01/04/2017  Medication Sig  . albuterol (VENTOLIN HFA) 108 (90 Base) MCG/ACT inhaler Use 2 puffs every 4 hours as needed for cough or wheeze.  May use  2 puffs 10-20 minutes prior to exercise.  . Calcium Citrate-Vitamin D (CALCIUM CITRATE + PO) Take 500 mg by mouth 3 (three) times daily.  . Cholecalciferol (VITAMIN D-3 PO) Take by mouth daily.  [name] lisinopril (PRINIVIL,ZESTRIL) 20 MG tablet TAKE 1 TABLET (20 MG TOTAL) BY MOUTH DAILY.    . mometasone-formoterol (DULERA) 200-5 MCG/ACT AERO Use 2 puffs twice daily to prevent cough or wheeze.  Rinse, gargle, and spit after use.  . montelukast (SINGULAIR) 10 MG tablet Take one tablet each evening to prevent cough or wheeze.  . Multiple Vitamins-Minerals (MULTIVITAMIN WITH MINERALS) tablet Take 1 tablet by mouth daily with breakfast.   . Olopatadine HCl (PATANASE) 0.6 % SOLN Use 1-2 sprays in each nostril twice daily.  . pravastatin (PRAVACHOL) 20 MG tablet Take 1 tablet (20 mg total) by mouth daily.  [name] tiZANidine (ZANAFLEX) 4 MG tablet ONE HALF TABLET BEFORE BED AS NEEDED FOR MUSCLE PAIN/SPASM.   No facility-administered encounter medications on file as of 01/04/2017.      Review of Systems  Constitutional:   No  weight loss, night sweats,  Fevers, chills, fatigue, or  lassitude.  HEENT:   No headaches,  Difficulty swallowing,  Tooth/dental problems, or  Sore throat,                No sneezing, itching, ear ache, nasal congestion, post nasal drip,   CV:  No chest pain,  Orthopnea, PND, swelling in lower extremities, anasarca, dizziness, palpitations, syncope.   GI  No heartburn, indigestion, abdominal pain, nausea, vomiting, diarrhea, change in bowel habits, loss of appetite, bloody stools.   Resp: No shortness of breath with exertion or at rest.  No excess  mucus, no productive cough,  No non-productive cough,  No coughing up of blood.  No change in color of mucus.  No wheezing.  No chest wall deformity  Skin: no rash or lesions.  GU: no dysuria, change in color of urine, no urgency or frequency.  No flank pain, no hematuria   MS:  No joint pain or swelling.  No decreased range of motion.  No back pain.    Physical Exam  BP (!) 150/92 (BP Location: Right Arm, Patient Position: Sitting, Cuff Size: Large)   Pulse 90   Ht 5\' 3"  (1.6 m)   Wt 208 lb 12.8 oz (94.7 kg)   SpO2 94%   BMI 36.99 kg/m   GEN: A/Ox3; pleasant , NAD, obese    HEENT:  [place]/AT,  EACs-clear,  TMs-wnl, NOSE-clear, THROAT-clear, no lesions, no postnasal drip or exudate noted. Class 2- 3 MP airway   NECK:  Supple w/ fair ROM; no JVD; normal carotid impulses w/o bruits; no thyromegaly or nodules palpated; no lymphadenopathy.    RESP  Clear  P & A; w/o, wheezes/ rales/ or rhonchi. no accessory muscle use, no dullness to percussion  CARD:  RRR, no m/r/g, no peripheral edema, pulses intact, no cyanosis or clubbing.  GI:   Soft & nt; nml bowel sounds; no organomegaly or masses detected.   Musco: Warm bil, no deformities or joint swelling noted.   Neuro: alert, no focal deficits noted.    Skin: Warm, no lesions or rashes    Lab Results:  CBC    Component Value Date/Time   WBC 7.0 12/01/2016 0950   RBC 4.37 12/01/2016 0950   HGB 13.4 12/01/2016 0950   HCT 40.7 12/01/2016 0950   PLT 395.0 12/01/2016 0950   MCV 92.9 12/01/2016 0950   MCH 30.6 10/21/2011 0530   MCHC 33.0 12/01/2016 0950   RDW 13.6 12/01/2016 0950   LYMPHSABS 1.4 10/21/2011 0530   MONOABS 0.4 10/21/2011 0530   EOSABS 0.0 10/21/2011 0530   BASOSABS 0.0 10/21/2011 0530    BMET    Component Value Date/Time   NA 144 12/01/2016 0950   K 4.3 12/01/2016 0950   CL 107 12/01/2016 0950   CO2 30 12/01/2016 0950   GLUCOSE 93 12/01/2016 0950   BUN 15 12/01/2016 0950   CREATININE 0.69 12/01/2016 0950   CREATININE 0.80 06/03/2011 1259   CALCIUM 10.0 12/01/2016 0950   GFRNONAA >90 10/20/2011 1843   GFRAA >90 10/20/2011 1843    BNP No results found for: BNP  ProBNP No results found for: PROBNP  Imaging: No results found.   Assessment & Plan:   OSA (obstructive sleep apnea) Well controlled on CPAP   Plan  Patient Instructions  Continue on C Pap at bedtime.  Keep up the good work. Continue work on weight loss. Do not drive if sleepy .  Follow-up with Dr. Halford Chessman in 1 year and As needed         Rexene Edison, NP 01/04/2017

## 2017-01-04 NOTE — Patient Instructions (Signed)
Continue on C Pap at bedtime.  Keep up the good work. Continue work on weight loss. Do not drive if sleepy .  Follow-up with Dr. Halford Chessman in 1 year and As needed

## 2017-01-04 NOTE — Assessment & Plan Note (Signed)
Well controlled on CPAP   Plan  Patient Instructions  Continue on C Pap at bedtime.  Keep up the good work. Continue work on weight loss. Do not drive if sleepy .  Follow-up with Dr. Halford Chessman in 1 year and As needed

## 2017-01-12 ENCOUNTER — Ambulatory Visit: Payer: 59 | Admitting: Registered"

## 2017-01-13 DIAGNOSIS — Z1231 Encounter for screening mammogram for malignant neoplasm of breast: Secondary | ICD-10-CM | POA: Diagnosis not present

## 2017-01-18 DIAGNOSIS — Z01419 Encounter for gynecological examination (general) (routine) without abnormal findings: Secondary | ICD-10-CM | POA: Diagnosis not present

## 2017-01-20 DIAGNOSIS — G4733 Obstructive sleep apnea (adult) (pediatric): Secondary | ICD-10-CM | POA: Diagnosis not present

## 2017-01-28 ENCOUNTER — Telehealth: Payer: Self-pay | Admitting: Family Medicine

## 2017-01-28 NOTE — Telephone Encounter (Signed)
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 plese fax to Dorris Fetch [phone]. Placed form in dr kims folder at front office area.

## 2017-02-04 ENCOUNTER — Telehealth: Payer: Self-pay | Admitting: Family Medicine

## 2017-02-04 NOTE — Telephone Encounter (Signed)
Pt is calling back and does not need tetanus injection

## 2017-02-04 NOTE — Telephone Encounter (Signed)
Form completed and faxed to [place] at [phone] with note stating patient refused TB skin test as she was told this was not required.

## 2017-02-17 DIAGNOSIS — G4733 Obstructive sleep apnea (adult) (pediatric): Secondary | ICD-10-CM | POA: Diagnosis not present

## 2017-02-20 DIAGNOSIS — G4733 Obstructive sleep apnea (adult) (pediatric): Secondary | ICD-10-CM | POA: Diagnosis not present

## 2017-02-23 ENCOUNTER — Encounter: Payer: 59 | Attending: Surgery | Admitting: Registered"

## 2017-02-23 ENCOUNTER — Encounter: Payer: Self-pay | Admitting: Registered"

## 2017-02-23 DIAGNOSIS — E669 Obesity, unspecified: Secondary | ICD-10-CM

## 2017-02-23 DIAGNOSIS — Z713 Dietary counseling and surveillance: Secondary | ICD-10-CM | POA: Insufficient documentation

## 2017-02-23 DIAGNOSIS — Z9884 Bariatric surgery status: Secondary | ICD-10-CM | POA: Diagnosis not present

## 2017-02-23 NOTE — Patient Instructions (Addendum)
-   Add in non-starchy vegetables as snack options.   - Aim to keep fat/sugar grams in single digits per serving on nutrition facts label.   - Aim to have protein snack options as well such as greek yogurt and/or string cheese.  - Take 3 calcium supplements a day. Take at least 2 hrs away from each other.   - Take multivitamin and calcium supplements with meals and snacks.   - Aim to exercise while watching tv, 15-30 min of arm exercises.   - Reduce bread intake.

## 2017-02-23 NOTE — Progress Notes (Signed)
Follow-up visit:  6 Years Post-Operative LAGB Surgery  Medical Nutrition Therapy:  Appt start time: 10:12 end time:  10:48.  Primary concerns today: Post-operative Bariatric Surgery Nutrition Management.  Non scale victories: none stated  Surgery date: 10/20/2011 Surgery type: LAGB Start weight at [facility]: 236.2 lbs Weight today: 205.2 lbs Weight change: 12 lbs loss from 217.0 on 07/26/2013 Total weight lost: 31 lbs Weight loss goal: none stated   TANITA  BODY COMP RESULTS  02/23/2017   BMI (kg/m^2) 37.5   Fat Mass (lbs) 104.8   Fat Free Mass (lbs) 100.4   Total Body Water (lbs) 71.8   Pt states she wants to get back on track, get some weight off, and to feel better. Per referral, pt will return to surgeon in 4 months.   Pt states she recently had LAGB fill. Pt states she had LAGB 5-6 years ago, has lost about 34 lbs. Pt states she recently joined weight watchers 3 wks ago, has lost 5 lbs since starting. Pt states she has been lazy, but started walking last week. Pt is consuming less than 60 g protein daily.   Preferred Learning Style:   No preference indicated   Learning Readiness:   Ready  Change in progress  24-hr recall: B (AM): sometimes skips; banana, 2 slices of bacon (6g), 1 slice of whole wheat toast Snk (AM): fruit L (PM): grilled/baked chicken (21g), salad, vegetables Snk (PM): fruit  D (PM): grilled/baked chicken (21g), salad, vegetables Snk (PM): lite popcorn or weight watchers cookie or greek yogurt with walnuts  Fluid intake: water (64-84 oz), diet [place] sometimes, Lite [place], 1 cup of coffee daily; 64+ oz Estimated total protein intake: ~48g  Medications: See list Supplementation: Alive MVI  Last patient reported A1c: 6.0 on 12/01/2016  Using straws: sometimes, at restaurants Drinking while eating: no Having you been chewing well: yes Chewing/swallowing difficulties: no Changes in vision: no Changes to mood/headaches: no Hair  loss/[place] to skin/Changes to nails: no, no, no Any difficulty focusing or concentrating: no Sweating: no Dizziness/Lightheaded: no Palpitations: no  Carbonated beverages: sometimes N/V/D/C/GAS: no, no, no, no, no Abdominal Pain: no Dumping syndrome: no Last Lap-Band fill: 01/2017  Recent physical activity:  Walking 15 min, 3 days/week  Progress Towards Goal(s):  In progress.  Handouts given during visit include:  Arm exercises   Nutritional Diagnosis:  NI-5.7.1 Inadequate protein intake As related to bariatric surgery post-op recommendations.  As evidenced by pt report of less than 60 g of protein daily.    Intervention:  Nutrition education and counseling. Goals: - Add in non-starchy vegetables as snack options.  - Aim to keep fat/sugar grams in single digits per serving on nutrition facts label.  - Aim to have protein snack options as well such as greek yogurt and/or string cheese. - Take 3 calcium supplements a day. Take at least 2 hrs away from each other.  - Take multivitamin and calcium supplements with meals and snacks.  - Aim to exercise while watching tv, 15-30 min of arm exercises.  - Reduce bread intake.  Teaching Method Utilized:  Visual Auditory Hands on  Barriers to learning/adherence to lifestyle change: none  Demonstrated degree of understanding via:  Teach Back   Monitoring/Evaluation:  Dietary intake, exercise, lap band fills, and body weight. Follow up in 1 months for 6 year post-op visit.

## 2017-03-15 DIAGNOSIS — Z23 Encounter for immunization: Secondary | ICD-10-CM | POA: Diagnosis not present

## 2017-03-17 DIAGNOSIS — D179 Benign lipomatous neoplasm, unspecified: Secondary | ICD-10-CM | POA: Diagnosis not present

## 2017-03-22 DIAGNOSIS — G4733 Obstructive sleep apnea (adult) (pediatric): Secondary | ICD-10-CM | POA: Diagnosis not present

## 2017-03-24 ENCOUNTER — Ambulatory Visit: Payer: 59 | Admitting: Allergy

## 2017-03-29 ENCOUNTER — Ambulatory Visit: Payer: 59 | Admitting: Allergy

## 2017-03-30 ENCOUNTER — Encounter: Payer: Self-pay | Admitting: Registered"

## 2017-03-30 ENCOUNTER — Encounter: Payer: 59 | Attending: Surgery | Admitting: Registered"

## 2017-03-30 DIAGNOSIS — Z713 Dietary counseling and surveillance: Secondary | ICD-10-CM | POA: Insufficient documentation

## 2017-03-30 DIAGNOSIS — E669 Obesity, unspecified: Secondary | ICD-10-CM

## 2017-03-30 DIAGNOSIS — Z9884 Bariatric surgery status: Secondary | ICD-10-CM | POA: Diagnosis not present

## 2017-03-30 NOTE — Patient Instructions (Addendum)
-   Track food and fluid using [employer] app.   - Aim to increase protein intake to at least 60 grams per day.   - Aim to put on Fitbit, set it up to meet your step goals.   - Create schedule with meals/snacks to eat about 5 times per day.  - Add multivitamin and calcium supplement along with meal/snack.       Ex. Breakfast - multivitamin            Morning snack - multivitamin            Lunch - calcium             Afternoon snack - calcium            Dinner - calcium   - Get Bariatric-specific multivitamin and calcium supplements.

## 2017-03-30 NOTE — Progress Notes (Signed)
Follow-up visit:  6 Years Post-Operative LAGB Surgery  Medical Nutrition Therapy:  Appt start time: 10:40 end time:  11:20  Primary concerns today: Post-operative Bariatric Surgery Nutrition Management.  Non scale victories: knee pain has improved, feels smaller, feels good  Surgery date: 10/20/2011 Surgery type: LAGB Start weight at [facility]: 236.2 lbs Weight today: 197.4 lbs Weight change: 7.8 lbs loss from 205.2 lbs on 02/23/2017 Total weight lost: 38.8 lbs Weight loss goal: improve knee pain, improve health, to be around for grandchildren and great-grandchildren   TANITA  BODY COMP RESULTS  02/23/2017 03/30/2017   BMI (kg/m^2) 37.5 35.0   Fat Mass (lbs) 104.8 96.2   Fat Free Mass (lbs) 100.4 101.2   Total Body Water (lbs) 71.8 72.0   Pt states she wants to get back on track, get some weight off, and to feel better. Per referral, pt will return to surgeon in 4 months. Pt states she forgets to take MVI and calcium at times.   Pt states she has been doing well with drinking water because she is doing a water and walking challenge with friends. Pt states she is working to increase walking to  consistently 3 times per week. Pt states she is not hungry sometimes and will not eat. Pt states sometimes she has swallowing difficulties and vomits food when it does not go down; afterwards she does not want to eat to avoid vomiting. Pt states she forgets to take her multivitamins and calcium supplements during the day. Pt states its an "out of sight, out of mind" situation with her vitamins. 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  Pt is consuming less than 60 g protein daily.  Pt was given samples of bariatric multivitamins to try: Bariatric Advantage Advanced Multi Lot # [id number] Exp 12/2017  [facility] Multivitamin Lot # [id number] Exp 12/2018  Pt states she recently had LAGB fill. Pt states she recently joined weight  watchers 3 wks ago, has lost 5 lbs since starting. Pt states she has been lazy, but started walking last week.   Preferred Learning Style:   No preference indicated   Learning Readiness:   Ready  Change in progress  24-hr recall: B (AM): sometimes skips; 2 boiled eggs (12g),  2 oz country ham (14g), coffee Snk (AM): none L (PM): banana  Snk (PM): none D (PM):[employer]- chicken breast (22g), steamed vegetables, tsp of mashed potatoes Snk (PM): none  Fluid intake: water (64-84 oz), diet [place] sometimes, Lite Minute Maid, 1 cup of coffee daily; 64+ oz Estimated total protein intake: ~48g  Medications: See list Supplementation: sometimes Alive MVI  Last patient reported A1c: 6.0 on 12/01/2016  Using straws: sometimes at restaurants Drinking while eating: no Having you been chewing well: yes Chewing/swallowing difficulties: sometimes it gets stuck sometimes Changes in vision: no Changes to mood/headaches: no Hair loss/Changes to skin/Changes to nails: no, no, no Any difficulty focusing or concentrating: no Sweating: no Dizziness/Lightheaded: no Palpitations: no  Carbonated beverages: no N/V/D/C/GAS: no, no, no, no, no Abdominal Pain: no Dumping syndrome: no Last Lap-Band fill: 01/2017  Recent physical activity:  Walking and arm exercises 15 min, 3 days/week  Progress Towards Goal(s):  In progress.  Handouts given during visit include:  Vitamin and Mineral Recommendations   Nutritional Diagnosis:  NI-5.7.1 Inadequate protein intake As related to bariatric surgery post-op recommendations.  As evidenced by pt report of less than 60  g of protein daily.    Intervention:  Nutrition education and counseling. Goals: - Track food and fluid using Barit[employer] app.  - Aim to increase protein intake to at least 60 grams per day.  - Aim to put on Fitbit, set it up to meet your step goals.  - Create schedule with meals/snacks to eat about 5 times per day. - Add  multivitamin and calcium supplement along with meal/snack.       Ex. Breakfast - multivitamin            Morning snack - multivitamin            Lunch - calcium             Afternoon snack - calcium            Dinner - calcium  - Get Bariatric-specific multivitamin and calcium supplements.   Teaching Method Utilized:  Visual Auditory Hands on  Barriers to learning/adherence to lifestyle change: none  Demonstrated degree of understanding via:  Teach Back   Monitoring/Evaluation:  Dietary intake, exercise, lap band fills, and body weight. Follow up in 2 weeks for 6 year post-op visit.

## 2017-04-05 ENCOUNTER — Telehealth: Payer: Self-pay | Admitting: Family Medicine

## 2017-04-05 NOTE — Telephone Encounter (Signed)
[place] [facility] [employer] - does not usually need referral.

## 2017-04-05 NOTE — Telephone Encounter (Signed)
° ° ° °  Pt call to ask for a referral to see a Audiologist. She said she has had hearing problem for years

## 2017-04-13 ENCOUNTER — Encounter: Payer: 59 | Admitting: Registered"

## 2017-04-13 ENCOUNTER — Encounter: Payer: Self-pay | Admitting: Registered"

## 2017-04-13 DIAGNOSIS — E669 Obesity, unspecified: Secondary | ICD-10-CM

## 2017-04-13 DIAGNOSIS — Z713 Dietary counseling and surveillance: Secondary | ICD-10-CM | POA: Diagnosis not present

## 2017-04-13 NOTE — Patient Instructions (Addendum)
-   Take calcium supplements separately, 3 times a day.   - Create schedule with meals/snacks to eat about 5 times per day.  - Aim to increase protein intake to at least 60 grams per day.

## 2017-04-13 NOTE — Progress Notes (Signed)
Follow-up visit:  6 Years Post-Operative LAGB Surgery  Medical Nutrition Therapy:  Appt start time: 10:00 end time: 10:24  Primary concerns today: Post-operative Bariatric Surgery Nutrition Management.  Non scale victories: knee pain has improved, feels smaller, feels good  Surgery date: 10/20/2011 Surgery type: LAGB Start weight at [facility]: 236.2 lbs Weight today: Pt declined Weight change: N/A from 197.4 on 03/30/2017 Total weight lost: 38.8 lbs Weight loss goal: improve knee pain, improve health, to be around for grandchildren and great-grandchildren   TANITA  BODY COMP RESULTS  02/23/2017 03/30/2017 04/13/2017   BMI (kg/m^2) 37.5 35.0 Pt declined   Fat Mass (lbs) 104.8 96.2    Fat Free Mass (lbs) 100.4 101.2    Total Body Water (lbs) 71.8 72.0     Pt arrives called and informed us she would be late; arrives15 min late. Pt states she had unforeseen things to handle with her son at home prior to her appointment this morning. Pt states she has increased physical activity, walking 20 min. Pt states keeping track of time may be a better way for her to monitor physical activity rather than using step counter on activity tracker. Pt states she started tracking her food and fluid intake using Barit[employer] App but did not stick with it because she is busy. Pt states she order [employer] MVI capsules and has been taking 3 TUMS day (simultaneously). Pt states her days are busy and she has not had a chance to create a schedule of eating and taking supplements with meals.   Pt states she wants to get back on track, get some weight off, and to feel better. Per referral, pt will return to surgeon in 4 months. Pt states she forgets to take MVI and calcium at times.   Pt states she has been doing well with drinking water because she is doing a water and walking challenge with friends. Pt states she is working to increase walking to  consistently 3 times per week. Pt states she is not hungry sometimes and will  not eat. Pt states sometimes she has swallowing difficulties and vomits food when it does not go down; afterwards she does not want to eat to avoid vomiting. Pt states she forgets to take her multivitamins and calcium supplements during the day. Pt states its an "out of sight, out of mind" situation with her vitamins. 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  Pt is consuming less than 60 g protein daily.     Preferred Learning Style:   No preference indicated   Learning Readiness:   Ready  Change in progress  24-hr recall: B (AM): sometimes skips; 2 boiled eggs (12g),  2 oz country ham (14g), coffee Snk (AM): none L (PM): banana  Snk (PM): none D (PM):[employer]- chicken breast (22g), steamed vegetables, tsp of mashed potatoes Snk (PM): none  Fluid intake: water (64-84 oz), diet [place] sometimes, Lite Min[place] Maid, 1 cup of coffee daily; 64+ oz Estimated total protein intake: ~48g  Medications: See list Supplementation: sometimes Alive MVI  Last patient reported A1c: 6.0 on 12/01/2016  Using straws: sometimes at restaurants Drinking while eating: no Having you been chewing well: yes Chewing/swallowing difficulties: sometimes it gets stuck sometimes Changes in vision: no Changes to mood/headaches: no Hair loss/Changes to skin/Changes to nails: no, no, no Any difficulty focusing or concentrating: no Sweating: no Dizziness/Lightheaded: no Palpitations: no  Carbonated beverages: no  N/V/D/C/GAS: no, no, no, no, no Abdominal Pain: no Dumping syndrome: no Last Lap-Band fill: 01/2017  Recent physical activity:  Walking 20 min, 3-5 days/week  Progress Towards Goal(s):  In progress.  Handouts given during visit include:  none   Nutritional Diagnosis:  NI-5.7.1 Inadequate protein intake As related to bariatric surgery post-op recommendations.  As evidenced by pt report of less than 60 g of  protein daily.    Intervention:  Nutrition education and counseling. Goals: - Take calcium supplements separately, 3 times a day.  - Create schedule with meals/snacks to eat about 5 times per day. - Aim to increase protein intake to at least 60 grams per day.  Teaching Method Utilized:  Visual Auditory Hands on  Barriers to learning/adherence to lifestyle change: none  Demonstrated degree of understanding via:  Teach Back   Monitoring/Evaluation:  Dietary intake, exercise, lap band fills, and body weight. Follow up in 1 month for 6 year post-op visit.

## 2017-04-22 DIAGNOSIS — G4733 Obstructive sleep apnea (adult) (pediatric): Secondary | ICD-10-CM | POA: Diagnosis not present

## 2017-04-23 ENCOUNTER — Other Ambulatory Visit: Payer: Self-pay | Admitting: Surgery

## 2017-04-23 DIAGNOSIS — D179 Benign lipomatous neoplasm, unspecified: Secondary | ICD-10-CM

## 2017-04-29 ENCOUNTER — Ambulatory Visit
Admission: RE | Admit: 2017-04-29 | Discharge: 2017-04-29 | Disposition: A | Discharge status: Home / Self Care | Payer: 59 | Source: Ambulatory Visit | Attending: Surgery | Admitting: Surgery

## 2017-04-29 DIAGNOSIS — K5730 Diverticulosis of large intestine without perforation or abscess without bleeding: Secondary | ICD-10-CM | POA: Diagnosis not present

## 2017-04-29 DIAGNOSIS — D179 Benign lipomatous neoplasm, unspecified: Secondary | ICD-10-CM

## 2017-04-29 MED ORDER — IOPAMIDOL (ISOVUE-300) INJECTION 61%
100.0000 mL | Freq: Once | INTRAVENOUS | Status: AC | PRN
  Administered 2017-04-29: 100 mL via INTRAVENOUS

## 2017-05-10 ENCOUNTER — Encounter: Payer: 59 | Admitting: Family Medicine

## 2017-05-20 ENCOUNTER — Ambulatory Visit: Payer: 59 | Admitting: Registered"

## 2017-05-22 DIAGNOSIS — G4733 Obstructive sleep apnea (adult) (pediatric): Secondary | ICD-10-CM | POA: Diagnosis not present

## 2017-05-27 DIAGNOSIS — G4733 Obstructive sleep apnea (adult) (pediatric): Secondary | ICD-10-CM | POA: Diagnosis not present

## 2017-05-31 NOTE — Progress Notes (Signed)
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, reports remotely her "pupil was rotated" by an eye doctor for this reason -Reports has not seen an eye doctor in a long time -Otherwise, denies speech slurring, weakness, numbness, headache, vomiting, fevers, malaise, cognitive changes   HTN: -meds:lisinopril -Reports taking daily -Denies chest pain, shortness of breath, headaches  HLD/Hyperglycemia/Obesity: -pravastatin -seeing nutritionist, donetta floyd  -doing wt watchers, eating healthier and getting 7000 steps daily -wt 212 6/18 -->  193(12/18)  Asthma and Allergy: -meds: alb prn, dulera, singulair, patanase - sees asthma/allergy specialist  OSA: -sees pulmonopgy, uses cpap  -Diet: variety of foods, balance and well rounded, larger portion sizes -Exercise: no regular exercise -Taking folic acid, vitamin D or calcium: no -Diabetes and Dyslipidemia Screening: fasting for labs -Vaccines: see vaccine section EPIC -pap history: sees Dr. Matthew Saras in gyn, last pap 09/2014 -FDLMP: see nursing notes -sexual activity: yes, female partner, no new partners -wants STI testing (Hep C if born 60-65): no -FH breast, colon or ovarian ca: see FH Last mammogram: 10/2015 with gyn Last colon cancer screening: 08/2014 with [facility] per [facility] abstraction (reports Dr. Matthew Saras sent referral for last colonosocpy) - but pt reports she thinks it has been longer then that and she thinks she is due, she reports last colonoscopy normal Breast Ca Risk Assessment: see family history and pt history DEXA (>/= 65): n/a  -Alcohol, Tobacco, drug use: see social history  Review of Systems - no fevers,  unintentional weight loss, vision loss, hearing loss, chest pain, sob, hemoptysis, melena, hematochezia, hematuria, genital discharge, changing or concerning skin lesions, bleeding, bruising, loc, thoughts of self harm or SI  Past Medical History:  Diagnosis Date  . Asthma   . Blindness, legal    L EYE  . Hearing loss   . Hyperlipidemia   . Hypertension   . Lapband May 2013 11/18/2011  . Migraines   . Obesity, Class III, BMI 40-49.9 (morbid obesity) ([place]) 06/03/2011  . Sleep apnea    uses c-pap    Past Surgical History:  Procedure Laterality Date  . ABDOMINAL HYSTERECTOMY  2006   fibroids  . BREATH TEK H PYLORI  08/17/2011   Procedure: BREATH TEK H PYLORI;  Surgeon: Pedro Earls, MD;  Location: [facility];  Service: General;  Laterality: N/A;  PATIENT WILL COME AT 0715  . EYE SURGERY     Patient unsure of surgery date. Left eye  . [place]   right  . LAPAROSCOPIC GASTRIC BANDING  10/20/2011   Procedure: LAPAROSCOPIC GASTRIC BANDING;  Surgeon: Pedro Earls, MD;  Location: [facility];  Service: General;  Laterality: N/A;    Family History  Problem Relation Age of Onset  . Cancer Mother        breast  . Breast cancer Mother   . Alcohol abuse Father   . Heart disease Father 54       MI  . Hypertension Father     Social History   Socioeconomic History  . Marital status: Married    Spouse name: None  . Number of children: None  . Years of education: None  . Highest education  level: None  Social Needs  . Financial resource strain: None  . Food insecurity - worry: None  . Food insecurity - inability: None  . Transportation needs - medical: None  . Transportation needs - non-medical: None  Occupational History  . None  Tobacco Use  . Smoking status: Former Smoker    Packs/day: 0.50    Years: 15.00    Pack years: 7.50    Types: Cigarettes    Last attempt to quit: 06/02/1994    Years since quitting: 23.0  . Smokeless tobacco: Never Used  Substance and  Sexual Activity  . Alcohol use: No  . Drug use: No  . Sexual activity: None  Other Topics Concern  . 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  albuterol (VENTOLIN HFA) 108 (90 Base) MCG/ACT inhaler, Use 2 puffs every 4 hours as needed for cough or wheeze.  May use  2 puffs 10-20 minutes prior to exercise., Disp: 1 Inhaler, Rfl: 1 .  Calcium Citrate-Vitamin D (CALCIUM CITRATE + PO), Take 500 mg by mouth 3 (three) times daily., Disp: , Rfl:  .  Cholecalciferol (VITAMIN D-3 PO), Take by mouth daily., Disp: , Rfl:  .  lisinopril (PRINIVIL,ZESTRIL) 30 MG tablet, Take 1 tablet (30 mg total) by mouth daily., Disp: 90 tablet, Rfl: 3 .  mometasone-formoterol (DULERA) 200-5 MCG/ACT AERO, Use 2 puffs twice daily to prevent cough or wheeze.  Rinse, gargle, and spit after use., Disp: 1 Inhaler, Rfl: 5 .  montelukast (SINGULAIR) 10 MG tablet, Take one tablet each evening to prevent cough or wheeze., Disp: 90 tablet, Rfl: 1 .  Multiple Vitamins-Minerals (MULTIVITAMIN WITH MINERALS) tablet, Take 1 tablet by mouth daily with breakfast. , Disp: , Rfl:  .  Olopatadine HCl (PATANASE) 0.6 % SOLN, Use 1-2 sprays in each nostril twice daily., Disp: 1 Bottle, Rfl: 5 .  pravastatin (PRAVACHOL) 20 MG tablet, Take 1 tablet (20 mg total) by mouth daily., Disp: 30 tablet, Rfl: 3  EXAM:  Vitals:   06/01/17 0929  BP: 132/90  Pulse: 79  Temp: 98.4 F (36.9 C)  Body mass index is 35.34 kg/m.   GENERAL: vitals reviewed and listed below, alert, oriented, appears well hydrated and in no acute distress  HEENT: head atraumatic, PERRLA, EOMI,  normal appearance of eyes except for slightly cloudy appearance of the iris bilaterally, visual acuity seems to be grossly intact bilaterally -perhaps mildly reduced in the left eye on gross exam, ears, nose and mouth. moist mucus membranes.  NECK: supple, no masses or lymphadenopathy  LUNGS: clear to auscultation bilaterally, no rales, rhonchi or wheeze  CV: HRRR, no peripheral edema or cyanosis, normal pedal pulses  ABDOMEN: bowel sounds normal, soft, non tender to palpation, no masses, no rebound or guarding  GU/BREAST: Declined, does with GYN  SKIN: no rash or abnormal lesions  MS: normal gait, moves all extremities normally  NEURO: A&Ox3, normal gait, speech and thought processing grossly intact, muscle tone grossly intact throughout, cranial nerves II through XII  grossly intact, finger to nose is normal, no pronator drift  PSYCH: normal affect, pleasant and cooperative  ASSESSMENT AND PLAN:  Discussed the following assessment and plan: Note: A number of things outside of the normal physical exam were addressed today.  Patient is aware that this could result in additional charge.  1. PREVENTIVE EXAM: -Discussed and advised all [place] preventive services health task force level A and B recommendations for age, sex and risks. -Advised at least 150 minutes of exercise per week and a healthy diet with avoidance of (less then 1 serving per week) processed foods, white starches, red meat, fast foods and sweets and consisting of: * 5-9 servings of fresh fruits and vegetables (not corn or potatoes) *nuts and seeds, beans *olives and olive oil *lean meats such as fish and white chicken  *whole grains -labs, studies and vaccines per orders this encounter -I had my assistant contact [facility] to see when her last colonoscopy was done, it was last done in 2008 and she is due.  She has decided to do her next colonoscopy with low power and requests a referral.  Advised my assistant placed recent  referral.  2. Screening for depression -Negative  3. Visual disturbance - Ambulatory referral to Ophthalmology - MR Brain W Wo Contrast; Future -Emergency precautions  4. Lightheadedness -Labs per orders - MR Brain W Wo Contrast; Future -Emergency precautions  5. Essential hypertension, benign -Blood pressure little elevated today, will increase the lisinopril to 30 mg - Basic metabolic panel - CBC  6. Hyperlipidemia, unspecified hyperlipidemia type - Lipid panel  7. BMI 35.0-35.9,adult -Congratulated on changes in success, encouraged lifelong commitment to healthy diet and regular exercise - Hemoglobin A1c   Patient advised to return to clinic immediately if symptoms worsen or persist or new concerns.  Patient Instructions  BEFORE YOU LEAVE: -let debra know would like MRI and optho appt in next few days if possible -check on last colonoscopy and refer for colonosocpy if due -labs -follow up: 3 months  Sent referral for opthalmology evaluation and MRI for the vision issue.  If any other symptoms of feeling like you are going to pass out, weakness, numbness, worsening vision issues or other neurological issues, please seek emergency care immediately at the emergency room.  Call 911 if needed.  Make sure you contact your gastroenterologist again to double check on when your colonoscopy is due.  We have ordered labs or studies at this visit. It can take up to 1-2 weeks for results and processing. IF results require follow up or explanation, we will call you with instructions. Clinically stable results will be released to your [facility]. If you have not heard from [place] or cannot find your results in [facility] in 2 weeks please contact our office at [phone].  If you are not yet signed up for [facility], please consider signing up.        Health Maintenance for Postmenopausal Women Menopause is a normal process in which your reproductive ability comes to an end. This process  happens gradually over a span of months to years, usually between the ages of 13 and 69. Menopause is complete when you have missed 12 consecutive menstrual periods. It is important to talk with your health care provider about some of the most common conditions that affect postmenopausal women, such as heart disease, cancer, and bone loss (osteoporosis). Adopting a healthy lifestyle and getting preventive care can help to promote your health and wellness. Those actions can also lower  your chances of developing some of these common conditions. What should I know about menopause? During menopause, you may experience a number of symptoms, such as:  Moderate-to-severe hot flashes.  Night sweats.  Decrease in sex drive.  Mood swings.  Headaches.  Tiredness.  Irritability.  Memory problems.  Insomnia.  Choosing to treat or not to treat menopausal changes is an individual decision that you make with your health care provider. What should I know about hormone replacement therapy and supplements? Hormone therapy products are effective for treating symptoms that are associated with menopause, such as hot flashes and night sweats. Hormone replacement carries certain risks, especially as you become older. If you are thinking about using estrogen or estrogen with progestin treatments, discuss the benefits and risks with your health care provider. What should I know about heart disease and stroke? Heart disease, heart attack, and stroke become more likely as you age. This may be due, in part, to the hormonal changes that your body experiences during menopause. These can affect how your body processes dietary fats, triglycerides, and cholesterol. Heart attack and stroke are both medical emergencies. There are many things that you can do to help prevent heart disease and stroke:  Have your blood pressure checked at least every 1-2 years. High blood pressure causes heart disease and increases the risk  of stroke.  If you are 28-36 years old, ask your health care provider if you should take aspirin to prevent a heart attack or a stroke.  Do not use any tobacco products, including cigarettes, chewing tobacco, or electronic cigarettes. If you need help quitting, ask your health care provider.  It is important to eat a healthy diet and maintain a healthy weight. ? Be sure to include plenty of vegetables, fruits, low-fat dairy products, and lean protein. ? Avoid eating foods that are high in solid fats, added sugars, or salt (sodium).  Get regular exercise. This is one of the most important things that you can do for your health. ? Try to exercise for at least 150 minutes each week. The type of exercise that you do should increase your heart rate and make you sweat. This is known as moderate-intensity exercise. ? Try to do strengthening exercises at least twice each week. Do these in addition to the moderate-intensity exercise.  Know your numbers.Ask your health care provider to check your cholesterol and your blood glucose. Continue to have your blood tested as directed by your health care provider.  What should I know about cancer screening? There are several types of cancer. Take the following steps to reduce your risk and to catch any cancer development as early as possible. Breast Cancer  Practice breast self-awareness. ? This means understanding how your breasts normally appear and feel. ? It also means doing regular breast self-exams. Let your health care provider know about any changes, no matter how small.  If you are 20 or older, have a clinician do a breast exam (clinical breast exam or CBE) every year. Depending on your age, family history, and medical history, it may be recommended that you also have a yearly breast X-ray (mammogram).  If you have a family history of breast cancer, talk with your health care provider about genetic screening.  If you are at high risk for breast  cancer, talk with your health care provider about having an MRI and a mammogram every year.  Breast cancer (BRCA) gene test is recommended for women who have family members with BRCA-related cancers.  Results of the assessment will determine the need for genetic counseling and BRCA1 and for BRCA2 testing. BRCA-related cancers include these types: ? Breast. This occurs in males or females. ? Ovarian. ? Tubal. This may also be called fallopian tube cancer. ? Cancer of the abdominal or pelvic lining (peritoneal cancer). ? Prostate. ? Pancreatic.  Cervical, Uterine, and Ovarian Cancer Your health care provider may recommend that you be screened regularly for cancer of the pelvic organs. These include your ovaries, uterus, and vagina. This screening involves a pelvic exam, which includes checking for microscopic changes to the surface of your cervix (Pap test).  For women ages 21-65, health care providers may recommend a pelvic exam and a Pap test every three years. For women ages 30-65, they may recommend the Pap test and pelvic exam, combined with testing for human papilloma virus (HPV), every five years. Some types of HPV increase your risk of cervical cancer. Testing for HPV may also be done on women of any age who have unclear Pap test results.  Other health care providers may not recommend any screening for nonpregnant women who are considered low risk for pelvic cancer and have no symptoms. Ask your health care provider if a screening pelvic exam is right for you.  If you have had past treatment for cervical cancer or a condition that could lead to cancer, you need Pap tests and screening for cancer for at least 20 years after your treatment. If Pap tests have been discontinued for you, your risk factors (such as having a new sexual partner) need to be reassessed to determine if you should start having screenings again. Some women have medical problems that increase the chance of getting cervical  cancer. In these cases, your health care provider may recommend that you have screening and Pap tests more often.  If you have a family history of uterine cancer or ovarian cancer, talk with your health care provider about genetic screening.  If you have vaginal bleeding after reaching menopause, tell your health care provider.  There are currently no reliable tests available to screen for ovarian cancer.  Lung Cancer Lung cancer screening is recommended for adults 55-34 years old who are at high risk for lung cancer because of a history of smoking. A yearly low-dose CT scan of the lungs is recommended if you:  Currently smoke.  Have a history of at least 30 pack-years of smoking and you currently smoke or have quit within the past 15 years. A pack-year is smoking an average of one pack of cigarettes per day for one year.  Yearly screening should:  Continue until it has been 15 years since you quit.  Stop if you develop a health problem that would prevent you from having lung cancer treatment.  Colorectal Cancer  This type of cancer can be detected and can often be prevented.  Routine colorectal cancer screening usually begins at age 61 and continues through age 15.  If you have risk factors for colon cancer, your health care provider may recommend that you be screened at an earlier age.  If you have a family history of colorectal cancer, talk with your health care provider about genetic screening.  Your health care provider may also recommend using home test kits to check for hidden blood in your stool.  A small camera at the end of a tube can be used to examine your colon directly (sigmoidoscopy or colonoscopy). This is done to check for the earliest forms of  colorectal cancer.  Direct examination of the colon should be repeated every 5-10 years until age 89. However, if early forms of precancerous polyps or small growths are found or if you have a family history or genetic risk  for colorectal cancer, you may need to be screened more often.  Skin Cancer  Check your skin from head to toe regularly.  Monitor any moles. Be sure to tell your health care provider: ? About any new moles or changes in moles, especially if there is a change in a mole's shape or color. ? If you have a mole that is larger than the size of a pencil eraser.  If any of your family members has a history of skin cancer, especially at a young age, talk with your health care provider about genetic screening.  Always use sunscreen. Apply sunscreen liberally and repeatedly throughout the day.  Whenever you are outside, protect yourself by wearing long sleeves, pants, a wide-brimmed hat, and sunglasses.  What should I know about osteoporosis? Osteoporosis is a condition in which bone destruction happens more quickly than new bone creation. After menopause, you may be at an increased risk for osteoporosis. To help prevent osteoporosis or the bone fractures that can happen because of osteoporosis, the following is recommended:  If you are 1-12 years old, get at least 1,000 mg of calcium and at least 600 mg of vitamin D per day.  If you are older than age 87 but younger than age 72, get at least 1,200 mg of calcium and at least 600 mg of vitamin D per day.  If you are older than age 44, get at least 1,200 mg of calcium and at least 800 mg of vitamin D per day.  Smoking and excessive alcohol intake increase the risk of osteoporosis. Eat foods that are rich in calcium and vitamin D, and do weight-bearing exercises several times each week as directed by your health care provider. What should I know about how menopause affects my mental health? Depression may occur at any age, but it is more common as you become older. Common symptoms of depression include:  Low or sad mood.  Changes in sleep patterns.  Changes in appetite or eating patterns.  Feeling an overall lack of motivation or enjoyment of  activities that you previously enjoyed.  Frequent crying spells.  Talk with your health care provider if you think that you are experiencing depression. What should I know about immunizations? It is important that you get and maintain your immunizations. These include:  Tetanus, diphtheria, and pertussis (Tdap) booster vaccine.  Influenza every year before the flu season begins.  Pneumonia vaccine.  Shingles vaccine.  Your health care provider may also recommend other immunizations. This information is not intended to replace advice given to you by your health care provider. Make sure you discuss any questions you have with your health care provider. Document Released: 07/24/2005 Document Revised: 12/20/2015 Document Reviewed: 03/05/2015 Elsevier Interactive Patient Education  2018 [employer].       No Follow-up on file.  Colin Benton R., DO

## 2017-06-01 ENCOUNTER — Encounter: Payer: Self-pay | Admitting: Family Medicine

## 2017-06-01 ENCOUNTER — Ambulatory Visit (INDEPENDENT_AMBULATORY_CARE_PROVIDER_SITE_OTHER): Payer: 59 | Admitting: Family Medicine

## 2017-06-01 VITALS — BP 132/90 | HR 79 | Temp 98.4°F | Ht 62.0 in | Wt 193.2 lb

## 2017-06-01 DIAGNOSIS — H539 Unspecified visual disturbance: Secondary | ICD-10-CM

## 2017-06-01 DIAGNOSIS — E785 Hyperlipidemia, unspecified: Secondary | ICD-10-CM

## 2017-06-01 DIAGNOSIS — Z0001 Encounter for general adult medical examination with abnormal findings: Secondary | ICD-10-CM

## 2017-06-01 DIAGNOSIS — Z1331 Encounter for screening for depression: Secondary | ICD-10-CM | POA: Diagnosis not present

## 2017-06-01 DIAGNOSIS — R42 Dizziness and giddiness: Secondary | ICD-10-CM

## 2017-06-01 DIAGNOSIS — I10 Essential (primary) hypertension: Secondary | ICD-10-CM | POA: Diagnosis not present

## 2017-06-01 DIAGNOSIS — Z6835 Body mass index (BMI) 35.0-35.9, adult: Secondary | ICD-10-CM

## 2017-06-01 DIAGNOSIS — Z1211 Encounter for screening for malignant neoplasm of colon: Secondary | ICD-10-CM

## 2017-06-01 DIAGNOSIS — Z0000 Encounter for general adult medical examination without abnormal findings: Secondary | ICD-10-CM

## 2017-06-01 LAB — CBC
HCT: 39.8 % (ref 36.0–46.0)
Hemoglobin: 13.1 g/dL (ref 12.0–15.0)
MCHC: 33 g/dL (ref 30.0–36.0)
MCV: 92.8 fl (ref 78.0–100.0)
Platelets: 399 10*3/uL (ref 150.0–400.0)
RBC: 4.28 Mil/uL (ref 3.87–5.11)
RDW: 13.6 % (ref 11.5–15.5)
WBC: 6.5 10*3/uL (ref 4.0–10.5)

## 2017-06-01 LAB — BASIC METABOLIC PANEL
BUN: 14 mg/dL (ref 6–23)
CHLORIDE: 104 meq/L (ref 96–112)
CO2: 29 mEq/L (ref 19–32)
CREATININE: 0.76 mg/dL (ref 0.40–1.20)
Calcium: 9.4 mg/dL (ref 8.4–10.5)
GFR: 98.62 mL/min (ref >60.00)
Glucose, Bld: 83 mg/dL (ref 70–99)
Potassium: 4.3 mEq/L (ref 3.5–5.1)
Sodium: 141 mEq/L (ref 135–145)

## 2017-06-01 LAB — LIPID PANEL
CHOLESTEROL: 208 mg/dL — AB (ref 0–200)
HDL: 52.2 mg/dL (ref >39.00)
LDL Cholesterol: 130 mg/dL — ABNORMAL HIGH (ref 0–99)
NonHDL: 155.55
TRIGLYCERIDES: 126 mg/dL (ref 0.0–149.0)
Total CHOL/HDL Ratio: 4
VLDL: 25.2 mg/dL (ref 0.0–40.0)

## 2017-06-01 LAB — HEMOGLOBIN A1C: HEMOGLOBIN A1C: 5.9 % (ref 4.6–6.5)

## 2017-06-01 MED ORDER — LISINOPRIL 30 MG PO TABS: 30.0000 mg | ORAL_TABLET | Freq: Every day | ORAL | 3 refills | Status: DC

## 2017-06-01 NOTE — Patient Instructions (Signed)
BEFORE YOU LEAVE: -let debra know would like MRI and optho appt in next few days if possible -check on last colonoscopy and refer for colonosocpy if due -labs -follow up: 3 months  Sent referral for opthalmology evaluation and MRI for the vision issue.  If any other symptoms of feeling like you are going to pass out, weakness, numbness, worsening vision issues or other neurological issues, please seek emergency care immediately at the emergency room.  Call 911 if needed.  Make sure you contact your gastroenterologist again to double check on when your colonoscopy is due.  We have ordered labs or studies at this visit. It can take up to 1-2 weeks for results and processing. IF results require follow up or explanation, we will call you with instructions. Clinically stable results will be released to your [facility]. If you have not heard from [place] or cannot find your results in [facility] in 2 weeks please contact our office at [phone].  If you are not yet signed up for [facility], please consider signing up.        Health Maintenance for Postmenopausal Women Menopause is a normal process in which your reproductive ability comes to an end. This process happens gradually over a span of months to years, usually between the ages of 52 and 54. Menopause is complete when you have missed 12 consecutive menstrual periods. It is important to talk with your health care provider about some of the most common conditions that affect postmenopausal women, such as heart disease, cancer, and bone loss (osteoporosis). Adopting a healthy lifestyle and getting preventive care can help to promote your health and wellness. Those actions can also lower your chances of developing some of these common conditions. What should I know about menopause? During menopause, you may experience a number of symptoms, such as:  Moderate-to-severe hot flashes.  Night sweats.  Decrease in sex drive.  Mood  swings.  Headaches.  Tiredness.  Irritability.  Memory problems.  Insomnia.  Choosing to treat or not to treat menopausal changes is an individual decision that you make with your health care provider. What should I know about hormone replacement therapy and supplements? Hormone therapy products are effective for treating symptoms that are associated with menopause, such as hot flashes and night sweats. Hormone replacement carries certain risks, especially as you become older. If you are thinking about using estrogen or estrogen with progestin treatments, discuss the benefits and risks with your health care provider. What should I know about heart disease and stroke? Heart disease, heart attack, and stroke become more likely as you age. This may be due, in part, to the hormonal changes that your body experiences during menopause. These can affect how your body processes dietary fats, triglycerides, and cholesterol. Heart attack and stroke are both medical emergencies. There are many things that you can do to help prevent heart disease and stroke:  Have your blood pressure checked at least every 1-2 years. High blood pressure causes heart disease and increases the risk of stroke.  If you are 15-37 years old, ask your health care provider if you should take aspirin to prevent a heart attack or a stroke.  Do not use any tobacco products, including cigarettes, chewing tobacco, or electronic cigarettes. If you need help quitting, ask your health care provider.  It is important to eat a healthy diet and maintain a healthy weight. ? Be sure to include plenty of vegetables, fruits, low-fat dairy products, and lean protein. ? Avoid eating foods  that are high in solid fats, added sugars, or salt (sodium).  Get regular exercise. This is one of the most important things that you can do for your health. ? Try to exercise for at least 150 minutes each week. The type of exercise that you do should  increase your heart rate and make you sweat. This is known as moderate-intensity exercise. ? Try to do strengthening exercises at least twice each week. Do these in addition to the moderate-intensity exercise.  Know your numbers.Ask your health care provider to check your cholesterol and your blood glucose. Continue to have your blood tested as directed by your health care provider.  What should I know about cancer screening? There are several types of cancer. Take the following steps to reduce your risk and to catch any cancer development as early as possible. Breast Cancer  Practice breast self-awareness. ? This means understanding how your breasts normally appear and feel. ? It also means doing regular breast self-exams. Let your health care provider know about any changes, no matter how small.  If you are 60 or older, have a clinician do a breast exam (clinical breast exam or CBE) every year. Depending on your age, family history, and medical history, it may be recommended that you also have a yearly breast X-ray (mammogram).  If you have a family history of breast cancer, talk with your health care provider about genetic screening.  If you are at high risk for breast cancer, talk with your health care provider about having an MRI and a mammogram every year.  Breast cancer (BRCA) gene test is recommended for women who have family members with BRCA-related cancers. Results of the assessment will determine the need for genetic counseling and BRCA1 and for BRCA2 testing. BRCA-related cancers include these types: ? Breast. This occurs in males or females. ? Ovarian. ? Tubal. This may also be called fallopian tube cancer. ? Cancer of the abdominal or pelvic lining (peritoneal cancer). ? Prostate. ? Pancreatic.  Cervical, Uterine, and Ovarian Cancer Your health care provider may recommend that you be screened regularly for cancer of the pelvic organs. These include your ovaries, uterus,  and vagina. This screening involves a pelvic exam, which includes checking for microscopic changes to the surface of your cervix (Pap test).  For women ages 21-65, health care providers may recommend a pelvic exam and a Pap test every three years. For women ages 39-65, they may recommend the Pap test and pelvic exam, combined with testing for human papilloma virus (HPV), every five years. Some types of HPV increase your risk of cervical cancer. Testing for HPV may also be done on women of any age who have unclear Pap test results.  Other health care providers may not recommend any screening for nonpregnant women who are considered low risk for pelvic cancer and have no symptoms. Ask your health care provider if a screening pelvic exam is right for you.  If you have had past treatment for cervical cancer or a condition that could lead to cancer, you need Pap tests and screening for cancer for at least 20 years after your treatment. If Pap tests have been discontinued for you, your risk factors (such as having a new sexual partner) need to be reassessed to determine if you should start having screenings again. Some women have medical problems that increase the chance of getting cervical cancer. In these cases, your health care provider may recommend that you have screening and Pap tests more often.  If you have a family history of uterine cancer or ovarian cancer, talk with your health care provider about genetic screening.  If you have vaginal bleeding after reaching menopause, tell your health care provider.  There are currently no reliable tests available to screen for ovarian cancer.  Lung Cancer Lung cancer screening is recommended for adults 37-12 years old who are at high risk for lung cancer because of a history of smoking. A yearly low-dose CT scan of the lungs is recommended if you:  Currently smoke.  Have a history of at least 30 pack-years of smoking and you currently smoke or have quit  within the past 15 years. A pack-year is smoking an average of one pack of cigarettes per day for one year.  Yearly screening should:  Continue until it has been 15 years since you quit.  Stop if you develop a health problem that would prevent you from having lung cancer treatment.  Colorectal Cancer  This type of cancer can be detected and can often be prevented.  Routine colorectal cancer screening usually begins at age 78 and continues through age 36.  If you have risk factors for colon cancer, your health care provider may recommend that you be screened at an earlier age.  If you have a family history of colorectal cancer, talk with your health care provider about genetic screening.  Your health care provider may also recommend using home test kits to check for hidden blood in your stool.  A small camera at the end of a tube can be used to examine your colon directly (sigmoidoscopy or colonoscopy). This is done to check for the earliest forms of colorectal cancer.  Direct examination of the colon should be repeated every 5-10 years until age 66. However, if early forms of precancerous polyps or small growths are found or if you have a family history or genetic risk for colorectal cancer, you may need to be screened more often.  Skin Cancer  Check your skin from head to toe regularly.  Monitor any moles. Be sure to tell your health care provider: ? About any new moles or changes in moles, especially if there is a change in a mole's shape or color. ? If you have a mole that is larger than the size of a pencil eraser.  If any of your family members has a history of skin cancer, especially at a young age, talk with your health care provider about genetic screening.  Always use sunscreen. Apply sunscreen liberally and repeatedly throughout the day.  Whenever you are outside, protect yourself by wearing long sleeves, pants, a wide-brimmed hat, and sunglasses.  What should I know  about osteoporosis? Osteoporosis is a condition in which bone destruction happens more quickly than new bone creation. After menopause, you may be at an increased risk for osteoporosis. To help prevent osteoporosis or the bone fractures that can happen because of osteoporosis, the following is recommended:  If you are 96-17 years old, get at least 1,000 mg of calcium and at least 600 mg of vitamin D per day.  If you are older than age 69 but younger than age 78, get at least 1,200 mg of calcium and at least 600 mg of vitamin D per day.  If you are older than age 39, get at least 1,200 mg of calcium and at least 800 mg of vitamin D per day.  Smoking and excessive alcohol intake increase the risk of osteoporosis. Eat foods that are rich  in calcium and vitamin D, and do weight-bearing exercises several times each week as directed by your health care provider. What should I know about how menopause affects my mental health? Depression may occur at any age, but it is more common as you become older. Common symptoms of depression include:  Low or sad mood.  Changes in sleep patterns.  Changes in appetite or eating patterns.  Feeling an overall lack of motivation or enjoyment of activities that you previously enjoyed.  Frequent crying spells.  Talk with your health care provider if you think that you are experiencing depression. What should I know about immunizations? It is important that you get and maintain your immunizations. These include:  Tetanus, diphtheria, and pertussis (Tdap) booster vaccine.  Influenza every year before the flu season begins.  Pneumonia vaccine.  Shingles vaccine.  Your health care provider may also recommend other immunizations. This information is not intended to replace advice given to you by your health care provider. Make sure you discuss any questions you have with your health care provider. Document Released: 07/24/2005 Document Revised: 12/20/2015  Document Reviewed: 03/05/2015 Elsevier Interactive Patient Education  2018 [employer].

## 2017-06-02 ENCOUNTER — Encounter: Payer: Self-pay | Admitting: Gastroenterology

## 2017-06-03 MED ORDER — PRAVASTATIN SODIUM 40 MG PO TABS: 40.0000 mg | ORAL_TABLET | Freq: Every day | ORAL | 1 refills | Status: DC

## 2017-06-03 NOTE — Addendum Note (Signed)
Addended by: Agnes Lawrence on: 06/03/2017 12:48 PM   Modules accepted: Orders

## 2017-06-22 DIAGNOSIS — G4733 Obstructive sleep apnea (adult) (pediatric): Secondary | ICD-10-CM | POA: Diagnosis not present

## 2017-06-23 DIAGNOSIS — R1310 Dysphagia, unspecified: Secondary | ICD-10-CM | POA: Diagnosis not present

## 2017-07-23 ENCOUNTER — Ambulatory Visit (AMBULATORY_SURGERY_CENTER): Payer: Self-pay | Admitting: *Deleted

## 2017-07-23 ENCOUNTER — Encounter: Payer: Self-pay | Admitting: Gastroenterology

## 2017-07-23 ENCOUNTER — Other Ambulatory Visit: Payer: Self-pay

## 2017-07-23 VITALS — Ht 63.0 in | Wt 194.0 lb

## 2017-07-23 DIAGNOSIS — Z1211 Encounter for screening for malignant neoplasm of colon: Secondary | ICD-10-CM

## 2017-07-23 DIAGNOSIS — G4733 Obstructive sleep apnea (adult) (pediatric): Secondary | ICD-10-CM | POA: Diagnosis not present

## 2017-07-23 MED ORDER — NA SULFATE-K SULFATE-MG SULF 17.5-3.13-1.6 GM/177ML PO SOLN: ORAL | 0 refills | Status: DC

## 2017-07-23 NOTE — Progress Notes (Signed)
Patient denies any allergies to eggs or soy. Patient denies any problems with anesthesia/sedation. Patient denies any oxygen use at home. Patient denies taking any diet/weight loss medications or blood thinners. EMMI education assisgned to patient on colonoscopy, this was explained and instructions given to patient. Suprep coupon printed and given to pt. Patient is aware the cost of Suprep and agrees to pay for this medication.

## 2017-08-06 ENCOUNTER — Encounter: Payer: Self-pay | Admitting: Gastroenterology

## 2017-08-06 ENCOUNTER — Ambulatory Visit (AMBULATORY_SURGERY_CENTER): Payer: 59 | Admitting: Gastroenterology

## 2017-08-06 ENCOUNTER — Other Ambulatory Visit: Payer: Self-pay

## 2017-08-06 VITALS — BP 118/71 | HR 76 | Temp 97.7°F | Resp 16 | Ht 63.0 in | Wt 194.0 lb

## 2017-08-06 DIAGNOSIS — D125 Benign neoplasm of sigmoid colon: Secondary | ICD-10-CM

## 2017-08-06 DIAGNOSIS — K635 Polyp of colon: Secondary | ICD-10-CM | POA: Diagnosis not present

## 2017-08-06 DIAGNOSIS — Z1211 Encounter for screening for malignant neoplasm of colon: Secondary | ICD-10-CM | POA: Diagnosis present

## 2017-08-06 MED ORDER — SODIUM CHLORIDE 0.9 % IV SOLN: 500.0000 mL | Freq: Once | INTRAVENOUS | Status: DC

## 2017-08-06 NOTE — Progress Notes (Signed)
To PACU, VSS. Report to [email or web address] 

## 2017-08-06 NOTE — Progress Notes (Signed)
Pt's states no medical or surgical changes since previsit or office visit. 

## 2017-08-06 NOTE — Progress Notes (Signed)
Called to room to assist during endoscopic procedure.  Patient ID and intended procedure confirmed with present staff. Received instructions for my participation in the procedure from the performing physician.  

## 2017-08-06 NOTE — Op Note (Signed)
[place] Patient Name: Rebecca [name] Procedure Date: 08/06/2017 9:49 AM MRN: [id number] Endoscopist: Mauri Pole , MD Age: 62 Referring MD:  Date of Birth: 05-29-1956 Gender: Female Account #: [id number] Procedure:                Colonoscopy Indications:              Screening for colorectal malignant neoplasm, Last                            colonoscopy 10 years ago Medicines:                Monitored Anesthesia Care Procedure:                Pre-Anesthesia Assessment:                           - Prior to the procedure, a History and Physical                            was performed, and patient medications and                            allergies were reviewed. The patient's tolerance of                            previous anesthesia was also reviewed. The risks                            and benefits of the procedure and the sedation                            options and risks were discussed with the patient.                            All questions were answered, and informed consent                            was obtained. Prior Anticoagulants: The patient has                            taken no previous anticoagulant or antiplatelet                            agents. ASA Grade Assessment: II - A patient with                            mild systemic disease. After reviewing the risks                            and benefits, the patient was deemed in                            satisfactory condition to undergo the procedure.  After obtaining informed consent, the colonoscope                            was passed under direct vision. Throughout the                            procedure, the patient's blood pressure, pulse, and                            oxygen saturations were monitored continuously. The                            Colonoscope was introduced through the anus and                            advanced to the the cecum,  identified by                            appendiceal orifice and ileocecal valve. The                            colonoscopy was performed without difficulty. The                            patient tolerated the procedure well. The quality                            of the bowel preparation was adequate. The                            ileocecal valve, appendiceal orifice, and rectum                            were photographed. Scope In: 10:01:38 AM Scope Out: 10:22:23 AM Scope Withdrawal Time: 0 hours 13 minutes 59 seconds  Total Procedure Duration: 0 hours 20 minutes 45 seconds  Findings:                 The perianal and digital rectal examinations were                            normal.                           Two hyperplastic polyps were found in the sigmoid                            colon. The polyps were 1 to 2 mm in size. These                            polyps were removed with a cold biopsy forceps.                            Resection and retrieval were complete.  Scattered small-mouthed diverticula were found in                            the sigmoid colon, descending colon, transverse                            colon, ascending colon and cecum.                           Non-bleeding internal hemorrhoids were found during                            retroflexion. The hemorrhoids were small. Complications:            No immediate complications. Estimated Blood Loss:     Estimated blood loss was minimal. Impression:               - Two 1 to 2 mm polyps in the sigmoid colon,                            removed with a cold biopsy forceps. Resected and                            retrieved.                           - Diverticulosis in the sigmoid colon, in the                            descending colon, in the transverse colon, in the                            ascending colon and in the cecum.                           - Non-bleeding internal  hemorrhoids. Recommendation:           - Patient has a contact number available for                            emergencies. The signs and symptoms of potential                            delayed complications were discussed with the                            patient. Return to normal activities tomorrow.                            Written discharge instructions were provided to the                            patient.                           - Resume previous diet.                           -  Continue present medications.                           - Await pathology results.                           - Repeat colonoscopy in 5-10 years for surveillance                            based on pathology results. Mauri Pole, MD 08/06/2017 10:27:48 AM This report has been signed electronically.

## 2017-08-06 NOTE — Patient Instructions (Signed)
Handouts given for polyps, hemorrhoids and diverticulosis  YOU HAD AN ENDOSCOPIC PROCEDURE TODAY AT [place]:   Refer to the procedure report that was given to you for any specific questions about what was found during the examination.  If the procedure report does not answer your questions, please call your gastroenterologist to clarify.  If you requested that your care partner not be given the details of your procedure findings, then the procedure report has been included in a sealed envelope for you to review at your convenience later.  YOU SHOULD EXPECT: Some feelings of bloating in the abdomen. Passage of more gas than usual.  Walking can help get rid of the air that was put into your GI tract during the procedure and reduce the bloating. If you had a lower endoscopy (such as a colonoscopy or flexible sigmoidoscopy) you may notice spotting of blood in your stool or on the toilet paper. If you underwent a bowel prep for your procedure, you may not have a normal bowel movement for a few days.  Please Note:  You might notice some irritation and congestion in your nose or some drainage.  This is from the oxygen used during your procedure.  There is no need for concern and it should clear up in a day or so.  SYMPTOMS TO REPORT IMMEDIATELY:   Following lower endoscopy (colonoscopy or flexible sigmoidoscopy):  Excessive amounts of blood in the stool  Significant tenderness or worsening of abdominal pains  Swelling of the abdomen that is new, acute  Fever of 100F or higher  For urgent or emergent issues, a gastroenterologist can be reached at any hour by calling [phone].   DIET:  We do recommend a small meal at first, but then you may proceed to your regular diet.  Drink plenty of fluids but you should avoid alcoholic beverages for 24 hours.  ACTIVITY:  You should plan to take it easy for the rest of today and you should NOT DRIVE or use heavy machinery until tomorrow  (because of the sedation medicines used during the test).    FOLLOW UP: Our staff will call the number listed on your records the next business day following your procedure to check on you and address any questions or concerns that you may have regarding the information given to you following your procedure. If we do not reach you, we will leave a message.  However, if you are feeling well and you are not experiencing any problems, there is no need to return our call.  We will assume that you have returned to your regular daily activities without incident.  If any biopsies were taken you will be contacted by phone or by letter within the next 1-3 weeks.  Please call us at [phone] if you have not heard about the biopsies in 3 weeks.    SIGNATURES/CONFIDENTIALITY: You and/or your care partner have signed paperwork which will be entered into your electronic medical record.  These signatures attest to the fact that that the information above on your After Visit Summary has been reviewed and is understood.  Full responsibility of the confidentiality of this discharge information lies with you and/or your care-partner.

## 2017-08-09 ENCOUNTER — Telehealth: Payer: Self-pay | Admitting: *Deleted

## 2017-08-09 DIAGNOSIS — H5310 Unspecified subjective visual disturbances: Secondary | ICD-10-CM | POA: Diagnosis not present

## 2017-08-09 DIAGNOSIS — H2513 Age-related nuclear cataract, bilateral: Secondary | ICD-10-CM | POA: Diagnosis not present

## 2017-08-09 DIAGNOSIS — H1789 Other corneal scars and opacities: Secondary | ICD-10-CM | POA: Diagnosis not present

## 2017-08-09 NOTE — Telephone Encounter (Signed)
  Follow up Call-  Call back number 08/06/2017  Post procedure Call Back phone  # [phone]  Permission to leave phone message Yes  Some recent data might be hidden     No answer at # given.  LM on VM.

## 2017-08-09 NOTE — Telephone Encounter (Signed)
  Follow up Call-  Call back number 08/06/2017  Post procedure Call Back phone  # [phone]  Permission to leave phone message Yes  Some recent data might be hidden     Patient questions:  Do you have a fever, pain , or abdominal swelling? No. Pain Score  0 *  Have you tolerated food without any problems? Yes.    Have you been able to return to your normal activities? Yes.    Do you have any questions about your discharge instructions: Diet   Yes.   Medications  No. Follow up visit  No.  Do you have questions or concerns about your Care? No.  Actions: * If pain score is 4 or above: No action needed, pain <4.

## 2017-08-10 ENCOUNTER — Encounter: Payer: Self-pay | Admitting: Gastroenterology

## 2017-08-19 ENCOUNTER — Encounter: Payer: Self-pay | Admitting: Allergy

## 2017-08-19 ENCOUNTER — Ambulatory Visit: Payer: 59 | Admitting: Allergy

## 2017-08-19 DIAGNOSIS — J4540 Moderate persistent asthma, uncomplicated: Secondary | ICD-10-CM | POA: Diagnosis not present

## 2017-08-19 DIAGNOSIS — J310 Chronic rhinitis: Secondary | ICD-10-CM

## 2017-08-19 MED ORDER — MOMETASONE FURO-FORMOTEROL FUM 200-5 MCG/ACT IN AERO: INHALATION_SPRAY | RESPIRATORY_TRACT | 5 refills | Status: DC

## 2017-08-19 MED ORDER — FLUTICASONE PROPIONATE 50 MCG/ACT NA SUSP: 2.0000 | Freq: Every day | NASAL | 5 refills | Status: DC

## 2017-08-19 MED ORDER — CETIRIZINE HCL 10 MG PO TABS
10.0000 mg | ORAL_TABLET | Freq: Every day | ORAL | 5 refills | Status: DC
Start: 2017-08-19 — End: 2019-11-15

## 2017-08-19 MED ORDER — MONTELUKAST SODIUM 10 MG PO TABS: ORAL_TABLET | ORAL | 1 refills | Status: DC

## 2017-08-19 MED ORDER — ALBUTEROL SULFATE HFA 108 (90 BASE) MCG/ACT IN AERS: INHALATION_SPRAY | RESPIRATORY_TRACT | 1 refills | Status: DC

## 2017-08-19 MED ORDER — OLOPATADINE HCL 0.6 % NA SOLN: NASAL | 5 refills | Status: DC

## 2017-08-19 NOTE — Patient Instructions (Addendum)
Asthma - Resume Dulera 200/5 2 puffs twice a day  - Singulair 10mg  daily - Albuterol 2 puffs every 4-6 hours as needed for cough, wheeze, shortness of breath, chest tightness.   Asthma control goals:   Full participation in all desired activities (may need albuterol before activity)  Albuterol use two time or less a week on average (not counting use with activity)  Cough interfering with sleep two time or less a month  Oral steroids no more than once a year  No hospitalizations  Mixed rhinitis - Patanase 2 sprays each nostril up to twice daily for nasal drainage - Flonase 2 sprays each nostril daily for nasal congestion. - Zyrtec 10mg  daily as needed - Saline nasal wash each evening in the shower and as needed.  Follow-up in 4-6 months or sooner if needed.

## 2017-08-19 NOTE — Progress Notes (Signed)
Follow-up Note  RE: SENITA CORREDOR MRN: [id number] DOB: 21-Nov-1955 Date of Office Visit: 08/19/2017   History of present illness: Rebecca [name] is a 62 y.o. female presenting today for follow-up of asthma and mixed rhinitis.  She was last seen in the office on November 19, 2016 by myself and FNP Ambs.   She states she was doing well up until about a month ago when she developed a viral URI and still has a lingering dry cough.  She states she has been using her albuterol daily for the cough and minimal relief.  She is supposed to be taking Dulera but states she didn't realize she was supposed to take this medication daily.  She states she needs refill for [facility].  She is also to take singulair daily.  She also feels like today her nose has been congested.  She states she does have flonase at home but as not used this and needs refill for her patanase.  She has zyrtec that she takes as needed and has not been needing to use it.   She otherwise denies any major health changes, surgeries or hospitalizations.  She does report having joint aches and is taking advil PM.    Review of systems: Review of Systems  Constitutional: Negative for chills, fever and malaise/fatigue.  HENT: Positive for congestion. Negative for ear discharge, ear pain, nosebleeds, sinus pain and sore throat.   Eyes: Negative for pain, discharge and redness.  Respiratory: Positive for cough. Negative for sputum production, shortness of breath and wheezing.   Cardiovascular: Negative for chest pain.  Gastrointestinal: Negative for abdominal pain, constipation, diarrhea, heartburn, nausea and vomiting.  Musculoskeletal: Positive for joint pain.  Skin: Negative for itching and rash.  Neurological: Negative for headaches.    All other systems negative unless noted above in HPI  Past medical/social/surgical/family history have been reviewed and are unchanged unless specifically indicated below.  No changes  Medication  List: Allergies as of 08/19/2017      Reactions   Hydrocodone Hives, Shortness Of Breath, Itching   Sulfa Antibiotics Hives      Medication List        Accurate as of 08/19/17 12:31 PM. Always use your most recent med list.          albuterol 108 (90 Base) MCG/ACT inhaler Commonly known as:  VENTOLIN HFA Use 2 puffs every 4 hours as needed for cough or wheeze.  May use  2 puffs 10-20 minutes prior to exercise.   atorvastatin 20 MG tablet Commonly known as:  LIPITOR Take 20 mg by mouth daily.   CALCIUM CITRATE + PO Take 500 mg by mouth 3 (three) times daily.   lisinopril 30 MG tablet Commonly known as:  PRINIVIL,ZESTRIL Take 1 tablet (30 mg total) by mouth daily.   mometasone-formoterol 200-5 MCG/ACT Aero Commonly known as:  DULERA Use 2 puffs twice daily to prevent cough or wheeze.  Rinse, gargle, and spit after use.   montelukast 10 MG tablet Commonly known as:  SINGULAIR Take one tablet each evening to prevent cough or wheeze.   multivitamin with minerals tablet Take 1 tablet by mouth daily with breakfast.   Olopatadine HCl 0.6 % Soln Commonly known as:  PATANASE Use 1-2 sprays in each nostril twice daily.   pravastatin 40 MG tablet Commonly known as:  PRAVACHOL Take 1 tablet (40 mg total) by mouth daily.   VITAMIN D-3 PO Take by mouth daily.  Known medication allergies: Allergies  Allergen Reactions  . Hydrocodone Hives, Shortness Of Breath and Itching  . Sulfa Antibiotics Hives     Physical examination: Blood pressure (!) 130/92, pulse 81, temperature 98.2 F (36.8 C), temperature source Oral, resp. rate 20, SpO2 95 %.  General: Alert, interactive, in no acute distress. HEENT: PERRLA, TMs pearly gray, turbinates moderately edematous without discharge, post-pharynx non erythematous. Neck: Supple without lymphadenopathy. Lungs: Clear to auscultation without wheezing, rhonchi or rales. {no increased work of breathing. CV: Normal S1, S2 without  murmurs. Abdomen: Nondistended, nontender. Skin: Warm and dry, without lesions or rashes. Extremities:  No clubbing, cyanosis or edema. Neuro:   Grossly intact.  Diagnositics/Labs:  Spirometry: FEV1: 1.38L  72%, FVC: 1.72L 70%.  Improved from previous study  Assessment and plan:   Asthma, moderate persistent - Resume Dulera 200/5 2 puffs twice a day  - Singulair 10mg  daily - Albuterol 2 puffs every 4-6 hours as needed for cough, wheeze, shortness of breath, chest tightness.   Asthma control goals:   Full participation in all desired activities (may need albuterol before activity)  Albuterol use two time or less a week on average (not counting use with activity)  Cough interfering with sleep two time or less a month  Oral steroids no more than once a year  No hospitalizations  Mixed rhinitis - Patanase 2 sprays each nostril up to twice daily for nasal drainage - Flonase 2 sprays each nostril daily for nasal congestion. - Zyrtec 10mg  daily as needed - Saline nasal wash each evening in the shower and as needed.  Follow-up in 4-6 months or sooner if needed.     Return in about 6 months (around 02/19/2018).  I appreciate the opportunity to take part in Sherin's care. Please do not hesitate to contact me with questions.  Sincerely,   Prudy Feeler, MD Allergy/Immunology Allergy [facility] [place] [facility] [place]

## 2017-08-20 DIAGNOSIS — G4733 Obstructive sleep apnea (adult) (pediatric): Secondary | ICD-10-CM | POA: Diagnosis not present

## 2017-08-27 DIAGNOSIS — G4733 Obstructive sleep apnea (adult) (pediatric): Secondary | ICD-10-CM | POA: Diagnosis not present

## 2017-08-29 NOTE — Progress Notes (Signed)
HPI:  Using dictation device. Unfortunately this device frequently misinterprets words/phrases.  Rebecca [name] is a pleasant 62 y.o. here for follow up. Chronic medical problems summarized below were reviewed for changes. Doing well for the most part. Some L knee pain started 1 month ago. Daily, worse with twisting, better with OTC analgesics. Preventing walking. She wants to hold statin for 1 week to see if this helps her aching. Denies CP, SOB, DOE, treatment intolerance or new symptoms.  Due for labs.  CPE 05/2017 HTN: -meds:lisinopril -Reports taking daily -Denies chest pain, shortness of breath, headaches  HLD/Hyperglycemia/Obesity: -pravastatin -seeing nutritionist, donetta floyd  -doing wt watchers, eating healthier and getting 7000 steps daily -wt 212 6/18 -->  193(12/18) --> 192 (08/2017)  Asthma and Allergy: -meds: alb prn, dulera, singulair, patanase - sees asthma/allergy specialist  OSA: -sees pulmonopgy, uses cpap   ROS: See pertinent positives and negatives per HPI.  Past Medical History:  Diagnosis Date  . Asthma   . Blindness, legal    L EYE  . Hearing loss   . Hyperlipidemia   . Hypertension   . Lapband May 2013 11/18/2011  . Migraines   . Obesity, Class III, BMI 40-49.9 (morbid obesity) ([facility]) 06/03/2011  . Sleep apnea    uses c-pap    Past Surgical History:  Procedure Laterality Date  . ABDOMINAL HYSTERECTOMY  2006   fibroids  . BREATH TEK H PYLORI  08/17/2011   Procedure: BREATH TEK H PYLORI;  Surgeon: Pedro Earls, MD;  Location: [facility];  Service: General;  Laterality: N/A;  PATIENT WILL COME AT 0715  . COLONOSCOPY  2008   @ [facility]   . EYE SURGERY     Patient unsure of surgery date. Left eye  . [place]   right  . LAPAROSCOPIC GASTRIC BANDING  10/20/2011   Procedure: LAPAROSCOPIC GASTRIC BANDING;  Surgeon: Pedro Earls, MD;  Location: [facility];  Service: General;  Laterality: N/A;    Family History  Problem  Relation Age of Onset  . Cancer Mother        breast  . Breast cancer Mother   . Alcohol abuse Father   . Heart disease Father 24       MI  . Hypertension Father   . Colon cancer Neg Hx     SOCIAL HX: No reported changes, except for less exercise since hurting her knee   Current Outpatient Medications:  .  albuterol (VENTOLIN HFA) 108 (90 Base) MCG/ACT inhaler, Use 2 puffs every 4 hours as needed for cough or wheeze.  May use  2 puffs 10-20 minutes prior to exercise., Disp: 1 Inhaler, Rfl: 1 .  atorvastatin (LIPITOR) 20 MG tablet, Take 20 mg by mouth daily., Disp: , Rfl:  .  Calcium Citrate-Vitamin D (CALCIUM CITRATE + PO), Take 500 mg by mouth 3 (three) times daily., Disp: , Rfl:  .  cetirizine (ZYRTEC ALLERGY) 10 MG tablet, Take 1 tablet (10 mg total) by mouth daily., Disp: 30 tablet, Rfl: 5 .  Cholecalciferol (VITAMIN D-3 PO), Take by mouth daily., Disp: , Rfl:  .  fluticasone (FLONASE) 50 MCG/ACT nasal spray, Place 2 sprays into both nostrils daily. For congestion, Disp: 16 g, Rfl: 5 .  lisinopril (PRINIVIL,ZESTRIL) 30 MG tablet, Take 1 tablet (30 mg total) by mouth daily., Disp: 90 tablet, Rfl: 3 .  mometasone-formoterol (DULERA) 200-5 MCG/ACT AERO, Use 2 puffs twice daily to prevent cough or wheeze.  Rinse, gargle, and spit  after use., Disp: 1 Inhaler, Rfl: 5 .  montelukast (SINGULAIR) 10 MG tablet, Take one tablet each evening to prevent cough or wheeze., Disp: 90 tablet, Rfl: 1 .  Multiple Vitamins-Minerals (MULTIVITAMIN WITH MINERALS) tablet, Take 1 tablet by mouth daily with breakfast. , Disp: , Rfl:  .  Olopatadine HCl (PATANASE) 0.6 % SOLN, Use 1-2 sprays in each nostril twice daily., Disp: 1 Bottle, Rfl: 5 .  Olopatadine HCl (PATANASE) 0.6 % SOLN, 2 sprays in each nostril 1-2 times a day for nasal drainage, Disp: 1 Bottle, Rfl: 5  Current Facility-Administered Medications:  .  0.9 %  sodium chloride infusion, 500 mL, Intravenous, Once, Nandigam, Kavitha V,  MD  EXAM:  Vitals:   08/30/17 0935  BP: 132/88  Pulse: 73  Temp: 98.3 F (36.8 C)    Body mass index is 34.05 kg/m.  GENERAL: vitals reviewed and listed above, alert, oriented, appears well hydrated and in no acute distress  HEENT: atraumatic, conjunttiva clear, no obvious abnormalities on inspection of external nose and ears  NECK: no obvious masses on inspection  LUNGS: clear to auscultation bilaterally, no wheezes, rales or rhonchi, good air movement  CV: HRRR, no peripheral edema  MS: moves all extremities without noticeable abnormality, no appreciable effusion, positive patellar crepitus on the left, negative Lockman, negative drawer, negative McMurray, negative anterior posterior drawer, tenderness to palpation in the anterior lateral joint line in the area of concern, neurovascularly intact distally  PSYCH: pleasant and cooperative, no obvious depression or anxiety  ASSESSMENT AND PLAN:  Discussed the following assessment and plan:  Pain in lateral portion of left knee - Plan: DG Knee Complete 4 Views Left -Suspect osteoarthritis -discussed potential etiologies and management -Opted to start with plain films, then decide on management  Essential hypertension - Plan: Basic metabolic panel, CBC Hyperlipidemia, unspecified hyperlipidemia type - Plan: Lipid panel BMI 34.0-34.9,adult -labs -Lifestyle recommendations -Short trial off statin after discussion of risk and benefits, then would start back on it and try alternate day dosing if it does improve the aching  -Patient advised to return or notify a doctor immediately if symptoms worsen or persist or new concerns arise.  Patient Instructions  Before leaving -labs -xray sheet  Get xray knee  Try 1 week off of the cholesterol medication. Then try lipitor.  We have ordered labs or studies at this visit. It can take up to 1-2 weeks for results and processing. IF results require follow up or explanation, we  will call you with instructions. Clinically stable results will be released to your [facility]. If you have not heard from [place] or cannot find your results in [facility] in 2 weeks please contact our office at [phone].  If you are not yet signed up for [facility], please consider signing up.          Lucretia Kern, DO

## 2017-08-30 ENCOUNTER — Encounter: Payer: Self-pay | Admitting: Family Medicine

## 2017-08-30 ENCOUNTER — Ambulatory Visit: Payer: 59 | Admitting: Family Medicine

## 2017-08-30 ENCOUNTER — Ambulatory Visit (INDEPENDENT_AMBULATORY_CARE_PROVIDER_SITE_OTHER)
Admission: RE | Admit: 2017-08-30 | Discharge: 2017-08-30 | Disposition: A | Discharge status: Home / Self Care | Payer: 59 | Source: Ambulatory Visit | Attending: Family Medicine | Admitting: Family Medicine

## 2017-08-30 VITALS — BP 132/88 | HR 73 | Temp 98.3°F | Ht 63.0 in | Wt 192.2 lb

## 2017-08-30 DIAGNOSIS — Z6834 Body mass index (BMI) 34.0-34.9, adult: Secondary | ICD-10-CM | POA: Diagnosis not present

## 2017-08-30 DIAGNOSIS — M25562 Pain in left knee: Secondary | ICD-10-CM

## 2017-08-30 DIAGNOSIS — E785 Hyperlipidemia, unspecified: Secondary | ICD-10-CM

## 2017-08-30 DIAGNOSIS — I10 Essential (primary) hypertension: Secondary | ICD-10-CM

## 2017-08-30 DIAGNOSIS — M1712 Unilateral primary osteoarthritis, left knee: Secondary | ICD-10-CM | POA: Diagnosis not present

## 2017-08-30 LAB — CBC
HCT: 37.4 % (ref 36.0–46.0)
HEMOGLOBIN: 12.5 g/dL (ref 12.0–15.0)
MCHC: 33.4 g/dL (ref 30.0–36.0)
MCV: 92.4 fl (ref 78.0–100.0)
PLATELETS: 373 10*3/uL (ref 150.0–400.0)
RBC: 4.05 Mil/uL (ref 3.87–5.11)
RDW: 13.4 % (ref 11.5–15.5)
WBC: 6.9 10*3/uL (ref 4.0–10.5)

## 2017-08-30 LAB — LIPID PANEL
CHOL/HDL RATIO: 3
Cholesterol: 174 mg/dL (ref 0–200)
HDL: 57.8 mg/dL (ref >39.00)
LDL CALC: 99 mg/dL (ref 0–99)
NonHDL: 116.47
TRIGLYCERIDES: 88 mg/dL (ref 0.0–149.0)
VLDL: 17.6 mg/dL (ref 0.0–40.0)

## 2017-08-30 LAB — BASIC METABOLIC PANEL
BUN: 18 mg/dL (ref 6–23)
CALCIUM: 9.5 mg/dL (ref 8.4–10.5)
CHLORIDE: 106 meq/L (ref 96–112)
CO2: 29 mEq/L (ref 19–32)
Creatinine, Ser: 0.7 mg/dL (ref 0.40–1.20)
GFR: 108.36 mL/min (ref >60.00)
Glucose, Bld: 87 mg/dL (ref 70–99)
POTASSIUM: 4.1 meq/L (ref 3.5–5.1)
SODIUM: 143 meq/L (ref 135–145)

## 2017-08-30 NOTE — Patient Instructions (Signed)
Before leaving -labs -xray sheet  Get xray knee  Try 1 week off of the cholesterol medication. Then try lipitor.  We have ordered labs or studies at this visit. It can take up to 1-2 weeks for results and processing. IF results require follow up or explanation, we will call you with instructions. Clinically stable results will be released to your [facility]. If you have not heard from [place] or cannot find your results in [facility] in 2 weeks please contact our office at [phone].  If you are not yet signed up for [facility], please consider signing up.

## 2017-08-31 ENCOUNTER — Other Ambulatory Visit: Payer: Self-pay | Admitting: *Deleted

## 2017-08-31 DIAGNOSIS — M25562 Pain in left knee: Secondary | ICD-10-CM

## 2017-09-07 ENCOUNTER — Telehealth: Payer: Self-pay | Admitting: Family Medicine

## 2017-09-07 NOTE — Telephone Encounter (Signed)
I left a detailed message with the information below at the pts cell number. 

## 2017-09-07 NOTE — Telephone Encounter (Signed)
Copied from [place] [id number]. Topic: Quick Communication - See Telephone Encounter >> Sep 07, 2017  1:02 PM Conception Chancy, NT wrote: CRM for notification. See Telephone encounter for: 09/07/17.  Patient is calling and states she would like something called in for the arthritis in her left knee. She states the orthopedic can not see her until 09/14/17. Please advise.   [id number] [place], [place]  [address] [place] [address]  Phone: [phone] Fax: [phone]

## 2017-09-07 NOTE — Telephone Encounter (Signed)
What has she tried?  If she has not used anything yet, would start with Tylenol- that would be the safest option.

## 2017-09-14 DIAGNOSIS — M1712 Unilateral primary osteoarthritis, left knee: Secondary | ICD-10-CM | POA: Diagnosis not present

## 2017-11-25 DIAGNOSIS — M1712 Unilateral primary osteoarthritis, left knee: Secondary | ICD-10-CM | POA: Diagnosis not present

## 2017-11-29 ENCOUNTER — Other Ambulatory Visit: Payer: Self-pay | Admitting: Family Medicine

## 2017-12-01 DIAGNOSIS — G4733 Obstructive sleep apnea (adult) (pediatric): Secondary | ICD-10-CM | POA: Diagnosis not present

## 2017-12-21 ENCOUNTER — Encounter: Payer: Self-pay | Admitting: Family Medicine

## 2017-12-24 ENCOUNTER — Encounter: Payer: Self-pay | Admitting: Allergy

## 2017-12-24 ENCOUNTER — Ambulatory Visit: Payer: 59 | Admitting: Allergy

## 2017-12-24 VITALS — BP 140/90 | HR 84 | Temp 98.2°F | Resp 20 | Ht 60.3 in | Wt 201.0 lb

## 2017-12-24 DIAGNOSIS — J310 Chronic rhinitis: Secondary | ICD-10-CM

## 2017-12-24 DIAGNOSIS — J4540 Moderate persistent asthma, uncomplicated: Secondary | ICD-10-CM | POA: Diagnosis not present

## 2017-12-24 MED ORDER — ALBUTEROL SULFATE (2.5 MG/3ML) 0.083% IN NEBU: 2.5000 mg | INHALATION_SOLUTION | RESPIRATORY_TRACT | 1 refills | Status: DC | PRN

## 2017-12-24 NOTE — Patient Instructions (Addendum)
Asthma - recent exacerbation likely due to viral illness which is improving - continue Dulera 200/5 2 puffs twice a day  - during asthma flares add Qvar 21mcg 2 puffs twice a day to your [facility] for added control.  Once symptoms have resolved discontinue use.   - Singulair 10mg  daily - Albuterol inhaler 2 puffs every 4-6 hours as needed for cough, wheeze, shortness of breath, chest tightness.   Asthma control goals:   Full participation in all desired activities (may need albuterol before activity)  Albuterol use two time or less a week on average (not counting use with activity)  Cough interfering with sleep two time or less a month  Oral steroids no more than once a year  No hospitalizations  Mixed rhinitis - Patanase 2 sprays each nostril up to twice daily for nasal drainage as needed - Flonase 2 sprays each nostril daily for nasal congestion and use for 1-2 weeks at a time before stopping once symptoms improve.   - Zyrtec 10mg  daily as needed - Saline nasal wash each evening in the shower and as needed.  Follow-up in 4 months or sooner if needed.

## 2017-12-24 NOTE — Progress Notes (Signed)
Follow-up Note  RE: Rebecca [name] MRN: [id number] DOB: 06-29-1955 Date of Office Visit: 12/24/2017   History of present illness: Rebecca [name] is a 63 y.o. female presenting today for follow-up of asthma and mixed rhinitis.  She was last seen in the office on August 19, 2017 by myself.  She states she has been doing well since this visit up until about 2 weeks ago.  Her family went on a cruise from June 23 to June 29.  She states they traveled to and from a cruise on a bus.  She states 1 of her family members got very sick with pneumonia secondary to influenza and her son also was sick with a URI.  She states about 2 weeks ago she woke up with a sore throat which then developed into cough and chest tightness.  She was using albuterol several times a day but has been able to wean down use as she has improved. However she does still feel like she is having some wheezing.  She is using Dulera 2 puffs twice a day and singulair daily.  She states she started feeling better this past Wednesday.  Now she states she just has mild congestion.    With her mixed rhinitis she denies any significant nasal congestion (prior to current illness), sneezing.  She uses zyrtec as needed about once a week.  She states he is not using her Patanase as she is not having really any nasal congestion.  She denies any major health changes, surgeries or hospitalizations since her last visit.  Review of systems: Review of Systems  Constitutional: Positive for malaise/fatigue. Negative for chills and fever.  HENT: Positive for congestion and sore throat. Negative for ear discharge, ear pain, nosebleeds and sinus pain.   Eyes: Negative for pain, discharge and redness.  Respiratory: Positive for cough, shortness of breath and wheezing. Negative for hemoptysis and sputum production.   Cardiovascular: Negative for chest pain.  Gastrointestinal: Negative for abdominal pain, constipation, diarrhea, heartburn, nausea and  vomiting.  Musculoskeletal: Negative for joint pain.  Skin: Negative for itching and rash.  Neurological: Negative for headaches.    All other systems negative unless noted above in HPI  Past medical/social/surgical/family history have been reviewed and are unchanged unless specifically indicated below.  No changes  Medication List: Allergies as of 12/24/2017      Reactions   Hydrocodone Hives, Shortness Of Breath, Itching   Sulfa Antibiotics Hives      Medication List        Accurate as of 12/24/17  3:34 PM. Always use your most recent med list.          albuterol 108 (90 Base) MCG/ACT inhaler Commonly known as:  VENTOLIN HFA Use 2 puffs every 4 hours as needed for cough or wheeze.  May use  2 puffs 10-20 minutes prior to exercise.   albuterol (2.5 MG/3ML) 0.083% nebulizer solution Commonly known as:  PROVENTIL Take 3 mLs (2.5 mg total) by nebulization every 4 (four) hours as needed for wheezing or shortness of breath.   atorvastatin 20 MG tablet Commonly known as:  LIPITOR Take 20 mg by mouth daily.   CALCIUM CITRATE + PO Take 500 mg by mouth 3 (three) times daily.   cetirizine 10 MG tablet Commonly known as:  ZYRTEC ALLERGY Take 1 tablet (10 mg total) by mouth daily.   fluticasone 50 MCG/ACT nasal spray Commonly known as:  FLONASE Place 2 sprays into both nostrils daily. For  congestion   lisinopril 30 MG tablet Commonly known as:  PRINIVIL,ZESTRIL Take 1 tablet (30 mg total) by mouth daily.   meloxicam 15 MG tablet Commonly known as:  MOBIC Take 15 mg by mouth daily.   mometasone-formoterol 200-5 MCG/ACT Aero Commonly known as:  DULERA Use 2 puffs twice daily to prevent cough or wheeze.  Rinse, gargle, and spit after use.   montelukast 10 MG tablet Commonly known as:  SINGULAIR Take one tablet each evening to prevent cough or wheeze.   multivitamin with minerals tablet Take 1 tablet by mouth daily with breakfast.   Olopatadine HCl 0.6 %  Soln Commonly known as:  PATANASE Use 1-2 sprays in each nostril twice daily.   VITAMIN D-3 PO Take by mouth daily.       Known medication allergies: Allergies  Allergen Reactions  . Hydrocodone Hives, Shortness Of Breath and Itching  . Sulfa Antibiotics Hives     Physical examination: Blood pressure 140/90, pulse 84, temperature 98.2 F (36.8 C), temperature source Oral, resp. rate 20, height 5' 0.3" (1.532 m), weight 201 lb (91.2 kg), SpO2 94 %.  General: Alert, interactive, in no acute distress. HEENT: PERRLA, TMs pearly gray, turbinates minimally edematous without discharge, post-pharynx non erythematous. Neck: Supple without lymphadenopathy. Lungs: Mildly decreased breath sounds with expiratory wheezing bilaterally. {no increased work of breathing.  After bronchodilator she had improved aeration without wheezing CV: Normal S1, S2 without murmurs. Abdomen: Nondistended, nontender. Skin: Warm and dry, without lesions or rashes. Extremities:  No clubbing, cyanosis or edema. Neuro:   Grossly intact.  Diagnositics/Labs:  Spirometry: FEV1: 1.32L 81%, FVC: 1.74L 83%, ratio consistent with Nonobstructive pattern  Assessment and plan:   Asthma, moderate persistent - recent exacerbation likely due to viral illness which is improving - continue Dulera 200/5 2 puffs twice a day  -In order to avoid oral steroids we will have her add Qvar 38mcg 2 puffs twice a day to your [facility] for added control.  Once symptoms have resolved discontinue use.  She can use Qvar in addition to her Dulera during asthma exacerbations going for - Singulair 10mg  daily - Albuterol inhaler 2 puffs every 4-6 hours as needed for cough, wheeze, shortness of breath, chest tightness.   Asthma control goals:   Full participation in all desired activities (may need albuterol before activity)  Albuterol use two time or less a week on average (not counting use with activity)  Cough interfering with sleep two  time or less a month  Oral steroids no more than once a year  No hospitalizations  Mixed rhinitis - Patanase 2 sprays each nostril up to twice daily for nasal drainage as needed - Flonase 2 sprays each nostril daily for nasal congestion and use for 1-2 weeks at a time before stopping once symptoms improve.   - Zyrtec 10mg  daily as needed - Saline nasal wash each evening in the shower and as needed.  Follow-up in 4 months or sooner if needed. I appreciate the opportunity to take part in Lida's care. Please do not hesitate to contact me with questions.  Sincerely,   Prudy Feeler, MD Allergy/Immunology [facility]ergy [facility] [place] [facility] [place]

## 2018-01-21 ENCOUNTER — Ambulatory Visit: Payer: 59 | Admitting: Allergy

## 2018-02-24 DIAGNOSIS — M170 Bilateral primary osteoarthritis of knee: Secondary | ICD-10-CM | POA: Diagnosis not present

## 2018-02-24 DIAGNOSIS — M25561 Pain in right knee: Secondary | ICD-10-CM | POA: Diagnosis not present

## 2018-02-24 DIAGNOSIS — M25562 Pain in left knee: Secondary | ICD-10-CM | POA: Diagnosis not present

## 2018-03-23 DIAGNOSIS — M170 Bilateral primary osteoarthritis of knee: Secondary | ICD-10-CM | POA: Diagnosis not present

## 2018-03-25 DIAGNOSIS — Z23 Encounter for immunization: Secondary | ICD-10-CM | POA: Diagnosis not present

## 2018-03-30 DIAGNOSIS — M170 Bilateral primary osteoarthritis of knee: Secondary | ICD-10-CM | POA: Diagnosis not present

## 2018-04-07 DIAGNOSIS — M170 Bilateral primary osteoarthritis of knee: Secondary | ICD-10-CM | POA: Diagnosis not present

## 2018-04-18 DIAGNOSIS — Z8042 Family history of malignant neoplasm of prostate: Secondary | ICD-10-CM | POA: Diagnosis not present

## 2018-04-18 DIAGNOSIS — Z803 Family history of malignant neoplasm of breast: Secondary | ICD-10-CM | POA: Diagnosis not present

## 2018-04-18 DIAGNOSIS — Z6839 Body mass index (BMI) 39.0-39.9, adult: Secondary | ICD-10-CM | POA: Diagnosis not present

## 2018-04-18 DIAGNOSIS — Z01419 Encounter for gynecological examination (general) (routine) without abnormal findings: Secondary | ICD-10-CM | POA: Diagnosis not present

## 2018-05-22 ENCOUNTER — Other Ambulatory Visit: Payer: Self-pay | Admitting: Allergy

## 2018-05-22 DIAGNOSIS — J310 Chronic rhinitis: Secondary | ICD-10-CM

## 2018-05-22 DIAGNOSIS — J4540 Moderate persistent asthma, uncomplicated: Secondary | ICD-10-CM

## 2018-05-23 NOTE — Telephone Encounter (Signed)
Courtesy refill  

## 2018-05-26 DIAGNOSIS — M170 Bilateral primary osteoarthritis of knee: Secondary | ICD-10-CM | POA: Diagnosis not present

## 2018-05-31 ENCOUNTER — Telehealth: Payer: Self-pay | Admitting: *Deleted

## 2018-05-31 NOTE — Telephone Encounter (Signed)
Fax received from [facility] requesting a surgical clearance.  I called the pt and left a detailed message for her to call back for an appt as Dr Maudie Mercury requires this prior to completing the clearance form.

## 2018-06-04 DIAGNOSIS — G4733 Obstructive sleep apnea (adult) (pediatric): Secondary | ICD-10-CM | POA: Diagnosis not present

## 2018-06-14 ENCOUNTER — Other Ambulatory Visit: Payer: Self-pay | Admitting: Family Medicine

## 2018-06-20 NOTE — Progress Notes (Signed)
No chief complaint on file.   HPI:  Patient is seen for optimization of general medical care prior to surgery. Surgery type: total knee arthroplasty with Dr. Gaynelle Arabian She reports the knee pain has been severe and interferes with activities. Date of surgery: March 2020  PMH Hypertension, Hyperlipidemia, Hyperglycemia, Obesity, asthma (sees asthma and allergy specialist (Dr. Duard Brady Padgett)for managment), OSA - sees pulmonologist for management. Per epic review she will be having labs drawn prior to surgery including cmp and cbc, clotting labs. She has not been in for follow up in some time and is due for hgba1c and lipid check. She had run out of her lisinopril for about 1 week and just restarted it yesterday. She stopped taking her cholesterol medication several months ago. Thought it caused some muscle aches. She just started weight watchers yesterday and is motivated to loose weight and start getting more exercise.  Kidney disease? No Prior surgeries/Issues following anesthesia? See PSH below - denies hx complications. Hx MI, heart arrythmia, CHF, angina or stroke? none Epilepsy or Seizures? none Arthritis or problems with neck or jaw? none Thyroid disease? none Liver disease? none Asthma, COPD or chronic lung disease? Yes sees pulmonologist/asthma/OSA specialist for managment Diabetes? none (Needs to be evaluated by anesthesia if yes to these questions.) Yes  Other: Poor nutrition, Frail or other: no  METS:  ?Can take care of self, such as eat, dress, or use the toilet (1 MET). yes ?Can walk up a flight of steps or a hill (4 METs).yes ?Can do heavy work around the house such as scrubbing floors or lifting or moving heavy furniture (between 4 and 10 METs). yes ?Can participate in strenuous sports such as swimming, singles tennis, football, basketball, and skiing (>10 METs) not currently doing . AHA Risks: Major predictors that require intensive management and may lead to  delay in or cancellation of the operative procedure unless emergent: NONE  . Unstable coronary syndromes including unstable or severe angina or recent MI  . Decompensated heart failure including NYHA functional class IV or worsening or new-onset HF  . Significant arrhythmias including high grade AV block, symptomatic ventricular arrhythmias, supraventricular arrhythmias with ventricular rate >100 bpm at rest, symptomatic bradycardia, and newly recognized ventricular tachycardia  . Severe heart valve disease including severe aortic stenosis or symptomatic mitral stenosis   Other clinical predictors that warrant careful assessment of current status: NONE - known - checking labs today  . History of ischemic heart disease . History of cerebrovascular disease  . History of compensated heart failure or prior heart failure  . Diabetes mellitus  . Renal insufficiency  Type of surgery and Risk: 1) High risk (reported risk of cardiac death or nonfatal myocardial infarction [MI] often greater than 5 percent):  [name] Aortic and other major vascular surgery  . Peripheral artery surgery   2)Intermediate risk (reported risk of cardiac death or nonfatal MI generally 1 to 5 percent):  [name] Carotid endarterectomy  . Head and neck surgery  . Intraperitoneal and intrathoracic surgery  . Orthopedic surgery  . Prostate surgery   3)Low risk (reported risk of cardiac death or nonfatal MI generally less than 1 percent):  [name] Ambulatory surgery  . Endoscopic procedures  . Superficial procedure  . Cataract surgery  . Breast surgery  Medications that need to be addressed prior to surgery: None Discontinue acei/arbs/non-statin lipid lowering drugs day of surgery ASA stop 7 days before or discuss with cardiology if CV risks, other anticoagulants discuss with cardiology.  ROS: See pertinent positives and negatives per HPI. 11 point ROS negative except where noted. Specifically denies CP, SOB, DOE, palpitations, weakness,  malaise, dizziness, unexplained wt loss, swelling, fevers, rashes, headaches, wheezing, coughing, breathing difficulty, any recent alb use, bleeding.  Past Medical History:  Diagnosis Date  . Asthma   . Blindness, legal    L EYE  . Hearing loss   . Hyperlipidemia   . Hypertension   . Lapband May 2013 11/18/2011  . Migraines   . Obesity, Class III, BMI 40-49.9 (morbid obesity) ([place]) 06/03/2011  . Sleep apnea    uses c-pap    Past Surgical History:  Procedure Laterality Date  . ABDOMINAL HYSTERECTOMY  2006   fibroids  . BREATH TEK H PYLORI  08/17/2011   Procedure: BREATH TEK H PYLORI;  Surgeon: Pedro Earls, MD;  Location: [facility];  Service: General;  Laterality: N/A;  PATIENT WILL COME AT 0715  . COLONOSCOPY  2008   @ [facility]   . EYE SURGERY     Patient unsure of surgery date. Left eye  . [place]   right  . LAPAROSCOPIC GASTRIC BANDING  10/20/2011   Procedure: LAPAROSCOPIC GASTRIC BANDING;  Surgeon: Pedro Earls, MD;  Location: [facility];  Service: General;  Laterality: N/A;    Family History  Problem Relation Age of Onset  . Cancer Mother        breast  . Breast cancer Mother   . Alcohol abuse Father   . Heart disease Father 75       MI  . Hypertension Father   . Colon cancer Neg Hx     Social History   Socioeconomic History  . Marital status: Married    Spouse name: Not on file  . Number of children: Not on file  . Years of education: Not on file  . Highest education level: Not on file  Occupational History  . Not on file  Social Needs  . Financial resource strain: Not on file  . Food insecurity:    Worry: Not on file    Inability: Not on file  . Transportation needs:    Medical: Not on file    Non-medical: Not on file  Tobacco Use  . Smoking status: Former Smoker    Packs/day: 0.50    Years: 15.00    Pack years: 7.50    Types: Cigarettes    Last attempt to quit: 06/02/1994    Years since quitting: 24.0  . Smokeless tobacco: Never  Used  Substance and Sexual Activity  . Alcohol use: No  . Drug use: No  . Sexual activity: Not on file  Lifestyle  . Physical activity:    Days per week: Not on file    Minutes per session: Not on file  . Stress: Not on file  Relationships  . Social connections:    Talks on phone: Not on file    Gets together: Not on file    Attends religious service: Not on file    Active member of club or organization: Not on file    Attends meetings of clubs or organizations: Not on file    Relationship status: Not on file  Other Topics Concern  . 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  albuterol (PROVENTIL) (2.5 MG/3ML) 0.083% nebulizer solution, Take 3 mLs (2.5 mg total) by nebulization every 4 (four) hours as needed for wheezing or shortness of breath., Disp: 75 mL, Rfl: 1 .  albuterol (VENTOLIN HFA) 108 (90 Base) MCG/ACT inhaler, Use 2 puffs every 4 hours as needed for cough or wheeze.  May use  2 puffs 10-20 minutes prior to exercise., Disp: 1 Inhaler, Rfl: 1 .  atorvastatin (LIPITOR) 20 MG tablet, Take 20 mg by mouth daily., Disp: , Rfl:  .  Calcium Citrate-Vitamin D (CALCIUM CITRATE + PO), Take 500 mg by mouth 3 (three) times daily., Disp: , Rfl:  .  cetirizine (ZYRTEC ALLERGY) 10 MG tablet, Take 1 tablet (10 mg total) by mouth daily., Disp: 30 tablet, Rfl: 5 .  Cholecalciferol (VITAMIN D-3 PO), Take by mouth daily., Disp: , Rfl:  .  fluticasone (FLONASE) 50 MCG/ACT nasal spray, Place 2 sprays into both nostrils daily. For congestion, Disp: 16 g, Rfl: 5 .  lisinopril (PRINIVIL,ZESTRIL) 30 MG tablet, TAKE 1 TABLET BY MOUTH EVERY DAY, Disp: 90 tablet, Rfl: 1 .  meloxicam  (MOBIC) 15 MG tablet, Take 15 mg by mouth daily., Disp: , Rfl: 2 .  mometasone-formoterol (DULERA) 200-5 MCG/ACT AERO, Use 2 puffs twice daily to prevent cough or wheeze.  Rinse, gargle, and spit after use., Disp: 1 Inhaler, Rfl: 5 .  montelukast (SINGULAIR) 10 MG tablet, TAKE ONE TABLET EACH EVENING TO PREVENT COUGH OR WHEEZE, Disp: 30 tablet, Rfl: 0 .  Multiple Vitamins-Minerals (MULTIVITAMIN WITH MINERALS) tablet, Take 1 tablet by mouth daily with breakfast. , Disp: , Rfl:  .  Olopatadine HCl (PATANASE) 0.6 % SOLN, Use 1-2 sprays in each nostril twice daily., Disp: 1 Bottle, Rfl: 5 .  hydrochlorothiazide (HYDRODIURIL) 12.5 MG tablet, Take 1 tablet (12.5 mg total) by mouth daily., Disp: 90 tablet, Rfl: 3  Current Facility-Administered Medications:  .  0.9 %  sodium chloride infusion, 500 mL, Intravenous, Once, Nandigam, Kavitha V, MD  EXAM:  Vitals:   06/21/18 1120  BP: 136/86  Pulse: 74  Temp: 98.7 F (37.1 C)    Body mass index is 40.47 kg/m.  GENERAL: vitals reviewed and listed above, alert, oriented, appears well hydrated and in no acute distress  HEENT: atraumatic, conjunttiva clear, no obvious abnormalities on inspection of external nose and ears  NECK: no obvious masses on inspection, no carotid bruits  LUNGS: clear to auscultation bilaterally, no wheezes, rales or rhonchi, good air movement  CV: HRRR, no peripheral edema, no JVD, BP normal range, normal radial pulses  MS: moves all extremities without noticeable abnormality  PSYCH: pleasant and cooperative, no obvious depression or anxiety  ASSESSMENT AND PLAN:  Discussed the following assessment and plan: More than 50% of over 40 minutes spent in total in caring for this patient was spent face-to-face with the patient, counseling and/or coordinating care.    Pre-op evaluation  Hyperlipidemia, unspecified hyperlipidemia type  Essential hypertension - Plan: Basic metabolic panel, CBC  Hyperglycemia - Plan:  Hemoglobin A1c  Morbid obesity (HCC) - Plan: Lipid panel  Osteoarthritis of both knees, unspecified osteoarthritis type  Assessment: -Risk factors: morbid obesity, hyperlipidemia, hypertension, asthma, OSA, hyperglycemia -Surgery Risks:intermediate -age, nutritional status, fraility: good nutritional status, age <65, no fraility -functional capacity: > 4 METs  wethout symptoms -comorbidities: morbid obesity, hyperlipidemia, hypertension, asthma, OSA, hyperglycemia Patient Specific Risks: patient is low-mod risk for intermediate risks surgery   Recommendations for optimizing general medical care prior to surgery: -advised patient to discuss specific risks morbidity and mortality of surgery with surgeon, CV risks discussed with patient -have advised pt will send this note to the surgeon with recommendations for optimization of health prior to surgery. Have discussed with patient will defer to surgeon/anesthesia team for any testing they feel is needed prior to surgery beyond what we are doing today and to delay surgery if they feel risks outway benefits. -specific medical recommendations for this patient at this time included: -I advised would be best to reduce weight 20-30 lbs prior to surgery and that she should discuss this with surgical and anesthesia team (lengthy discussion about a healthy lifestyle for wt reduction) -also advised getting blood pressure under control and we are adjusting her blood pressure regimen today with follow up in 1 month. Refilled lisinopril and added hctz after discussion options, risks, implications. -discussed risks/benefits statin and she has opted to restart her statin and call if any difficulty with side effects -advised patient will defer to surgeon for post-op DVT prophylaxis and post op care -labs today for sugar and fasting lipids check as has been some time since she has been in to see [place], will also check BMP - look like she also will have CMP and CBC,  clotting labs prior to surgery per orders in epic from surgery office. Advised if diabetes then would advised cardiology preop evaluation. -recommended tetanus booster since due -recommend surgeon consultation with her asthma/lung specialist prior to surgery and that anesthesia team is aware of her hx of asthma and OSA -recommended mammogram as is due -in terms of medications I prescribe, I would advise holding the lisinopril the day of surgery -form for pre-op optimization of general medical care prior to surgery faxed to surgeon office along with copy of this evaluation (sending this note in Epic and will have my assistant fax as well). Appreciate care. Let her know that I have had a number of patients see Dr. Wynelle Link for surgery and that they have had very good results. -follow up 1 months  Patient Instructions  BEFORE YOU LEAVE: -tetanus booster recommended -labs -follow up:  1 month  Start the hydrochlorothiazide and take once daily. Hold your lisinopril the day of surgery only.  Recommend a 20-30 lb weight reduction.  Continue healthy low sugar diet and work on regular exercises - core, upper body, walking on flat surface and cycling if your orthopedic surgeon is ok with this.  You are dur for your mammogram.  Follow surgeons recommendations in terms of further testing, delaying surgery and medication management prior to and during and after surgery. I will send my notes to your surgeon's office.  Talk with anesthesiology and your lung specialist prior to surgery.  We have ordered labs or studies at this visit. It can take up to 1-2 weeks for results and processing. IF results require follow up or explanation, we will call you with instructions. Clinically stable results will be released to your [facility]. If you have not heard from [place] or cannot find your results in [facility] in 2 weeks please contact our office at [phone].  If you are not yet signed up for [facility], please consider  signing up.   We recommend the following healthy lifestyle for LIFE: 1) Small portions. But, make sure to get regular (at least  3 per day), healthy meals and small healthy snacks if needed.  2) Eat a healthy clean diet.   TRY TO EAT: -at least 5-7 servings of low sugar, colorful, and nutrient rich vegetables per day (not corn, potatoes or bananas.) -berries are the best choice if you wish to eat fruit (only eat small amounts if trying to reduce weight)  -lean meets (fish, white meat of chicken or [place]) -vegan proteins for some meals - beans or tofu, whole grains, nuts and seeds -Replace bad fats with good fats - good fats include: fish, nuts and seeds, canola oil, olive oil -small amounts of low fat or non fat dairy -small amounts of100 % whole grains - check the lables -drink plenty of water  AVOID: -SUGAR, sweets, anything with added sugar, corn syrup or sweeteners - must read labels as even foods advertised as "healthy" often are loaded with sugar -if you must have a sweetener, small amounts of stevia may be best -sweetened beverages and artificially sweetened beverages -simple starches (rice, bread, potatoes, pasta, chips, etc - small amounts of 100% whole grains are ok) -red meat, pork, butter -fried foods, fast food, processed food, excessive dairy, eggs and coconut.  3)Get at least 150 minutes of sweaty aerobic exercise per week.  4)Reduce stress - consider counseling, meditation and relaxation to balance other aspects of your life.      Lucretia Kern

## 2018-06-21 ENCOUNTER — Encounter: Payer: Self-pay | Admitting: Family Medicine

## 2018-06-21 ENCOUNTER — Ambulatory Visit: Payer: 59 | Admitting: Family Medicine

## 2018-06-21 VITALS — BP 136/86 | HR 74 | Temp 98.7°F | Ht 60.0 in | Wt 207.2 lb

## 2018-06-21 DIAGNOSIS — M170 Bilateral primary osteoarthritis of knee: Secondary | ICD-10-CM

## 2018-06-21 DIAGNOSIS — Z01818 Encounter for other preprocedural examination: Secondary | ICD-10-CM | POA: Diagnosis not present

## 2018-06-21 DIAGNOSIS — E785 Hyperlipidemia, unspecified: Secondary | ICD-10-CM | POA: Diagnosis not present

## 2018-06-21 DIAGNOSIS — R739 Hyperglycemia, unspecified: Secondary | ICD-10-CM | POA: Diagnosis not present

## 2018-06-21 DIAGNOSIS — I10 Essential (primary) hypertension: Secondary | ICD-10-CM

## 2018-06-21 LAB — BASIC METABOLIC PANEL
BUN: 14 mg/dL (ref 6–23)
CALCIUM: 9.9 mg/dL (ref 8.4–10.5)
CO2: 29 mEq/L (ref 19–32)
CREATININE: 0.76 mg/dL (ref 0.40–1.20)
Chloride: 104 mEq/L (ref 96–112)
GFR: 98.29 mL/min (ref >60.00)
Glucose, Bld: 80 mg/dL (ref 70–99)
Potassium: 4 mEq/L (ref 3.5–5.1)
Sodium: 141 mEq/L (ref 135–145)

## 2018-06-21 LAB — HEMOGLOBIN A1C: Hgb A1c MFr Bld: 5.8 % (ref 4.6–6.5)

## 2018-06-21 LAB — LIPID PANEL
CHOLESTEROL: 245 mg/dL — AB (ref 0–200)
HDL: 50.1 mg/dL (ref >39.00)
LDL Cholesterol: 155 mg/dL — ABNORMAL HIGH (ref 0–99)
NonHDL: 194.86
TRIGLYCERIDES: 197 mg/dL — AB (ref 0.0–149.0)
Total CHOL/HDL Ratio: 5
VLDL: 39.4 mg/dL (ref 0.0–40.0)

## 2018-06-21 LAB — CBC
HCT: 40.8 % (ref 36.0–46.0)
Hemoglobin: 13.5 g/dL (ref 12.0–15.0)
MCHC: 33 g/dL (ref 30.0–36.0)
MCV: 91.9 fl (ref 78.0–100.0)
Platelets: 381 10*3/uL (ref 150.0–400.0)
RBC: 4.44 Mil/uL (ref 3.87–5.11)
RDW: 13.3 % (ref 11.5–15.5)
WBC: 7.3 10*3/uL (ref 4.0–10.5)

## 2018-06-21 MED ORDER — HYDROCHLOROTHIAZIDE 12.5 MG PO TABS: 12.5000 mg | ORAL_TABLET | Freq: Every day | ORAL | 3 refills | Status: DC

## 2018-06-21 NOTE — Patient Instructions (Addendum)
BEFORE YOU LEAVE: -tetanus booster recommended -labs -follow up:  1 month  Start the hydrochlorothiazide and take once daily. Hold your lisinopril the day of surgery only.  Recommend a 20-30 lb weight reduction.  Continue healthy low sugar diet and work on regular exercises - core, upper body, walking on flat surface and cycling if your orthopedic surgeon is ok with this.  You are dur for your mammogram.  Follow surgeons recommendations in terms of further testing, delaying surgery and medication management prior to and during and after surgery. I will send my notes to your surgeon's office.  Talk with anesthesiology and your lung specialist prior to surgery.  We have ordered labs or studies at this visit. It can take up to 1-2 weeks for results and processing. IF results require follow up or explanation, we will call you with instructions. Clinically stable results will be released to your [facility]. If you have not heard from [place] or cannot find your results in [facility] in 2 weeks please contact our office at [phone].  If you are not yet signed up for [facility], please consider signing up.   We recommend the following healthy lifestyle for LIFE: 1) Small portions. But, make sure to get regular (at least 3 per day), healthy meals and small healthy snacks if needed.  2) Eat a healthy clean diet.   TRY TO EAT: -at least 5-7 servings of low sugar, colorful, and nutrient rich vegetables per day (not corn, potatoes or bananas.) -berries are the best choice if you wish to eat fruit (only eat small amounts if trying to reduce weight)  -lean meets (fish, white meat of chicken or [place]) -vegan proteins for some meals - beans or tofu, whole grains, nuts and seeds -Replace bad fats with good fats - good fats include: fish, nuts and seeds, canola oil, olive oil -small amounts of low fat or non fat dairy -small amounts of100 % whole grains - check the lables -drink plenty of  water  AVOID: -SUGAR, sweets, anything with added sugar, corn syrup or sweeteners - must read labels as even foods advertised as "healthy" often are loaded with sugar -if you must have a sweetener, small amounts of stevia may be best -sweetened beverages and artificially sweetened beverages -simple starches (rice, bread, potatoes, pasta, chips, etc - small amounts of 100% whole grains are ok) -red meat, pork, butter -fried foods, fast food, processed food, excessive dairy, eggs and coconut.  3)Get at least 150 minutes of sweaty aerobic exercise per week.  4)Reduce stress - consider counseling, meditation and relaxation to balance other aspects of your life.

## 2018-06-22 NOTE — Addendum Note (Signed)
Addended by: Agnes Lawrence on: 06/22/2018 09:38 AM   Modules accepted: Orders

## 2018-06-28 DIAGNOSIS — J45901 Unspecified asthma with (acute) exacerbation: Secondary | ICD-10-CM | POA: Diagnosis not present

## 2018-06-29 ENCOUNTER — Encounter: Payer: Self-pay | Admitting: Allergy

## 2018-06-29 ENCOUNTER — Ambulatory Visit: Payer: 59 | Admitting: Allergy

## 2018-06-29 DIAGNOSIS — J4541 Moderate persistent asthma with (acute) exacerbation: Secondary | ICD-10-CM | POA: Diagnosis not present

## 2018-06-29 DIAGNOSIS — J310 Chronic rhinitis: Secondary | ICD-10-CM | POA: Diagnosis not present

## 2018-06-29 MED ORDER — MOMETASONE FURO-FORMOTEROL FUM 200-5 MCG/ACT IN AERO: INHALATION_SPRAY | RESPIRATORY_TRACT | 5 refills | Status: DC

## 2018-06-29 MED ORDER — ALBUTEROL SULFATE HFA 108 (90 BASE) MCG/ACT IN AERS: INHALATION_SPRAY | RESPIRATORY_TRACT | 1 refills | Status: DC

## 2018-06-29 MED ORDER — FLUTICASONE PROPIONATE 50 MCG/ACT NA SUSP: 2.0000 | Freq: Every day | NASAL | 5 refills | Status: DC

## 2018-06-29 MED ORDER — MONTELUKAST SODIUM 10 MG PO TABS: 10.0000 mg | ORAL_TABLET | Freq: Every day | ORAL | 5 refills | Status: DC

## 2018-06-29 NOTE — Progress Notes (Signed)
Follow-up Note  RE: Rebecca [name] MRN: [id number] DOB: [birth year] Date of Office Visit: 06/29/2018   History of present illness: Rebecca [name] is a 63 y.o. female presenting today for sick visit today.  She has history of asthma and mixed rhinitis.   She was last seen in the office on 12/24/17 by myself.   She states she was cleaning her home and had sprayed a lot of OdorBan but states that set off her asthma with coughing, wheezing, chest tightness.  Symptoms started about 6 days ago.  She was using albuterol multiple times a day during the past week.  She states her symptoms worsened to the point that she went to UC yesterday.  She still takes her Dulera 2 puffs twice a day as well as singulair daily.   She does not have any more Qvar.   She states UC gave her a breathing treatment and prescribed a course of prednisone for 5 days taper.  She states she is feeling better 90% today.    She states up until she cleaned her home she was doing very well.  She states she had not needed to use her patanase or flonase since last visit nor zyrtec.    She states sadly her sister passed away recently from lung and brain cancer.    Review of systems: Review of Systems  Constitutional: Negative for chills, fever and malaise/fatigue.  HENT: Negative for congestion, ear discharge, nosebleeds, sinus pain and sore throat.   Eyes: Negative for pain, discharge and redness.  Respiratory: Positive for cough, shortness of breath and wheezing. Negative for hemoptysis and sputum production.   Cardiovascular: Negative for chest pain.  Gastrointestinal: Negative for abdominal pain, constipation, diarrhea, heartburn, nausea and vomiting.  Musculoskeletal: Negative for joint pain.  Skin: Negative for itching and rash.  Neurological: Negative for headaches.    All other systems negative unless noted above in HPI  Past medical/social/surgical/family history have been reviewed and are unchanged unless  specifically indicated below.  No changes  Medication List: Allergies as of 06/29/2018      Reactions   Hydrocodone Hives, Shortness Of Breath, Itching   Sulfa Antibiotics Hives      Medication List       Accurate as of June 29, 2018  3:50 PM. Always use your most recent med list.        albuterol 108 (90 Base) MCG/ACT inhaler Commonly known as:  VENTOLIN HFA Use 2 puffs every 4 hours as needed for cough or wheeze.  May use  2 puffs 10-20 minutes prior to exercise.   albuterol (2.5 MG/3ML) 0.083% nebulizer solution Commonly known as:  PROVENTIL Take 3 mLs (2.5 mg total) by nebulization every 4 (four) hours as needed for wheezing or shortness of breath.   atorvastatin 20 MG tablet Commonly known as:  LIPITOR Take 20 mg by mouth daily.   CALCIUM CITRATE + PO Take 500 mg by mouth 3 (three) times daily.   cetirizine 10 MG tablet Commonly known as:  ZYRTEC ALLERGY Take 1 tablet (10 mg total) by mouth daily.   fluticasone 50 MCG/ACT nasal spray Commonly known as:  FLONASE Place 2 sprays into both nostrils daily. For congestion   hydrochlorothiazide 12.5 MG tablet Commonly known as:  HYDRODIURIL Take 1 tablet (12.5 mg total) by mouth daily.   lisinopril 30 MG tablet Commonly known as:  PRINIVIL,ZESTRIL TAKE 1 TABLET BY MOUTH EVERY DAY   meloxicam 15 MG tablet Commonly known as:  MOBIC Take 15 mg by mouth daily.   mometasone-formoterol 200-5 MCG/ACT Aero Commonly known as:  DULERA Use 2 puffs twice daily to prevent cough or wheeze.  Rinse, gargle, and spit after use.   montelukast 10 MG tablet Commonly known as:  SINGULAIR TAKE ONE TABLET EACH EVENING TO PREVENT COUGH OR WHEEZE   multivitamin with minerals tablet Take 1 tablet by mouth daily with breakfast.   Olopatadine HCl 0.6 % Soln Commonly known as:  PATANASE Use 1-2 sprays in each nostril twice daily.   pravastatin 40 MG tablet Commonly known as:  PRAVACHOL pravastatin 40 mg tablet   predniSONE  10 MG (21) Tbpk tablet Commonly known as:  STERAPRED UNI-PAK 21 TAB   VITAMIN D-3 PO Take by mouth daily.       Known medication allergies: Allergies  Allergen Reactions  . Hydrocodone Hives, Shortness Of Breath and Itching  . Sulfa Antibiotics Hives     Physical examination: Blood pressure 138/70, pulse 92, temperature 98.3 F (36.8 C), temperature source Oral, resp. rate 20, SpO2 92 %.  General: Alert, interactive, in no acute distress. HEENT: PERRLA, TMs pearly gray, turbinates non-edematous without discharge, post-pharynx non erythematous. Neck: Supple without lymphadenopathy. Lungs: Mildly decreased breath sounds with expiratory wheezing bilaterally. {no increased work of breathing. CV: Normal S1, S2 without murmurs. Abdomen: Nondistended, nontender. Skin: Warm and dry, without lesions or rashes. Extremities:  No clubbing, cyanosis or edema. Neuro:   Grossly intact.  Diagnositics/Labs: Spirometry: FEV1: 1.34L 82%, FVC: 1.61L 77%, ratio consistent with nonobstructive pattern for age  Assessment and plan:   Asthma, mod persistent with exacerbation - recent exacerbation due to noxious smell trigger from use of OdorBan - continue Dulera 200/5 2 puffs twice a day  - during asthma flares add Qvar 28mcg 2 puffs twice a day to your [facility] for added control.  Once symptoms have resolved discontinue use.  Sample Qvar provided today.   - Singulair 10mg  daily - Continue prednisone course as directed by the urgent care - Albuterol inhaler 2 puffs or albuterol 1 vial via neb every 4-6 hours as needed for cough, wheeze, shortness of breath, chest tightness.   Asthma control goals:   Full participation in all desired activities (may need albuterol before activity)  Albuterol use two time or less a week on average (not counting use with activity)  Cough interfering with sleep two time or less a month  Oral steroids no more than once a year  No hospitalizations  Mixed  rhinitis - Patanase 2 sprays each nostril up to twice daily for nasal drainage as needed - Flonase 2 sprays each nostril daily for nasal congestion and use for 1-2 weeks at a time before stopping once symptoms improve.   - Zyrtec 10mg  daily as needed - Saline nasal wash each evening in the shower and as needed.  Follow-up in 4-6 months or sooner if needed.  I appreciate the opportunity to take part in [place]'s care. Please do not hesitate to contact me with questions.  Sincerely,   Prudy Feeler, MD Allergy/Immunology [facility]

## 2018-06-29 NOTE — Patient Instructions (Addendum)
Asthma - recent exacerbation due to noxious smell from use of OdorBan - continue Dulera 200/5 2 puffs twice a day  - during asthma flares add Qvar 72mcg 2 puffs twice a day to your [facility] for added control.  Once symptoms have resolved discontinue use.  Sample Qvar provided today.   - Singulair 10mg  daily - Continue prednisone course as directed by the urgent care - Albuterol inhaler 2 puffs every 4-6 hours as needed for cough, wheeze, shortness of breath, chest tightness.   Asthma control goals:   Full participation in all desired activities (may need albuterol before activity)  Albuterol use two time or less a week on average (not counting use with activity)  Cough interfering with sleep two time or less a month  Oral steroids no more than once a year  No hospitalizations  Mixed rhinitis - Patanase 2 sprays each nostril up to twice daily for nasal drainage as needed - Flonase 2 sprays each nostril daily for nasal congestion and use for 1-2 weeks at a time before stopping once symptoms improve.   - Zyrtec 10mg  daily as needed - Saline nasal wash each evening in the shower and as needed.  Follow-up in 4-6 months or sooner if needed.

## 2018-07-05 DIAGNOSIS — Z809 Family history of malignant neoplasm, unspecified: Secondary | ICD-10-CM | POA: Diagnosis not present

## 2018-07-24 NOTE — Progress Notes (Deleted)
HPI:  Using dictation device. Unfortunately this device frequently misinterprets words/phrases.  Follow up hypertension, obesity, hyperglycemia. She had run out of lisinopril preior to her last visit and stopped her cholesterol medication. She had just started weight watchers. We restarted her cholesterol medication, sent her for preop clearance for surgery given very elevated cholesterol, htn and morbid obesity. Today reports. Denies.  ROS: See pertinent positives and negatives per HPI.  Past Medical History:  Diagnosis Date  . Asthma   . Blindness, legal    L EYE  . Hearing loss   . Hyperlipidemia   . Hypertension   . Lapband May 2013 11/18/2011  . Migraines   . Obesity, Class III, BMI 40-49.9 (morbid obesity) ([place]) 06/03/2011  . Sleep apnea    uses c-pap    Past Surgical History:  Procedure Laterality Date  . ABDOMINAL HYSTERECTOMY  2006   fibroids  . BREATH TEK H PYLORI  08/17/2011   Procedure: BREATH TEK H PYLORI;  Surgeon: Pedro Earls, MD;  Location: [facility];  Service: General;  Laterality: N/A;  PATIENT WILL COME AT 0715  . COLONOSCOPY  2008   @ [facility]   . EYE SURGERY     Patient unsure of surgery date. Left eye  . [place]   right  . LAPAROSCOPIC GASTRIC BANDING  10/20/2011   Procedure: LAPAROSCOPIC GASTRIC BANDING;  Surgeon: Pedro Earls, MD;  Location: [facility];  Service: General;  Laterality: N/A;    Family History  Problem Relation Age of Onset  . Cancer Mother        breast  . Breast cancer Mother   . Alcohol abuse Father   . Heart disease Father 48       MI  . Hypertension Father   . Colon cancer Neg Hx     SOCIAL HX: ***   Current Outpatient Medications:  .  albuterol (PROVENTIL) (2.5 MG/3ML) 0.083% nebulizer solution, Take 3 mLs (2.5 mg total) by nebulization every 4 (four) hours as needed for wheezing or shortness of breath. (Patient not taking: Reported on 06/29/2018), Disp: 75 mL, Rfl: 1 .  albuterol (VENTOLIN HFA) 108 (90  Base) MCG/ACT inhaler, Use 2 puffs every 4 hours as needed for cough or wheeze.  May use  2 puffs 10-20 minutes prior to exercise., Disp: 1 Inhaler, Rfl: 1 .  atorvastatin (LIPITOR) 20 MG tablet, Take 20 mg by mouth daily., Disp: , Rfl:  .  Calcium Citrate-Vitamin D (CALCIUM CITRATE + PO), Take 500 mg by mouth 3 (three) times daily., Disp: , Rfl:  .  cetirizine (ZYRTEC ALLERGY) 10 MG tablet, Take 1 tablet (10 mg total) by mouth daily., Disp: 30 tablet, Rfl: 5 .  Cholecalciferol (VITAMIN D-3 PO), Take by mouth daily., Disp: , Rfl:  .  fluticasone (FLONASE) 50 MCG/ACT nasal spray, Place 2 sprays into both nostrils daily. For congestion, Disp: 16 g, Rfl: 5 .  hydrochlorothiazide (HYDRODIURIL) 12.5 MG tablet, Take 1 tablet (12.5 mg total) by mouth daily., Disp: 90 tablet, Rfl: 3 .  lisinopril (PRINIVIL,ZESTRIL) 30 MG tablet, TAKE 1 TABLET BY MOUTH EVERY DAY, Disp: 90 tablet, Rfl: 1 .  meloxicam (MOBIC) 15 MG tablet, Take 15 mg by mouth daily., Disp: , Rfl: 2 .  mometasone-formoterol (DULERA) 200-5 MCG/ACT AERO, Use 2 puffs twice daily to prevent cough or wheeze.  Rinse, gargle, and spit after use., Disp: 1 Inhaler, Rfl: 5 .  montelukast (SINGULAIR) 10 MG tablet, Take 1 tablet (10 mg total)  by mouth at bedtime., Disp: 30 tablet, Rfl: 5 .  Multiple Vitamins-Minerals (MULTIVITAMIN WITH MINERALS) tablet, Take 1 tablet by mouth daily with breakfast. , Disp: , Rfl:  .  Olopatadine HCl (PATANASE) 0.6 % SOLN, Use 1-2 sprays in each nostril twice daily., Disp: 1 Bottle, Rfl: 5 .  pravastatin (PRAVACHOL) 40 MG tablet, pravastatin 40 mg tablet, Disp: , Rfl:  .  predniSONE (STERAPRED UNI-PAK 21 TAB) 10 MG (21) TBPK tablet, , Disp: , Rfl:   Current Facility-Administered Medications:  .  0.9 %  sodium chloride infusion, 500 mL, Intravenous, Once, Nandigam, Venia Minks, MD  EXAM:  There were no vitals filed for this visit.  There is no height or weight on file to calculate BMI.  GENERAL: vitals reviewed and  listed above, alert, oriented, appears well hydrated and in no acute distress  HEENT: atraumatic, conjunttiva clear, no obvious abnormalities on inspection of external nose and ears  NECK: no obvious masses on inspection  LUNGS: clear to auscultation bilaterally, no wheezes, rales or rhonchi, good air movement  CV: HRRR, no peripheral edema  MS: moves all extremities without noticeable abnormality *** PSYCH: pleasant and cooperative, no obvious depression or anxiety  ASSESSMENT AND PLAN:  Discussed the following assessment and plan:  No diagnosis found.  *** -Patient advised to return or notify a doctor immediately if symptoms worsen or persist or new concerns arise.  There are no Patient Instructions on file for this visit.  Lucretia Kern, DO

## 2018-07-25 ENCOUNTER — Ambulatory Visit: Payer: 59 | Admitting: Family Medicine

## 2018-07-25 DIAGNOSIS — Z0289 Encounter for other administrative examinations: Secondary | ICD-10-CM

## 2018-08-02 ENCOUNTER — Other Ambulatory Visit: Payer: Self-pay | Admitting: Family Medicine

## 2018-08-02 ENCOUNTER — Other Ambulatory Visit: Payer: Self-pay | Admitting: *Deleted

## 2018-08-02 ENCOUNTER — Telehealth: Payer: Self-pay | Admitting: *Deleted

## 2018-08-02 MED ORDER — ATORVASTATIN CALCIUM 20 MG PO TABS: 20.0000 mg | ORAL_TABLET | Freq: Every day | ORAL | 1 refills | Status: DC

## 2018-08-02 NOTE — Telephone Encounter (Signed)
Rx done. 

## 2018-08-02 NOTE — Telephone Encounter (Signed)
Dr Maudie Mercury received a surgical clearance form from [facility].  The form was completed and faxed to [phone].  Dr Maudie Mercury stated the pt needs a follow up appt with her and to check on the referral to cardiology.  Unable to leave a message at the pts home or cell number due to the voicemail being full and the pt is scheduled for an appt with cardiology on 08/05/18.  CRM also created.

## 2018-08-05 ENCOUNTER — Ambulatory Visit: Payer: 59 | Admitting: Cardiovascular Disease

## 2018-08-08 ENCOUNTER — Encounter: Payer: Self-pay | Admitting: Cardiovascular Disease

## 2018-08-15 ENCOUNTER — Ambulatory Visit (HOSPITAL_COMMUNITY): Admit: 2018-08-15 | Payer: 59 | Admitting: Orthopedic Surgery

## 2018-08-15 ENCOUNTER — Encounter (HOSPITAL_COMMUNITY): Payer: Self-pay

## 2018-08-15 SURGERY — ARTHROPLASTY, KNEE, TOTAL
Anesthesia: Choice | Laterality: Right

## 2018-11-14 ENCOUNTER — Encounter (HOSPITAL_COMMUNITY): Payer: Self-pay

## 2018-11-14 ENCOUNTER — Ambulatory Visit (HOSPITAL_COMMUNITY): Admit: 2018-11-14 | Payer: 59 | Admitting: Orthopedic Surgery

## 2018-11-14 SURGERY — ARTHROPLASTY, KNEE, TOTAL
Anesthesia: Choice | Laterality: Right

## 2018-11-30 ENCOUNTER — Other Ambulatory Visit: Payer: Self-pay

## 2018-11-30 ENCOUNTER — Ambulatory Visit (INDEPENDENT_AMBULATORY_CARE_PROVIDER_SITE_OTHER): Payer: 59 | Admitting: Allergy

## 2018-11-30 ENCOUNTER — Encounter: Payer: Self-pay | Admitting: Allergy

## 2018-11-30 DIAGNOSIS — J4540 Moderate persistent asthma, uncomplicated: Secondary | ICD-10-CM | POA: Diagnosis not present

## 2018-11-30 DIAGNOSIS — J310 Chronic rhinitis: Secondary | ICD-10-CM

## 2018-11-30 MED ORDER — FLUTICASONE PROPIONATE 50 MCG/ACT NA SUSP: 2.0000 | Freq: Every day | NASAL | 5 refills | Status: DC

## 2018-11-30 MED ORDER — OLOPATADINE HCL 0.6 % NA SOLN: NASAL | 5 refills | Status: DC

## 2018-11-30 MED ORDER — MONTELUKAST SODIUM 10 MG PO TABS: 10.0000 mg | ORAL_TABLET | Freq: Every day | ORAL | 5 refills | Status: DC

## 2018-11-30 NOTE — Progress Notes (Signed)
RE: Rebecca [name] MRN: [id number] DOB: 07-24-1955 Date of Telemedicine Visit: 11/30/2018  Referring provider: Lucretia Kern, DO Primary care provider: Patient, No Pcp Per  Chief Complaint: Asthma (had a flare up a couple of weeks ago, needed a breathing treatment, has cough every now and then) and Allergic Rhinitis    Telemedicine Follow Up Visit via Telephone: I connected with Rebecca [name] for a follow up on 11/30/18 by telephone and verified that I am speaking with the correct person using two identifiers.   I discussed the limitations, risks, security and privacy concerns of performing an evaluation and management service by telephone and the availability of in person appointments. I also discussed with the patient that there may be a patient responsible charge related to this service. The patient expressed understanding and agreed to proceed.  Patient is at home.  Provider is at the office.  Visit start time: 0958 Visit end time: [place] consent/check in by: Anderson Malta T Medical consent and medical assistant/nurse: Lisabeth Pick S  History of Present Illness: She is a 63 y.o. female, who is being followed for asthma, mixed rhinitis. Her previous allergy office visit was on 06/29/18 with Dr. Nelva Bush.   She states she has been doing well since her last visit and has not had any major health changes, surgeries or hospitalizations.  She does report couple weeks ago while gardening she did develop cough and wheezing and used her albuterol with resolution of symptoms.  She believes this is the only time she has needed to use albuterol since last visit.  She has not required any further systemic steroid courses or any ED/UC visits.  She denies nighttime awakenings.  She continues to take Dulera 248mcg 2 puffs twice a day and singulair daily.  She does have Qvar to add on if she has a flare but has not needed to do this.   With her mixed rhinitis she states she has been doing fine.  She is out  of patanase and needs refill.  She also reports using flonase as needed if having nasal congestion.  She does take zyrtec daily with good control.   She states she did start using a natural nose spray that is suppose to boost immune system (silver hydrosol) after attending a homeopathic seminar at her church.   Assessment and Plan: [name] is a 63 y.o. female with:   Asthma, mod persistent - under good control - continue Dulera 200/5 2 puffs twice a day  - during asthma flares or respiratory illnesses add Qvar 57mcg 2 puffs twice a day to your [facility] for added control.  Once symptoms have resolved discontinue use.  - continue Singulair 10mg  daily - continue Albuterol inhaler 2 puffs every 4-6 hours as needed for cough, wheeze, shortness of breath, chest tightness.   Asthma control goals:   Full participation in all desired activities (may need albuterol before activity)  Albuterol use two time or less a week on average (not counting use with activity)  Cough interfering with sleep two time or less a month  Oral steroids no more than once a year  No hospitalizations  Mixed rhinitis - continue Patanase 2 sprays each nostril up to twice daily for nasal drainage as needed - continue Flonase 2 sprays each nostril daily for nasal congestion and use for 1-2 weeks at a time before stopping once symptoms improve.   - continue Zyrtec 10mg  daily as needed - Saline nasal wash each evening in the shower and as  needed.  Follow-up in 6 months or sooner if needed.  Diagnostics: None.  Medication List:  Current Outpatient Medications  Medication Sig Dispense Refill  . albuterol (PROVENTIL) (2.5 MG/3ML) 0.083% nebulizer solution Take 3 mLs (2.5 mg total) by nebulization every 4 (four) hours as needed for wheezing or shortness of breath. 75 mL 1  . albuterol (VENTOLIN HFA) 108 (90 Base) MCG/ACT inhaler Use 2 puffs every 4 hours as needed for cough or wheeze.  May use  2 puffs 10-20 minutes prior  to exercise. 1 Inhaler 1  . atorvastatin (LIPITOR) 20 MG tablet Take 1 tablet (20 mg total) by mouth daily. 90 tablet 1  . Calcium Citrate-Vitamin D (CALCIUM CITRATE + PO) Take 500 mg by mouth 3 (three) times daily.    . cetirizine (ZYRTEC ALLERGY) 10 MG tablet Take 1 tablet (10 mg total) by mouth daily. 30 tablet 5  . Cholecalciferol (VITAMIN D-3 PO) Take by mouth daily.    . fluticasone (FLONASE) 50 MCG/ACT nasal spray Place 2 sprays into both nostrils daily. For congestion 16 g 5  . hydrochlorothiazide (HYDRODIURIL) 12.5 MG tablet Take 1 tablet (12.5 mg total) by mouth daily. 90 tablet 3  . lisinopril (PRINIVIL,ZESTRIL) 30 MG tablet TAKE 1 TABLET BY MOUTH EVERY DAY 90 tablet 1  . mometasone-formoterol (DULERA) 200-5 MCG/ACT AERO Use 2 puffs twice daily to prevent cough or wheeze.  Rinse, gargle, and spit after use. 1 Inhaler 5  . montelukast (SINGULAIR) 10 MG tablet Take 1 tablet (10 mg total) by mouth at bedtime. 30 tablet 5  . Multiple Vitamins-Minerals (MULTIVITAMIN WITH MINERALS) tablet Take 1 tablet by mouth daily with breakfast.     . Olopatadine HCl (PATANASE) 0.6 % SOLN Use 1-2 sprays in each nostril twice daily. 1 Bottle 5  . UNABLE TO FIND Med Name: silver hydrosil nasal spray., for immune support     No current facility-administered medications for this visit.    Allergies: Allergies  Allergen Reactions  . Hydrocodone Hives, Shortness Of Breath and Itching  . Sulfa Antibiotics Hives   I reviewed her past medical history, social history, family history, and environmental history and no significant changes have been reported from previous visit on 06/29/2018.  Review of Systems  Constitutional: Negative for chills and fever.  HENT: Negative for congestion, postnasal drip, rhinorrhea and sneezing.   Eyes: Negative for discharge, redness and itching.  Respiratory: Negative for cough, chest tightness, shortness of breath and wheezing.   Cardiovascular: Negative.    Gastrointestinal: Negative.   Musculoskeletal: Negative for myalgias.  Skin: Negative for rash.  Neurological: Negative for headaches.   Objective: Physical Exam Not obtained as encounter was done via telephone.   Previous notes and tests were reviewed.  I discussed the assessment and treatment plan with the patient. The patient was provided an opportunity to ask questions and all were answered. The patient agreed with the plan and demonstrated an understanding of the instructions.   The patient was advised to call back or seek an in-person evaluation if the symptoms worsen or if the condition fails to improve as anticipated.  I provided 19 minutes of non-face-to-face time during this encounter.  It was my pleasure to participate in [place]'s care today. Please feel free to contact me with any questions or concerns.   Sincerely,  Dazhane Villagomez Charmian Muff, MD

## 2018-11-30 NOTE — Patient Instructions (Addendum)
Asthma - under good control - continue Dulera 200/5 2 puffs twice a day  - during asthma flares or respiratory illnesses add Qvar 40mcg 2 puffs twice a day to your [facility] for added control.  Once symptoms have resolved discontinue use.  - continue Singulair 10mg  daily - continue Albuterol inhaler 2 puffs every 4-6 hours as needed for cough, wheeze, shortness of breath, chest tightness.   Asthma control goals:   Full participation in all desired activities (may need albuterol before activity)  Albuterol use two time or less a week on average (not counting use with activity)  Cough interfering with sleep two time or less a month  Oral steroids no more than once a year  No hospitalizations  Mixed rhinitis - continue Patanase 2 sprays each nostril up to twice daily for nasal drainage as needed - continue Flonase 2 sprays each nostril daily for nasal congestion and use for 1-2 weeks at a time before stopping once symptoms improve.   - continue Zyrtec 10mg  daily as needed - Saline nasal wash each evening in the shower and as needed.  Follow-up in 6 months or sooner if needed.

## 2018-12-10 ENCOUNTER — Other Ambulatory Visit: Payer: Self-pay | Admitting: Family Medicine

## 2018-12-26 ENCOUNTER — Ambulatory Visit: Admit: 2018-12-26 | Payer: 59 | Admitting: Orthopedic Surgery

## 2018-12-26 SURGERY — ARTHROPLASTY, KNEE, TOTAL
Anesthesia: Choice | Laterality: Right

## 2019-01-04 ENCOUNTER — Other Ambulatory Visit: Payer: Self-pay

## 2019-01-04 ENCOUNTER — Encounter: Payer: Self-pay | Admitting: Family Medicine

## 2019-01-04 ENCOUNTER — Ambulatory Visit (INDEPENDENT_AMBULATORY_CARE_PROVIDER_SITE_OTHER): Payer: 59 | Admitting: Family Medicine

## 2019-01-04 DIAGNOSIS — R200 Anesthesia of skin: Secondary | ICD-10-CM

## 2019-01-04 DIAGNOSIS — R202 Paresthesia of skin: Secondary | ICD-10-CM

## 2019-01-04 DIAGNOSIS — J4520 Mild intermittent asthma, uncomplicated: Secondary | ICD-10-CM | POA: Diagnosis not present

## 2019-01-04 DIAGNOSIS — I10 Essential (primary) hypertension: Secondary | ICD-10-CM

## 2019-01-04 DIAGNOSIS — E785 Hyperlipidemia, unspecified: Secondary | ICD-10-CM

## 2019-01-04 NOTE — Progress Notes (Signed)
Virtual Visit via Video Note  I connected with Rebecca [name] on 01/04/19 at  4:30 PM EDT by a video enabled telemedicine application and verified that I am speaking with the correct person using two identifiers.  Location patient: home Location provider:work or home office Persons participating in the virtual visit: patient, provider  I discussed the limitations of evaluation and management by telemedicine and the availability of in person appointments. The patient expressed understanding and agreed to proceed.   HPI: Pt is a 63 yo female with pmh sig for HTN, HLD, OSA, asthma.  Pt seen for f/u and TOC, previously seen by Dr. Maudie Mercury.  HTN: -not checking bp at home -taking lisinopril 30 mg and HCTZ 12.5 mg daily -joined weight watchers -trying to drink 64 oz of water  HLD: -stopped taking lipitor 2/2 myalgias -trying to eat better  Asthma: -states has been "doing pretty good, no recent flare ups" -had to get a breathing treatment in May -triggers included chemicals, dust. -has a nebulizer at home.  Rarely uses. -has an albuterol inhaler  UE concern: -pt mentions arms will "fall asleep" at night -pt is a side sleeper -at times she will wake up with numbness and tingling in her arms  -pt states the feeling goes away after moving the affected arm -pt denies h/o carpal tunnel  ROS: See pertinent positives and negatives per HPI.  Past Medical History:  Diagnosis Date  . Asthma   . Blindness, legal    L EYE  . Hearing loss   . Hyperlipidemia   . Hypertension   . Lapband May 2013 11/18/2011  . Migraines   . Obesity, Class III, BMI 40-49.9 (morbid obesity) ([place]) 06/03/2011  . Sleep apnea    uses c-pap    Past Surgical History:  Procedure Laterality Date  . ABDOMINAL HYSTERECTOMY  2006   fibroids  . BREATH TEK H PYLORI  08/17/2011   Procedure: BREATH TEK H PYLORI;  Surgeon: Pedro Earls, MD;  Location: [facility];  Service: General;  Laterality: N/A;  PATIENT WILL  COME AT 0715  . COLONOSCOPY  2008   @ [facility]   . EYE SURGERY     Patient unsure of surgery date. Left eye  . [place]   right  . LAPAROSCOPIC GASTRIC BANDING  10/20/2011   Procedure: LAPAROSCOPIC GASTRIC BANDING;  Surgeon: Pedro Earls, MD;  Location: [facility];  Service: General;  Laterality: N/A;    Family History  Problem Relation Age of Onset  . Cancer Mother        breast  . Breast cancer Mother   . Alcohol abuse Father   . Heart disease Father 69       MI  . Hypertension Father   . Colon cancer Neg Hx      Current Outpatient Medications:  .  albuterol (PROVENTIL) (2.5 MG/3ML) 0.083% nebulizer solution, Take 3 mLs (2.5 mg total) by nebulization every 4 (four) hours as needed for wheezing or shortness of breath., Disp: 75 mL, Rfl: 1 .  albuterol (VENTOLIN HFA) 108 (90 Base) MCG/ACT inhaler, Use 2 puffs every 4 hours as needed for cough or wheeze.  May use  2 puffs 10-20 minutes prior to exercise., Disp: 1 Inhaler, Rfl: 1 .  atorvastatin (LIPITOR) 20 MG tablet, Take 1 tablet (20 mg total) by mouth daily., Disp: 90 tablet, Rfl: 1 .  Calcium Citrate-Vitamin D (CALCIUM CITRATE + PO), Take 500 mg by mouth 3 (three) times daily.,  Disp: , Rfl:  .  cetirizine (ZYRTEC ALLERGY) 10 MG tablet, Take 1 tablet (10 mg total) by mouth daily., Disp: 30 tablet, Rfl: 5 .  Cholecalciferol (VITAMIN D-3 PO), Take by mouth daily., Disp: , Rfl:  .  fluticasone (FLONASE) 50 MCG/ACT nasal spray, Place 2 sprays into both nostrils daily. For congestion, Disp: 16 g, Rfl: 5 .  hydrochlorothiazide (HYDRODIURIL) 12.5 MG tablet, Take 1 tablet (12.5 mg total) by mouth daily., Disp: 90 tablet, Rfl: 3 .  lisinopril (ZESTRIL) 30 MG tablet, TAKE 1 TABLET BY MOUTH EVERY DAY, Disp: 90 tablet, Rfl: 0 .  mometasone-formoterol (DULERA) 200-5 MCG/ACT AERO, Use 2 puffs twice daily to prevent cough or wheeze.  Rinse, gargle, and spit after use., Disp: 1 Inhaler, Rfl: 5 .  montelukast (SINGULAIR) 10 MG tablet, Take 1  tablet (10 mg total) by mouth at bedtime., Disp: 30 tablet, Rfl: 5 .  Multiple Vitamins-Minerals (MULTIVITAMIN WITH MINERALS) tablet, Take 1 tablet by mouth daily with breakfast. , Disp: , Rfl:  .  Olopatadine HCl (PATANASE) 0.6 % SOLN, Use 1-2 sprays in each nostril twice daily., Disp: 1 Bottle, Rfl: 5 .  UNABLE TO FIND, Med Name: silver hydrosil nasal spray., for immune support, Disp: , Rfl:   EXAM:  VITALS per patient if applicable:  RR between 12-20 bpm  GENERAL: alert, oriented, appears well and in no acute distress  HEENT: atraumatic, conjunctiva clear, no obvious abnormalities on inspection of external nose and ears  NECK: normal movements of the head and neck  LUNGS: on inspection no signs of respiratory distress, breathing rate appears normal, no obvious gross SOB, gasping or wheezing  CV: no obvious cyanosis  MS: moves all visible extremities without noticeable abnormality  PSYCH/NEURO: pleasant and cooperative, no obvious depression or anxiety, speech and thought processing grossly intact  ASSESSMENT AND PLAN:  Discussed the following assessment and plan:  Mild intermittent asthma without complication -Controlled -Discussed avoiding triggers -Use albuterol inhaler as needed  Essential hypertension -Patient encouraged to check BP at home and keep a log to bring her to clinic -Continue current medication including Lexapro 30 mg daily and hydrochlorothiazide 12.5 mg daily. -Discussed some modifications  Hyperlipidemia, unspecified hyperlipidemia type -discussed lifestyle modifications -consider fish oil   Bilateral hand numbness and tingling while sleeping -Discussed with exertion and arms when possible to avoid nerve compression -We will continue to monitor -Patient to notify clinic if symptoms become worse or occur during the day  F/u prn in the next few months   I discussed the assessment and treatment plan with the patient. The patient was provided an  opportunity to ask questions and all were answered. The patient agreed with the plan and demonstrated an understanding of the instructions.   The patient was advised to call back or seek an in-person evaluation if the symptoms worsen or if the condition fails to improve as anticipated.   Billie Ruddy, MD

## 2019-01-30 ENCOUNTER — Other Ambulatory Visit: Payer: Self-pay | Admitting: Family Medicine

## 2019-03-22 ENCOUNTER — Other Ambulatory Visit: Payer: Self-pay | Admitting: Family Medicine

## 2019-04-27 ENCOUNTER — Other Ambulatory Visit: Payer: Self-pay | Admitting: Family Medicine

## 2019-05-08 ENCOUNTER — Telehealth (INDEPENDENT_AMBULATORY_CARE_PROVIDER_SITE_OTHER): Payer: 59 | Admitting: Family Medicine

## 2019-05-08 DIAGNOSIS — M545 Low back pain, unspecified: Secondary | ICD-10-CM

## 2019-05-08 MED ORDER — CYCLOBENZAPRINE HCL 5 MG PO TABS: 5.0000 mg | ORAL_TABLET | Freq: Three times a day (TID) | ORAL | 0 refills | Status: DC | PRN

## 2019-05-08 MED ORDER — MELOXICAM 7.5 MG PO TABS: 7.5000 mg | ORAL_TABLET | Freq: Every day | ORAL | 0 refills | Status: DC

## 2019-05-08 NOTE — Progress Notes (Signed)
Virtual Visit via Video Note  I connected with@ on 05/08/19 at  9:30 AM EST by a video enabled telemedicine application 2/2 [id number] pandemic and verified that I am speaking with the correct person using two identifiers.  Location patient: home Location provider:work or home office Persons participating in the virtual visit: patient, provider  I discussed the limitations of evaluation and management by telemedicine and the availability of in person appointments. The patient expressed understanding and agreed to proceed.   HPI: Pt is a 63 yo female with pmh sig for HTN, OSA, asthma, HLD, and obesity.  Pt states she tried to help her son with special needs out of the bed last Sunday.  Her back started hurting the next day.  Pt denies hearing any pops, clicks, or tears.  Pain is in the R side of lower back, does not radiate.  Discomfort when gets up and moves around.  The pain is a 10/10, sharp, constant "stay right there pain" that stops her in her tracks.  Pt has tried Advil and heat.  Pt denies loss of bowel, bladder, fever, LE edema, LE numbness, tingling or weakness.  ROS: See pertinent positives and negatives per HPI.  Past Medical History:  Diagnosis Date  . Asthma   . Blindness, legal    L EYE  . Hearing loss   . Hyperlipidemia   . Hypertension   . Lapband May 2013 11/18/2011  . Migraines   . Obesity, Class III, BMI 40-49.9 (morbid obesity) ([place]) 06/03/2011  . Sleep apnea    uses c-pap    Past Surgical History:  Procedure Laterality Date  . ABDOMINAL HYSTERECTOMY  2006   fibroids  . BREATH TEK H PYLORI  08/17/2011   Procedure: BREATH TEK H PYLORI;  Surgeon: Pedro Earls, MD;  Location: [facility];  Service: General;  Laterality: N/A;  PATIENT WILL COME AT 0715  . COLONOSCOPY  2008   @ [facility]   . EYE SURGERY     Patient unsure of surgery date. Left eye  . [place]   right  . LAPAROSCOPIC GASTRIC BANDING  10/20/2011   Procedure: LAPAROSCOPIC GASTRIC BANDING;   Surgeon: Pedro Earls, MD;  Location: [facility];  Service: General;  Laterality: N/A;    Family History  Problem Relation Age of Onset  . Cancer Mother        breast  . Breast cancer Mother   . Alcohol abuse Father   . Heart disease Father 33       MI  . Hypertension Father   . Colon cancer Neg Hx       Current Outpatient Medications:  .  albuterol (PROVENTIL) (2.5 MG/3ML) 0.083% nebulizer solution, Take 3 mLs (2.5 mg total) by nebulization every 4 (four) hours as needed for wheezing or shortness of breath., Disp: 75 mL, Rfl: 1 .  albuterol (VENTOLIN HFA) 108 (90 Base) MCG/ACT inhaler, Use 2 puffs every 4 hours as needed for cough or wheeze.  May use  2 puffs 10-20 minutes prior to exercise., Disp: 1 Inhaler, Rfl: 1 .  atorvastatin (LIPITOR) 20 MG tablet, TAKE 1 TABLET BY MOUTH EVERY DAY, Disp: 90 tablet, Rfl: 0 .  Calcium Citrate-Vitamin D (CALCIUM CITRATE + PO), Take 500 mg by mouth 3 (three) times daily., Disp: , Rfl:  .  cetirizine (ZYRTEC ALLERGY) 10 MG tablet, Take 1 tablet (10 mg total) by mouth daily., Disp: 30 tablet, Rfl: 5 .  Cholecalciferol (VITAMIN D-3 PO), Take by  mouth daily., Disp: , Rfl:  .  fluticasone (FLONASE) 50 MCG/ACT nasal spray, Place 2 sprays into both nostrils daily. For congestion, Disp: 16 g, Rfl: 5 .  hydrochlorothiazide (HYDRODIURIL) 12.5 MG tablet, Take 1 tablet (12.5 mg total) by mouth daily., Disp: 90 tablet, Rfl: 3 .  lisinopril (ZESTRIL) 30 MG tablet, TAKE 1 TABLET BY MOUTH EVERY DAY, Disp: 90 tablet, Rfl: 0 .  mometasone-formoterol (DULERA) 200-5 MCG/ACT AERO, Use 2 puffs twice daily to prevent cough or wheeze.  Rinse, gargle, and spit after use., Disp: 1 Inhaler, Rfl: 5 .  montelukast (SINGULAIR) 10 MG tablet, Take 1 tablet (10 mg total) by mouth at bedtime., Disp: 30 tablet, Rfl: 5 .  Multiple Vitamins-Minerals (MULTIVITAMIN WITH MINERALS) tablet, Take 1 tablet by mouth daily with breakfast. , Disp: , Rfl:  .  Olopatadine HCl (PATANASE) 0.6 %  SOLN, Use 1-2 sprays in each nostril twice daily., Disp: 1 Bottle, Rfl: 5 .  UNABLE TO FIND, Med Name: silver hydrosil nasal spray., for immune support, Disp: , Rfl:   EXAM:  VITALS per patient if applicable:  RR between 12-20 bpm  GENERAL: alert, oriented, appears well and in no acute distress  HEENT: atraumatic, conjunctiva clear, no obvious abnormalities on inspection of external nose and ears  NECK: normal movements of the head and neck  LUNGS: on inspection no signs of respiratory distress, breathing rate appears normal, no obvious gross SOB, gasping or wheezing  CV: no obvious cyanosis  MS: moves all visible extremities without noticeable abnormality  PSYCH/NEURO: pleasant and cooperative, no obvious depression or anxiety, speech and thought processing grossly intact  ASSESSMENT AND PLAN:  Discussed the following assessment and plan:  Acute right-sided low back pain without sciatica  -no red flag symptoms -discussed supportive care: heat, massage, stretching -advised may take 4-6 wks for fully improve -given precautions -pt considering seeing a Chiropractor. - Plan: cyclobenzaprine (FLEXERIL) 5 MG tablet, meloxicam (MOBIC) 7.5 MG tablet  F/u prn   I discussed the assessment and treatment plan with the patient. The patient was provided an opportunity to ask questions and all were answered. The patient agreed with the plan and demonstrated an understanding of the instructions.   The patient was advised to call back or seek an in-person evaluation if the symptoms worsen or if the condition fails to improve as anticipated.   Billie Ruddy, MD

## 2019-06-01 ENCOUNTER — Ambulatory Visit: Payer: 59 | Admitting: Allergy

## 2019-06-04 ENCOUNTER — Other Ambulatory Visit: Payer: Self-pay | Admitting: Family Medicine

## 2019-06-04 DIAGNOSIS — M545 Low back pain, unspecified: Secondary | ICD-10-CM

## 2019-07-09 ENCOUNTER — Other Ambulatory Visit: Payer: Self-pay | Admitting: Family Medicine

## 2019-07-24 ENCOUNTER — Telehealth: Payer: Self-pay | Admitting: Pulmonary Disease

## 2019-07-24 NOTE — Telephone Encounter (Signed)
Spoke with pt. She is needing a new CPAP machine. We have not seen her since 2018. Advised her that she would need an OV before we could order her a new CPAP. Pt has been scheduled to see TP on 07/25/19 at 1130. Nothing further was needed.

## 2019-07-25 ENCOUNTER — Other Ambulatory Visit: Payer: Self-pay

## 2019-07-25 ENCOUNTER — Encounter: Payer: Self-pay | Admitting: Adult Health

## 2019-07-25 ENCOUNTER — Ambulatory Visit: Payer: 59 | Admitting: Adult Health

## 2019-07-25 VITALS — BP 128/76 | HR 73 | Temp 97.1°F | Ht 63.0 in | Wt 205.8 lb

## 2019-07-25 DIAGNOSIS — G4733 Obstructive sleep apnea (adult) (pediatric): Secondary | ICD-10-CM

## 2019-07-25 NOTE — Progress Notes (Signed)
Virtual Visit via Telephone Note  I connected with [name] on 07/25/19 at 11:30 AM EST by telephone and verified that I am speaking with the correct person using two identifiers.  Location: Patient: Home  Provider: Office    I discussed the limitations, risks, security and privacy concerns of performing an evaluation and management service by telephone and the availability of in person appointments. I also discussed with the patient that there may be a patient responsible charge related to this service. The patient expressed understanding and agreed to proceed.   History of Present Illness: 64 year old female followed for severe obstructive sleep apnea on CPAP  Today's televisit is a follow-up for sleep apnea.  Patient remains on nocturnal CPAP.  Says that she is doing well on CPAP.  She never misses a night.  She feels rested with no significant daytime sleepiness and feels that she benefits from CPAP .  patient says she is only issues she has been having is dry mouth and nose.  She did recently go to the DME today and get the humidity adjusted on her machine.  She also says her machine is getting old and would like a replacement.  CPAP download shows excellent compliance with 9 hours of daily usage.  She is on CPAP set at 13 cm H2O.  AHI 0.4.  Observations/Objective: Speaks in full sentences.  PSG 09/13/2011 > AHI 59  Assessment and Plan: Severe obstructive sleep apnea with excellent control and compliance on nocturnal CPAP Order for new CPAP machine per patient request May use saline nasal spray for nasal congestion also nasal saline gel  Plan  Patient Instructions  Order for new CPAP .  Continue on CPAP at bedtime Keep up the good work Work on [employer] Do not drive if sleepy Follow up with Dr. Halford Chessman in 1 year and As needed          Follow Up Instructions:   Follow-up in 1 year and as needed  I discussed the assessment and treatment plan with the patient.  The patient was provided an opportunity to ask questions and all were answered. The patient agreed with the plan and demonstrated an understanding of the instructions.   The patient was advised to call back or seek an in-person evaluation if the symptoms worsen or if the condition fails to improve as anticipated.  I provided 21  minutes of non-face-to-face time during this encounter.   Rexene Edison, NP

## 2019-07-25 NOTE — Patient Instructions (Addendum)
Order for new CPAP .  Continue on CPAP at bedtime Keep up the good work Work on [employer] Do not drive if sleepy Follow up with Dr. Halford Chessman in 1 year and As needed

## 2019-07-25 NOTE — Addendum Note (Signed)
Addended by: Parke Poisson E on: 07/25/2019 04:55 PM   Modules accepted: Orders

## 2019-07-25 NOTE — Progress Notes (Signed)
Reviewed and agree with assessment/plan.   Juvia Aerts, MD [facility] 06/10/2016, 12:24 PM Pager:  [phone]  

## 2019-08-16 ENCOUNTER — Other Ambulatory Visit: Payer: Self-pay

## 2019-08-17 ENCOUNTER — Ambulatory Visit (INDEPENDENT_AMBULATORY_CARE_PROVIDER_SITE_OTHER): Payer: 59 | Admitting: Family Medicine

## 2019-08-17 ENCOUNTER — Encounter: Payer: Self-pay | Admitting: Family Medicine

## 2019-08-17 VITALS — BP 126/80 | HR 75 | Temp 97.7°F | Wt 201.0 lb

## 2019-08-17 DIAGNOSIS — Z6835 Body mass index (BMI) 35.0-35.9, adult: Secondary | ICD-10-CM

## 2019-08-17 DIAGNOSIS — Z23 Encounter for immunization: Secondary | ICD-10-CM

## 2019-08-17 DIAGNOSIS — E782 Mixed hyperlipidemia: Secondary | ICD-10-CM | POA: Diagnosis not present

## 2019-08-17 DIAGNOSIS — R5383 Other fatigue: Secondary | ICD-10-CM

## 2019-08-17 DIAGNOSIS — G4733 Obstructive sleep apnea (adult) (pediatric): Secondary | ICD-10-CM

## 2019-08-17 DIAGNOSIS — E6609 Other obesity due to excess calories: Secondary | ICD-10-CM | POA: Diagnosis not present

## 2019-08-17 DIAGNOSIS — Z0000 Encounter for general adult medical examination without abnormal findings: Secondary | ICD-10-CM | POA: Diagnosis not present

## 2019-08-17 LAB — CBC WITH DIFFERENTIAL/PLATELET
Basophils Absolute: 0.1 10*3/uL (ref 0.0–0.1)
Basophils Relative: 0.7 % (ref 0.0–3.0)
Eosinophils Absolute: 0.4 10*3/uL (ref 0.0–0.7)
Eosinophils Relative: 5.1 % — ABNORMAL HIGH (ref 0.0–5.0)
HCT: 37 % (ref 36.0–46.0)
Hemoglobin: 12.5 g/dL (ref 12.0–15.0)
Lymphocytes Relative: 34.4 % (ref 12.0–46.0)
Lymphs Abs: 2.6 10*3/uL (ref 0.7–4.0)
MCHC: 33.8 g/dL (ref 30.0–36.0)
MCV: 93 fl (ref 78.0–100.0)
Monocytes Absolute: 0.6 10*3/uL (ref 0.1–1.0)
Monocytes Relative: 8 % (ref 3.0–12.0)
Neutro Abs: 3.9 10*3/uL (ref 1.4–7.7)
Neutrophils Relative %: 51.8 % (ref 43.0–77.0)
Platelets: 370 10*3/uL (ref 150.0–400.0)
RBC: 3.98 Mil/uL (ref 3.87–5.11)
RDW: 13.4 % (ref 11.5–15.5)
WBC: 7.5 10*3/uL (ref 4.0–10.5)

## 2019-08-17 LAB — LIPID PANEL
Cholesterol: 224 mg/dL — ABNORMAL HIGH (ref 0–200)
HDL: 48.9 mg/dL (ref >39.00)
LDL Cholesterol: 141 mg/dL — ABNORMAL HIGH (ref 0–99)
NonHDL: 175.08
Total CHOL/HDL Ratio: 5
Triglycerides: 171 mg/dL — ABNORMAL HIGH (ref 0.0–149.0)
VLDL: 34.2 mg/dL (ref 0.0–40.0)

## 2019-08-17 LAB — HEMOGLOBIN A1C: Hgb A1c MFr Bld: 5.8 % (ref 4.6–6.5)

## 2019-08-17 LAB — BASIC METABOLIC PANEL
BUN: 19 mg/dL (ref 6–23)
CO2: 30 mEq/L (ref 19–32)
Calcium: 9.9 mg/dL (ref 8.4–10.5)
Chloride: 105 mEq/L (ref 96–112)
Creatinine, Ser: 0.89 mg/dL (ref 0.40–1.20)
GFR: 76.8 mL/min (ref >60.00)
Glucose, Bld: 81 mg/dL (ref 70–99)
Potassium: 3.7 mEq/L (ref 3.5–5.1)
Sodium: 141 mEq/L (ref 135–145)

## 2019-08-17 LAB — TSH: TSH: 1.02 u[IU]/mL (ref 0.35–4.50)

## 2019-08-17 LAB — T4, FREE: Free T4: 0.84 ng/dL (ref 0.60–1.60)

## 2019-08-17 LAB — VITAMIN D 25 HYDROXY (VIT D DEFICIENCY, FRACTURES): VITD: 18.74 ng/mL — ABNORMAL LOW (ref 30.00–100.00)

## 2019-08-17 NOTE — Progress Notes (Signed)
Subjective:     Rebecca [name] is a 64 y.o. female and is here for a comprehensive physical exam. The patient reports problems - weight gain, fatigue.  Pt notes difficulty losing weight.  Being seen by physicians weight management clinic.  States focus declined diet and advised pt that B12 shots weekly.  Labs were not obtained.  Pt had a B12 shot yesterday.  Pt considering rejoining weight watchers instead.  Pt endorses feeling fatigued.  B12 shot helped for a few days but wore off.  Patient got a new CPAP machine.  States no longer at night.  Patient needs to schedule her Pap with her OB.  Mammogram done 2020.  Received second dose of Covid vaccine February 4th.  Social History   Socioeconomic History  . Marital status: Married    Spouse name: Not on file  . Number of children: Not on file  . Years of education: Not on file  . Highest education level: Not on file  Occupational History  . Not on file  Tobacco Use  . Smoking status: Former Smoker    Packs/day: 0.50    Years: 15.00    Pack years: 7.50    Types: Cigarettes    Quit date: 06/02/1994    Years since quitting: 25.2  . Smokeless tobacco: Never Used  Substance and Sexual Activity  . Alcohol use: No  . Drug use: No  . Sexual activity: Not on file  Other Topics Concern  . 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 Difficulty of Paying Living Expenses: Not on file  Food Insecurity:   . Worried About Charity fundraiser in the Last Year: Not on file  . Ran Out of Food in the Last Year: Not on file  Transportation Needs:   . Lack of Transportation (Medical): Not on file  . Lack of  Transportation (Non-Medical): Not on file  Physical Activity:   . Days of Exercise per Week: Not on file  . Minutes of Exercise per Session: Not on file  Stress:   . Feeling of Stress : Not on file  Social Connections:   . Frequency of Communication with Friends and Family: Not on file  . Frequency of Social Gatherings with Friends and Family: Not on file  . Attends Religious Services: Not on file  . Active Member of Clubs or Organizations: Not on file  . Attends Archivist Meetings: Not on file  . Marital Status: Not on file  Intimate Partner Violence:   . Fear of Current or Ex-Partner: Not on file  . Emotionally Abused: Not on file  . Physically Abused: Not on file  . Sexually Abused: Not on file   Health Maintenance  Topic Date Due  . TETANUS/TDAP  11/13/2017  . MAMMOGRAM  02/13/2018  . PNA vac Low Risk Adult (1 of 2 - PCV13) 09/12/2018  . PAP SMEAR-Modifier  10/23/2018  . COLONOSCOPY  08/07/2027  . INFLUENZA VACCINE  Completed  . DEXA SCAN  Completed  . Hepatitis C Screening  Addressed  .  HIV Screening  Addressed    The following portions of the patient's history were reviewed and updated as appropriate: allergies, current medications, past family history, past medical history, past social history, past surgical history and problem list.  Review of Systems Pertinent items noted in HPI and remainder of comprehensive ROS otherwise negative.   Objective:    BP 126/80 (BP Location: Left Arm, Patient Position: Sitting, Cuff Size: Large)   Pulse 75   Temp 97.7 F (36.5 C) (Temporal)   Wt 201 lb (91.2 kg)   SpO2 96%   BMI 35.61 kg/m  General appearance: alert, cooperative and no distress Head: Normocephalic, without obvious abnormality, atraumatic Eyes: conjunctivae/corneas clear. PERRL, EOM's intact. Fundi benign. Ears: normal TM's and external ear canals both ears Nose: Nares normal. Septum midline. Mucosa normal. No drainage or sinus tenderness. Throat:  lips, mucosa, and tongue normal; teeth and gums normal Neck: no adenopathy, no carotid bruit, no JVD, supple, symmetrical, trachea midline and thyroid not enlarged, symmetric, no tenderness/mass/nodules Lungs: clear to auscultation bilaterally Heart: regular rate and rhythm, S1, S2 normal, no murmur, click, rub or gallop Abdomen: soft, non-tender; bowel sounds normal; no masses,  no organomegaly Extremities: extremities normal, atraumatic, no cyanosis or edema Pulses: 2+ and symmetric Skin: Skin color, texture, turgor normal. No rashes or lesions Lymph nodes: Cervical, supraclavicular, and axillary nodes normal. Neurologic: Alert and oriented X 3, normal strength and tone. Normal symmetric reflexes. Normal coordination and gait    Assessment:    Healthy female exam.      Plan:     Anticipatory guidance given including wearing seatbelts, smoke detectors in the home, increasing physical activity, increasing p.o. intake of water and vegetables. -will obtain labs -pt to schedule pap and mammogram -given handout  -next CPE in 1 yr See After Visit Summary for Counseling Recommendations    Fatigue, unspecified type  - Plan: CBC with Differential/Platelet, TSH, T4, Free, Hemoglobin A1c, Vitamin D, 25-hydroxy  OSA (obstructive sleep apnea) -Continue CPAP nightly.  Mixed hyperlipidemia  - Plan: Lipid panel  Need for 23-polyvalent pneumococcal polysaccharide vaccine -PCV 23 given this visit  Class 2 obesity due to excess calories without serious comorbidity with body mass index (BMI) of 35.0 to 35.9 in adult -discussed lifestyle modifications -pt to restart weight watchers. -Plan: TSH, free T4, hemoglobin A1c  F/u prn  Grier Mitts, MD

## 2019-08-17 NOTE — Patient Instructions (Signed)
Preventive Care 38 Years and Older, Female Preventive care refers to lifestyle choices and visits with your health care provider that can promote health and wellness. This includes:  A yearly physical exam. This is also called an annual well check.  Regular dental and eye exams.  Immunizations.  Screening for certain conditions.  Healthy lifestyle choices, such as diet and exercise. What can I expect for my preventive care visit? Physical exam Your health care provider will check:  Height and weight. These may be used to calculate body mass index (BMI), which is a measurement that tells if you are at a healthy weight.  Heart rate and blood pressure.  Your skin for abnormal spots. Counseling Your health care provider may ask you questions about:  Alcohol, tobacco, and drug use.  Emotional well-being.  Home and relationship well-being.  Sexual activity.  Eating habits.  History of falls.  Memory and ability to understand (cognition).  Work and work Statistician.  Pregnancy and menstrual history. What immunizations do I need?  Influenza (flu) vaccine  This is recommended every year. Tetanus, diphtheria, and pertussis (Tdap) vaccine  You may need a Td booster every 10 years. Varicella (chickenpox) vaccine  You may need this vaccine if you have not already been vaccinated. Zoster (shingles) vaccine  You may need this after age 33. Pneumococcal conjugate (PCV13) vaccine  One dose is recommended after age 33. Pneumococcal polysaccharide (PPSV23) vaccine  One dose is recommended after age 72. Measles, mumps, and rubella (MMR) vaccine  You may need at least one dose of MMR if you were born in 1957 or later. You may also need a second dose. Meningococcal conjugate (MenACWY) vaccine  You may need this if you have certain conditions. Hepatitis A vaccine  You may need this if you have certain conditions or if you travel or work in places where you may be exposed  to hepatitis A. Hepatitis B vaccine  You may need this if you have certain conditions or if you travel or work in places where you may be exposed to hepatitis B. Haemophilus influenzae type b (Hib) vaccine  You may need this if you have certain conditions. You may receive vaccines as individual doses or as more than one vaccine together in one shot (combination vaccines). Talk with your health care provider about the risks and benefits of combination vaccines. What tests do I need? Blood tests  Lipid and cholesterol levels. These may be checked every 5 years, or more frequently depending on your overall health.  Hepatitis C test.  Hepatitis B test. Screening  Lung cancer screening. You may have this screening every year starting at age 39 if you have a 30-pack-year history of smoking and currently smoke or have quit within the past 15 years.  Colorectal cancer screening. All adults should have this screening starting at age 36 and continuing until age 15. Your health care provider may recommend screening at age 23 if you are at increased risk. You will have tests every 1-10 years, depending on your results and the type of screening test.  Diabetes screening. This is done by checking your blood sugar (glucose) after you have not eaten for a while (fasting). You may have this done every 1-3 years.  Mammogram. This may be done every 1-2 years. Talk with your health care provider about how often you should have regular mammograms.  BRCA-related cancer screening. This may be done if you have a family history of breast, ovarian, tubal, or peritoneal cancers.  Other tests  Sexually transmitted disease (STD) testing.  Bone density scan. This is done to screen for osteoporosis. You may have this done starting at age 28. Follow these instructions at home: Eating and drinking  Eat a diet that includes fresh fruits and vegetables, whole grains, lean protein, and low-fat dairy products. Limit  your intake of foods with high amounts of sugar, saturated fats, and salt.  Take vitamin and mineral supplements as recommended by your health care provider.  Do not drink alcohol if your health care provider tells you not to drink.  If you drink alcohol: ? Limit how much you have to 0-1 drink a day. ? Be aware of how much alcohol is in your drink. In the [place] one drink equals one 12 oz bottle of beer (355 mL), one 5 oz glass of wine (148 mL), or one 1 oz glass of hard liquor (44 mL). Lifestyle  Take daily care of your teeth and gums.  Stay active. Exercise for at least 30 minutes on 5 or more days each week.  Do not use any products that contain nicotine or tobacco, such as cigarettes, e-cigarettes, and chewing tobacco. If you need help quitting, ask your health care provider.  If you are sexually active, practice safe sex. Use a condom or other form of protection in order to prevent STIs (sexually transmitted infections).  Talk with your health care provider about taking a low-dose aspirin or statin. What's next?  Go to your health care provider once a year for a well check visit.  Ask your health care provider how often you should have your eyes and teeth checked.  Stay up to date on all vaccines. This information is not intended to replace advice given to you by your health care provider. Make sure you discuss any questions you have with your health care provider. Document Revised: 05/26/2018 Document Reviewed: 05/26/2018 Elsevier Patient Education  [place].  Fatigue If you have fatigue, you feel tired all the time and have a lack of energy or a lack of motivation. Fatigue may make it difficult to start or complete tasks because of exhaustion. In general, occasional or mild fatigue is often a normal response to activity or life. However, long-lasting (chronic) or extreme fatigue may be a symptom of a medical condition. Follow these instructions at home: General  instructions  Watch your fatigue for any changes.  Go to bed and get up at the same time every day.  Avoid fatigue by pacing yourself during the day and getting enough sleep at night.  Maintain a healthy weight. Medicines  Take over-the-counter and prescription medicines only as told by your health care provider.  Take a multivitamin, if told by your health care provider.  Do not use herbal or dietary supplements unless they are approved by your health care provider. Activity   Exercise regularly, as told by your health care provider.  Use or practice techniques to help you relax, such as yoga, tai chi, meditation, or massage therapy. Eating and drinking   Avoid heavy meals in the evening.  Eat a well-balanced diet, which includes lean proteins, whole grains, plenty of fruits and vegetables, and low-fat dairy products.  Avoid consuming too much caffeine.  Avoid the use of alcohol.  Drink enough fluid to keep your urine pale yellow. Lifestyle  Change situations that cause you stress. Try to keep your work and personal schedule in balance.  Do not use any products that contain nicotine or tobacco,  such as cigarettes and e-cigarettes. If you need help quitting, ask your health care provider.  Do not use drugs. Contact a health care provider if:  Your fatigue does not get better.  You have a fever.  You suddenly lose or gain weight.  You have headaches.  You have trouble falling asleep or sleeping through the night.  You feel angry, guilty, anxious, or sad.  You are unable to have a bowel movement (constipation).  Your skin is dry.  You have swelling in your legs or another part of your body. Get help right away if:  You feel confused.  Your vision is blurry.  You feel faint or you pass out.  You have a severe headache.  You have severe pain in your abdomen, your back, or the area between your waist and hips (pelvis).  You have chest pain,  shortness of breath, or an irregular or fast heartbeat.  You are unable to urinate, or you urinate less than normal.  You have abnormal bleeding, such as bleeding from the rectum, vagina, nose, lungs, or nipples.  You vomit blood.  You have thoughts about hurting yourself or others. If you ever feel like you may hurt yourself or others, or have thoughts about taking your own life, get help right away. You can go to your nearest emergency department or call:  Your local emergency services (911 in the [place]  A suicide crisis helpline, such as the [facility] at 240-476-2183. This is open 24 hours a day. Summary  If you have fatigue, you feel tired all the time and have a lack of energy or a lack of motivation.  Fatigue may make it difficult to start or complete tasks because of exhaustion.  Long-lasting (chronic) or extreme fatigue may be a symptom of a medical condition.  Exercise regularly, as told by your health care provider.  Change situations that cause you stress. Try to keep your work and personal schedule in balance. This information is not intended to replace advice given to you by your health care provider. Make sure you discuss any questions you have with your health care provider. Document Revised: 12/21/2018 Document Reviewed: 02/24/2017 Elsevier Patient Education  2020 [employer].

## 2019-08-18 ENCOUNTER — Other Ambulatory Visit: Payer: Self-pay | Admitting: Family Medicine

## 2019-08-18 DIAGNOSIS — E559 Vitamin D deficiency, unspecified: Secondary | ICD-10-CM

## 2019-08-18 MED ORDER — VITAMIN D (ERGOCALCIFEROL) 1.25 MG (50000 UNIT) PO CAPS: 50000.0000 [IU] | ORAL_CAPSULE | ORAL | 0 refills | Status: DC

## 2019-08-22 ENCOUNTER — Other Ambulatory Visit: Payer: Self-pay | Admitting: Allergy

## 2019-08-22 DIAGNOSIS — J310 Chronic rhinitis: Secondary | ICD-10-CM

## 2019-08-22 DIAGNOSIS — J4540 Moderate persistent asthma, uncomplicated: Secondary | ICD-10-CM

## 2019-08-25 ENCOUNTER — Telehealth: Payer: Self-pay

## 2019-08-25 NOTE — Telephone Encounter (Signed)
Spoke with pt regarding her Medical Evaluation Rebecca [name], advised pt that the form has been completed and is ready for pick up, pt state that she will come by the office today to pick up form. Pt form placed by the front office cabinet.

## 2019-09-14 ENCOUNTER — Other Ambulatory Visit: Payer: Self-pay | Admitting: Family Medicine

## 2019-10-03 ENCOUNTER — Other Ambulatory Visit: Payer: Self-pay | Admitting: Family Medicine

## 2019-10-03 DIAGNOSIS — E559 Vitamin D deficiency, unspecified: Secondary | ICD-10-CM

## 2019-11-15 ENCOUNTER — Ambulatory Visit: Payer: 59 | Admitting: Allergy

## 2019-11-15 ENCOUNTER — Encounter: Payer: Self-pay | Admitting: Allergy

## 2019-11-15 ENCOUNTER — Other Ambulatory Visit: Payer: Self-pay

## 2019-11-15 ENCOUNTER — Telehealth: Payer: Self-pay | Admitting: Allergy

## 2019-11-15 VITALS — BP 126/82 | HR 87 | Temp 97.7°F | Resp 16 | Ht 60.3 in | Wt 207.0 lb

## 2019-11-15 DIAGNOSIS — J4541 Moderate persistent asthma with (acute) exacerbation: Secondary | ICD-10-CM | POA: Diagnosis not present

## 2019-11-15 DIAGNOSIS — J0110 Acute frontal sinusitis, unspecified: Secondary | ICD-10-CM | POA: Diagnosis not present

## 2019-11-15 DIAGNOSIS — J310 Chronic rhinitis: Secondary | ICD-10-CM | POA: Diagnosis not present

## 2019-11-15 MED ORDER — ALBUTEROL SULFATE HFA 108 (90 BASE) MCG/ACT IN AERS: INHALATION_SPRAY | RESPIRATORY_TRACT | 1 refills | Status: DC

## 2019-11-15 MED ORDER — MONTELUKAST SODIUM 10 MG PO TABS: ORAL_TABLET | ORAL | 1 refills | Status: DC

## 2019-11-15 MED ORDER — MOMETASONE FURO-FORMOTEROL FUM 200-5 MCG/ACT IN AERO: INHALATION_SPRAY | RESPIRATORY_TRACT | 1 refills | Status: DC

## 2019-11-15 MED ORDER — ALBUTEROL SULFATE (2.5 MG/3ML) 0.083% IN NEBU: 2.5000 mg | INHALATION_SOLUTION | RESPIRATORY_TRACT | 1 refills | Status: DC | PRN

## 2019-11-15 MED ORDER — CETIRIZINE HCL 10 MG PO TABS: 10.0000 mg | ORAL_TABLET | Freq: Every day | ORAL | 5 refills | Status: DC

## 2019-11-15 MED ORDER — AMOXICILLIN-POT CLAVULANATE 875-125 MG PO TABS: ORAL_TABLET | ORAL | 0 refills | Status: DC

## 2019-11-15 MED ORDER — OLOPATADINE HCL 0.6 % NA SOLN: NASAL | 5 refills | Status: DC

## 2019-11-15 MED ORDER — FLUTICASONE PROPIONATE 50 MCG/ACT NA SUSP: 2.0000 | Freq: Every day | NASAL | 5 refills | Status: DC

## 2019-11-15 NOTE — Patient Instructions (Signed)
Sinus infection - current symptoms are consistent with a sinus infection and will treat as such with Augmentin 875mg  1 tab twice a day x 10 days and prednisone pack as directed for next 5 days - Covid test was negative - stay well hydrated - can take Mucinex 1 tab twice a day with plenty of water to help loosen mucus  Asthma - current flare-up with sinus infection - continue Dulera 200/5 2 puffs twice a day  - continue Singulair 10mg  daily - continue Albuterol inhaler 2 puffs every 4-6 hours as needed for cough, wheeze, shortness of breath, chest tightness.  - take prednisone as above  Asthma control goals:   Full participation in all desired activities (may need albuterol before activity)  Albuterol use two time or less a week on average (not counting use with activity)  Cough interfering with sleep two time or less a month  Oral steroids no more than once a year  No hospitalizations  Mixed rhinitis - continue Patanase 2 sprays each nostril up to twice daily for nasal drainage as needed - continue Flonase 2 sprays each nostril daily for nasal congestion and use for 1-2 weeks at a time before stopping once symptoms improve.   - continue Zyrtec 10mg  daily as needed - Saline nasal wash each evening in the shower and as needed.  Follow-up in 4-6 months or sooner if needed.

## 2019-11-15 NOTE — Progress Notes (Signed)
Follow-up Note  RE: Rebecca [name] MRN: [id number] DOB: Dec 08, 1955 Date of Office Visit: 11/15/2019   History of present illness: Rebecca [name] is a 64 y.o. female presenting today for sick visit.  She has history of asthma and mixed rhinitis.  She had a telemedicine visit last on 11/30/2018 by myself. She went to the beach over weekend and returned Sunday.  Since returning home she has been having "terrible" frontal HA, sneezing, coughing, body ache, nasal drainage.  No fever.  She has needed to use her albuterol 3-4 times a day for cough and shortness of breath when she moves around since returning.  she is taking her Dulera and signulair as directed.  Also using both nose sprays (patanase and flonase) as well as daily zyrtec.  She denies any other family members with sick symptoms.   Due to her symptoms she did get tested for Covid yesterday and was negative.   Review of systems: Review of Systems  Constitutional: Negative.   HENT:       See HPI  Eyes: Negative.   Respiratory:       See HPI  Cardiovascular: Negative.   Gastrointestinal: Negative.   Musculoskeletal: Positive for myalgias.  Skin: Negative.   Neurological: Positive for headaches.    All other systems negative unless noted above in HPI  Past medical/social/surgical/family history have been reviewed and are unchanged unless specifically indicated below.  No changes  Medication List: Current Outpatient Medications  Medication Sig Dispense Refill  . albuterol (PROVENTIL) (2.5 MG/3ML) 0.083% nebulizer solution Take 3 mLs (2.5 mg total) by nebulization every 4 (four) hours as needed for wheezing or shortness of breath. 75 mL 1  . albuterol (VENTOLIN HFA) 108 (90 Base) MCG/ACT inhaler Use 2 puffs every 4 hours as needed for cough or wheeze.  May use  2 puffs 10-20 minutes prior to exercise. 18 g 1  . atorvastatin (LIPITOR) 20 MG tablet TAKE 1 TABLET BY MOUTH EVERY DAY 90 tablet 0  . Calcium Citrate-Vitamin D  (CALCIUM CITRATE + PO) Take 500 mg by mouth 3 (three) times daily.    . cetirizine (ZYRTEC ALLERGY) 10 MG tablet Take 1 tablet (10 mg total) by mouth daily. 30 tablet 5  . Cholecalciferol (VITAMIN D-3 PO) Take by mouth daily.    . cyclobenzaprine (FLEXERIL) 5 MG tablet Take 1 tablet (5 mg total) by mouth 3 (three) times daily as needed for muscle spasms. 30 tablet 0  . fluticasone (FLONASE) 50 MCG/ACT nasal spray Place 2 sprays into both nostrils daily. For congestion 16 g 5  . hydrochlorothiazide (HYDRODIURIL) 12.5 MG tablet TAKE 1 TABLET BY MOUTH EVERY DAY 90 tablet 1  . lisinopril (ZESTRIL) 30 MG tablet TAKE 1 TABLET BY MOUTH EVERY DAY 90 tablet 0  . meloxicam (MOBIC) 7.5 MG tablet TAKE 1 TABLET BY MOUTH EVERY DAY 30 tablet 0  . mometasone-formoterol (DULERA) 200-5 MCG/ACT AERO Use 2 puffs twice daily to prevent cough or wheeze.  Rinse, gargle, and spit after use. 13 g 1  . montelukast (SINGULAIR) 10 MG tablet TAKE 1 TABLET BY MOUTH EVERYDAY AT BEDTIME 90 tablet 1  . Multiple Vitamins-Minerals (MULTIVITAMIN WITH MINERALS) tablet Take 1 tablet by mouth daily with breakfast.     . Olopatadine HCl (PATANASE) 0.6 % SOLN Use 1-2 sprays in each nostril twice daily. 30.5 g 5  . UNABLE TO FIND Med Name: silver hydrosil nasal spray., for immune support    . Vitamin D, Ergocalciferol, (DRISDOL)  1.25 MG (50000 UNIT) CAPS capsule Take 1 capsule (50,000 Units total) by mouth every 7 (seven) days. 12 capsule 0  . amoxicillin-clavulanate (AUGMENTIN) 875-125 MG tablet 1 tablet twice a day for 10 days only 20 tablet 0   No current facility-administered medications for this visit.     Known medication allergies: Allergies  Allergen Reactions  . Hydrocodone Hives, Shortness Of Breath and Itching  . Sulfa Antibiotics Hives     Physical examination: Blood pressure 126/82, pulse 87, temperature 97.7 F (36.5 C), temperature source Temporal, resp. rate 16, height 5' 0.3" (1.532 m), weight 207 lb (93.9 kg),  SpO2 97 %.  General: Alert, interactive, in no acute distress, mouth breathing. HEENT: PERRLA, TMs pearly gray, turbinates moderately edematous without discharge, post-pharynx non erythematous. Neck: Supple without lymphadenopathy, TTP over forehead. Lungs: Clear to auscultation without wheezing, rhonchi or rales. {no increased work of breathing. CV: Normal S1, S2 without murmurs. Abdomen: Nondistended, nontender. Skin: Warm and dry, without lesions or rashes. Extremities:  No clubbing, cyanosis or edema. Neuro:   Grossly intact.  Diagnositics/Labs:  Spirometry: FEV1: 1.36L 89%, FVC: 1.75L 95%, ratio consistent with nonbostructive pattern   Assessment and plan:   Sinus infection, acute - current symptoms are consistent with a sinus infection and will treat as such with Augmentin 875mg  1 tab twice a day x 10 days and prednisone pack as directed for next 5 days - Covid test was negative - stay well hydrated - can take Mucinex 1 tab twice a day with plenty of water to help loosen mucus  Asthma, mod persistent - current flare-up with sinus infection - continue Dulera 200/5 2 puffs twice a day  - continue Singulair 10mg  daily - continue Albuterol inhaler 2 puffs every 4-6 hours as needed for cough, wheeze, shortness of breath, chest tightness.  - take prednisone as above  Asthma control goals:   Full participation in all desired activities (may need albuterol before activity)  Albuterol use two time or less a week on average (not counting use with activity)  Cough interfering with sleep two time or less a month  Oral steroids no more than once a year  No hospitalizations  Mixed rhinitis - continue Patanase 2 sprays each nostril up to twice daily for nasal drainage as needed - continue Flonase 2 sprays each nostril daily for nasal congestion and use for 1-2 weeks at a time before stopping once symptoms improve.   - continue Zyrtec 10mg  daily as needed - Saline nasal wash  each evening in the shower and as needed.  Follow-up in 4-6 months or sooner if needed.  I appreciate the opportunity to take part in Rebecca [name]'s care. Please do not hesitate to contact me with questions.  Sincerely,   Prudy Feeler, MD Allergy/Immunology [facility]

## 2019-11-16 ENCOUNTER — Telehealth: Payer: Self-pay | Admitting: Allergy

## 2019-11-16 NOTE — Telephone Encounter (Signed)
Called and spoke with patient. She was confused about the cetrizine. She said she did not remember anyone discussing that particular medication with her during her appointment and wanted to make sure it was safe for her to take along with her antibiotics. I let her know that it is perfectly fine to take with her other medications. I also gave her the instructions for the cetrizine. Patient voiced understanding and said she would call back if any further questions.

## 2019-11-16 NOTE — Telephone Encounter (Signed)
Please call patient.  She called on 6/2 asking for clarification about medications. I attempted to call her back and left vmail to call us back.

## 2019-11-17 ENCOUNTER — Telehealth: Payer: Self-pay

## 2019-11-17 NOTE — Telephone Encounter (Signed)
Pa submitted thru cover my meds for dulera 200

## 2019-11-20 NOTE — Telephone Encounter (Signed)
Received fax from [facility] that [facility] PA was denied. States it will only be covered if pt has contraindication/intolerance or inadequate treatment response to all alternatives: Advair Diskus, Advair HFA, Breo Ellipta, Symbicort. Requirement: trial and failure of 3 or more in a class with at least 3 alteratives, 2 in a class with 2 alternatives, or 1 in a class with only 1 alternative. Appeal claims: Prescription Claim Appeals [place] [address] [place] [address]

## 2019-11-21 MED ORDER — BUDESONIDE-FORMOTEROL FUMARATE 160-4.5 MCG/ACT IN AERO: 2.0000 | INHALATION_SPRAY | Freq: Two times a day (BID) | RESPIRATORY_TRACT | 5 refills | Status: DC

## 2019-11-21 NOTE — Telephone Encounter (Signed)
Contacted patient and advise her that we will be [facility]. Patient made aware and will try alternative.

## 2019-11-21 NOTE — Telephone Encounter (Signed)
This is so frustrating.  Please let pt know that we are needing to change her long-standing medication because her of her insurance.  Well let's to Symbicort 172mcg  2 puffs twice a day.    Tell patient if she does not feel this is controlling her symptoms to let us know and we can document failure and change.  She will need to try and fail 3 inhaler options in order to get [facility] covered.

## 2019-11-21 NOTE — Addendum Note (Signed)
Addended by: Valere Dross on: 11/21/2019 06:32 PM   Modules accepted: Orders

## 2019-12-06 ENCOUNTER — Other Ambulatory Visit: Payer: Self-pay

## 2019-12-07 ENCOUNTER — Encounter: Payer: Self-pay | Admitting: Family Medicine

## 2019-12-07 ENCOUNTER — Telehealth: Payer: 59 | Admitting: Family Medicine

## 2019-12-07 NOTE — Progress Notes (Signed)
Attempted to connect with pt on video visit, however she did not join the call at 1 pm.  Several attempts made.

## 2019-12-27 ENCOUNTER — Other Ambulatory Visit: Payer: Self-pay

## 2019-12-27 ENCOUNTER — Encounter: Payer: Self-pay | Admitting: Family Medicine

## 2019-12-27 ENCOUNTER — Ambulatory Visit: Payer: 59 | Admitting: Family Medicine

## 2019-12-27 VITALS — BP 140/80 | HR 75 | Temp 98.6°F | Ht 60.36 in | Wt 207.0 lb

## 2019-12-27 DIAGNOSIS — R202 Paresthesia of skin: Secondary | ICD-10-CM | POA: Diagnosis not present

## 2019-12-27 DIAGNOSIS — F439 Reaction to severe stress, unspecified: Secondary | ICD-10-CM

## 2019-12-27 DIAGNOSIS — R079 Chest pain, unspecified: Secondary | ICD-10-CM | POA: Diagnosis not present

## 2019-12-27 NOTE — Patient Instructions (Signed)
Paresthesia Paresthesia is an abnormal burning or prickling sensation. It is usually felt in the hands, arms, legs, or feet. However, it may occur in any part of the body. Usually, paresthesia is not painful. It may feel like:  Tingling or numbness.  Buzzing.  Itching. Paresthesia may occur without any clear cause, or it may be caused by:  Breathing too quickly (hyperventilation).  Pressure on a nerve.  An underlying medical condition.  Side effects of a medication.  Nutritional deficiencies.  Exposure to toxic chemicals. Most people experience temporary (transient) paresthesia at some time in their lives. For some people, it may be long-lasting (chronic) because of an underlying medical condition. If you have paresthesia that lasts a long time, you may need to be evaluated by your health care provider. Follow these instructions at home: Alcohol use   Do not drink alcohol if: ? Your health care provider tells you not to drink. ? You are pregnant, may be pregnant, or are planning to become pregnant.  If you drink alcohol: ? Limit how much you use to:  0-1 drink a day for women.  0-2 drinks a day for men. ? Be aware of how much alcohol is in your drink. In the [place] one drink equals one 12 oz bottle of beer (355 mL), one 5 oz glass of wine (148 mL), or one 1 oz glass of hard liquor (44 mL). Nutrition   Eat a healthy diet. This includes: ? Eating foods that are high in fiber, such as fresh fruits and vegetables, whole grains, and beans. ? Limiting foods that are high in fat and processed sugars, such as fried or sweet foods. General instructions  Take over-the-counter and prescription medicines only as told by your health care provider.  Do not use any products that contain nicotine or tobacco, such as cigarettes and e-cigarettes. These can keep blood from reaching damaged nerves. If you need help quitting, ask your health care provider.  If you have diabetes, work  closely with your health care provider to keep your blood sugar under control.  If you have numbness in your feet: ? Check every day for signs of injury or infection. Watch for redness, warmth, and swelling. ? Wear padded socks and comfortable shoes. These help protect your feet.  Keep all follow-up visits as told by your health care provider. This is important. Contact a health care provider if you:  Have paresthesia that gets worse or does not go away.  Have a burning or prickling feeling that gets worse when you walk.  Have pain, cramps, or dizziness.  Develop a rash. Get help right away if you:  Feel weak.  Have trouble walking or moving.  Have problems with speech, understanding, or vision.  Feel confused.  Cannot control your bladder or bowel movements.  Have numbness after an injury.  Develop new weakness in an arm or leg.  Faint. Summary  Paresthesia is an abnormal burning or prickling sensation that is usually felt in the hands, arms, legs, or feet. It may also occur in other parts of the body.  Paresthesia may occur without any clear cause, or it may be caused by breathing too quickly (hyperventilation), pressure on a nerve, an underlying medical condition, side effects of a medication, nutritional deficiencies, or exposure to toxic chemicals.  If you have paresthesia that lasts a long time, you may need to be evaluated by your health care provider. This information is not intended to replace advice given to you by  your health care provider. Make sure you discuss any questions you have with your health care provider. Document Revised: 06/27/2018 Document Reviewed: 06/10/2017 Elsevier Patient Education  [place].  Nonspecific Chest Pain, Adult Chest pain can be caused by many different conditions. It can be caused by a condition that is life-threatening and requires treatment right away. It can also be caused by something that is not life-threatening.  If you have chest pain, it can be hard to know the difference, so it is important to get help right away to make sure that you do not have a serious condition. Some life-threatening causes of chest pain include:  Heart attack.  A tear in the body's main blood vessel (aortic dissection).  Inflammation around your heart (pericarditis).  A problem in the lungs, such as a blood clot (pulmonary embolism) or a collapsed lung (pneumothorax). Some non life-threatening causes of chest pain include:  Heartburn.  Anxiety or stress.  Damage to the bones, muscles, and cartilage that make up your chest wall.  Pneumonia or bronchitis.  Shingles infection (varicella-zoster virus). Chest pain can feel like:  Pain or discomfort on the surface of your chest or deep in your chest.  Crushing, pressure, aching, or squeezing pain.  Burning or tingling.  Dull or sharp pain that is worse when you move, cough, or take a deep breath.  Pain or discomfort that is also felt in your back, neck, jaw, shoulder, or arm, or pain that spreads to any of these areas. Your chest pain may come and go. It may also be constant. Your health care provider will do lab tests and other studies to find the cause of your pain. Treatment will depend on the cause of your chest pain. Follow these instructions at home: Medicines  Take over-the-counter and prescription medicines only as told by your health care provider.  If you were prescribed an antibiotic, take it as told by your health care provider. Do not stop taking the antibiotic even if you start to feel better. Lifestyle   Rest as directed by your health care provider.  Do not use any products that contain nicotine or tobacco, such as cigarettes and e-cigarettes. If you need help quitting, ask your health care provider.  Do not drink alcohol.  Make healthy lifestyle choices as recommended. These may include: ? Getting regular exercise. Ask your health care  provider to suggest some activities that are safe for you. ? Eating a heart-healthy diet. This includes plenty of fresh fruits and vegetables, whole grains, low-fat (lean) protein, and low-fat dairy products. A dietitian can help you find healthy eating options. ? Maintaining a healthy weight. ? Managing any other health conditions you have, such as high blood pressure (hypertension) or diabetes. ? Reducing stress, such as with yoga or relaxation techniques. General instructions  Pay attention to any changes in your symptoms. Tell your health care provider about them or any new symptoms.  Avoid any activities that cause chest pain.  Keep all follow-up visits as told by your health care provider. This is important. This includes visits for any further testing if your chest pain does not go away. Contact a health care provider if:  Your chest pain does not go away.  You feel depressed.  You have a fever. Get help right away if:  Your chest pain gets worse.  You have a cough that gets worse, or you cough up blood.  You have severe pain in your abdomen.  You faint.  You have sudden, unexplained chest discomfort.  You have sudden, unexplained discomfort in your arms, back, neck, or jaw.  You have shortness of breath at any time.  You suddenly start to sweat, or your skin gets clammy.  You feel nausea or you vomit.  You suddenly feel lightheaded or dizzy.  You have severe weakness, or unexplained weakness or fatigue.  Your heart begins to beat quickly, or it feels like it is skipping beats. These symptoms may represent a serious problem that is an emergency. Do not wait to see if the symptoms will go away. Get medical help right away. Call your local emergency services (911 in the [place] Do not drive yourself to the hospital. Summary  Chest pain can be caused by a condition that is serious and requires urgent treatment. It may also be caused by something that is not  life-threatening.  If you have chest pain, it is very important to see your health care provider. Your health care provider may do lab tests and other studies to find the cause of your pain.  Follow your health care provider's instructions on taking medicines, making lifestyle changes, and getting emergency treatment if symptoms become worse.  Keep all follow-up visits as told by your health care provider. This includes visits for any further testing if your chest pain does not go away. This information is not intended to replace advice given to you by your health care provider. Make sure you discuss any questions you have with your health care provider. Document Revised: 12/02/2017 Document Reviewed: 12/02/2017 Elsevier Patient Education  2020 [place], Adult Stress is a normal reaction to life events. Stress is what you feel when life demands more than you are used to, or more than you think you can handle. Some stress can be useful, such as studying for a test or meeting a deadline at work. Stress that occurs too often or for too long can cause problems. It can affect your emotional health and interfere with relationships and normal daily activities. Too much stress can weaken your body's defense system (immune system) and increase your risk for physical illness. If you already have a medical problem, stress can make it worse. What are the causes? All sorts of life events can cause stress. An event that causes stress for one person may not be stressful for another person. Major life events, whether positive or negative, commonly cause stress. Examples include:  Losing a job or starting a new job.  Losing a loved one.  Moving to a new town or home.  Getting married or divorced.  Having a baby.  Getting injured or sick. Less obvious life events can also cause stress, especially if they occur day after day or in combination with each other. Examples include:  Working long  hours.  Driving in traffic.  Caring for children.  Being in debt.  Being in a difficult relationship. What are the signs or symptoms? Stress can cause emotional symptoms, including:  Anxiety. This is feeling worried, afraid, on edge, overwhelmed, or out of control.  Anger, including irritation or impatience.  Depression. This is feeling sad, down, helpless, or guilty.  Trouble focusing, remembering, or making decisions. Stress can cause physical symptoms, including:  Aches and pains. These may affect your head, neck, back, stomach, or other areas of your body.  Tight muscles or a clenched jaw.  Low energy.  Trouble sleeping. Stress can cause unhealthy behaviors, including:  Eating to feel better (overeating) or  skipping meals.  Working too much or putting off tasks.  Smoking, drinking alcohol, or using drugs to feel better. How is this diagnosed? Stress is diagnosed through an assessment by your health care provider. He or she may diagnose this condition based on:  Your symptoms and any stressful life events.  Your medical history.  Tests to rule out other causes of your symptoms. Depending on your condition, your health care provider may refer you to a specialist for further evaluation. How is this treated?  Stress management techniques are the recommended treatment for stress. Medicine is not typically recommended for the treatment of stress. Techniques to reduce your reaction to stressful life events include:  Stress identification. Monitor yourself for symptoms of stress and identify what causes stress for you. These skills may help you to avoid or prepare for stressful events.  Time management. Set your priorities, keep a calendar of events, and learn to say no. Taking these actions can help you avoid making too many commitments. Techniques for coping with stress include:  Rethinking the problem. Try to think realistically about stressful events rather than  ignoring them or overreacting. Try to find the positives in a stressful situation rather than focusing on the negatives.  Exercise. Physical exercise can release both physical and emotional tension. The key is to find a form of exercise that you enjoy and do it regularly.  Relaxation techniques. These relax the body and mind. The key is to find one or more that you enjoy and use the techniques regularly. Examples include: ? Meditation, deep breathing, or progressive relaxation techniques. ? Yoga or tai chi. ? Biofeedback, mindfulness techniques, or journaling. ? Listening to music, being out in nature, or participating in other hobbies.  Practicing a healthy lifestyle. Eat a balanced diet, drink plenty of water, limit or avoid caffeine, and get plenty of sleep.  Having a strong support network. Spend time with family, friends, or other people you enjoy being around. Express your feelings and talk things over with someone you trust. Counseling or talk therapy with a mental health professional may be helpful if you are having trouble managing stress on your own. Follow these instructions at home: Lifestyle   Avoid drugs.  Do not use any products that contain nicotine or tobacco, such as cigarettes, e-cigarettes, and chewing tobacco. If you need help quitting, ask your health care provider.  Limit alcohol intake to no more than 1 drink a day for nonpregnant women and 2 drinks a day for men. One drink equals 12 oz of beer, 5 oz of wine, or 1 oz of hard liquor  Do not use alcohol or drugs to relax.  Eat a balanced diet that includes fresh fruits and vegetables, whole grains, lean meats, fish, eggs, and beans, and low-fat dairy. Avoid processed foods and foods high in added fat, sugar, and salt.  Exercise at least 30 minutes on 5 or more days each week.  Get 7-8 hours of sleep each night. General instructions   Practice stress management techniques as discussed with your health care  provider.  Drink enough fluid to keep your urine clear or pale yellow.  Take over-the-counter and prescription medicines only as told by your health care provider.  Keep all follow-up visits as told by your health care provider. This is important. Contact a health care provider if:  Your symptoms get worse.  You have new symptoms.  You feel overwhelmed by your problems and can no longer manage them on your own.  Get help right away if:  You have thoughts of hurting yourself or others. If you ever feel like you may hurt yourself or others, or have thoughts about taking your own life, get help right away. You can go to your nearest emergency department or call:  Your local emergency services (911 in the [place]  A suicide crisis helpline, such as the [facility] at 515-114-1670. This is open 24 hours a day. Summary  Stress is a normal reaction to life events. It can cause problems if it happens too often or for too long.  Practicing stress management techniques is the best way to treat stress.  Counseling or talk therapy with a mental health professional may be helpful if you are having trouble managing stress on your own. This information is not intended to replace advice given to you by your health care provider. Make sure you discuss any questions you have with your health care provider. Document Revised: 12/30/2018 Document Reviewed: 07/22/2016 Elsevier Patient Education  [place].

## 2019-12-28 ENCOUNTER — Other Ambulatory Visit: Payer: Self-pay | Admitting: Family Medicine

## 2019-12-28 ENCOUNTER — Telehealth: Payer: Self-pay | Admitting: Family Medicine

## 2019-12-28 DIAGNOSIS — E559 Vitamin D deficiency, unspecified: Secondary | ICD-10-CM

## 2019-12-28 LAB — COMPREHENSIVE METABOLIC PANEL
AG Ratio: 1.9 (calc) (ref 1.0–2.5)
ALT: 22 U/L (ref 6–29)
AST: 20 U/L (ref 10–35)
Albumin: 4.3 g/dL (ref 3.6–5.1)
Alkaline phosphatase (APISO): 63 U/L (ref 37–153)
BUN: 16 mg/dL (ref 7–25)
CO2: 25 mmol/L (ref 20–32)
Calcium: 9.7 mg/dL (ref 8.6–10.4)
Chloride: 106 mmol/L (ref 98–110)
Creat: 0.79 mg/dL (ref 0.50–0.99)
Globulin: 2.3 g/dL (calc) (ref 1.9–3.7)
Glucose, Bld: 76 mg/dL (ref 65–99)
Potassium: 3.9 mmol/L (ref 3.5–5.3)
Sodium: 141 mmol/L (ref 135–146)
Total Bilirubin: 0.2 mg/dL (ref 0.2–1.2)
Total Protein: 6.6 g/dL (ref 6.1–8.1)

## 2019-12-28 LAB — HEMOGLOBIN A1C
Hgb A1c MFr Bld: 5.9 % of total Hgb — ABNORMAL HIGH (ref <5.7)
Mean Plasma Glucose: 123 (calc)
eAG (mmol/L): 6.8 (calc)

## 2019-12-28 LAB — CBC WITH DIFFERENTIAL/PLATELET
Absolute Monocytes: 570 cells/uL (ref 200–950)
Basophils Absolute: 47 cells/uL (ref 0–200)
Basophils Relative: 0.7 %
Eosinophils Absolute: 328 cells/uL (ref 15–500)
Eosinophils Relative: 4.9 %
HCT: 37.7 % (ref 35.0–45.0)
Hemoglobin: 12.5 g/dL (ref 11.7–15.5)
Lymphs Abs: 2425 cells/uL (ref 850–3900)
MCH: 30.5 pg (ref 27.0–33.0)
MCHC: 33.2 g/dL (ref 32.0–36.0)
MCV: 92 fL (ref 80.0–100.0)
MPV: 9.4 fL (ref 7.5–12.5)
Monocytes Relative: 8.5 %
Neutro Abs: 3330 cells/uL (ref 1500–7800)
Neutrophils Relative %: 49.7 %
Platelets: 394 10*3/uL (ref 140–400)
RBC: 4.1 10*6/uL (ref 3.80–5.10)
RDW: 12.9 % (ref 11.0–15.0)
Total Lymphocyte: 36.2 %
WBC: 6.7 10*3/uL (ref 3.8–10.8)

## 2019-12-28 LAB — FOLATE: Folate: 12.4 ng/mL

## 2019-12-28 LAB — T4, FREE: Free T4: 1.1 ng/dL (ref 0.8–1.8)

## 2019-12-28 LAB — MAGNESIUM: Magnesium: 2 mg/dL (ref 1.5–2.5)

## 2019-12-28 LAB — VITAMIN D 25 HYDROXY (VIT D DEFICIENCY, FRACTURES): Vit D, 25-Hydroxy: 17 ng/mL — ABNORMAL LOW (ref 30–100)

## 2019-12-28 LAB — LIPASE: Lipase: 55 U/L (ref 7–60)

## 2019-12-28 LAB — VITAMIN B12: Vitamin B-12: 785 pg/mL (ref 200–1100)

## 2019-12-28 LAB — TSH: TSH: 1.01 mIU/L (ref 0.40–4.50)

## 2019-12-28 NOTE — Telephone Encounter (Signed)
The patient received the lab results on MyChart and doesn't understand the results and would like for someone to call her.  Please advise

## 2019-12-29 ENCOUNTER — Other Ambulatory Visit: Payer: Self-pay | Admitting: Family Medicine

## 2019-12-29 ENCOUNTER — Encounter: Payer: Self-pay | Admitting: Family Medicine

## 2019-12-29 DIAGNOSIS — E559 Vitamin D deficiency, unspecified: Secondary | ICD-10-CM | POA: Insufficient documentation

## 2019-12-29 DIAGNOSIS — R7303 Prediabetes: Secondary | ICD-10-CM | POA: Insufficient documentation

## 2019-12-29 MED ORDER — VITAMIN D (ERGOCALCIFEROL) 1.25 MG (50000 UNIT) PO CAPS: 50000.0000 [IU] | ORAL_CAPSULE | ORAL | 0 refills | Status: DC

## 2019-12-29 NOTE — Telephone Encounter (Signed)
Pt notified that pt on Mychart receives the labs first sometimes before the provider has a chance to result them. Pt informed that when Dr.Banks result the labs we will call her with the results. Pt verbalized understanding.

## 2019-12-31 ENCOUNTER — Other Ambulatory Visit: Payer: Self-pay | Admitting: Family Medicine

## 2020-01-02 ENCOUNTER — Encounter: Payer: Self-pay | Admitting: Family Medicine

## 2020-01-02 NOTE — Progress Notes (Signed)
Subjective:    Patient ID: Rebecca [name], female    DOB: 09-Aug-1955, 64 y.o.   MRN: [id number]  Chief Complaint  Patient presents with  . Pain    chest, leg and back pain. sx 3wks   . Numbness    numbness and tingling in feet mostly in arms    HPI Patient was seen today for ongoing concern.  Pt endorses L upper chest pain x 2 wks, noted as sharp, shooting, also occurred in back.  Pt with fatigue, numbness in arms, and burning in toes.  Pt wearing CPAP at night.  Pt endorses some increased stress.  Past Medical History:  Diagnosis Date  . Asthma   . Blindness, legal    L EYE  . Hearing loss   . Hyperlipidemia   . Hypertension   . Lapband May 2013 11/18/2011  . Migraines   . Obesity, Class III, BMI 40-49.9 (morbid obesity) ([place]) 06/03/2011  . Sleep apnea    uses c-pap    Allergies  Allergen Reactions  . Hydrocodone Hives, Shortness Of Breath and Itching  . Sulfa Antibiotics Hives    ROS General: Denies fever, chills, night sweats, changes in weight, changes in appetite  +fatigue HEENT: Denies headaches, ear pain, changes in vision, rhinorrhea, sore throat CV: Denies palpitations, SOB, orthopnea  +CP Pulm: Denies SOB, cough, wheezing GI: Denies abdominal pain, nausea, vomiting, diarrhea, constipation GU: Denies dysuria, hematuria, frequency, vaginal discharge Msk: Denies muscle cramps, joint pains  +back pain Neuro: Denies weakness, tingling  +numbness, burning Skin: Denies rashes, bruising Psych: Denies depression, anxiety, hallucinations  Objective:    Blood pressure 140/80, pulse 75, temperature 98.6 F (37 C), temperature source Other (Comment), height 5' 0.36" (1.533 m), weight 207 lb (93.9 kg), SpO2 97 %.   Gen. Pleasant, well-nourished, in no distress, normal affect  HEENT: [place]/AT, face symmetric, no scleral icterus, PERRLA, EOMI, nares patent without drainage Lungs: no accessory muscle use, CTAB, no wheezes or rales Cardiovascular: RRR, no m/r/g, no  peripheral edema Musculoskeletal: No deformities, no cyanosis or clubbing, normal tone Neuro:  A&Ox3, CN II-XII intact, normal gait Skin:  Warm, no lesions/ rash   Wt Readings from Last 3 Encounters:  12/27/19 207 lb (93.9 kg)  12/07/19 207 lb (93.9 kg)  11/15/19 207 lb (93.9 kg)    Lab Results  Component Value Date   WBC 6.7 12/27/2019   HGB 12.5 12/27/2019   HCT 37.7 12/27/2019   PLT 394 12/27/2019   GLUCOSE 76 12/27/2019   CHOL 224 (H) 08/17/2019   TRIG 171.0 (H) 08/17/2019   HDL 48.90 08/17/2019   LDLDIRECT 147.4 01/11/2013   LDLCALC 141 (H) 08/17/2019   ALT 22 12/27/2019   AST 20 12/27/2019   NA 141 12/27/2019   K 3.9 12/27/2019   CL 106 12/27/2019   CREATININE 0.79 12/27/2019   BUN 16 12/27/2019   CO2 25 12/27/2019   TSH 1.01 12/27/2019   HGBA1C 5.9 (H) 12/27/2019    Assessment/Plan:  Chest pain, unspecified type  -discussed possible causes including anxiety, GERD, MSK, cardiac, pulmonary -EKG with NSR.  Compared to previous EKG from 06/24/2011 -consider CXR.  Unavailable in clinic, but can be obtained at neighboring clinic.   - Plan: EKG 12-Lead, CBC with Differential/Platelet, CMP, Lipase, Magnesium, T4, Free, TSH  Paresthesia  -discussed possible causes including vitamin deficiencies, thyroid dysfunction, neuropathy - Plan: Hemoglobin A1c, CMP, Folate, Magnesium, T4, Free, TSH, Vitamin B12, Vitamin D, 25-hydroxy  Stress -PHQ 9 score 4 -GAD  7 score 2 -discussed was to reduce stress -consider counseling -will continue to monitor  F/u prn  Grier Mitts, MD

## 2020-01-02 NOTE — Telephone Encounter (Signed)
Please see result notes.  

## 2020-01-22 ENCOUNTER — Other Ambulatory Visit: Payer: Self-pay | Admitting: Obstetrics and Gynecology

## 2020-01-22 DIAGNOSIS — R928 Other abnormal and inconclusive findings on diagnostic imaging of breast: Secondary | ICD-10-CM

## 2020-02-02 ENCOUNTER — Ambulatory Visit
Admission: RE | Admit: 2020-02-02 | Discharge: 2020-02-02 | Disposition: A | Discharge status: Home / Self Care | Payer: 59 | Source: Ambulatory Visit | Attending: Obstetrics and Gynecology | Admitting: Obstetrics and Gynecology

## 2020-02-02 ENCOUNTER — Other Ambulatory Visit: Payer: Self-pay

## 2020-02-02 DIAGNOSIS — R928 Other abnormal and inconclusive findings on diagnostic imaging of breast: Secondary | ICD-10-CM

## 2020-03-19 ENCOUNTER — Other Ambulatory Visit: Payer: Self-pay | Admitting: Family Medicine

## 2020-03-19 DIAGNOSIS — E559 Vitamin D deficiency, unspecified: Secondary | ICD-10-CM

## 2020-04-05 ENCOUNTER — Other Ambulatory Visit: Payer: Self-pay

## 2020-04-05 ENCOUNTER — Ambulatory Visit: Payer: 59 | Admitting: Family Medicine

## 2020-04-05 ENCOUNTER — Encounter: Payer: Self-pay | Admitting: Family Medicine

## 2020-04-05 VITALS — BP 148/96 | HR 110 | Temp 103.0°F | Wt 198.6 lb

## 2020-04-05 DIAGNOSIS — R350 Frequency of micturition: Secondary | ICD-10-CM | POA: Diagnosis not present

## 2020-04-05 DIAGNOSIS — R519 Headache, unspecified: Secondary | ICD-10-CM

## 2020-04-05 DIAGNOSIS — N3001 Acute cystitis with hematuria: Secondary | ICD-10-CM | POA: Diagnosis not present

## 2020-04-05 DIAGNOSIS — R059 Cough, unspecified: Secondary | ICD-10-CM | POA: Diagnosis not present

## 2020-04-05 DIAGNOSIS — R509 Fever, unspecified: Secondary | ICD-10-CM | POA: Diagnosis not present

## 2020-04-05 DIAGNOSIS — Z7189 Other specified counseling: Secondary | ICD-10-CM

## 2020-04-05 DIAGNOSIS — R000 Tachycardia, unspecified: Secondary | ICD-10-CM

## 2020-04-05 LAB — POCT URINALYSIS DIPSTICK
Bilirubin, UA: NEGATIVE
Glucose, UA: NEGATIVE
Ketones, UA: NEGATIVE
Nitrite, UA: NEGATIVE
Protein, UA: POSITIVE — AB
Spec Grav, UA: 1.015 (ref 1.010–1.025)
Urobilinogen, UA: 0.2 E.U./dL
pH, UA: 6.5 (ref 5.0–8.0)

## 2020-04-05 MED ORDER — CEFTRIAXONE SODIUM 250 MG IJ SOLR
250.0000 mg | Freq: Once | INTRAMUSCULAR | Status: AC
  Administered 2020-04-05: 250 mg via INTRAMUSCULAR

## 2020-04-05 MED ORDER — CIPROFLOXACIN HCL 500 MG PO TABS: 500.0000 mg | ORAL_TABLET | Freq: Two times a day (BID) | ORAL | 0 refills | Status: AC

## 2020-04-05 NOTE — Progress Notes (Signed)
Subjective:    Patient ID: Rebecca [name], female    DOB: [birth year], 64 y.o.   MRN: [id number]  No chief complaint on file.   HPI Pt is a 64 year old female with past medical history significant for HTN, migraines, asthma, OSA, hearing loss, vitamin D deficiency, prediabetes, obesity, HLD, legally blind in left eye who was seen for acute concern.  Pt endorses urinary frequency, urgency, leakage, suprapubic pressure, body aches, fever, cough, chills, headache x 5 days.  Pt started drinking 8 bottles of water per day over the last few weeks in an effort to lose weight.  Pt taking Advil for her symptoms.  Patient has completed 2 doses of Pfizer vaccine and booster.  Past Medical History:  Diagnosis Date  . Asthma   . Blindness, legal    L EYE  . Hearing loss   . Hyperlipidemia   . Hypertension   . Lapband May 2013 11/18/2011  . Migraines   . Obesity, Class III, BMI 40-49.9 (morbid obesity) ([place]) 06/03/2011  . Sleep apnea    uses c-pap    Allergies  Allergen Reactions  . Hydrocodone Hives, Shortness Of Breath and Itching  . Sulfa Antibiotics Hives    ROS General: Denies night sweats, changes in weight, changes in appetite  + fever, chills HEENT: Denies ear pain, changes in vision, rhinorrhea, sore throat  + headache CV: Denies CP, palpitations, SOB, orthopnea Pulm: Denies SOB,  wheezing  + cough GI: Denies abdominal pain, nausea, vomiting, diarrhea, constipation  + suprapubic pain GU: Denies dysuria, hematuria, vaginal discharge  + urinary frequency, urgency, leakage Msk: Denies muscle cramps, joint pains Neuro: Denies weakness, numbness, tingling Skin: Denies rashes, bruising Psych: Denies depression, anxiety, hallucinations    Objective:    Blood pressure (!) 148/96, pulse (!) 110, temperature (!) 103 F (39.4 C), temperature source Oral, weight 198 lb 9.6 oz (90.1 kg), SpO2 97 %.  Gen. Pleasant, well-nourished, in no distress, normal affect   HEENT: [place]/AT, face  symmetric, conjunctiva clear, no scleral icterus, PERRLA, EOMI, nares patent without drainage. Lungs: no accessory muscle use, CTAB, no wheezes or rales Cardiovascular: Tachycardia, no m/r/g, no peripheral edema Abdomen: BS present, soft, NT/ND, no hepatosplenomegaly.  No CVA tenderness. Musculoskeletal: No deformities, no cyanosis or clubbing, normal tone Neuro:  A&Ox3, CN II-XII intact, normal gait Skin:  Warm, no lesions/ rash   Wt Readings from Last 3 Encounters:  12/27/19 207 lb (93.9 kg)  12/07/19 207 lb (93.9 kg)  11/15/19 207 lb (93.9 kg)    Lab Results  Component Value Date   WBC 6.7 12/27/2019   HGB 12.5 12/27/2019   HCT 37.7 12/27/2019   PLT 394 12/27/2019   GLUCOSE 76 12/27/2019   CHOL 224 (H) 08/17/2019   TRIG 171.0 (H) 08/17/2019   HDL 48.90 08/17/2019   LDLDIRECT 147.4 01/11/2013   LDLCALC 141 (H) 08/17/2019   ALT 22 12/27/2019   AST 20 12/27/2019   NA 141 12/27/2019   K 3.9 12/27/2019   CL 106 12/27/2019   CREATININE 0.79 12/27/2019   BUN 16 12/27/2019   CO2 25 12/27/2019   TSH 1.01 12/27/2019   HGBA1C 5.9 (H) 12/27/2019    Assessment/Plan:  Acute cystitis with hematuria -UA with signs of infection -Due to symptoms of complicated UTI in clinic pt given dose of Rocephin IM -Will start Cipro 500 mg twice daily -Urine sent for culture -Patient given handout and precautions  - Plan: cefTRIAXone (ROCEPHIN) injection 250 mg, ciprofloxacin (CIPRO) 500 MG  tablet  Urinary frequency  - Plan: POCT urinalysis dipstick, Urine Culture  Fever and chills -Symptoms concerning for complicated UTI, pyelonephritis, or other concomitant infection -We will obtain viral panel as patient is presently including -Starting Cipro for UTI.  Given dose of Rocephin in clinic -Continue supportive care at home including NSAIDs, rest, hydration -Given strict precautions - Plan: COVID-19, Flu A+B and RSV  Cough -Continue supportive care - Plan: COVID-19, Flu A+B and  RSV  Acute nonintractable headache, unspecified headache type -Tylenol - Plan: COVID-19, Flu A+B and RSV  Educated about COVID-19 virus infection -Discussed signs and symptoms of COVID-19 virus infection -Patient advised to fill quarantine -Given handout  Tachycardia -Likely 2/2 ongoing infection -Discussed hydration -We will start antibiotics for UTI -We will obtain Covid testing along with influenza and RSV -Given prescription precautions  F/u prn  Grier Mitts, MD

## 2020-04-05 NOTE — Patient Instructions (Signed)

## 2020-04-06 LAB — COVID-19, FLU A+B AND RSV
Influenza A, NAA: NOT DETECTED
Influenza B, NAA: NOT DETECTED
RSV, NAA: NOT DETECTED
SARS-CoV-2, NAA: NOT DETECTED

## 2020-04-07 LAB — URINE CULTURE
MICRO NUMBER:: 11107431
SPECIMEN QUALITY:: ADEQUATE

## 2020-04-11 ENCOUNTER — Encounter: Payer: Self-pay | Admitting: Family Medicine

## 2020-04-29 ENCOUNTER — Other Ambulatory Visit: Payer: Self-pay | Admitting: Family Medicine

## 2020-05-06 ENCOUNTER — Other Ambulatory Visit: Payer: Self-pay | Admitting: Family Medicine

## 2020-05-06 ENCOUNTER — Other Ambulatory Visit: Payer: Self-pay

## 2020-05-06 DIAGNOSIS — N3001 Acute cystitis with hematuria: Secondary | ICD-10-CM

## 2020-05-06 NOTE — Telephone Encounter (Signed)
Spoke with pt state that she does not need any refill on any Antibiotic or any Rx for now.

## 2020-08-19 ENCOUNTER — Ambulatory Visit: Payer: 59 | Admitting: Family Medicine

## 2020-08-19 ENCOUNTER — Encounter: Payer: Self-pay | Admitting: Family Medicine

## 2020-08-19 ENCOUNTER — Other Ambulatory Visit: Payer: Self-pay

## 2020-08-19 VITALS — BP 145/98 | HR 86 | Temp 98.7°F | Wt 206.6 lb

## 2020-08-19 DIAGNOSIS — I10 Essential (primary) hypertension: Secondary | ICD-10-CM

## 2020-08-19 DIAGNOSIS — M7051 Other bursitis of knee, right knee: Secondary | ICD-10-CM | POA: Diagnosis not present

## 2020-08-19 DIAGNOSIS — M5440 Lumbago with sciatica, unspecified side: Secondary | ICD-10-CM | POA: Diagnosis not present

## 2020-08-19 MED ORDER — CYCLOBENZAPRINE HCL 5 MG PO TABS: 5.0000 mg | ORAL_TABLET | Freq: Every evening | ORAL | 0 refills | Status: DC | PRN

## 2020-08-19 NOTE — Progress Notes (Signed)
Subjective:    Patient ID: Rebecca [name], female    DOB: Jun 14, 1956, 65 y.o.   MRN: [id number]  Chief Complaint  Patient presents with  . Leg Pain    Right leg pain from knee up to lower back    HPI Patient was seen today for acute concern.  Patient endorses right leg pain x4 days.  States sensation started in the right knee prior to stepping out of bed.  Pain/discomfort noted as an aching throb that goes up thigh to lower back, burns at times.  Pain worse at night, with getting up from sitting.  Patient tried Advil, Tylenol, heat without relief.  Occasionally notes instability in right knee.  Knee replacement was recommended prior to pandemic.  Past Medical History:  Diagnosis Date  . Asthma   . Blindness, legal    L EYE  . Hearing loss   . Hyperlipidemia   . Hypertension   . Lapband May 2013 11/18/2011  . Migraines   . Obesity, Class III, BMI 40-49.9 (morbid obesity) ([place]) 06/03/2011  . Sleep apnea    uses c-pap    Allergies  Allergen Reactions  . Hydrocodone Hives, Shortness Of Breath and Itching  . Sulfa Antibiotics Hives    ROS General: Denies fever, chills, night sweats, changes in weight, changes in appetite HEENT: Denies headaches, ear pain, changes in vision, rhinorrhea, sore throat CV: Denies CP, palpitations, SOB, orthopnea Pulm: Denies SOB, cough, wheezing GI: Denies abdominal pain, nausea, vomiting, diarrhea, constipation GU: Denies dysuria, hematuria, frequency, vaginal discharge Msk: Denies muscle cramps, joint pains  +Low back pain, R knee pain Neuro: Denies weakness, numbness, tingling Skin: Denies rashes, bruising Psych: Denies depression, anxiety, hallucinations     Objective:    Blood pressure (!) 145/98, pulse 86, temperature 98.7 F (37.1 C), temperature source Oral, weight 206 lb 9.6 oz (93.7 kg), SpO2 95 %.  Gen. Pleasant, well-nourished, in no distress, normal affect   HEENT: [place]/AT, face symmetric, conjunctiva clear, no scleral icterus,  PERRLA, EOMI, nares patent without drainage, pharynx without erythema or exudate. Neck: No JVD, no thyromegaly, no carotid bruits Lungs: no accessory muscle use, CTAB, no wheezes or rales Cardiovascular: RRR, no m/r/g, no peripheral edema Abdomen: BS present, soft, NT/ND, no hepatosplenomegaly. Musculoskeletal: TTP of lumbosacral spine midline.  No TTP of cervical, thoracic.  TTP of right lateral knee, no crepitus, edema, or erythema.  No deformities, no cyanosis or clubbing, normal tone Neuro:  A&Ox3, CN II-XII intact, normal gait Skin:  Warm, no lesions/ rash   Wt Readings from Last 3 Encounters:  08/19/20 206 lb 9.6 oz (93.7 kg)  04/05/20 198 lb 9.6 oz (90.1 kg)  12/27/19 207 lb (93.9 kg)    Lab Results  Component Value Date   WBC 6.7 12/27/2019   HGB 12.5 12/27/2019   HCT 37.7 12/27/2019   PLT 394 12/27/2019   GLUCOSE 76 12/27/2019   CHOL 224 (H) 08/17/2019   TRIG 171.0 (H) 08/17/2019   HDL 48.90 08/17/2019   LDLDIRECT 147.4 01/11/2013   LDLCALC 141 (H) 08/17/2019   ALT 22 12/27/2019   AST 20 12/27/2019   NA 141 12/27/2019   K 3.9 12/27/2019   CL 106 12/27/2019   CREATININE 0.79 12/27/2019   BUN 16 12/27/2019   CO2 25 12/27/2019   TSH 1.01 12/27/2019   HGBA1C 5.9 (H) 12/27/2019    Assessment/Plan:  Back pain of lumbosacral region with sciatica -Discussed supportive care, ice, heat, stretching, massage, NSAIDs Tylenol as needed -Start  Flexeril as needed nightly -For continued or worsening symptoms consider PT and imaging -Given handout  Bursitis of other bursa of right knee -Iliotibial bursitis -Discussed supportive care including topical analgesics, rest, heat, ice -Given handout  Essential hypertension -Elevated -Possibly 2/2 pain -Continue current meds HCTZ 12.5 mg, lisinopril 30 mg -For continued elevation will make medication adjustments  F/u as needed in the next few weeks  Grier Mitts, MD

## 2020-08-19 NOTE — Patient Instructions (Addendum)
Iliotibial Bursitis  Iliotibial bursitis is inflammation of the bursa on the outside of the knee. A bursa is a fluid-filled sac that is often found near a joint. Bursas act as cushions to help tendons glide smoothly over bony surfaces during joint movement. The iliotibial bursa is located beneath a long tendon (iliotibial band) that connects muscles of the buttock, hip, and upper leg to the outside of the shin bone. This condition is also called iliotibial band friction syndrome. What are the causes? This condition is caused by:  Repeated rubbing of the tendon over the bursa, which occurs when you do activities over and over. This friction causes fluid to build up inside the bursa.  The buildup of fluid inside the bursa causes it to swell. The swollen bursa causes pain in the area where it is located. What increases the risk? The following factors may make you more likely to develop this condition:  Doing athletic activities that involve repetitive squatting, running, cutting, and side-to-side movements.  Overtraining, or starting a new athletic activity without gradually increasing your time and distance.  Participating in certain sports, such as: ? Basketball. ? Cross-country running. ? Football. ? Rugby. ? Racquet sports. ? Soccer. ? Volleyball. ? Cycling.  Being 25-36 years old and having knee arthritis.  Being a middle-aged woman who is overweight.  Having flat feet or knee deformities.  Having diabetes. What are the signs or symptoms? Symptoms of this condition include:  Pain on the outside of your knee. The pain may also be felt on the outside of your leg near the knee.  Tenderness when pressing on the side of your knee.  Knee swelling that may or may not include increased warmth or redness.  Pain that gets worse with activity such as: ? Kneeling. ? Walking down the stairs. ? Prolonged walking or running. How is this diagnosed? This condition is usually  diagnosed based on your symptoms, your medical history, and a physical exam. During the exam, your health care provider will check your:  Knee motion.  Knee strength.  Amount of pain when the outside of your knee is touched or pressed on.  Ability to do activities such as walking or climbing stairs. Rarely, other tests may be done to rule out other causes of your symptoms. These tests may include:  MRI.  Ultrasound. How is this treated? Treatment for this condition may include:  Avoiding activities that cause pain and swelling.  Icing your knee.  Applying heat to your knee.  Wearing an elastic wrap or sleeve to support your knee.  Keeping your knee raised (elevated) when resting.  Taking medicine to reduce pain and swelling.  Having an injection of numbing medicine or anti-inflammatory medicine (steroid) into the bursa to see if the pain will go away.  Doing stretching and strengthening exercises. Treatment usually improves the pain in 6-8 weeks. Surgery is sometimes needed to drain or remove the bursa. Follow these instructions at home: If you have a compression wrap or sleeve:  Wear it as told by your health care provider. Remove it only as told by your health care provider.  Loosen the wrap or sleeve if your foot or toes tingle, become numb, or turn cold and blue.  Keep the wrap or sleeve clean.  If the wrap or sleeve is not waterproof: ? Do not let it get wet. ? Cover it with a watertight covering when you take a bath or shower. Managing pain, stiffness, and swelling  If directed, put  ice on the knee. ? If you have a removable wrap or sleeve, remove it as told by your health care provider. ? Put ice in a plastic bag. ? Place a towel between your skin and the bag. ? Leave the ice on for 20 minutes, 2-3 times a day.  Move your toes often to avoid stiffness and to lessen swelling.  Raise (elevate) your leg above the level of your heart while you are sitting or  lying down.  If directed, apply heat to the affected area. Use the heat source that your health care provider recommends, such as a moist heat pack or a heating pad. ? Place a towel between your skin and the heat source. ? Leave the heat on for 20-30 minutes. ? Remove the heat if your skin turns bright red. This is especially important if you are unable to feel pain, heat, or cold. You may have a greater risk of getting burned.      Activity  Return to your normal activities as told by your health care provider. Ask your health care provider what activities are safe for you.  Do exercises as told by your health care provider. General instructions  Take over-the-counter and prescription medicines only as told by your health care provider.  Keep all follow-up visits as told by your health care provider. This is important. How is this prevented?  Warm up and stretch before being active.  Cool down and stretch after being active.  Give your body time to rest between periods of activity.  Make sure to use equipment that fits you.  Maintain physical fitness, including: ? Strength. ? Flexibility. Contact a health care provider if:  You have pain that is not relieved by rest or treatment.  Your symptoms get worse or do not improve with home care. Summary  Iliotibial bursitis is inflammation of the bursa on the outside of the knee.  Symptoms of this condition include pain on the outside of the knee that gets worse with activity.  Treatment includes resting the knee, ice, compression, and sometimes pain medicines. This information is not intended to replace advice given to you by your health care provider. Make sure you discuss any questions you have with your health care provider. Document Revised: 09/22/2018 Document Reviewed: 11/17/2017 Elsevier Patient Education  2021 [place] Ask your health care provider which exercises are safe for you. Do  exercises exactly as told by your health care provider and adjust them as directed. It is normal to feel mild stretching, pulling, tightness, or discomfort as you do these exercises. Stop right away if you feel sudden pain or your pain gets worse. Do not begin these exercises until told by your health care provider. Stretching and range-of-motion exercises These exercises warm up your muscles and joints and improve the movement and flexibility of your leg. These exercises also help to relieve pain and stiffness. Quadriceps stretch, prone 1. Lie on your abdomen (prone position) on a firm surface, such as a bed or padded floor. 2. Bend your left / right knee and reach back to hold your ankle or pant leg. If you cannot reach your ankle or pant leg, loop a belt around your foot and grab the belt instead. 3. Gently pull your heel toward your buttocks. Your knee should not slide out to the side. You should feel a stretch in the front of your thigh and knee (quadriceps). 4. Hold this position for __________ seconds. Repeat __________  times. Complete this exercise __________ times a day.   Lunge This exercise stretches the muscle in the inner thigh (adductor). 1. Stand and spread your legs about 3 ft (1 m) apart. Put your left / right leg slightly back for balance. 2. Lean away from your left / right leg by bending your other knee and shifting your weight toward your bent knee. You may rest your hands on your thigh for balance. You should feel a stretch in your left / right inner thigh. 3. Hold this position for __________ seconds. Repeat __________ times. Complete this exercise __________ times a day.   Hamstring stretch, supine 1. Lie on your back (supine position). 2. Loop a belt or towel over the ball of your left / right foot. The ball of your foot is on the walking surface, right under your toes. 3. Straighten your left / right knee and slowly pull on the belt or towel to raise your leg. Stop when  you feel a gentle stretch in the back of your left / right knee or thigh (hamstrings). ? Do not let your left / right knee bend. ? Keep your other leg flat on the floor. 4. Hold this position for __________ seconds. Repeat __________ times. Complete this exercise __________ times a day.   Strengthening exercises These exercises build strength and endurance in your leg. Endurance is the ability to use your muscles for a long time, even after they get tired. Wall slides This exercise strengthens the muscles in the front of your thigh and knee (quadriceps). 1. Lean your back against a smooth wall or door, and walk your feet out 18-24 inches (46-61 cm) from it. 2. Place your feet hip-width apart. 3. Slowly slide down the wall or door until your knees bend as far as told by your health care provider. Keep your knees over your heels, not your toes. Keep your knees in line with your hips. 4. Hold this position for __________ seconds. 5. Use the muscles in the front of your thigh to push yourself up to the standing position. 6. Rest for __________ seconds after each repetition. Repeat __________ times. Complete this exercise __________ times a day.   Straight leg raises, side-lying This exercise is sometimes called a hip abductor exercise. It strengthens the muscles that rotate the leg at the hip and move it away from your body (hip abductors). 1. Lie on your side with your left / right leg in the top position. Lie so your head, shoulder, hip, and knee line up. Bend your bottom knee slightly to help you balance. 2. Lift your top leg 4-6 inches (10-15 cm) while keeping your toes pointed straight ahead. 3. Hold this position for __________ seconds. 4. Slowly lower your leg to the starting position. 5. Let your muscles relax completely after each repetition. Repeat __________ times. Complete this exercise __________ times a day.   Straight leg raises, prone This exercise strengthens the muscles that  move the hips (hip extensors). 1. Lie on your abdomen (prone position) on a firm surface. You can put a pillow under your hips if that is more comfortable for your lower back. 2. Squeeze your buttocks muscles and lift your left / right leg about 4-6 inches (10-15 cm). Keep your knee straight as you lift your leg. 3. Hold this position for __________ seconds. 4. Slowly lower your leg to the starting position. 5. Let your muscles relax completely after each repetition. Repeat __________ times. Complete this exercise __________ times a  day.   Bridge This exercise strengthens the muscles that move the hips (hip extensors). 1. Lie on your back on a firm surface with your knees bent and your feet flat on the floor. 2. Tighten your buttocks muscles and lift your bottom off the floor until the trunk of your body is level with your thighs. ? Do not arch your back. ? You should feel the muscles working in your buttocks and the back of your thighs. If you do not feel these muscles, slide your feet 1-2 inches (2.5-5 cm) farther away from your buttocks. 3. Hold this position for __________ seconds. 4. Slowly lower your hips to the starting position. 5. Let your muscles relax completely after each repetition. 6. If this exercise is too easy, try doing it with your arms crossed over your chest. Repeat __________ times. Complete this exercise __________ times a day.   This information is not intended to replace advice given to you by your health care provider. Make sure you discuss any questions you have with your health care provider. Document Revised: 09/22/2018 Document Reviewed: 08/08/2018 Elsevier Patient Education  2021 [place].  Sciatica  Sciatica is pain, numbness, weakness, or tingling along the path of the sciatic nerve. The sciatic nerve starts in the lower back and runs down the back of each leg. The nerve controls the muscles in the lower leg and in the back of the knee. It also provides  feeling (sensation) to the back of the thigh, the lower leg, and the sole of the foot. Sciatica is a symptom of another medical condition that pinches or puts pressure on the sciatic nerve. Sciatica most often only affects one side of the body. Sciatica usually goes away on its own or with treatment. In some cases, sciatica may come back (recur). What are the causes? This condition is caused by pressure on the sciatic nerve or pinching of the nerve. This may be the result of:  A disk in between the bones of the spine bulging out too far (herniated disk).  Age-related changes in the spinal disks.  A pain disorder that affects a muscle in the buttock.  Extra bone growth near the sciatic nerve.  A break (fracture) of the pelvis.  Pregnancy.  Tumor. This is rare. What increases the risk? The following factors may make you more likely to develop this condition:  Playing sports that place pressure or stress on the spine.  Having poor strength and flexibility.  A history of back injury or surgery.  Sitting for long periods of time.  Doing activities that involve repetitive bending or lifting.  Obesity. What are the signs or symptoms? Symptoms can vary from mild to very severe, and they may include:  Any of these problems in the lower back, leg, hip, or buttock: ? Mild tingling, numbness, or dull aches. ? Burning sensations. ? Sharp pains.  Numbness in the back of the calf or the sole of the foot.  Leg weakness.  Severe back pain that makes movement difficult. Symptoms may get worse when you cough, sneeze, or laugh, or when you sit or stand for long periods of time. How is this diagnosed? This condition may be diagnosed based on:  Your symptoms and medical history.  A physical exam.  Blood tests.  Imaging tests, such as: ? X-rays. ? MRI. ? CT scan. How is this treated? In many cases, this condition improves on its own without treatment. However, treatment may  include:  Reducing or modifying  physical activity.  Exercising and stretching.  Icing and applying heat to the affected area.  Medicines that help to: ? Relieve pain and swelling. ? Relax your muscles.  Injections of medicines that help to relieve pain, irritation, and inflammation around the sciatic nerve (steroids).  Surgery. Follow these instructions at home: Medicines  Take over-the-counter and prescription medicines only as told by your health care provider.  Ask your health care provider if the medicine prescribed to you: ? Requires you to avoid driving or using heavy machinery. ? Can cause constipation. You may need to take these actions to prevent or treat constipation:  Drink enough fluid to keep your urine pale yellow.  Take over-the-counter or prescription medicines.  Eat foods that are high in fiber, such as beans, whole grains, and fresh fruits and vegetables.  Limit foods that are high in fat and processed sugars, such as fried or sweet foods. Managing pain  If directed, put ice on the affected area. ? Put ice in a plastic bag. ? Place a towel between your skin and the bag. ? Leave the ice on for 20 minutes, 2-3 times a day.  If directed, apply heat to the affected area. Use the heat source that your health care provider recommends, such as a moist heat pack or a heating pad. ? Place a towel between your skin and the heat source. ? Leave the heat on for 20-30 minutes. ? Remove the heat if your skin turns bright red. This is especially important if you are unable to feel pain, heat, or cold. You may have a greater risk of getting burned.      Activity  Return to your normal activities as told by your health care provider. Ask your health care provider what activities are safe for you.  Avoid activities that make your symptoms worse.  Take brief periods of rest throughout the day. ? When you rest for longer periods, mix in some mild activity or  stretching between periods of rest. This will help to prevent stiffness and pain. ? Avoid sitting for long periods of time without moving. Get up and move around at least one time each hour.  Exercise and stretch regularly, as told by your health care provider.  Do not lift anything that is heavier than 10 lb (4.5 kg) while you have symptoms of sciatica. When you do not have symptoms, you should still avoid heavy lifting, especially repetitive heavy lifting.  When you lift objects, always use proper lifting technique, which includes: ? Bending your knees. ? Keeping the load close to your body. ? Avoiding twisting.   General instructions  Maintain a healthy weight. Excess weight puts extra stress on your back.  Wear supportive, comfortable shoes. Avoid wearing high heels.  Avoid sleeping on a mattress that is too soft or too hard. A mattress that is firm enough to support your back when you sleep may help to reduce your pain.  Keep all follow-up visits as told by your health care provider. This is important. Contact a health care provider if:  You have pain that: ? Wakes you up when you are sleeping. ? Gets worse when you lie down. ? Is worse than you have experienced in the past. ? Lasts longer than 4 weeks.  You have an unexplained weight loss. Get help right away if:  You are not able to control when you urinate or have bowel movements (incontinence).  You have: ? Weakness in your lower back, pelvis, buttocks,  or legs that gets worse. ? Redness or swelling of your back. ? A burning sensation when you urinate. Summary  Sciatica is pain, numbness, weakness, or tingling along the path of the sciatic nerve.  This condition is caused by pressure on the sciatic nerve or pinching of the nerve.  Sciatica can cause pain, numbness, or tingling in the lower back, legs, hips, and buttocks.  Treatment often includes rest, exercise, medicines, and applying ice or heat. This  information is not intended to replace advice given to you by your health care provider. Make sure you discuss any questions you have with your health care provider. Document Revised: 06/20/2018 Document Reviewed: 06/20/2018 Elsevier Patient Education  2021 [place].  Sciatica Rehab Ask your health care provider which exercises are safe for you. Do exercises exactly as told by your health care provider and adjust them as directed. It is normal to feel mild stretching, pulling, tightness, or discomfort as you do these exercises. Stop right away if you feel sudden pain or your pain gets worse. Do not begin these exercises until told by your health care provider. Stretching and range-of-motion exercises These exercises warm up your muscles and joints and improve the movement and flexibility of your hips and back. These exercises also help to relieve pain, numbness, and tingling. Sciatic nerve glide 1. Sit in a chair with your head facing down toward your chest. Place your hands behind your back. Let your shoulders slump forward. 2. Slowly straighten one of your legs while you tilt your head back as if you are looking toward the ceiling. Only straighten your leg as far as you can without making your symptoms worse. 3. Hold this position for __________ seconds. 4. Slowly return to the starting position. 5. Repeat with your other leg. Repeat __________ times. Complete this exercise __________ times a day. Knee to chest with hip adduction and internal rotation 1. Lie on your back on a firm surface with both legs straight. 2. Bend one of your knees and move it up toward your chest until you feel a gentle stretch in your lower back and buttock. Then, move your knee toward the shoulder that is on the opposite side from your leg. This is hip adduction and internal rotation. ? Hold your leg in this position by holding on to the front of your knee. 3. Hold this position for __________ seconds. 4. Slowly  return to the starting position. 5. Repeat with your other leg. Repeat __________ times. Complete this exercise __________ times a day.   Prone extension on elbows 1. Lie on your abdomen on a firm surface. A bed may be too soft for this exercise. 2. Prop yourself up on your elbows. 3. Use your arms to help lift your chest up until you feel a gentle stretch in your abdomen and your lower back. ? This will place some of your body weight on your elbows. If this is uncomfortable, try stacking pillows under your chest. ? Your hips should stay down, against the surface that you are lying on. Keep your hip and back muscles relaxed. 4. Hold this position for __________ seconds. 5. Slowly relax your upper body and return to the starting position. Repeat __________ times. Complete this exercise __________ times a day.   Strengthening exercises These exercises build strength and endurance in your back. Endurance is the ability to use your muscles for a long time, even after they get tired. Pelvic tilt This exercise strengthens the muscles that lie deep  in the abdomen. 1. Lie on your back on a firm surface. Bend your knees and keep your feet flat on the floor. 2. Tense your abdominal muscles. Tip your pelvis up toward the ceiling and flatten your lower back into the floor. ? To help with this exercise, you may place a small towel under your lower back and try to push your back into the towel. 3. Hold this position for __________ seconds. 4. Let your muscles relax completely before you repeat this exercise. Repeat __________ times. Complete this exercise __________ times a day. Alternating arm and leg raises 1. Get on your hands and knees on a firm surface. If you are on a hard floor, you may want to use padding, such as an exercise mat, to cushion your knees. 2. Line up your arms and legs. Your hands should be directly below your shoulders, and your knees should be directly below your hips. 3. Lift your  left leg behind you. At the same time, raise your right arm and straighten it in front of you. ? Do not lift your leg higher than your hip. ? Do not lift your arm higher than your shoulder. ? Keep your abdominal and back muscles tight. ? Keep your hips facing the ground. ? Do not arch your back. ? Keep your balance carefully, and do not hold your breath. 4. Hold this position for __________ seconds. 5. Slowly return to the starting position. 6. Repeat with your right leg and your left arm. Repeat __________ times. Complete this exercise __________ times a day.   Posture and body mechanics Good posture and healthy body mechanics can help to relieve stress in your body's tissues and joints. Body mechanics refers to the movements and positions of your body while you do your daily activities. Posture is part of body mechanics. Good posture means:  Your spine is in its natural S-curve position (neutral).  Your shoulders are pulled back slightly.  Your head is not tipped forward. Follow these guidelines to improve your posture and body mechanics in your everyday activities. Standing  When standing, keep your spine neutral and your feet about hip width apart. Keep a slight bend in your knees. Your ears, shoulders, and hips should line up.  When you do a task in which you stand in one place for a long time, place one foot up on a stable object that is 2-4 inches (5-10 cm) high, such as a footstool. This helps keep your spine neutral.   Sitting  When sitting, keep your spine neutral and keep your feet flat on the floor. Use a footrest, if necessary, and keep your thighs parallel to the floor. Avoid rounding your shoulders, and avoid tilting your head forward.  When working at a desk or a computer, keep your desk at a height where your hands are slightly lower than your elbows. Slide your chair under your desk so you are close enough to maintain good posture.  When working at a computer, place  your monitor at a height where you are looking straight ahead and you do not have to tilt your head forward or downward to look at the screen.   Resting  When lying down and resting, avoid positions that are most painful for you.  If you have pain with activities such as sitting, bending, stooping, or squatting, lie in a position in which your body does not bend very much. For example, avoid curling up on your side with your arms and knees near  your chest (fetal position).  If you have pain with activities such as standing for a long time or reaching with your arms, lie with your spine in a neutral position and bend your knees slightly. Try the following positions: ? Lying on your side with a pillow between your knees. ? Lying on your back with a pillow under your knees. Lifting  When lifting objects, keep your feet at least shoulder width apart and tighten your abdominal muscles.  Bend your knees and hips and keep your spine neutral. It is important to lift using the strength of your legs, not your back. Do not lock your knees straight out.  Always ask for help to lift heavy or awkward objects.   This information is not intended to replace advice given to you by your health care provider. Make sure you discuss any questions you have with your health care provider. Document Revised: 09/23/2018 Document Reviewed: 06/23/2018 Elsevier Patient Education  [place].

## 2020-08-30 ENCOUNTER — Encounter: Payer: Self-pay | Admitting: Family Medicine

## 2020-09-11 ENCOUNTER — Ambulatory Visit: Payer: 59 | Admitting: Family Medicine

## 2020-09-14 ENCOUNTER — Other Ambulatory Visit: Payer: Self-pay | Admitting: Family Medicine

## 2020-10-03 ENCOUNTER — Telehealth: Payer: Self-pay | Admitting: Family Medicine

## 2020-10-03 NOTE — Telephone Encounter (Signed)
The patient called wanting to know if she has had her shingles shot and was wanting to know if she needs to get the shingles shot because she was told that she's supposed to have it every 10 years.   I advised her that I will have Dr. Volanda Napoleon medical assistant look into it for her and give her a call back.  Please advise

## 2020-10-04 NOTE — Telephone Encounter (Signed)
ATC, no answer, per FYI left message to call office to schedule an appointment. Advised it is not every 10 years, but it is a 2 dose vaccine and can call and schedule an appointment if she would like.

## 2020-10-14 ENCOUNTER — Ambulatory Visit: Payer: 59

## 2020-11-26 ENCOUNTER — Other Ambulatory Visit: Payer: Self-pay

## 2020-11-27 ENCOUNTER — Ambulatory Visit (INDEPENDENT_AMBULATORY_CARE_PROVIDER_SITE_OTHER): Payer: 59 | Admitting: Family Medicine

## 2020-11-27 ENCOUNTER — Encounter: Payer: Self-pay | Admitting: Family Medicine

## 2020-11-27 VITALS — BP 130/78 | HR 75 | Temp 98.1°F | Ht 62.0 in | Wt 204.6 lb

## 2020-11-27 DIAGNOSIS — E782 Mixed hyperlipidemia: Secondary | ICD-10-CM | POA: Diagnosis not present

## 2020-11-27 DIAGNOSIS — R7303 Prediabetes: Secondary | ICD-10-CM

## 2020-11-27 DIAGNOSIS — E66812 Obesity, class 2: Secondary | ICD-10-CM

## 2020-11-27 DIAGNOSIS — E559 Vitamin D deficiency, unspecified: Secondary | ICD-10-CM | POA: Diagnosis not present

## 2020-11-27 DIAGNOSIS — I10 Essential (primary) hypertension: Secondary | ICD-10-CM

## 2020-11-27 DIAGNOSIS — Z0000 Encounter for general adult medical examination without abnormal findings: Secondary | ICD-10-CM

## 2020-11-27 DIAGNOSIS — Z6839 Body mass index (BMI) 39.0-39.9, adult: Secondary | ICD-10-CM

## 2020-11-27 DIAGNOSIS — R1906 Epigastric swelling, mass or lump: Secondary | ICD-10-CM | POA: Diagnosis not present

## 2020-11-27 DIAGNOSIS — F4321 Adjustment disorder with depressed mood: Secondary | ICD-10-CM

## 2020-11-27 LAB — COMPREHENSIVE METABOLIC PANEL
ALT: 24 U/L (ref 0–35)
AST: 20 U/L (ref 0–37)
Albumin: 4.3 g/dL (ref 3.5–5.2)
Alkaline Phosphatase: 60 U/L (ref 39–117)
BUN: 16 mg/dL (ref 6–23)
CO2: 28 mEq/L (ref 19–32)
Calcium: 9.6 mg/dL (ref 8.4–10.5)
Chloride: 107 mEq/L (ref 96–112)
Creatinine, Ser: 0.83 mg/dL (ref 0.40–1.20)
GFR: 73.05 mL/min (ref >60.00)
Glucose, Bld: 88 mg/dL (ref 70–99)
Potassium: 3.8 mEq/L (ref 3.5–5.1)
Sodium: 142 mEq/L (ref 135–145)
Total Bilirubin: 0.4 mg/dL (ref 0.2–1.2)
Total Protein: 6.7 g/dL (ref 6.0–8.3)

## 2020-11-27 LAB — CBC WITH DIFFERENTIAL/PLATELET
Basophils Absolute: 0.1 10*3/uL (ref 0.0–0.1)
Basophils Relative: 0.9 % (ref 0.0–3.0)
Eosinophils Absolute: 0.7 10*3/uL (ref 0.0–0.7)
Eosinophils Relative: 9.2 % — ABNORMAL HIGH (ref 0.0–5.0)
HCT: 37.2 % (ref 36.0–46.0)
Hemoglobin: 12.7 g/dL (ref 12.0–15.0)
Lymphocytes Relative: 35 % (ref 12.0–46.0)
Lymphs Abs: 2.6 10*3/uL (ref 0.7–4.0)
MCHC: 34.2 g/dL (ref 30.0–36.0)
MCV: 91.2 fl (ref 78.0–100.0)
Monocytes Absolute: 0.6 10*3/uL (ref 0.1–1.0)
Monocytes Relative: 7.5 % (ref 3.0–12.0)
Neutro Abs: 3.5 10*3/uL (ref 1.4–7.7)
Neutrophils Relative %: 47.4 % (ref 43.0–77.0)
Platelets: 352 10*3/uL (ref 150.0–400.0)
RBC: 4.08 Mil/uL (ref 3.87–5.11)
RDW: 13.2 % (ref 11.5–15.5)
WBC: 7.5 10*3/uL (ref 4.0–10.5)

## 2020-11-27 LAB — LIPID PANEL
Cholesterol: 228 mg/dL — ABNORMAL HIGH (ref 0–200)
HDL: 45.1 mg/dL (ref >39.00)
LDL Cholesterol: 153 mg/dL — ABNORMAL HIGH (ref 0–99)
NonHDL: 182.41
Total CHOL/HDL Ratio: 5
Triglycerides: 148 mg/dL (ref 0.0–149.0)
VLDL: 29.6 mg/dL (ref 0.0–40.0)

## 2020-11-27 LAB — HEMOGLOBIN A1C: Hgb A1c MFr Bld: 6.1 % (ref 4.6–6.5)

## 2020-11-27 LAB — VITAMIN D 25 HYDROXY (VIT D DEFICIENCY, FRACTURES): VITD: 23.34 ng/mL — ABNORMAL LOW (ref 30.00–100.00)

## 2020-11-27 LAB — T4, FREE: Free T4: 0.95 ng/dL (ref 0.60–1.60)

## 2020-11-27 LAB — TSH: TSH: 0.93 u[IU]/mL (ref 0.35–4.50)

## 2020-11-27 NOTE — Progress Notes (Signed)
Subjective:     Rebecca [name] is a 65 y.o. female and is here for a comprehensive physical exam. The patient reports  increased stress/grief.  Pt dealing with the loss of several family members.   .  Having  difficulty losing weight, has h/o preDM.  Taking lisinopril daily.  Pt mentions abdominal mass.  Best appreciated when standing.    Social History   Socioeconomic History   Marital status: Married    Spouse name: Not on file   Number of children: Not on file   Years of education: Not on file   Highest education level: Not on file  Occupational History   Not on file  Tobacco Use   Smoking status: Former    Packs/day: 0.50    Years: 15.00    Pack years: 7.50    Types: Cigarettes    Quit date: 06/02/1994    Years since quitting: 26.5   Smokeless tobacco: Never  Vaping Use   Vaping Use: Never used  Substance and Sexual Activity   Alcohol use: No   Drug use: No   Sexual activity: Not on file  Other Topics Concern   Not on file  Social History Narrative   Work or School: works part time with Physicist, 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       Home Situation: lives with husband      Spiritual Beliefs: Christian  - Education administrator, Dentist      Lifestyle: wt watchers 2019, starting to exercise         Social Determinants of Radio broadcast assistant Strain: Not on file  Food Insecurity: Not on file  Transportation Needs: Not on file  Physical Activity: Not on file  Stress: Not on file  Social Connections: Not on file  Intimate Partner Violence: Not on file   Health Maintenance  Topic Date Due   Zoster Vaccines- Shingrix (1 of 2) Never done   TETANUS/TDAP  11/13/2017   MAMMOGRAM  02/13/2018   COVID-19 Vaccine (4 - Booster for Pfizer series) 07/04/2020   PNA vac Low Risk Adult (2 of 2 - PCV13) 08/16/2020   INFLUENZA VACCINE  01/13/2021   COLONOSCOPY (Pts 45-69yrs Insurance coverage will need to be confirmed)  08/07/2027   DEXA SCAN   Completed   Hepatitis C Screening  Completed   HPV VACCINES  Aged Out   Family History  Problem Relation Age of Onset   Cancer Mother        breast   Breast cancer Mother    Alcohol abuse Father    Heart disease Father 57       MI   Hypertension Father    Colon cancer Neg Hx     The following portions of the patient's history were reviewed and updated as appropriate: allergies, current medications, past family history, past medical history, past social history, past surgical history, and problem list.  Review of Systems Pertinent items noted in HPI and remainder of comprehensive ROS otherwise negative.   Objective:    BP 130/78 (BP Location: Left Arm, Patient Position: Sitting, Cuff Size: Normal)   Pulse 75   Temp 98.1 F (36.7 C) (Oral)   Ht 5\' 2"  (1.575 m)   Wt 204 lb 9.6 oz (92.8 kg)   SpO2 96%   BMI 37.42 kg/m  General appearance: alert, cooperative, and no distress Head: Normocephalic, without obvious abnormality, atraumatic Eyes: conjunctivae/corneas clear. PERRL, EOM's intact. Fundi benign.  Ears: normal TM's and external ear canals both ears Nose: Nares normal. Septum midline. Mucosa normal. No drainage or sinus tenderness. Throat: lips, mucosa, and tongue normal; teeth and gums normal Neck: no adenopathy, no carotid bruit, no JVD, supple, symmetrical, trachea midline, and thyroid not enlarged, symmetric, no tenderness/mass/nodules Lungs: clear to auscultation bilaterally Heart: regular rate and rhythm, S1, S2 normal, no murmur, click, rub or gallop Abdomen:  BS present, well healed laparoscopic surgical incisions, soft, NT/ND,  round soft mobile mass left of mid upper epigastric area best appreciated with sitting up or standing upright.  No hepatosplenomegaly Extremities: extremities normal, atraumatic, no cyanosis or edema Pulses: 2+ and symmetric Skin: Skin color, texture, turgor normal. No rashes or lesions Lymph nodes: Cervical, supraclavicular, and axillary  nodes normal. Neurologic: Alert and oriented X 3, normal strength and tone. Normal symmetric reflexes. Normal coordination and gait    Assessment:    Healthy female exam with epigastric abd mass-lipoma vs hernia.     Plan:    Anticipatory guidance given including wearing seatbelts, smoke detectors in the home, increasing physical activity, increasing p.o. intake of water and vegetables. -obtain labs -colonoscopy done 08/06/17 -mammogram done 02/02/20 -given handout -next CPE in 1 yr See After Visit Summary for Counseling Recommendations   Epigastric mass  -lipoma vs hernia -will obtain imaging to further evaluate mass. - Plan: CBC with Differential/Platelet, CMP, CT Abdomen Pelvis Wo Contrast  Grief -self care -consider counseling or support groups -continue to monitor  Vitamin D deficiency  - Plan: Vitamin D, 25-hydroxy  Essential hypertension  -elevated possibly 2/2 increased stress -lifestyle modifications -continue current meds including lisinopril 30 mg daily. - Plan: CMP  Mixed hyperlipidemia  - Plan: Lipid panel  Class 2 severe obesity due to excess calories with serious comorbidity and body mass index (BMI) of 39.0 to 39.9 in adult [facility])  -discussed diet and exercise - Plan: Hemoglobin A1c, Lipid panel, Vitamin D, 25-hydroxy, CMP  Prediabetes  -hgb A1C 5.9% on 12/27/19  -lifestyle modifications - Plan: Hemoglobin A1c  F/u in 2-3 months  Grier Mitts, MD

## 2020-11-28 ENCOUNTER — Other Ambulatory Visit: Payer: Self-pay | Admitting: Family Medicine

## 2020-11-28 DIAGNOSIS — E559 Vitamin D deficiency, unspecified: Secondary | ICD-10-CM

## 2020-11-28 MED ORDER — VITAMIN D (ERGOCALCIFEROL) 1.25 MG (50000 UNIT) PO CAPS: 50000.0000 [IU] | ORAL_CAPSULE | ORAL | 0 refills | Status: DC

## 2020-11-30 ENCOUNTER — Other Ambulatory Visit: Payer: Self-pay | Admitting: Family Medicine

## 2021-02-27 ENCOUNTER — Other Ambulatory Visit: Payer: Self-pay | Admitting: Family Medicine

## 2021-02-27 DIAGNOSIS — E559 Vitamin D deficiency, unspecified: Secondary | ICD-10-CM

## 2021-03-10 ENCOUNTER — Other Ambulatory Visit: Payer: Self-pay | Admitting: Family Medicine

## 2021-03-10 DIAGNOSIS — E559 Vitamin D deficiency, unspecified: Secondary | ICD-10-CM

## 2021-04-18 ENCOUNTER — Telehealth: Payer: Self-pay

## 2021-04-18 NOTE — Telephone Encounter (Signed)
Caller states her most recent bg was 90 (1hr ago,drinking oj now), has been dizzy (a little now), feels tired, & nauseated (not now); first noted yesterday,mostly in the morning. Denies any other symptoms. Denies diabetic meds/insulin.Told she was borderline diabetic about 4-71mos ago. Has been stressed lately, & eating Halloween candy.  04/18/2021 8:51:24 AM Call PCP within 24 Hours Yes Cazares, RN, Domenick Gong Disposition Overriden: [place] Reason: Patient's symptoms need a higher level of care Caller Complies Caller Understands Yes  User: Domenick Gong, Micki Riley, RN Date/Time ([place] Time): 04/18/2021 8:51:11 AM Transferred caller to main office line [phone] for caller to make appt, per client directives.  04/18/21 1206: No appt noted at this time. Pt called. States she still feels "off". Upon review of the chart, pt not noted to be diabetic. BG of 90 is a fasting glucose as pt last meal was 04/17/21 approx 1900. Pt advised that glucose is a normal fasting sugar & symptoms are consistent with hypoglycemia/hunger. Pt states she still has not had a meal. Pt advised to eat & assess how she feels at that time. If pt continues to feel "off" then she should visit UC as there are no openings in office at this time. F/u appt made for intermittent dizziness; Nov 7.

## 2021-04-19 ENCOUNTER — Emergency Department (HOSPITAL_COMMUNITY)
Admission: EM | Admit: 2021-04-19 | Discharge: 2021-04-19 | Disposition: A | Discharge status: Home / Self Care | Payer: 59 | Attending: Emergency Medicine | Admitting: Emergency Medicine

## 2021-04-19 ENCOUNTER — Other Ambulatory Visit: Payer: Self-pay

## 2021-04-19 ENCOUNTER — Emergency Department (HOSPITAL_COMMUNITY): Payer: 59

## 2021-04-19 ENCOUNTER — Encounter (HOSPITAL_COMMUNITY): Payer: Self-pay | Admitting: Emergency Medicine

## 2021-04-19 DIAGNOSIS — I10 Essential (primary) hypertension: Secondary | ICD-10-CM | POA: Diagnosis not present

## 2021-04-19 DIAGNOSIS — R519 Headache, unspecified: Secondary | ICD-10-CM | POA: Insufficient documentation

## 2021-04-19 DIAGNOSIS — R42 Dizziness and giddiness: Secondary | ICD-10-CM | POA: Insufficient documentation

## 2021-04-19 DIAGNOSIS — J45909 Unspecified asthma, uncomplicated: Secondary | ICD-10-CM | POA: Insufficient documentation

## 2021-04-19 DIAGNOSIS — M25511 Pain in right shoulder: Secondary | ICD-10-CM | POA: Diagnosis not present

## 2021-04-19 DIAGNOSIS — Z79899 Other long term (current) drug therapy: Secondary | ICD-10-CM | POA: Diagnosis not present

## 2021-04-19 DIAGNOSIS — Z7951 Long term (current) use of inhaled steroids: Secondary | ICD-10-CM | POA: Diagnosis not present

## 2021-04-19 DIAGNOSIS — Z87891 Personal history of nicotine dependence: Secondary | ICD-10-CM | POA: Insufficient documentation

## 2021-04-19 LAB — COMPREHENSIVE METABOLIC PANEL
ALT: 20 U/L (ref 0–44)
AST: 19 U/L (ref 15–41)
Albumin: 4.1 g/dL (ref 3.5–5.0)
Alkaline Phosphatase: 67 U/L (ref 38–126)
Anion gap: 6 (ref 5–15)
BUN: 21 mg/dL (ref 8–23)
CO2: 27 mmol/L (ref 22–32)
Calcium: 9.2 mg/dL (ref 8.9–10.3)
Chloride: 105 mmol/L (ref 98–111)
Creatinine, Ser: 0.86 mg/dL (ref 0.44–1.00)
GFR, Estimated: >60 mL/min (ref >60)
Glucose, Bld: 100 mg/dL — ABNORMAL HIGH (ref 70–99)
Potassium: 3.4 mmol/L — ABNORMAL LOW (ref 3.5–5.1)
Sodium: 138 mmol/L (ref 135–145)
Total Bilirubin: 0.7 mg/dL (ref 0.3–1.2)
Total Protein: 7.4 g/dL (ref 6.5–8.1)

## 2021-04-19 LAB — CBC
HCT: 38.8 % (ref 36.0–46.0)
Hemoglobin: 13.2 g/dL (ref 12.0–15.0)
MCH: 31 pg (ref 26.0–34.0)
MCHC: 34 g/dL (ref 30.0–36.0)
MCV: 91.1 fL (ref 80.0–100.0)
Platelets: 408 10*3/uL — ABNORMAL HIGH (ref 150–400)
RBC: 4.26 MIL/uL (ref 3.87–5.11)
RDW: 13.1 % (ref 11.5–15.5)
WBC: 11.4 10*3/uL — ABNORMAL HIGH (ref 4.0–10.5)
nRBC: 0 % (ref 0.0–0.2)

## 2021-04-19 LAB — URINALYSIS, ROUTINE W REFLEX MICROSCOPIC
Bilirubin Urine: NEGATIVE
Glucose, UA: NEGATIVE mg/dL
Hgb urine dipstick: NEGATIVE
Ketones, ur: NEGATIVE mg/dL
Nitrite: NEGATIVE
Protein, ur: NEGATIVE mg/dL
Specific Gravity, Urine: 1.006 (ref 1.005–1.030)
pH: 7 (ref 5.0–8.0)

## 2021-04-19 LAB — LIPASE, BLOOD: Lipase: 72 U/L — ABNORMAL HIGH (ref 11–51)

## 2021-04-19 MED ORDER — ONDANSETRON 4 MG PO TBDP: 4.0000 mg | ORAL_TABLET | Freq: Once | ORAL | Status: DC | PRN

## 2021-04-19 MED ORDER — MECLIZINE HCL 25 MG PO TABS
25.0000 mg | ORAL_TABLET | Freq: Once | ORAL | Status: AC
  Administered 2021-04-19: 25 mg via ORAL
  Filled 2021-04-19: qty 1

## 2021-04-19 MED ORDER — MECLIZINE HCL 25 MG PO TABS: 25.0000 mg | ORAL_TABLET | Freq: Three times a day (TID) | ORAL | 0 refills | Status: DC | PRN

## 2021-04-19 MED ORDER — SODIUM CHLORIDE 0.9 % IV BOLUS
1000.0000 mL | Freq: Once | INTRAVENOUS | Status: AC
  Administered 2021-04-19: 1000 mL via INTRAVENOUS

## 2021-04-19 NOTE — ED Triage Notes (Signed)
Patient is complaining of headaches x 1 week, nausea since yesterday, and dizziness x 2 todays. Patient has no other complaints.

## 2021-04-19 NOTE — Discharge Instructions (Signed)
Eat and drink as well as you can for the next 48 hours.  Please follow-up with your family doctor.  Please return for worsening symptoms inability to walk one-sided numbness or weakness difficulty with speech or swallowing.

## 2021-04-19 NOTE — ED Provider Notes (Signed)
Rebecca [name] DEPT Provider Note   CSN: [id number] Arrival date & time: 04/19/21  0117     History Chief Complaint  Patient presents with   Dizziness   Headache    Rebecca [name] is a 65 y.o. female.  65 yo F with a chief complaints of dizziness.  This is been going on for the past couple days.  She tells me that she feels lightheaded like she might pass out.  This seems occur every morning the past couple mornings and then gets better throughout the day.  She denies any new medications that she takes at night before she goes to bed she denies any changes to her diet denies blood in her stool denies dark stool denies decreased oral intake denies abdominal pain denies chest pain trouble breathing.  She has been having some right sided shoulder pain that radiates down the arm.  This been going on for a couple months or so.  She called her family doctor few days ago and they told her that she needed to come to the ED to be evaluated.  She takes her blood pressure medicine in the morning but typically is dizzy before she takes her medicine.  The history is provided by the patient.  Dizziness Quality:  Lightheadedness Severity:  Moderate Onset quality:  Gradual Duration:  2 days Timing:  Constant Progression:  Worsening Chronicity:  New Context: standing up   Relieved by:  Nothing Worsened by:  Nothing Ineffective treatments:  None tried Associated symptoms: headaches   Associated symptoms: no chest pain, no nausea, no palpitations, no shortness of breath and no vomiting   Headache Associated symptoms: dizziness   Associated symptoms: no congestion, no fever, no myalgias, no nausea and no vomiting       Past Medical History:  Diagnosis Date   Asthma    Blindness, legal    L EYE   Hearing loss    Hyperlipidemia    Hypertension    Lapband May 2013 11/18/2011   Migraines    Obesity, Class III, BMI 40-49.9 (morbid obesity) ([place]) 06/03/2011   Sleep  apnea    uses c-pap    Patient Active Problem List   Diagnosis Date Noted   Prediabetes 12/29/2019   Vitamin D deficiency 12/29/2019   Class 2 obesity due to excess calories with serious comorbidity and body mass index (BMI) of 38.0 to 38.9 in adult 08/31/2016   Hyperlipidemia 05/04/2016   Essential hypertension, benign 12/26/2012   Asthma 12/26/2012   OSA (obstructive sleep apnea) 10/05/2011    Past Surgical History:  Procedure Laterality Date   ABDOMINAL HYSTERECTOMY  2006   fibroids   BREATH TEK H PYLORI  08/17/2011   Procedure: BREATH TEK H PYLORI;  Surgeon: Pedro Earls, MD;  Location: [facility];  Service: General;  Laterality: N/A;  PATIENT WILL COME AT 0715   COLONOSCOPY  2008   @ [place]     Patient unsure of surgery date. Left eye   KNEE SURGERY  1992   right   LAPAROSCOPIC GASTRIC BANDING  10/20/2011   Procedure: LAPAROSCOPIC GASTRIC BANDING;  Surgeon: Pedro Earls, MD;  Location: [facility];  Service: General;  Laterality: N/A;     OB History   No obstetric history on file.     Family History  Problem Relation Age of Onset   Cancer Mother        breast   Breast cancer Mother  Alcohol abuse Father    Heart disease Father 70       MI   Hypertension Father    Colon cancer Neg Hx     Social History   Tobacco Use   Smoking status: Former    Packs/day: 0.50    Years: 15.00    Pack years: 7.50    Types: Cigarettes    Quit date: 06/02/1994    Years since quitting: 26.8   Smokeless tobacco: Never  Vaping Use   Vaping Use: Never used  Substance Use Topics   Alcohol use: No   Drug use: No    Home Medications Prior to Admission medications   Medication Sig Start Date End Date Taking? Authorizing Provider  meclizine (ANTIVERT) 25 MG tablet Take 1 tablet (25 mg total) by mouth 3 (three) times daily as needed for dizziness. 04/19/21  Yes Deno Etienne, DO  albuterol (PROVENTIL) (2.5 MG/3ML) 0.083% nebulizer solution Take 3 mLs (2.5 mg  total) by nebulization every 4 (four) hours as needed for wheezing or shortness of breath. 11/15/19   Kennith Gain, MD  albuterol (VENTOLIN HFA) 108 (90 Base) MCG/ACT inhaler Use 2 puffs every 4 hours as needed for cough or wheeze.  May use  2 puffs 10-20 minutes prior to exercise. 11/15/19   Kennith Gain, MD  atorvastatin (LIPITOR) 20 MG tablet TAKE 1 TABLET BY MOUTH EVERY DAY 04/29/19   Billie Ruddy, MD  budesonide-formoterol [facility]) 160-4.5 MCG/ACT inhaler Inhale 2 puffs into the lungs 2 (two) times daily. 11/21/19   Kennith Gain, MD  Calcium Citrate-Vitamin D (CALCIUM CITRATE + PO) Take 500 mg by mouth 3 (three) times daily.    [provider]  cetirizine (ZYRTEC ALLERGY) 10 MG tablet Take 1 tablet (10 mg total) by mouth daily. 11/15/19   Kennith Gain, MD  Cholecalciferol (VITAMIN D-3 PO) Take by mouth daily.    [provider]  cyclobenzaprine (FLEXERIL) 5 MG tablet Take 1 tablet (5 mg total) by mouth at bedtime as needed for muscle spasms. 08/19/20   Billie Ruddy, MD  fluticasone (FLONASE) 50 MCG/ACT nasal spray Place 2 sprays into both nostrils daily. For congestion 11/15/19   Kennith Gain, MD  hydrochlorothiazide (HYDRODIURIL) 12.5 MG tablet TAKE 1 TABLET BY MOUTH EVERY DAY 12/02/20   Billie Ruddy, MD  lisinopril (ZESTRIL) 30 MG tablet TAKE 1 TABLET BY MOUTH EVERY DAY 09/16/20   Billie Ruddy, MD  montelukast (SINGULAIR) 10 MG tablet TAKE 1 TABLET BY MOUTH EVERYDAY AT BEDTIME 11/15/19   Padgett, Rae Halsted, MD  Multiple Vitamins-Minerals (MULTIVITAMIN WITH MINERALS) tablet Take 1 tablet by mouth daily with breakfast.    [provider]  Olopatadine HCl (PATANASE) 0.6 % SOLN Use 1-2 sprays in each nostril twice daily. 11/15/19   Kennith Gain, MD  UNABLE TO FIND Med Name: silver hydrosil nasal spray., for immune support    [provider]  Vitamin D, Ergocalciferol, (DRISDOL)  1.25 MG (50000 UNIT) CAPS capsule Take 1 capsule (50,000 Units total) by mouth every 7 (seven) days. 11/28/20   Billie Ruddy, MD    Allergies    Hydrocodone and Sulfa antibiotics  Review of Systems   Review of Systems  Constitutional:  Negative for chills and fever.  HENT:  Negative for congestion and rhinorrhea.   Eyes:  Negative for redness and visual disturbance.  Respiratory:  Negative for shortness of breath and wheezing.   Cardiovascular:  Negative for chest  pain and palpitations.  Gastrointestinal:  Negative for nausea and vomiting.  Genitourinary:  Negative for dysuria and urgency.  Musculoskeletal:  Negative for arthralgias and myalgias.  Skin:  Negative for pallor and wound.  Neurological:  Positive for dizziness, light-headedness and headaches.   Physical Exam Updated Vital Signs BP (!) 149/78 (BP Location: Right Arm)   Pulse 80   Temp 98 F (36.7 C) (Oral)   Resp 17   Ht 5\' 2"  (1.575 m)   Wt 93.9 kg   SpO2 100%   BMI 37.86 kg/m   Physical Exam Vitals and nursing note reviewed.  Constitutional:      General: She is not in acute distress.    Appearance: She is well-developed. She is not diaphoretic.  HENT:     Head: Normocephalic and atraumatic.  Eyes:     Pupils: Pupils are equal, round, and reactive to light.  Cardiovascular:     Rate and Rhythm: Normal rate and regular rhythm.     Heart sounds: No murmur heard.   No friction rub. No gallop.  Pulmonary:     Effort: Pulmonary effort is normal.     Breath sounds: No wheezing or rales.  Abdominal:     General: There is no distension.     Palpations: Abdomen is soft.     Tenderness: There is no abdominal tenderness.  Musculoskeletal:        General: No tenderness.     Cervical back: Normal range of motion and neck supple.  Skin:    General: Skin is warm and dry.  Neurological:     Mental Status: She is alert and oriented to person, place, and time.     GCS: GCS eye subscore is 4. GCS verbal  subscore is 5. GCS motor subscore is 6.     Cranial Nerves: No cranial nerve deficit.     Motor: Motor function is intact.     Coordination: Coordination is intact.     Gait: Gait is intact.     Comments: Benign neuro exam.  Ambulates without issue  Psychiatric:        Behavior: Behavior normal.    ED Results / Procedures / Treatments   Labs (all labs ordered are listed, but only abnormal results are displayed) Labs Reviewed  LIPASE, BLOOD - Abnormal; Notable for the following components:      Result Value   Lipase 72 (*)    All other components within normal limits  COMPREHENSIVE METABOLIC PANEL - Abnormal; Notable for the following components:   Potassium 3.4 (*)    Glucose, Bld 100 (*)    All other components within normal limits  CBC - Abnormal; Notable for the following components:   WBC 11.4 (*)    Platelets 408 (*)    All other components within normal limits  URINALYSIS, ROUTINE W REFLEX MICROSCOPIC - Abnormal; Notable for the following components:   Color, Urine STRAW (*)    Leukocytes,Ua MODERATE (*)    Bacteria, UA MANY (*)    All other components within normal limits    EKG None  Radiology CT Head Wo Contrast  Result Date: 04/19/2021 CLINICAL DATA:  Headache EXAM: CT HEAD WITHOUT CONTRAST TECHNIQUE: Contiguous axial images were obtained from the base of the skull through the vertex without intravenous contrast. COMPARISON:  None. FINDINGS: Brain: There is no mass, hemorrhage or extra-axial collection. The size and configuration of the ventricles and extra-axial CSF spaces are normal. The brain parenchyma is normal, without  acute or chronic infarction. Vascular: No abnormal hyperdensity of the major intracranial arteries or dural venous sinuses. No intracranial atherosclerosis. Skull: The visualized skull base, calvarium and extracranial soft tissues are normal. Sinuses/Orbits: No fluid levels or advanced mucosal thickening of the visualized paranasal sinuses. No  mastoid or middle ear effusion. The orbits are normal. IMPRESSION: Normal head CT. Electronically Signed   By: Ulyses Jarred M.D.   On: 04/19/2021 03:04    Procedures Procedures   Medications Ordered in ED Medications  ondansetron (ZOFRAN-ODT) disintegrating tablet 4 mg (has no administration in time range)  meclizine (ANTIVERT) tablet 25 mg (25 mg Oral Given 04/19/21 0247)  sodium chloride 0.9 % bolus 1,000 mL (0 mLs Intravenous Stopped 04/19/21 0414)    ED Course  I have reviewed the triage vital signs and the nursing notes.  Pertinent labs & imaging results that were available during my care of the patient were reviewed by me and considered in my medical decision making (see chart for details).    MDM Rules/Calculators/A&P                           65 yo F with a chief complaints of dizziness.  This is described as a lightheaded sensation.  Seems occur when she first stands up in the morning and gets better throughout the day.  She has a benign neurologic exam here.  She is also endorsing morning headaches which are new for her.  Will obtain CT imaging.  Blood work bolus of IV fluids meclizine and reassess.   Patient feeling better on reassessment.  Lab work without significant electrolyte abnormality.  No significant anemia.  Continues to be able to ambulate without issue.  Discharged home.  Final Clinical Impression(s) / ED Diagnoses Final diagnoses:  Lightheadedness    Rx / DC Orders ED Discharge Orders          Ordered    meclizine (ANTIVERT) 25 MG tablet  3 times daily PRN        04/19/21 [place], [place], DO 04/19/21 0448

## 2021-04-21 ENCOUNTER — Ambulatory Visit: Payer: 59 | Admitting: Family Medicine

## 2021-04-24 ENCOUNTER — Telehealth: Payer: Self-pay

## 2021-04-24 NOTE — Telephone Encounter (Signed)
Noted  

## 2021-04-24 NOTE — Telephone Encounter (Signed)
Patient called to apologize for missing appt 11/7 and will call back to reschedule

## 2021-05-15 ENCOUNTER — Other Ambulatory Visit: Payer: Self-pay | Admitting: Family Medicine

## 2021-07-10 ENCOUNTER — Ambulatory Visit: Payer: 59 | Admitting: Family Medicine

## 2021-07-10 ENCOUNTER — Encounter: Payer: Self-pay | Admitting: Family Medicine

## 2021-07-10 VITALS — BP 130/80 | HR 85 | Temp 98.4°F | Ht 62.0 in | Wt 202.0 lb

## 2021-07-10 DIAGNOSIS — Z9989 Dependence on other enabling machines and devices: Secondary | ICD-10-CM

## 2021-07-10 DIAGNOSIS — E559 Vitamin D deficiency, unspecified: Secondary | ICD-10-CM | POA: Diagnosis not present

## 2021-07-10 DIAGNOSIS — H538 Other visual disturbances: Secondary | ICD-10-CM | POA: Diagnosis not present

## 2021-07-10 DIAGNOSIS — I10 Essential (primary) hypertension: Secondary | ICD-10-CM

## 2021-07-10 DIAGNOSIS — R5383 Other fatigue: Secondary | ICD-10-CM | POA: Diagnosis not present

## 2021-07-10 DIAGNOSIS — G4733 Obstructive sleep apnea (adult) (pediatric): Secondary | ICD-10-CM

## 2021-07-10 DIAGNOSIS — R7303 Prediabetes: Secondary | ICD-10-CM | POA: Diagnosis not present

## 2021-07-10 DIAGNOSIS — H548 Legal blindness, as defined in USA: Secondary | ICD-10-CM

## 2021-07-10 LAB — COMPREHENSIVE METABOLIC PANEL
ALT: 24 U/L (ref 0–35)
AST: 24 U/L (ref 0–37)
Albumin: 4.4 g/dL (ref 3.5–5.2)
Alkaline Phosphatase: 64 U/L (ref 39–117)
BUN: 17 mg/dL (ref 6–23)
CO2: 27 mEq/L (ref 19–32)
Calcium: 10.4 mg/dL (ref 8.4–10.5)
Chloride: 104 mEq/L (ref 96–112)
Creatinine, Ser: 0.9 mg/dL (ref 0.40–1.20)
GFR: 66 mL/min (ref >60.00)
Glucose, Bld: 83 mg/dL (ref 70–99)
Potassium: 3.7 mEq/L (ref 3.5–5.1)
Sodium: 139 mEq/L (ref 135–145)
Total Bilirubin: 0.4 mg/dL (ref 0.2–1.2)
Total Protein: 7.3 g/dL (ref 6.0–8.3)

## 2021-07-10 LAB — CBC WITH DIFFERENTIAL/PLATELET
Basophils Absolute: 0.1 10*3/uL (ref 0.0–0.1)
Basophils Relative: 1 % (ref 0.0–3.0)
Eosinophils Absolute: 0.3 10*3/uL (ref 0.0–0.7)
Eosinophils Relative: 5.2 % — ABNORMAL HIGH (ref 0.0–5.0)
HCT: 39.6 % (ref 36.0–46.0)
Hemoglobin: 13.1 g/dL (ref 12.0–15.0)
Lymphocytes Relative: 31 % (ref 12.0–46.0)
Lymphs Abs: 1.9 10*3/uL (ref 0.7–4.0)
MCHC: 33.1 g/dL (ref 30.0–36.0)
MCV: 91.6 fl (ref 78.0–100.0)
Monocytes Absolute: 0.5 10*3/uL (ref 0.1–1.0)
Monocytes Relative: 8 % (ref 3.0–12.0)
Neutro Abs: 3.3 10*3/uL (ref 1.4–7.7)
Neutrophils Relative %: 54.8 % (ref 43.0–77.0)
Platelets: 390 10*3/uL (ref 150.0–400.0)
RBC: 4.32 Mil/uL (ref 3.87–5.11)
RDW: 13.4 % (ref 11.5–15.5)
WBC: 6.1 10*3/uL (ref 4.0–10.5)

## 2021-07-10 LAB — VITAMIN D 25 HYDROXY (VIT D DEFICIENCY, FRACTURES): VITD: 23.07 ng/mL — ABNORMAL LOW (ref 30.00–100.00)

## 2021-07-10 LAB — T4, FREE: Free T4: 0.86 ng/dL (ref 0.60–1.60)

## 2021-07-10 LAB — TSH: TSH: 1.25 u[IU]/mL (ref 0.35–5.50)

## 2021-07-10 LAB — HEMOGLOBIN A1C: Hgb A1c MFr Bld: 5.9 % (ref 4.6–6.5)

## 2021-07-10 LAB — VITAMIN B12: Vitamin B-12: 327 pg/mL (ref 211–911)

## 2021-07-10 NOTE — Progress Notes (Signed)
Subjective:    Patient ID: Rebecca [name], female    DOB: Feb 06, 1956, 66 y.o.   MRN: [id number]  Chief Complaint  Patient presents with   Fatigue    Wondering if vitamin D may be low, she does take a vitamin D supplement.     HPI Patient was seen today for ongoing concern.  Patient endorses increased fatigue x3 weeks.  Patient states her sleep is okay for the most part.  Occasionally endorses tossing and turning at night as mind races about different things.  Patient wears CPAP nightly for history of OSA.  Taking OTC vitamin D supplement.  Patient states she is not drinking any water throughout the day as it causes increased urination. Pt with increased blurred vision.  States she is legally blind in L eye s/p "herpes infection and a surgery to remove damage".  Past Medical History:  Diagnosis Date   Asthma    Blindness, legal    L EYE   Hearing loss    Hyperlipidemia    Hypertension    Lapband May 2013 11/18/2011   Migraines    Obesity, Class III, BMI 40-49.9 (morbid obesity) ([place]) 06/03/2011   Sleep apnea    uses c-pap    Allergies  Allergen Reactions   Hydrocodone Hives, Shortness Of Breath and Itching   Sulfa Antibiotics Hives    ROS General: Denies fever, chills, night sweats, changes in weight, changes in appetite+fatigue HEENT: Denies headaches, ear pain, changes in vision, rhinorrhea, sore throat +blurred vision CV: Denies CP, palpitations, SOB, orthopnea Pulm: Denies SOB, cough, wheezing GI: Denies abdominal pain, nausea, vomiting, diarrhea, constipation GU: Denies dysuria, hematuria, frequency, vaginal discharge Msk: Denies muscle cramps, joint pains Neuro: Denies weakness, numbness, tingling Skin: Denies rashes, bruising Psych: Denies depression, anxiety, hallucinations  +insomnia, mind racing at night     Objective:    Blood pressure 130/80, pulse 85, temperature 98.4 F (36.9 C), temperature source Oral, height 5\' 2"  (1.575 m), weight 202 lb (91.6 kg), SpO2  98 %.  Gen. Pleasant, well-nourished, in no distress, normal affect   HEENT: [place]/AT, face symmetric, conjunctiva clear, no scleral icterus, PERRLA, EOMI, nares patent without drainage Lungs: no accessory muscle use Cardiovascular: RRR, no peripheral edema Musculoskeletal: No deformities, no cyanosis or clubbing, normal tone Neuro:  A&Ox3, CN II-XII intact, normal gait Skin:  Warm, no lesions/ rash   Wt Readings from Last 3 Encounters:  07/10/21 202 lb (91.6 kg)  04/19/21 207 lb (93.9 kg)  11/27/20 204 lb 9.6 oz (92.8 kg)    Lab Results  Component Value Date   WBC 11.4 (H) 04/19/2021   HGB 13.2 04/19/2021   HCT 38.8 04/19/2021   PLT 408 (H) 04/19/2021   GLUCOSE 100 (H) 04/19/2021   CHOL 228 (H) 11/27/2020   TRIG 148.0 11/27/2020   HDL 45.10 11/27/2020   LDLDIRECT 147.4 01/11/2013   LDLCALC 153 (H) 11/27/2020   ALT 20 04/19/2021   AST 19 04/19/2021   NA 138 04/19/2021   K 3.4 (L) 04/19/2021   CL 105 04/19/2021   CREATININE 0.86 04/19/2021   BUN 21 04/19/2021   CO2 27 04/19/2021   TSH 0.93 11/27/2020   HGBA1C 6.1 11/27/2020    Assessment/Plan:  Fatigue, unspecified type  -Discussed various possible causes including dehydration, vitamin deficiency, electrolyte deficiency, thyroid dysfunction -Patient encouraged to increase p.o. intake of water and physical activity -Continue wearing CPAP nightly - Plan: TSH, T4, Free, Vitamin D, 25-hydroxy, CMP, CBC with Differential/Platelet, Hemoglobin A1c, Vitamin  B12  Prediabetes  -Hemoglobin A1c 6.1% on 11/27/2020 -Lifestyle modifications encouraged - Plan: Hemoglobin A1c  Essential hypertension  -slight elevation -Patient encouraged to increase p.o. intake of water -Lifestyle modifications encouraged -Continue current medications including lisinopril 30 mg daily, hydrochlorothiazide 12.5 mg - Plan: CMP  Vitamin D deficiency  - Plan: Vitamin D, 25-hydroxy  Blurred vision -Discussed possible causes including needing  glasses, elevated blood sugar -vision check this visit - Plan: Hemoglobin A1c  Legally blind in left eye, as defined in [place] -Stable -Follow-up with ophthalmology encouraged  OSA on CPAP -Continue wearing CPAP nightly  F/u prn in 1-2 months for HTN  More than 50% of over 31-33 minutes spent in total in caring for this patient was spent face-to-face, reviewing the chart, counseling and/or coordinating care.    Grier Mitts, MD

## 2021-07-10 NOTE — Patient Instructions (Signed)
Work on increasing your intake of water daily.

## 2021-07-11 ENCOUNTER — Other Ambulatory Visit: Payer: Self-pay | Admitting: Family Medicine

## 2021-07-11 DIAGNOSIS — E559 Vitamin D deficiency, unspecified: Secondary | ICD-10-CM

## 2021-07-11 MED ORDER — VITAMIN D (ERGOCALCIFEROL) 1.25 MG (50000 UNIT) PO CAPS: 50000.0000 [IU] | ORAL_CAPSULE | ORAL | 0 refills | Status: DC

## 2021-07-14 ENCOUNTER — Inpatient Hospital Stay: Admission: RE | Admit: 2021-07-14 | Payer: 59 | Source: Ambulatory Visit

## 2021-07-16 NOTE — Telephone Encounter (Signed)
error 

## 2021-07-25 ENCOUNTER — Other Ambulatory Visit: Payer: Self-pay

## 2021-07-25 ENCOUNTER — Ambulatory Visit (INDEPENDENT_AMBULATORY_CARE_PROVIDER_SITE_OTHER)
Admission: RE | Admit: 2021-07-25 | Discharge: 2021-07-25 | Disposition: A | Discharge status: Home / Self Care | Payer: 59 | Source: Ambulatory Visit | Attending: Family Medicine | Admitting: Family Medicine

## 2021-07-25 DIAGNOSIS — R1906 Epigastric swelling, mass or lump: Secondary | ICD-10-CM | POA: Diagnosis not present

## 2021-07-29 ENCOUNTER — Encounter: Payer: Self-pay | Admitting: Family Medicine

## 2021-08-01 ENCOUNTER — Other Ambulatory Visit: Payer: Self-pay | Admitting: *Deleted

## 2021-08-01 ENCOUNTER — Other Ambulatory Visit: Payer: Self-pay | Admitting: Allergy

## 2021-08-01 ENCOUNTER — Ambulatory Visit (INDEPENDENT_AMBULATORY_CARE_PROVIDER_SITE_OTHER): Payer: 59 | Admitting: Allergy

## 2021-08-01 ENCOUNTER — Other Ambulatory Visit: Payer: Self-pay

## 2021-08-01 ENCOUNTER — Encounter: Payer: Self-pay | Admitting: Allergy

## 2021-08-01 DIAGNOSIS — J4540 Moderate persistent asthma, uncomplicated: Secondary | ICD-10-CM

## 2021-08-01 DIAGNOSIS — J310 Chronic rhinitis: Secondary | ICD-10-CM | POA: Diagnosis not present

## 2021-08-01 MED ORDER — ALBUTEROL SULFATE HFA 108 (90 BASE) MCG/ACT IN AERS: INHALATION_SPRAY | RESPIRATORY_TRACT | 1 refills | Status: DC

## 2021-08-01 MED ORDER — ALVESCO 160 MCG/ACT IN AERS: 1.0000 | INHALATION_SPRAY | Freq: Two times a day (BID) | RESPIRATORY_TRACT | 2 refills | Status: DC

## 2021-08-01 MED ORDER — RYALTRIS 665-25 MCG/ACT NA SUSP: 2.0000 | Freq: Two times a day (BID) | NASAL | 2 refills | Status: DC

## 2021-08-01 MED ORDER — CETIRIZINE HCL 10 MG PO TABS: 10.0000 mg | ORAL_TABLET | Freq: Every day | ORAL | 5 refills | Status: DC

## 2021-08-01 MED ORDER — MONTELUKAST SODIUM 10 MG PO TABS: ORAL_TABLET | ORAL | 1 refills | Status: DC

## 2021-08-01 NOTE — Patient Instructions (Addendum)
Asthma - not well controlled at this time - continue Singulair 10mg  daily - start Alvesco 125mcg 2 puffs twice a day with spacer device. This is maintenance asthma inhaler to be taken daily - have access to Albuterol inhaler 2 puffs every 4-6 hours as needed for cough, wheeze, shortness of breath, chest tightness.   Asthma control goals:  Full participation in all desired activities (may need albuterol before activity) Albuterol use two time or less a week on average (not counting use with activity) Cough interfering with sleep two time or less a month Oral steroids no more than once a year No hospitalizations  Mixed rhinitis - try Ryaltris 2 sprays each nostril twice a day at this time.  This is a combination nasal spray with nasal steroid (for congestion control) and nasal antihistamine (for drainage control). Sample provided.   - use Zyrtec 10mg  daily at this time - Saline nasal wash each evening in the shower and as needed.  Follow-up in 3-4 months or sooner if needed.

## 2021-08-01 NOTE — Progress Notes (Signed)
Follow-up Note  RE: Rebecca [name] MRN: [id number] DOB: [birth year] Date of Office Visit: 08/01/2021   History of present illness: Rebecca [name] is a 66 y.o. female presenting today for sick visit.  She has history of asthma and admits rhinitis.  She was last seen in the office 11/15/2019 by myself.  She states she has been having issues for past 2 weeks and states having coughing, shortness or breath, hoarseness, runny nose, throat clearing and watery eyes.  Has also had few headaches and occasional wheeze.  She has tried Mucinex, tylenol to ehlp with symptoms.  She has been taking her montelukast daily.  She states she is out of all of her inhaled medicines.  She does not have a rescue inhaler nor does she have any Dulera to use.  She also does not have any nasal sprays or Zyrtec that she has been using previously.  Review of systems: Review of Systems  Constitutional: Negative.   HENT:  Positive for postnasal drip and voice change.   Eyes:  Positive for discharge.  Respiratory:  Positive for cough and shortness of breath.   Cardiovascular: Negative.   Gastrointestinal: Negative.   Musculoskeletal: Negative.   Skin: Negative.   Allergic/Immunologic: Negative.   Neurological: Negative.     All other systems negative unless noted above in HPI  Past medical/social/surgical/family history have been reviewed and are unchanged unless specifically indicated below.  No changes  Medication List: Current Outpatient Medications  Medication Sig Dispense Refill   atorvastatin (LIPITOR) 20 MG tablet TAKE 1 TABLET BY MOUTH EVERY DAY (Patient not taking: Reported on 08/01/2021) 90 tablet 0   hydrochlorothiazide (HYDRODIURIL) 12.5 MG tablet TAKE 1 TABLET BY MOUTH EVERY DAY (Patient taking differently: Take 12.5 mg by mouth daily.) 90 tablet 1   lisinopril (ZESTRIL) 30 MG tablet TAKE 1 TABLET BY MOUTH EVERY DAY (Patient taking differently: Take 30 mg by mouth daily.) 90 tablet 1   Multiple  Vitamins-Minerals (MULTIVITAMIN WITH MINERALS) tablet Take 1 tablet by mouth daily with breakfast.     Olopatadine-Mometasone (RYALTRIS) 665-25 MCG/ACT SUSP Place 2 sprays into both nostrils 2 (two) times daily. 29 g 2   Vitamin D, Ergocalciferol, (DRISDOL) 1.25 MG (50000 UNIT) CAPS capsule Take 1 capsule (50,000 Units total) by mouth every 7 (seven) days. (Patient taking differently: Take 50,000 Units by mouth every 7 (seven) days. Friday) 12 capsule 0   albuterol (PROVENTIL) (2.5 MG/3ML) 0.083% nebulizer solution Take 3 mLs (2.5 mg total) by nebulization every 4 (four) hours as needed for wheezing or shortness of breath. 75 mL 1   albuterol (VENTOLIN HFA) 108 (90 Base) MCG/ACT inhaler Use 2 puffs every 4 hours as needed for cough or wheeze.  May use  2 puffs 10-20 minutes prior to exercise. 18 g 1   ALVESCO 160 MCG/ACT inhaler Inhale 1 puff into the lungs 2 (two) times daily. 1 each 2   cetirizine (ZYRTEC ALLERGY) 10 MG tablet Take 1 tablet (10 mg total) by mouth daily. 30 tablet 5   ibuprofen (ADVIL) 200 MG tablet Take 400 mg by mouth every 6 (six) hours as needed for headache or mild pain.     meclizine (ANTIVERT) 25 MG tablet Take 1 tablet (25 mg total) by mouth 3 (three) times daily as needed for dizziness. 30 tablet 0   montelukast (SINGULAIR) 10 MG tablet TAKE 1 TABLET BY MOUTH EVERYDAY AT BEDTIME 90 tablet 1   No current facility-administered medications for this visit.  Known medication allergies: Allergies  Allergen Reactions   Hydrocodone Hives, Shortness Of Breath and Itching   Sulfa Antibiotics Hives     Physical examination: Blood pressure 134/82, pulse 88, temperature 97.9 F (36.6 C), resp. rate 18, height 5\' 2"  (1.575 m), weight 203 lb 2 oz (92.1 kg), SpO2 96 %.  General: Alert, interactive, in no acute distress. HEENT: PERRLA, TMs pearly gray, turbinates mildly edematous with clear discharge, post-pharynx non erythematous. Neck: Supple without  lymphadenopathy. Lungs: Clear to auscultation without wheezing, rhonchi or rales. {no increased work of breathing. CV: Normal S1, S2 without murmurs. Abdomen: Nondistended, nontender. Skin: Warm and dry, without lesions or rashes. Extremities:  No clubbing, cyanosis or edema. Neuro:   Grossly intact.  Diagnositics/Labs:  Spirometry: FEV1: 1.19L 69%, FVC: 1.57L 71% predicted.  Status post albuterol she had an increase in FEV1 to 1.29% was 75% with a percent increase.  This is not significant but is pretty much takes her to be normal and obstructive pattern for her age/demographic  Assessment and plan:   Asthma - not well controlled at this time likely due to tree pollen allergen exposure - continue Singulair 10mg  daily - start Alvesco 147mcg 2 puffs twice a day with spacer device. This is maintenance asthma inhaler to be taken daily - have access to Albuterol inhaler 2 puffs every 4-6 hours as needed for cough, wheeze, shortness of breath, chest tightness.   Asthma control goals:  Full participation in all desired activities (may need albuterol before activity) Albuterol use two time or less a week on average (not counting use with activity) Cough interfering with sleep two time or less a month Oral steroids no more than once a year No hospitalizations  Mixed rhinitis - increased symptoms likely due to tree pollen allergy exposure - try Ryaltris 2 sprays each nostril twice a day at this time.  This is a combination nasal spray with nasal steroid (for congestion control) and nasal antihistamine (for drainage control). Sample provided.   - use Zyrtec 10mg  daily at this time - Saline nasal wash each evening in the shower and as needed.  Follow-up in 3-4 months or sooner if needed.  I appreciate the opportunity to take part in Rebecca [name]'s care. Please do not hesitate to contact me with questions.  Sincerely,   Prudy Feeler, MD Allergy/Immunology [facility]  [place]

## 2021-08-06 NOTE — Patient Instructions (Addendum)
DUE TO COVID-19 ONLY ONE VISITOR IS ALLOWED TO COME WITH YOU AND STAY IN THE WAITING ROOM ONLY DURING PRE OP AND PROCEDURE.   **NO VISITORS ARE ALLOWED IN THE SHORT STAY AREA OR RECOVERY ROOM!!**  IF YOU WILL BE ADMITTED INTO THE HOSPITAL YOU ARE ALLOWED ONLY TWO SUPPORT PEOPLE DURING VISITATION HOURS ONLY (7 AM -8PM)   The support person(s) must pass our screening, gel in and out, and wear a mask at all times, including in the patients room. Patients must also wear a mask when staff or their support person are in the room. Visitors GUEST BADGE MUST BE WORN VISIBLY  One adult visitor may remain with you overnight and MUST be in the room by 8 P.M.  No visitors under the age of 92. Any visitor under the age of 40 must be accompanied by an adult.    COVID SWAB TESTING MUST BE COMPLETED ON:  08/14/21 @ 12:00 PM   Site: [facility] [place] Enter: Main Entrance have a seat in the waiting area to the right of main entrance (DO NOT [place]!!!!!) Dial: [phone] to alert staff you have arrived  You are not required to quarantine, however you are required to wear a well-fitted mask when you are out and around people not in your household.  Hand Hygiene often Do NOT share personal items Notify your provider if you are in close contact with someone who has COVID or you develop fever 100.4 or greater, new onset of sneezing, cough, sore throat, shortness of breath or body aches.       Your procedure is scheduled on: 08/18/21   Report to [facility] Main Entrance    Report to admitting at 8:50 AM   Call this number if you have problems the morning of surgery [phone]   Do not eat food :After Midnight.   After Midnight you may have the following liquids until 8:30 AM DAY OF SURGERY  Water Black Coffee (sugar ok, NO MILK/CREAM OR CREAMERS)  Tea (sugar ok, NO MILK/CREAM OR CREAMERS) regular and decaf                             Plain  Jell-O (NO RED)                                           Fruit ices (not with fruit pulp, NO RED)                                     Popsicles (NO RED)                                                                  Juice: apple, WHITE grape, WHITE cranberry Sports drinks like Gatorade (NO RED) Clear broth(vegetable,chicken,beef)      The day of surgery:  Drink ONE (1) Pre-Surgery G2 at 8:30 AM the morning of surgery. Drink in one sitting. Do not sip.  This drink was given to  you during your hospital  pre-op appointment visit. Nothing else to drink after completing the  Pre-Surgery G2.          If you have questions, please contact your surgeons office.   FOLLOW BOWEL PREP AND ANY ADDITIONAL PRE OP INSTRUCTIONS YOU RECEIVED FROM YOUR SURGEON'S OFFICE!!!     Oral Hygiene is also important to reduce your risk of infection.                                    Remember - BRUSH YOUR TEETH THE MORNING OF SURGERY WITH YOUR REGULAR TOOTHPASTE   Stop all vitamins and supplements 7 days before surgery.   Take these medicines the morning of surgery with A SIP OF WATER: Inhalers, Nebulizer, Zyrtec                              You may not have any metal on your body including hair pins, jewelry, and body piercing             Do not wear make-up, lotions, powders, perfumes, or deodorant  Do not wear nail polish including gel and S&S, artificial/acrylic nails, or any other type of covering on natural nails including finger and toenails. If you have artificial nails, gel coating, etc. that needs to be removed by a nail salon please have this removed prior to surgery or surgery may need to be canceled/ delayed if the surgeon/ anesthesia feels like they are unable to be safely monitored.   Do not shave  48 hours prior to surgery.    Do not bring valuables to the hospital. [place].   Bring CPAP mask and tubing day of surgery   Bring small  overnight bag day of surgery.   Special Instructions: Bring a copy of your healthcare power of attorney and living will documents         the day of surgery if you haven't scanned them before.              Please read over the following fact sheets you were given: IF YOU HAVE QUESTIONS ABOUT YOUR PRE-OP INSTRUCTIONS PLEASE CALL [facility] - Preparing for Surgery Before surgery, you can play an important role.  Because skin is not sterile, your skin needs to be as free of germs as possible.  You can reduce the number of germs on your skin by washing with CHG (chlorahexidine gluconate) soap before surgery.  CHG is an antiseptic cleaner which kills germs and bonds with the skin to continue killing germs even after washing. Please DO NOT use if you have an allergy to CHG or antibacterial soaps.  If your skin becomes reddened/irritated stop using the CHG and inform your nurse when you arrive at Short Stay. Do not shave (including legs and underarms) for at least 48 hours prior to the first CHG shower.  You may shave your face/neck.  Please follow these instructions carefully:  1.  Shower with CHG Soap the night before surgery and the  morning of surgery.  2.  If you choose to wash your hair, wash your hair first as usual with your normal  shampoo.  3.  After you shampoo, rinse your hair and  body thoroughly to remove the shampoo.                             4.  Use CHG as you would any other liquid soap.  You can apply chg directly to the skin and wash.  Gently with a scrungie or clean washcloth.  5.  Apply the CHG Soap to your body ONLY FROM THE NECK DOWN.   Do   not use on face/ open                           Wound or open sores. Avoid contact with eyes, ears mouth and   genitals (private parts).                       Wash face,  Genitals (private parts) with your normal soap.             6.  Wash thoroughly, paying special attention to the area where your    surgery  will be  performed.  7.  Thoroughly rinse your body with warm water from the neck down.  8.  DO NOT shower/wash with your normal soap after using and rinsing off the CHG Soap.                9.  Pat yourself dry with a clean towel.            10.  Wear clean pajamas.            11.  Place clean sheets on your bed the night of your first shower and do not  sleep with pets. Day of Surgery : Do not apply any lotions/deodorants the morning of surgery.  Please wear clean clothes to the hospital/surgery center.  FAILURE TO FOLLOW THESE INSTRUCTIONS MAY RESULT IN THE CANCELLATION OF YOUR SURGERY  PATIENT SIGNATURE_________________________________  NURSE SIGNATURE__________________________________  ________________________________________________________________________   Adam Phenix  An incentive spirometer is a tool that can help keep your lungs clear and active. This tool measures how well you are filling your lungs with each breath. Taking long deep breaths may help reverse or decrease the chance of developing breathing (pulmonary) problems (especially infection) following: A long period of time when you are unable to move or be active. BEFORE THE PROCEDURE  If the spirometer includes an indicator to show your best effort, your nurse or respiratory therapist will set it to a desired goal. If possible, sit up straight or lean slightly forward. Try not to slouch. Hold the incentive spirometer in an upright position. INSTRUCTIONS FOR USE  Sit on the edge of your bed if possible, or sit up as far as you can in bed or on a chair. Hold the incentive spirometer in an upright position. Breathe out normally. Place the mouthpiece in your mouth and seal your lips tightly around it. Breathe in slowly and as deeply as possible, raising the piston or the ball toward the top of the column. Hold your breath for 3-5 seconds or for as long as possible. Allow the piston or ball to fall to the bottom of the  column. Remove the mouthpiece from your mouth and breathe out normally. Rest for a few seconds and repeat Steps 1 through 7 at least 10 times every 1-2 hours when you are awake. Take your time and take a few normal breaths between deep breaths. The  spirometer may include an indicator to show your best effort. Use the indicator as a goal to work toward during each repetition. After each set of 10 deep breaths, practice coughing to be sure your lungs are clear. If you have an incision (the cut made at the time of surgery), support your incision when coughing by placing a pillow or rolled up towels firmly against it. Once you are able to get out of bed, walk around indoors and cough well. You may stop using the incentive spirometer when instructed by your caregiver.  RISKS AND COMPLICATIONS Take your time so you do not get dizzy or light-headed. If you are in pain, you may need to take or ask for pain medication before doing incentive spirometry. It is harder to take a deep breath if you are having pain. AFTER USE Rest and breathe slowly and easily. It can be helpful to keep track of a log of your progress. Your caregiver can provide you with a simple table to help with this. If you are using the spirometer at home, follow these instructions: [place] IF:  You are having difficultly using the spirometer. You have trouble using the spirometer as often as instructed. Your pain medication is not giving enough relief while using the spirometer. You develop fever of 100.5 F (38.1 C) or higher. SEEK IMMEDIATE MEDICAL CARE IF:  You cough up bloody sputum that had not been present before. You develop fever of 102 F (38.9 C) or greater. You develop worsening pain at or near the incision site. MAKE SURE YOU:  Understand these instructions. Will watch your condition. Will get help right away if you are not doing well or get worse. Document Released: 10/12/2006 Document Revised: 08/24/2011  Document Reviewed: 12/13/2006 [facility] Patient Information 2014 [place], [place].   ________________________________________________________________________

## 2021-08-06 NOTE — Progress Notes (Addendum)
COVID swab appointment: 08/14/21 @ 1200  COVID Vaccine Completed: yes x3 Date COVID Vaccine completed: 06/29/19, 08/09/19 Has received booster: 03/04/20 COVID vaccine manufacturer: Pfizer      Date of COVID positive in last 90 days: no  PCP - Grier Mitts, MD Cardiologist - n/a  Chest x-ray - n/a EKG - 04/21/21 Epic Stress Test - many years ago per pt ECHO - n/a Cardiac Cath - n/a Pacemaker/ICD device last checked: n/a Spinal Cord Stimulator: n/a  Bowel Prep - no  Sleep Study - yes, positive CPAP - yes every night , will bring mask and tubing   Fasting Blood Sugar - pre DM, no checks at home Checks Blood Sugar _____ times a day  Blood Thinner Instructions: n/a Aspirin Instructions: Last Dose:  Activity level: Can go up a flight of stairs and perform activities of daily living without stopping and without symptoms of chest pain. SOB due to asthma    Anesthesia review: HTN, OSA, DM 2  Patient denies shortness of breath, fever, cough and chest pain at PAT appointment   Patient verbalized understanding of instructions that were given to them at the PAT appointment. Patient was also instructed that they will need to review over the PAT instructions again at home before surgery.

## 2021-08-07 ENCOUNTER — Encounter (HOSPITAL_COMMUNITY)
Admission: RE | Admit: 2021-08-07 | Discharge: 2021-08-07 | Disposition: A | Discharge status: Home / Self Care | Payer: 59 | Source: Ambulatory Visit | Attending: Pulmonary Disease | Admitting: Pulmonary Disease

## 2021-08-07 ENCOUNTER — Encounter (HOSPITAL_COMMUNITY): Payer: Self-pay

## 2021-08-07 ENCOUNTER — Other Ambulatory Visit: Payer: Self-pay | Admitting: *Deleted

## 2021-08-07 ENCOUNTER — Other Ambulatory Visit: Payer: Self-pay

## 2021-08-07 VITALS — BP 172/84 | HR 76 | Temp 98.7°F | Resp 20 | Ht 62.0 in | Wt 198.8 lb

## 2021-08-07 DIAGNOSIS — Z01812 Encounter for preprocedural laboratory examination: Secondary | ICD-10-CM | POA: Diagnosis present

## 2021-08-07 DIAGNOSIS — M1711 Unilateral primary osteoarthritis, right knee: Secondary | ICD-10-CM | POA: Insufficient documentation

## 2021-08-07 DIAGNOSIS — Z01818 Encounter for other preprocedural examination: Secondary | ICD-10-CM

## 2021-08-07 HISTORY — DX: Personal history of urinary calculi: Z87.442

## 2021-08-07 HISTORY — DX: Gastro-esophageal reflux disease without esophagitis: K21.9

## 2021-08-07 HISTORY — DX: Unspecified osteoarthritis, unspecified site: M19.90

## 2021-08-07 HISTORY — DX: Prediabetes: R73.03

## 2021-08-07 LAB — COMPREHENSIVE METABOLIC PANEL
ALT: 23 U/L (ref 0–44)
AST: 22 U/L (ref 15–41)
Albumin: 4.1 g/dL (ref 3.5–5.0)
Alkaline Phosphatase: 57 U/L (ref 38–126)
Anion gap: 6 (ref 5–15)
BUN: 17 mg/dL (ref 8–23)
CO2: 26 mmol/L (ref 22–32)
Calcium: 9.4 mg/dL (ref 8.9–10.3)
Chloride: 107 mmol/L (ref 98–111)
Creatinine, Ser: 0.84 mg/dL (ref 0.44–1.00)
GFR, Estimated: >60 mL/min (ref >60)
Glucose, Bld: 92 mg/dL (ref 70–99)
Potassium: 4 mmol/L (ref 3.5–5.1)
Sodium: 139 mmol/L (ref 135–145)
Total Bilirubin: 0.4 mg/dL (ref 0.3–1.2)
Total Protein: 7.2 g/dL (ref 6.5–8.1)

## 2021-08-07 LAB — GLUCOSE, CAPILLARY: Glucose-Capillary: 91 mg/dL (ref 70–99)

## 2021-08-07 LAB — CBC
HCT: 36.9 % (ref 36.0–46.0)
Hemoglobin: 12.4 g/dL (ref 12.0–15.0)
MCH: 31 pg (ref 26.0–34.0)
MCHC: 33.6 g/dL (ref 30.0–36.0)
MCV: 92.3 fL (ref 80.0–100.0)
Platelets: 402 10*3/uL — ABNORMAL HIGH (ref 150–400)
RBC: 4 MIL/uL (ref 3.87–5.11)
RDW: 12.6 % (ref 11.5–15.5)
WBC: 7.2 10*3/uL (ref 4.0–10.5)
nRBC: 0 % (ref 0.0–0.2)

## 2021-08-07 LAB — SURGICAL PCR SCREEN
MRSA, PCR: NEGATIVE
Staphylococcus aureus: NEGATIVE

## 2021-08-07 LAB — PROTIME-INR
INR: 1 (ref 0.8–1.2)
Prothrombin Time: 12.9 seconds (ref 11.4–15.2)

## 2021-08-07 MED ORDER — RYALTRIS 665-25 MCG/ACT NA SUSP: 2.0000 | Freq: Two times a day (BID) | NASAL | 2 refills | Status: DC

## 2021-08-13 NOTE — H&P (Signed)
TOTAL KNEE ADMISSION H&P  Patient is being admitted for right total knee arthroplasty.  Subjective:  Chief Complaint: Right knee pain.  HPI: Rebecca [name], 66 y.o. female has a history of pain and functional disability in the right knee due to arthritis and has failed non-surgical conservative treatments for greater than 12 weeks to include NSAID's and/or analgesics, flexibility and strengthening excercises, and activity modification. Onset of symptoms was gradual, starting  several  years ago with gradually worsening course since that time. The patient noted no past surgery on the right knee.  Patient currently rates pain in the right knee at 7 out of 10 with activity. Patient has worsening of pain with activity and weight bearing, pain that interferes with activities of daily living, pain with passive range of motion, and crepitus. Patient has evidence of periarticular osteophytes and joint space narrowing by imaging studies. There is no active infection.  Patient Active Problem List   Diagnosis Date Noted   Prediabetes 12/29/2019   Vitamin D deficiency 12/29/2019   Class 2 obesity due to excess calories with serious comorbidity and body mass index (BMI) of 38.0 to 38.9 in adult 08/31/2016   Hyperlipidemia 05/04/2016   Essential hypertension, benign 12/26/2012   Asthma 12/26/2012   OSA (obstructive sleep apnea) 10/05/2011    Past Medical History:  Diagnosis Date   Arthritis    Asthma    Blindness, legal    L EYE   GERD (gastroesophageal reflux disease)    Hearing loss    History of kidney stones    Hyperlipidemia    Hypertension    Lapband May 2013 11/18/2011   Migraines    Obesity, Class III, BMI 40-49.9 (morbid obesity) ([place]) 06/03/2011   Pre-diabetes    Sleep apnea    uses c-pap    Past Surgical History:  Procedure Laterality Date   ABDOMINAL HYSTERECTOMY  2006   fibroids   BREATH TEK H PYLORI  08/17/2011   Procedure: BREATH TEK H PYLORI;  Surgeon: Pedro Earls,  MD;  Location: [facility];  Service: General;  Laterality: N/A;  PATIENT WILL COME AT 0715   COLONOSCOPY  2008   @ [place]     Patient unsure of surgery date. Left eye   KNEE SURGERY  1992   right   LAPAROSCOPIC GASTRIC BANDING  10/20/2011   Procedure: LAPAROSCOPIC GASTRIC BANDING;  Surgeon: Pedro Earls, MD;  Location: [facility];  Service: General;  Laterality: N/A;    Prior to Admission medications   Medication Sig Start Date End Date Taking? Authorizing Provider  albuterol (PROVENTIL) (2.5 MG/3ML) 0.083% nebulizer solution Take 3 mLs (2.5 mg total) by nebulization every 4 (four) hours as needed for wheezing or shortness of breath. 11/15/19  Yes Padgett, Rae Halsted, MD  hydrochlorothiazide (HYDRODIURIL) 12.5 MG tablet TAKE 1 TABLET BY MOUTH EVERY DAY Patient taking differently: Take 12.5 mg by mouth daily. 12/02/20  Yes Billie Ruddy, MD  ibuprofen (ADVIL) 200 MG tablet Take 400 mg by mouth every 6 (six) hours as needed for headache or mild pain.   Yes [provider]  lisinopril (ZESTRIL) 30 MG tablet TAKE 1 TABLET BY MOUTH EVERY DAY Patient taking differently: Take 30 mg by mouth daily. 05/15/21  Yes Billie Ruddy, MD  Multiple Vitamins-Minerals (MULTIVITAMIN WITH MINERALS) tablet Take 1 tablet by mouth daily with breakfast.   Yes [provider]  Vitamin D, Ergocalciferol, (DRISDOL) 1.25 MG (50000 UNIT) CAPS capsule Take  1 capsule (50,000 Units total) by mouth every 7 (seven) days. Patient taking differently: Take 50,000 Units by mouth every 7 (seven) days. Friday 07/11/21  Yes Billie Ruddy, MD  albuterol (VENTOLIN HFA) 108 (90 Base) MCG/ACT inhaler Use 2 puffs every 4 hours as needed for cough or wheeze.  May use  2 puffs 10-20 minutes prior to exercise. 08/01/21   Kennith Gain, MD  ALVESCO 160 MCG/ACT inhaler Inhale 1 puff into the lungs 2 (two) times daily. 08/01/21   Kennith Gain, MD  atorvastatin (LIPITOR) 20 MG  tablet TAKE 1 TABLET BY MOUTH EVERY DAY Patient not taking: Reported on 08/01/2021 04/29/19   Billie Ruddy, MD  cetirizine (ZYRTEC ALLERGY) 10 MG tablet Take 1 tablet (10 mg total) by mouth daily. 08/01/21   Kennith Gain, MD  meclizine (ANTIVERT) 25 MG tablet Take 1 tablet (25 mg total) by mouth 3 (three) times daily as needed for dizziness. 04/19/21   Deno Etienne, DO  montelukast (SINGULAIR) 10 MG tablet TAKE 1 TABLET BY MOUTH EVERYDAY AT BEDTIME 08/01/21   Kennith Gain, MD  RYALTRIS [phone] MCG/ACT SUSP Place 2 sprays into both nostrils 2 (two) times daily. 08/07/21   Kennith Gain, MD    Allergies  Allergen Reactions   Hydrocodone Hives, Shortness Of Breath and Itching   Sulfa Antibiotics Hives    Social History   Socioeconomic History   Marital status: Married    Spouse name: Not on file   Number of children: Not on file   Years of education: Not on file   Highest education level: Not on file  Occupational History   Not on file  Tobacco Use   Smoking status: Former    Packs/day: 0.50    Years: 15.00    Pack years: 7.50    Types: Cigarettes    Quit date: 06/02/1994    Years since quitting: 27.2   Smokeless tobacco: Never  Vaping Use   Vaping Use: Never used  Substance and Sexual Activity   Alcohol use: No   Drug use: No   Sexual activity: Not on file  Other Topics Concern   Not on file  Social History Narrative   Work or School: works part time with Physicist, 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       Home Situation: lives with husband      Spiritual Beliefs: Christian  - Education administrator, Dentist      Lifestyle: wt watchers 2019, starting to exercise         Social Determinants of Radio broadcast assistant Strain: Not on file  Food Insecurity: Not on file  Transportation Needs: Not on file  Physical Activity: Not on file  Stress: Not on file  Social Connections: Not on file  Intimate Partner  Violence: Not on file    Tobacco Use: Medium Risk   Smoking Tobacco Use: Former   Smokeless Tobacco Use: Never   Passive Exposure: Not on file   Social History   Substance and Sexual Activity  Alcohol Use No    Family History  Problem Relation Age of Onset   Cancer Mother        breast   Breast cancer Mother    Alcohol abuse Father    Heart disease Father 21       MI   Hypertension Father    Colon cancer Neg Hx     ROS: Constitutional: no fever,  no chills, no night sweats, no significant weight loss Cardiovascular: no chest pain, no palpitations Respiratory: no cough, no shortness of breath, No COPD Gastrointestinal: no vomiting, no nausea Musculoskeletal: no swelling in Joints, Joint Pain Neurologic: no numbness, no tingling, no difficulty with balance   Objective:  Physical Exam: Well nourished and well developed.  General: Alert and oriented x3, cooperative and pleasant, no acute distress.  Head: normocephalic, atraumatic, neck supple.  Eyes: EOMI.  Respiratory: breath sounds clear in all fields, no wheezing, rales, or rhonchi. Cardiovascular: Regular rate and rhythm, no murmurs, gallops or rubs.  Abdomen: non-tender to palpation and soft, normoactive bowel sounds. Musculoskeletal:   The patient has a minimally antalgic gait pattern favoring the right side without the use of assistive devices.     Right Hip Exam:   The range of motion: normal without discomfort.     Right Knee Exam:   Slight varus deformity.   No effusion present. No swelling present.   The range of motion is: 0 to 125 degrees.   Moderate crepitus on range of motion of the knee.   Positive medial joint line tenderness.   No lateral joint line tenderness.   The knee is stable.  Calves soft and nontender. Motor function intact in LE. Strength 5/5 LE bilaterally. Neuro: Distal pulses 2+. Sensation to light touch intact in LE.   Vital signs in last 24 hours:    Imaging Review AP and  lateral of the right knee dated 03/2021 demonstrate bone-on-bone arthritis in the medial and patellofemoral compartments with large osteophyte formation.  Assessment/Plan:  End stage arthritis, right knee   The patient history, physical examination, clinical judgment of the provider and imaging studies are consistent with end stage degenerative joint disease of the right knee and total knee arthroplasty is deemed medically necessary. The treatment options including medical management, injection therapy arthroscopy and arthroplasty were discussed at length. The risks and benefits of total knee arthroplasty were presented and reviewed. The risks due to aseptic loosening, infection, stiffness, patella tracking problems, thromboembolic complications and other imponderables were discussed. The patient acknowledged the explanation, agreed to proceed with the plan and consent was signed. Patient is being admitted for inpatient treatment for surgery, pain control, PT, OT, prophylactic antibiotics, VTE prophylaxis, progressive ambulation and ADLs and discharge planning. The patient is planning to be discharged  home .   Patient's anticipated LOS is less than 2 midnights, meeting these requirements: - Younger than 78 - Lives within 1 hour of care - Has a competent adult at home to recover with post-op recover - NO history of  - Chronic pain requiring opiods  - Diabetes  - Coronary Artery Disease  - Heart failure  - Heart attack  - Stroke  - DVT/VTE  - Cardiac arrhythmia  - Respiratory Failure/COPD  - Renal failure  - Anemia  - Advanced Liver disease    Therapy Plans: EO in [place]  Disposition: Home with Son  Planned DVT Prophylaxis: Aspirin 325mg   DME Needed: None PCP: Grier Mitts, MD (clearance received) TXA: IV Allergies: Sulfa (Itching/Rash) Anesthesia Concerns: Sleep Apnea, on CPAP BMI: 37.8. Last HgbA1c: N/A  Pharm[facility]: [facility] [address] [facility] [place]  - Patient was instructed on  what medications to stop prior to surgery. - Follow-up visit in 2 weeks with Dr. Wynelle Link - Begin physical therapy following surgery - Pre-operative lab work as pre-surgical testing - Prescriptions will be provided in hospital at time of discharge  Fenton Foy, [facility], PA-C Orthopedic  Surgery [facility] [place]

## 2021-08-14 ENCOUNTER — Other Ambulatory Visit: Payer: Self-pay

## 2021-08-14 ENCOUNTER — Encounter (HOSPITAL_COMMUNITY)
Admission: RE | Admit: 2021-08-14 | Discharge: 2021-08-14 | Disposition: A | Discharge status: Home / Self Care | Payer: 59 | Source: Ambulatory Visit | Attending: Orthopedic Surgery | Admitting: Orthopedic Surgery

## 2021-08-14 DIAGNOSIS — Z01812 Encounter for preprocedural laboratory examination: Secondary | ICD-10-CM | POA: Diagnosis present

## 2021-08-14 DIAGNOSIS — Z01818 Encounter for other preprocedural examination: Secondary | ICD-10-CM

## 2021-08-14 DIAGNOSIS — Z20822 Contact with and (suspected) exposure to covid-19: Secondary | ICD-10-CM | POA: Diagnosis not present

## 2021-08-14 LAB — SARS CORONAVIRUS 2 (TAT 6-24 HRS): SARS Coronavirus 2: NEGATIVE

## 2021-08-18 ENCOUNTER — Other Ambulatory Visit: Payer: Self-pay

## 2021-08-18 ENCOUNTER — Ambulatory Visit (HOSPITAL_COMMUNITY): Payer: 59 | Admitting: Physician Assistant

## 2021-08-18 ENCOUNTER — Encounter (HOSPITAL_COMMUNITY)
Admission: RE | Disposition: A | Discharge status: Home / Self Care | Payer: Self-pay | Source: Home / Self Care | Attending: Orthopedic Surgery

## 2021-08-18 ENCOUNTER — Observation Stay (HOSPITAL_COMMUNITY)
Admission: RE | Admit: 2021-08-18 | Discharge: 2021-08-21 | Disposition: A | Discharge status: Home / Self Care | Payer: 59 | Attending: Orthopedic Surgery | Admitting: Orthopedic Surgery

## 2021-08-18 ENCOUNTER — Encounter (HOSPITAL_COMMUNITY): Payer: Self-pay | Admitting: Orthopedic Surgery

## 2021-08-18 ENCOUNTER — Ambulatory Visit (HOSPITAL_BASED_OUTPATIENT_CLINIC_OR_DEPARTMENT_OTHER): Payer: 59 | Admitting: Certified Registered Nurse Anesthetist

## 2021-08-18 DIAGNOSIS — Z79899 Other long term (current) drug therapy: Secondary | ICD-10-CM | POA: Diagnosis not present

## 2021-08-18 DIAGNOSIS — J45909 Unspecified asthma, uncomplicated: Secondary | ICD-10-CM | POA: Diagnosis not present

## 2021-08-18 DIAGNOSIS — I10 Essential (primary) hypertension: Secondary | ICD-10-CM | POA: Insufficient documentation

## 2021-08-18 DIAGNOSIS — R7303 Prediabetes: Secondary | ICD-10-CM | POA: Diagnosis not present

## 2021-08-18 DIAGNOSIS — Z87891 Personal history of nicotine dependence: Secondary | ICD-10-CM | POA: Insufficient documentation

## 2021-08-18 DIAGNOSIS — M179 Osteoarthritis of knee, unspecified: Secondary | ICD-10-CM | POA: Diagnosis present

## 2021-08-18 DIAGNOSIS — M1711 Unilateral primary osteoarthritis, right knee: Principal | ICD-10-CM | POA: Insufficient documentation

## 2021-08-18 DIAGNOSIS — Z96651 Presence of right artificial knee joint: Secondary | ICD-10-CM

## 2021-08-18 HISTORY — PX: TOTAL KNEE ARTHROPLASTY: SHX125

## 2021-08-18 SURGERY — ARTHROPLASTY, KNEE, TOTAL
Anesthesia: Spinal | Site: Knee | Laterality: Right

## 2021-08-18 MED ORDER — ACETAMINOPHEN 325 MG PO TABS
325.0000 mg | ORAL_TABLET | Freq: Four times a day (QID) | ORAL | Status: DC | PRN
  Filled 2021-08-18: qty 2

## 2021-08-18 MED ORDER — LACTATED RINGERS IV SOLN: INTRAVENOUS | Status: DC

## 2021-08-18 MED ORDER — POLYETHYLENE GLYCOL 3350 17 G PO PACK: 17.0000 g | PACK | Freq: Every day | ORAL | Status: DC | PRN

## 2021-08-18 MED ORDER — OXYCODONE HCL 5 MG PO TABS
5.0000 mg | ORAL_TABLET | ORAL | Status: DC | PRN
  Administered 2021-08-19: 10 mg via ORAL
  Administered 2021-08-19: 15 mg via ORAL
  Filled 2021-08-18: qty 3
  Filled 2021-08-18: qty 1
  Filled 2021-08-18: qty 3

## 2021-08-18 MED ORDER — DEXAMETHASONE SODIUM PHOSPHATE 10 MG/ML IJ SOLN
8.0000 mg | Freq: Once | INTRAMUSCULAR | Status: AC
  Administered 2021-08-18: 8 mg via INTRAVENOUS

## 2021-08-18 MED ORDER — OLOPATADINE-MOMETASONE 665-25 MCG/ACT NA SUSP: 2.0000 | Freq: Two times a day (BID) | NASAL | Status: DC

## 2021-08-18 MED ORDER — BUPIVACAINE LIPOSOME 1.3 % IJ SUSP: 20.0000 mL | Freq: Once | INTRAMUSCULAR | Status: DC

## 2021-08-18 MED ORDER — ATORVASTATIN CALCIUM 20 MG PO TABS: 20.0000 mg | ORAL_TABLET | Freq: Every day | ORAL | Status: DC

## 2021-08-18 MED ORDER — MIDAZOLAM HCL 2 MG/2ML IJ SOLN
1.0000 mg | INTRAMUSCULAR | Status: AC
  Administered 2021-08-18: 1 mg via INTRAVENOUS
  Filled 2021-08-18: qty 2

## 2021-08-18 MED ORDER — PROPOFOL 10 MG/ML IV BOLUS
INTRAVENOUS | Status: DC | PRN
  Administered 2021-08-18: 10 mg via INTRAVENOUS

## 2021-08-18 MED ORDER — BUPIVACAINE LIPOSOME 1.3 % IJ SUSP
INTRAMUSCULAR | Status: DC | PRN
Start: 2021-08-18 — End: 2021-08-18
  Administered 2021-08-18: 20 mL

## 2021-08-18 MED ORDER — DOCUSATE SODIUM 100 MG PO CAPS
100.0000 mg | ORAL_CAPSULE | Freq: Two times a day (BID) | ORAL | Status: DC
  Administered 2021-08-18 – 2021-08-21 (×6): 100 mg via ORAL
  Filled 2021-08-18 (×6): qty 1

## 2021-08-18 MED ORDER — ASPIRIN EC 325 MG PO TBEC
325.0000 mg | DELAYED_RELEASE_TABLET | Freq: Every day | ORAL | Status: DC
  Administered 2021-08-19 – 2021-08-21 (×3): 325 mg via ORAL
  Filled 2021-08-18 (×3): qty 1

## 2021-08-18 MED ORDER — GABAPENTIN 300 MG PO CAPS
300.0000 mg | ORAL_CAPSULE | Freq: Three times a day (TID) | ORAL | Status: DC
  Administered 2021-08-18 – 2021-08-21 (×9): 300 mg via ORAL
  Filled 2021-08-18 (×9): qty 1

## 2021-08-18 MED ORDER — ONDANSETRON HCL 4 MG/2ML IJ SOLN
INTRAMUSCULAR | Status: DC | PRN
  Administered 2021-08-18: 4 mg via INTRAVENOUS

## 2021-08-18 MED ORDER — SODIUM CHLORIDE 0.9 % IV SOLN: INTRAVENOUS | Status: DC

## 2021-08-18 MED ORDER — PROPOFOL 500 MG/50ML IV EMUL
INTRAVENOUS | Status: DC | PRN
Start: 2021-08-18 — End: 2021-08-18
  Administered 2021-08-18: 75 ug/kg/min via INTRAVENOUS

## 2021-08-18 MED ORDER — HYDROCHLOROTHIAZIDE 12.5 MG PO TABS
12.5000 mg | ORAL_TABLET | Freq: Every day | ORAL | Status: DC
  Administered 2021-08-19 – 2021-08-21 (×3): 12.5 mg via ORAL
  Filled 2021-08-18 (×3): qty 1

## 2021-08-18 MED ORDER — PHENYLEPHRINE 40 MCG/ML (10ML) SYRINGE FOR IV PUSH (FOR BLOOD PRESSURE SUPPORT)
PREFILLED_SYRINGE | INTRAVENOUS | Status: AC
  Filled 2021-08-18: qty 10

## 2021-08-18 MED ORDER — BUDESONIDE 0.25 MG/2ML IN SUSP
0.2500 mg | Freq: Two times a day (BID) | RESPIRATORY_TRACT | Status: DC
Start: 2021-08-18 — End: 2021-08-21
  Administered 2021-08-18 – 2021-08-21 (×6): 0.25 mg via RESPIRATORY_TRACT
  Filled 2021-08-18 (×6): qty 2

## 2021-08-18 MED ORDER — PHENYLEPHRINE HCL-NACL 20-0.9 MG/250ML-% IV SOLN
INTRAVENOUS | Status: DC | PRN
  Administered 2021-08-18: 35 ug/min via INTRAVENOUS

## 2021-08-18 MED ORDER — MONTELUKAST SODIUM 10 MG PO TABS
10.0000 mg | ORAL_TABLET | Freq: Every day | ORAL | Status: DC
  Administered 2021-08-18 – 2021-08-20 (×3): 10 mg via ORAL
  Filled 2021-08-18 (×3): qty 1

## 2021-08-18 MED ORDER — SODIUM CHLORIDE 0.9 % IR SOLN
Status: DC | PRN
  Administered 2021-08-18: 1000 mL

## 2021-08-18 MED ORDER — ACETAMINOPHEN 10 MG/ML IV SOLN
1000.0000 mg | Freq: Four times a day (QID) | INTRAVENOUS | Status: DC
  Administered 2021-08-18: 1000 mg via INTRAVENOUS
  Filled 2021-08-18: qty 100

## 2021-08-18 MED ORDER — BUPIVACAINE LIPOSOME 1.3 % IJ SUSP
INTRAMUSCULAR | Status: AC
  Filled 2021-08-18: qty 20

## 2021-08-18 MED ORDER — MENTHOL 3 MG MT LOZG
1.0000 | LOZENGE | OROMUCOSAL | Status: DC | PRN
  Administered 2021-08-20: 3 mg via ORAL
  Filled 2021-08-18: qty 9

## 2021-08-18 MED ORDER — LORATADINE 10 MG PO TABS
10.0000 mg | ORAL_TABLET | Freq: Every day | ORAL | Status: DC
  Administered 2021-08-19 – 2021-08-21 (×3): 10 mg via ORAL
  Filled 2021-08-18 (×3): qty 1

## 2021-08-18 MED ORDER — METOCLOPRAMIDE HCL 5 MG PO TABS: 5.0000 mg | ORAL_TABLET | Freq: Three times a day (TID) | ORAL | Status: DC | PRN

## 2021-08-18 MED ORDER — STERILE WATER FOR IRRIGATION IR SOLN
Status: DC | PRN
  Administered 2021-08-18: 2000 mL

## 2021-08-18 MED ORDER — MECLIZINE HCL 25 MG PO TABS: 25.0000 mg | ORAL_TABLET | Freq: Three times a day (TID) | ORAL | Status: DC | PRN

## 2021-08-18 MED ORDER — BISACODYL 10 MG RE SUPP: 10.0000 mg | Freq: Every day | RECTAL | Status: DC | PRN

## 2021-08-18 MED ORDER — BUPIVACAINE IN DEXTROSE 0.75-8.25 % IT SOLN
INTRATHECAL | Status: DC | PRN
  Administered 2021-08-18: 1.6 mL via INTRATHECAL

## 2021-08-18 MED ORDER — ORAL CARE MOUTH RINSE: 15.0000 mL | Freq: Once | OROMUCOSAL | Status: DC

## 2021-08-18 MED ORDER — METOCLOPRAMIDE HCL 5 MG/ML IJ SOLN: 5.0000 mg | Freq: Three times a day (TID) | INTRAMUSCULAR | Status: DC | PRN

## 2021-08-18 MED ORDER — HYDROMORPHONE HCL 1 MG/ML IJ SOLN
0.5000 mg | INTRAMUSCULAR | Status: DC | PRN
  Administered 2021-08-18 – 2021-08-19 (×5): 1 mg via INTRAVENOUS
  Filled 2021-08-18 (×5): qty 1

## 2021-08-18 MED ORDER — PHENOL 1.4 % MT LIQD: 1.0000 | OROMUCOSAL | Status: DC | PRN

## 2021-08-18 MED ORDER — CHLORHEXIDINE GLUCONATE 0.12 % MT SOLN: 15.0000 mL | Freq: Once | OROMUCOSAL | Status: DC

## 2021-08-18 MED ORDER — SODIUM CHLORIDE (PF) 0.9 % IJ SOLN
INTRAMUSCULAR | Status: AC
  Filled 2021-08-18: qty 10

## 2021-08-18 MED ORDER — ONDANSETRON HCL 4 MG/2ML IJ SOLN: 4.0000 mg | Freq: Four times a day (QID) | INTRAMUSCULAR | Status: DC | PRN

## 2021-08-18 MED ORDER — METHOCARBAMOL 500 MG IVPB - SIMPLE MED
INTRAVENOUS | Status: AC
  Filled 2021-08-18: qty 50

## 2021-08-18 MED ORDER — METHOCARBAMOL 500 MG PO TABS
500.0000 mg | ORAL_TABLET | Freq: Four times a day (QID) | ORAL | Status: DC | PRN
  Administered 2021-08-18 – 2021-08-21 (×6): 500 mg via ORAL
  Filled 2021-08-18 (×7): qty 1

## 2021-08-18 MED ORDER — BUPIVACAINE-EPINEPHRINE (PF) 0.5% -1:200000 IJ SOLN
INTRAMUSCULAR | Status: DC | PRN
  Administered 2021-08-18: 25 mL via PERINEURAL

## 2021-08-18 MED ORDER — 0.9 % SODIUM CHLORIDE (POUR BTL) OPTIME
TOPICAL | Status: DC | PRN
  Administered 2021-08-18: 1000 mL

## 2021-08-18 MED ORDER — CEFAZOLIN SODIUM-DEXTROSE 2-4 GM/100ML-% IV SOLN
2.0000 g | INTRAVENOUS | Status: AC
  Administered 2021-08-18: 2 g via INTRAVENOUS
  Filled 2021-08-18: qty 100

## 2021-08-18 MED ORDER — FENTANYL CITRATE PF 50 MCG/ML IJ SOSY
PREFILLED_SYRINGE | INTRAMUSCULAR | Status: AC
  Filled 2021-08-18: qty 2

## 2021-08-18 MED ORDER — METHOCARBAMOL 500 MG IVPB - SIMPLE MED
500.0000 mg | Freq: Four times a day (QID) | INTRAVENOUS | Status: DC | PRN
  Administered 2021-08-18: 500 mg via INTRAVENOUS
  Filled 2021-08-18: qty 50

## 2021-08-18 MED ORDER — ONDANSETRON HCL 4 MG PO TABS: 4.0000 mg | ORAL_TABLET | Freq: Four times a day (QID) | ORAL | Status: DC | PRN

## 2021-08-18 MED ORDER — OXYCODONE HCL 5 MG PO TABS
5.0000 mg | ORAL_TABLET | ORAL | Status: DC | PRN
  Administered 2021-08-18 (×2): 10 mg via ORAL
  Filled 2021-08-18 (×2): qty 2

## 2021-08-18 MED ORDER — TRANEXAMIC ACID-NACL 1000-0.7 MG/100ML-% IV SOLN
1000.0000 mg | INTRAVENOUS | Status: AC
  Administered 2021-08-18: 1000 mg via INTRAVENOUS
  Filled 2021-08-18: qty 100

## 2021-08-18 MED ORDER — FLUTICASONE PROPIONATE 50 MCG/ACT NA SUSP
1.0000 | Freq: Two times a day (BID) | NASAL | Status: DC
  Administered 2021-08-18 – 2021-08-21 (×6): 1 via NASAL
  Filled 2021-08-18: qty 16

## 2021-08-18 MED ORDER — DEXAMETHASONE SODIUM PHOSPHATE 10 MG/ML IJ SOLN
10.0000 mg | Freq: Once | INTRAMUSCULAR | Status: AC
  Administered 2021-08-19: 10 mg via INTRAVENOUS
  Filled 2021-08-18: qty 1

## 2021-08-18 MED ORDER — SODIUM CHLORIDE (PF) 0.9 % IJ SOLN
INTRAMUSCULAR | Status: AC
  Filled 2021-08-18: qty 50

## 2021-08-18 MED ORDER — FENTANYL CITRATE PF 50 MCG/ML IJ SOSY
50.0000 ug | PREFILLED_SYRINGE | INTRAMUSCULAR | Status: AC
  Administered 2021-08-18: 50 ug via INTRAVENOUS
  Filled 2021-08-18: qty 2

## 2021-08-18 MED ORDER — DEXAMETHASONE SODIUM PHOSPHATE 10 MG/ML IJ SOLN
INTRAMUSCULAR | Status: AC
  Filled 2021-08-18: qty 1

## 2021-08-18 MED ORDER — FLEET ENEMA 7-19 GM/118ML RE ENEM: 1.0000 | ENEMA | Freq: Once | RECTAL | Status: DC | PRN

## 2021-08-18 MED ORDER — ALBUTEROL SULFATE HFA 108 (90 BASE) MCG/ACT IN AERS: 2.0000 | INHALATION_SPRAY | RESPIRATORY_TRACT | Status: DC | PRN

## 2021-08-18 MED ORDER — TRAMADOL HCL 50 MG PO TABS
50.0000 mg | ORAL_TABLET | Freq: Four times a day (QID) | ORAL | Status: DC | PRN
  Administered 2021-08-20: 100 mg via ORAL
  Filled 2021-08-18 (×2): qty 2

## 2021-08-18 MED ORDER — MORPHINE SULFATE (PF) 2 MG/ML IV SOLN
0.5000 mg | INTRAVENOUS | Status: DC | PRN
  Administered 2021-08-18 (×2): 1 mg via INTRAVENOUS
  Filled 2021-08-18 (×2): qty 1

## 2021-08-18 MED ORDER — DIPHENHYDRAMINE HCL 12.5 MG/5ML PO ELIX: 12.5000 mg | ORAL_SOLUTION | ORAL | Status: DC | PRN

## 2021-08-18 MED ORDER — ONDANSETRON HCL 4 MG/2ML IJ SOLN
INTRAMUSCULAR | Status: AC
  Filled 2021-08-18: qty 2

## 2021-08-18 MED ORDER — ALBUTEROL SULFATE (2.5 MG/3ML) 0.083% IN NEBU: 2.5000 mg | INHALATION_SOLUTION | RESPIRATORY_TRACT | Status: DC | PRN

## 2021-08-18 MED ORDER — POVIDONE-IODINE 10 % EX SWAB: 2.0000 "application " | Freq: Once | CUTANEOUS | Status: DC

## 2021-08-18 MED ORDER — SODIUM CHLORIDE (PF) 0.9 % IJ SOLN
INTRAMUSCULAR | Status: DC | PRN
  Administered 2021-08-18: 60 mL

## 2021-08-18 MED ORDER — CEFAZOLIN SODIUM-DEXTROSE 2-4 GM/100ML-% IV SOLN
2.0000 g | Freq: Four times a day (QID) | INTRAVENOUS | Status: AC
  Administered 2021-08-18 (×2): 2 g via INTRAVENOUS
  Filled 2021-08-18 (×2): qty 100

## 2021-08-18 MED ORDER — FENTANYL CITRATE PF 50 MCG/ML IJ SOSY
25.0000 ug | PREFILLED_SYRINGE | INTRAMUSCULAR | Status: DC | PRN
  Administered 2021-08-18: 50 ug via INTRAVENOUS

## 2021-08-18 SURGICAL SUPPLY — 61 items
ATTUNE MED DOME PAT 32 KNEE (Knees) ×1 IMPLANT
ATTUNE PS FEM RT SZ 4 CEM KNEE (Femur) ×1 IMPLANT
ATTUNE PSRP INSR SZ4 8 KNEE (Insert) ×1 IMPLANT
BAG COUNTER SPONGE SURGICOUNT (BAG) IMPLANT
BAG SPEC THK2 15X12 ZIP CLS (MISCELLANEOUS) ×1
BAG SPNG CNTER NS LX DISP (BAG)
BAG ZIPLOCK 12X15 (MISCELLANEOUS) ×2 IMPLANT
BASE TIBIAL ROT PLAT SZ 3 KNEE (Knees) IMPLANT
BLADE SAG 18X100X1.27 (BLADE) ×2 IMPLANT
BLADE SAW SGTL 11.0X1.19X90.0M (BLADE) ×2 IMPLANT
BNDG CMPR 82X61 PLY HI ABS (GAUZE/BANDAGES/DRESSINGS) ×1
BNDG CMPR MED 10X6 ELC LF (GAUZE/BANDAGES/DRESSINGS) ×1
BNDG CONFORM 6X.82 1P STRL (GAUZE/BANDAGES/DRESSINGS) ×1 IMPLANT
BNDG ELASTIC 6X10 VLCR STRL LF (GAUZE/BANDAGES/DRESSINGS) ×1 IMPLANT
BNDG ELASTIC 6X5.8 VLCR STR LF (GAUZE/BANDAGES/DRESSINGS) ×2 IMPLANT
BOWL SMART MIX CTS (DISPOSABLE) ×2 IMPLANT
BSPLAT TIB 3 CMNT ROT PLAT STR (Knees) ×1 IMPLANT
CEMENT HV SMART SET (Cement) ×4 IMPLANT
COVER SURGICAL LIGHT HANDLE (MISCELLANEOUS) ×2 IMPLANT
CUFF TOURN SGL QUICK 34 (TOURNIQUET CUFF) ×2
CUFF TRNQT CYL 34X4.125X (TOURNIQUET CUFF) ×1 IMPLANT
DRAPE INCISE IOBAN 66X45 STRL (DRAPES) ×2 IMPLANT
DRAPE U-SHAPE 47X51 STRL (DRAPES) ×2 IMPLANT
DRESSING AQUACEL AG SP 3.5X10 (GAUZE/BANDAGES/DRESSINGS) IMPLANT
DRSG AQUACEL AG ADV 3.5X10 (GAUZE/BANDAGES/DRESSINGS) ×2 IMPLANT
DRSG AQUACEL AG SP 3.5X10 (GAUZE/BANDAGES/DRESSINGS) ×2
DURAPREP 26ML APPLICATOR (WOUND CARE) ×2 IMPLANT
ELECT REM PT RETURN 15FT ADLT (MISCELLANEOUS) ×2 IMPLANT
GLOVE SRG 8 PF TXTR STRL LF DI (GLOVE) ×1 IMPLANT
GLOVE SURG ENC MOIS LTX SZ6.5 (GLOVE) ×2 IMPLANT
GLOVE SURG ENC MOIS LTX SZ8 (GLOVE) ×4 IMPLANT
GLOVE SURG UNDER POLY LF SZ7 (GLOVE) ×2 IMPLANT
GLOVE SURG UNDER POLY LF SZ8 (GLOVE) ×2
GLOVE SURG UNDER POLY LF SZ8.5 (GLOVE) ×2 IMPLANT
GOWN STRL REUS W/TWL LRG LVL3 (GOWN DISPOSABLE) ×2 IMPLANT
GOWN STRL REUS W/TWL XL LVL3 (GOWN DISPOSABLE) IMPLANT
HANDPIECE INTERPULSE COAX TIP (DISPOSABLE) ×2
HOLDER FOLEY CATH W/STRAP (MISCELLANEOUS) IMPLANT
IMMOBILIZER KNEE 20 (SOFTGOODS) ×2
IMMOBILIZER KNEE 20 THIGH 36 (SOFTGOODS) ×1 IMPLANT
KIT TURNOVER KIT A (KITS) IMPLANT
MANIFOLD NEPTUNE II (INSTRUMENTS) ×2 IMPLANT
NS IRRIG 1000ML POUR BTL (IV SOLUTION) ×2 IMPLANT
PACK TOTAL KNEE CUSTOM (KITS) ×2 IMPLANT
PADDING CAST COTTON 6X4 STRL (CAST SUPPLIES) ×4 IMPLANT
PROTECTOR NERVE ULNAR (MISCELLANEOUS) ×2 IMPLANT
SET HNDPC FAN SPRY TIP SCT (DISPOSABLE) ×1 IMPLANT
SPIKE FLUID TRANSFER (MISCELLANEOUS) ×2 IMPLANT
SPONGE T-LAP 18X18 ~~LOC~~+RFID (SPONGE) ×4 IMPLANT
STRIP CLOSURE SKIN 1/2X4 (GAUZE/BANDAGES/DRESSINGS) ×4 IMPLANT
SUT MNCRL AB 4-0 PS2 18 (SUTURE) ×2 IMPLANT
SUT STRATAFIX 0 PDS 27 VIOLET (SUTURE) ×2
SUT VIC AB 2-0 CT1 27 (SUTURE) ×6
SUT VIC AB 2-0 CT1 TAPERPNT 27 (SUTURE) ×3 IMPLANT
SUTURE STRATFX 0 PDS 27 VIOLET (SUTURE) ×1 IMPLANT
TAPE STRIPS DRAPE STRL (GAUZE/BANDAGES/DRESSINGS) ×1 IMPLANT
TIBIAL BASE ROT PLAT SZ 3 KNEE (Knees) ×2 IMPLANT
TRAY FOLEY MTR SLVR 16FR STAT (SET/KITS/TRAYS/PACK) ×2 IMPLANT
TUBE SUCTION HIGH CAP CLEAR NV (SUCTIONS) ×2 IMPLANT
WATER STERILE IRR 1000ML POUR (IV SOLUTION) ×4 IMPLANT
WRAP KNEE MAXI GEL POST OP (GAUZE/BANDAGES/DRESSINGS) ×2 IMPLANT

## 2021-08-18 NOTE — Anesthesia Procedure Notes (Signed)
Spinal ? ?Patient location during procedure: OR ?End time: 08/18/2021 11:22 AM ?Reason for block: surgical anesthesia ?Staffing ?Performed: resident/CRNA  ?Anesthesiologist: Belinda Block, MD ?Resident/CRNA: Maxwell Caul, CRNA ?Preanesthetic Checklist ?Completed: patient identified, IV checked, site marked, risks and benefits discussed, surgical consent, monitors and equipment checked, pre-op evaluation and timeout performed ?Spinal Block ?Patient position: sitting ?Prep: DuraPrep ?Patient monitoring: heart rate, cardiac monitor, continuous pulse ox and blood pressure ?Approach: midline ?Location: L3-4 ?Injection technique: single-shot ?Needle ?Needle type: Pencan  ?Needle gauge: 24 G ?Needle length: 10 cm ?Assessment ?Sensory level: T4 ?Events: CSF return ?Additional Notes ?IV functioning, monitors applied to pt. Expiration date of kit checked and confirmed to be in date. Sterile prep and drape, hand hygiene and sterile gloves used. Pt was positioned and spine was prepped in sterile fashion. Skin was anesthetized with lidocaine. Free flow of clear CSF obtained prior to injecting local anesthetic into CSF x 1 attempt. Spinal needle aspirated freely following injection. Needle was carefully withdrawn, and pt tolerated procedure well. Loss of motor and sensory on exam post injection. Dr Nyoka Cowden at bedside for entire placement. ? ? ? ?

## 2021-08-18 NOTE — Op Note (Signed)
OPERATIVE REPORT-TOTAL KNEE ARTHROPLASTY ? ? ?Pre-operative diagnosis- Osteoarthritis  Right knee(s) ? ?Post-operative diagnosis- Osteoarthritis Right knee(s) ? ?Procedure-  Right  Total Knee Arthroplasty ? ?Surgeon- Dione Plover. Tavion Senkbeil, MD ? ?Assistant- Molli Barrows, PA-C  ? ?Anesthesia-   Adductor canal block and spinal ? ?EBL- 25 ml ?  ?Drains None ? ?Tourniquet time-  ?Total Tourniquet Time Documented: ?Thigh (Right) - 33 minutes ?Total: Thigh (Right) - 33 minutes ?   ? ?Complications- None ? ?Condition-PACU - hemodynamically stable.  ? ?Brief Clinical Note  Rebecca [name] is a 66 y.o. year old female with end stage OA of her right knee with progressively worsening pain and dysfunction. She has constant pain, with activity and at rest and significant functional deficits with difficulties even with ADLs. She has had extensive non-op management including analgesics, injections of cortisone and viscosupplements, and home exercise program, but remains in significant pain with significant dysfunction.Radiographs show bone on bone arthritis medial and patellofemoral. She presents now for right Total Knee Arthroplasty.    ? ?Procedure in detail--- ? ? The patient is brought into the operating room and positioned supine on the operating table. After successful administration of  Adductor canal block and spinal,   a tourniquet is placed high on the  Right thigh(s) and the lower extremity is prepped and draped in the usual sterile fashion. Time out is performed by the operating team and then the  Right lower extremity is wrapped in Esmarch, knee flexed and the tourniquet inflated to 300 mmHg.  ?     A midline incision is made with a ten blade through the subcutaneous tissue to the level of the extensor mechanism. A fresh blade is used to make a medial parapatellar arthrotomy. Soft tissue over the proximal medial tibia is subperiosteally elevated to the joint line with a knife and into the semimembranosus bursa with a  Cobb elevator. Soft tissue over the proximal lateral tibia is elevated with attention being paid to avoiding the patellar tendon on the tibial tubercle. The patella is everted, knee flexed 90 degrees and the ACL and PCL are removed. Findings are bone on bone medial and patellofemoral with large global osteophytes   ?     The drill is used to create a starting hole in the distal femur and the canal is thoroughly irrigated with sterile saline to remove the fatty contents. The 5 degree Right  valgus alignment guide is placed into the femoral canal and the distal femoral cutting block is pinned to remove 9 mm off the distal femur. Resection is made with an oscillating saw. ?     The tibia is subluxed forward and the menisci are removed. The extramedullary alignment guide is placed referencing proximally at the medial aspect of the tibial tubercle and distally along the second metatarsal axis and tibial crest. The block is pinned to remove 28m off the more deficient medial  side. Resection is made with an oscillating saw. Size 3is the most appropriate size for the tibia and the proximal tibia is prepared with the modular drill and keel punch for that size. ?     The femoral sizing guide is placed and size 4 is most appropriate. Rotation is marked off the epicondylar axis and confirmed by creating a rectangular flexion gap at 90 degrees. The size 4 cutting block is pinned in this rotation and the anterior, posterior and chamfer cuts are made with the oscillating saw. The intercondylar block is then placed and that cut is  made. ?     Trial size 3 tibial component, trial size 4 posterior stabilized femur and a 8  mm posterior stabilized rotating platform insert trial is placed. Full extension is achieved with excellent varus/valgus and anterior/posterior balance throughout full range of motion. The patella is everted and thickness measured to be 21  mm. Free hand resection is taken to 12 mm, a 32 template is placed, lug  holes are drilled, trial patella is placed, and it tracks normally. Osteophytes are removed off the posterior femur with the trial in place. All trials are removed and the cut bone surfaces prepared with pulsatile lavage. Cement is mixed and once ready for implantation, the size 4 tibial implant, size  5 posterior stabilized femoral component, and the size 32 patella are cemented in place and the patella is held with the clamp. The trial insert is placed and the knee held in full extension. The Exparel (20 ml mixed with 60 ml saline) is injected into the extensor mechanism, posterior capsule, medial and lateral gutters and subcutaneous tissues.  All extruded cement is removed and once the cement is hard the permanent 8 mm posterior stabilized rotating platform insert is placed into the tibial tray. ?     The wound is copiously irrigated with saline solution and the extensor mechanism closed with # 0 Stratofix suture. The tourniquet is released for a total tourniquet time of 33  minutes. Flexion against gravity is 140 degrees and the patella tracks normally. Subcutaneous tissue is closed with 2.0 vicryl and subcuticular with running 4.0 Monocryl. The incision is cleaned and dried and steri-strips and a bulky sterile dressing are applied. The limb is placed into a knee immobilizer and the patient is awakened and transported to recovery in stable condition. ?     Please note that a surgical assistant was a medical necessity for this procedure in order to perform it in a safe and expeditious manner. Surgical assistant was necessary to retract the ligaments and vital neurovascular structures to prevent injury to them and also necessary for proper positioning of the limb to allow for anatomic placement of the prosthesis. ? ? ?Dione Plover Isidor Bromell, MD ? ? ? ?08/18/2021, 12:23 PM ? ? ?

## 2021-08-18 NOTE — Progress Notes (Signed)
Orthopedic Tech Progress Note ?Patient Details:  ?Rebecca [name] ?05-11-1956 ?[id number] ? ?CPM Right Knee ?CPM Right Knee: On ?Right Knee Flexion (Degrees): 40 ?Right Knee Extension (Degrees): 10 ? ?Post Interventions ?Patient Tolerated: Well ?Instructions Provided: Care of device, Adjustment of device ? ?[place] Pink ?08/18/2021, 12:48 PM ? ?

## 2021-08-18 NOTE — Anesthesia Procedure Notes (Signed)
Anesthesia Regional Block: Adductor canal block  ? ?Pre-Anesthetic Checklist: , timeout performed,  Correct Patient, Correct Site, Correct Laterality,  Correct Procedure, Correct Position, site marked,  Risks and benefits discussed,  Surgical consent,  Pre-op evaluation,  At surgeon's request and post-op pain management ? ?Laterality: Right ? ?Prep: chloraprep     ?  ?Needles:  ?Injection technique: Single-shot ? ?Needle Type: Stimulator Needle - 80   ? ? ? ? ? ? ? ?Additional Needles: ? ? ?Procedures: Doppler guided,,,, ultrasound used (permanent image in chart),,    ?Narrative:  ?Start time: 08/18/2021 10:50 AM ?End time: 08/18/2021 11:10 AM ?Injection made incrementally with aspirations every 5 mL. ? ?Performed by: Personally  ?Anesthesiologist: Belinda Block, MD ? ? ? ? ?

## 2021-08-18 NOTE — Transfer of Care (Signed)
Immediate Anesthesia Transfer of Care Note ? ?Patient: Rebecca [name] ? ?Procedure(s) Performed: TOTAL KNEE ARTHROPLASTY (Right: Knee) ? ?Patient Location: PACU ? ?Anesthesia Type:Spinal ? ?Level of Consciousness: awake, alert  and oriented ? ?Airway & Oxygen Therapy: Patient Spontanous Breathing and Patient connected to face mask oxygen ? ?Post-op Assessment: Report given to RN and Post -op Vital signs reviewed and stable ? ?Post vital signs: Reviewed and stable ? ?Last Vitals:  ?Vitals Value Taken Time  ?BP 147/82 08/18/21 1250  ?Temp    ?Pulse 72 08/18/21 1252  ?Resp 27 08/18/21 1252  ?SpO2 100 % 08/18/21 1252  ?Vitals shown include unvalidated device data. ? ?Last Pain:  ?Vitals:  ? 08/18/21 1110  ?TempSrc:   ?PainSc: Asleep  ?   ? ?Patients Stated Pain Goal: 2 (08/18/21 0936) ? ?Complications: No notable events documented. ?

## 2021-08-18 NOTE — Anesthesia Preprocedure Evaluation (Addendum)
Anesthesia Evaluation  ?Patient identified by MRN, date of birth, ID band ?Patient awake ? ? ? ?Reviewed: ?Allergy & Precautions, NPO status , Patient's Chart, lab work & pertinent test results ? ?Airway ?Mallampati: II ? ?TM Distance: >3 FB ? ? ? ? Dental ?  ?Pulmonary ?former smoker,  ?  ?breath sounds clear to auscultation ? ? ? ? ? ? Cardiovascular ?hypertension,  ?Rhythm:Regular Rate:Normal ? ? ?  ?Neuro/Psych ?  ? GI/Hepatic ?Neg liver ROS, GERD  ,  ?Endo/Other  ?negative endocrine ROS ? Renal/GU ?negative Renal ROS  ? ?  ?Musculoskeletal ? ?(+) Arthritis ,  ? Abdominal ?  ?Peds ? Hematology ?  ?Anesthesia Other Findings ? ? Reproductive/Obstetrics ? ?  ? ? ? ? ? ? ? ? ? ? ? ? ? ?  ?  ? ? ? ? ? ? ? ? ?Anesthesia Physical ?Anesthesia Plan ? ?ASA: 3 ? ?Anesthesia Plan: Spinal  ? ?Post-op Pain Management: Regional block*  ? ?Induction:  ? ?PONV Risk Score and Plan: 3 and Ondansetron and Dexamethasone ? ?Airway Management Planned: Simple Face Mask ? ?Additional Equipment:  ? ?Intra-op Plan:  ? ?Post-operative Plan:  ? ?Informed Consent: I have reviewed the patients History and Physical, chart, labs and discussed the procedure including the risks, benefits and alternatives for the proposed anesthesia with the patient or authorized representative who has indicated his/her understanding and acceptance.  ? ? ? ?Dental advisory given ? ?Plan Discussed with: CRNA and Anesthesiologist ? ?Anesthesia Plan Comments:   ? ? ? ? ? ?Anesthesia Quick Evaluation ? ?

## 2021-08-18 NOTE — Anesthesia Postprocedure Evaluation (Signed)
Anesthesia Post Note ? ?Patient: Rebecca [name] ? ?Procedure(s) Performed: TOTAL KNEE ARTHROPLASTY (Right: Knee) ? ?  ? ?Patient location during evaluation: PACU ?Anesthesia Type: Spinal ?Level of consciousness: awake ?Pain management: pain level controlled ?Vital Signs Assessment: post-procedure vital signs reviewed and stable ?Cardiovascular status: stable ?Postop Assessment: no apparent nausea or vomiting ?Anesthetic complications: no ? ? ?No notable events documented. ? ?Last Vitals:  ?Vitals:  ? 08/18/21 1548 08/18/21 1928  ?BP: (!) 147/80   ?Pulse: 88   ?Resp: 17   ?Temp: 36.8 ?C   ?SpO2: 99% 93%  ?  ?Last Pain:  ?Vitals:  ? 08/18/21 1930  ?TempSrc:   ?PainSc: 6   ? ? ?  ?  ?  ?  ?  ?  ? ?Trayquan Kolakowski ? ? ? ? ?

## 2021-08-18 NOTE — Anesthesia Procedure Notes (Signed)
Date/Time: 08/18/2021 11:15 AM ?Performed by: Maxwell Caul, CRNA ?Pre-anesthesia Checklist: Patient identified, Emergency Drugs available, Suction available and Patient being monitored ?Oxygen Delivery Method: Simple face mask ? ? ? ? ?

## 2021-08-18 NOTE — Interval H&P Note (Signed)
History and Physical Interval Note: ? ?08/18/2021 ?9:23 AM ? ?Rebecca [name]  has presented today for surgery, with the diagnosis of right knee osteoarthritis.  The various methods of treatment have been discussed with the patient and family. After consideration of risks, benefits and other options for treatment, the patient has consented to  Procedure(s): ?TOTAL KNEE ARTHROPLASTY (Right) as a surgical intervention.  The patient's history has been reviewed, patient examined, no change in status, stable for surgery.  I have reviewed the patient's chart and labs.  Questions were answered to the patient's satisfaction.   ? ? ?Pilar Plate Charmin Aguiniga ? ? ?

## 2021-08-18 NOTE — Progress Notes (Signed)
AssistedDr. Charlene Green with right, ultrasound guided, adductor canal block. Side rails up, monitors on throughout procedure. See vital signs in flow sheet. Tolerated Procedure well. ? ?

## 2021-08-18 NOTE — Evaluation (Signed)
Physical Therapy Evaluation ?Patient Details ?Name: Rebecca [name] ?MRN: [id number] ?DOB: 02/08/1956 ?Today's Date: 08/18/2021 ? ?History of Present Illness ? Pt is 66 yo female s/p R TKA on 08/18/21.  Pt with hx including but not limited to sleep apnea, obesity, gastric banding, arthritis, HTN, and HLD.  ?Clinical Impression ? Pt is s/p TKA resulting in the deficits listed below (see PT Problem List). At baseline, pt is independent.  She has support at home but does have multiple steps to manage at home. Today, pt reports significant pain but motivated to move and rated at 6/10.  She was able to ambulate 41' with RW and min guard.  Pt did have limited knee ROM due to pain. Pt is expected to progress well.  Pt will benefit from skilled PT to increase their independence and safety with mobility to allow discharge to the venue listed below.  ?   ?   ? ?Recommendations for follow up therapy are one component of a multi-disciplinary discharge planning process, led by the attending physician.  Recommendations may be updated based on patient status, additional functional criteria and insurance authorization. ? ?Follow Up Recommendations Follow physician's recommendations for discharge plan and follow up therapies ? ?  ?Assistance Recommended at Discharge Intermittent Supervision/Assistance  ?Patient can return home with the following ? A little help with walking and/or transfers;A little help with bathing/dressing/bathroom;Assistance with cooking/housework ? ?  ?Equipment Recommendations Rolling walker (2 wheels) (may need RW, son is checking at home)  ?Recommendations for Other Services ?    ?  ?Functional Status Assessment Patient has had a recent decline in their functional status and demonstrates the ability to make significant improvements in function in a reasonable and predictable amount of time.  ? ?  ?Precautions / Restrictions Precautions ?Precautions: Fall ?Required Braces or Orthoses: Knee Immobilizer - Right ?Knee  Immobilizer - Right: Discontinue once straight leg raise with < 10 degree lag ?Restrictions ?Weight Bearing Restrictions: No ?Other Position/Activity Restrictions: WBAT  ? ?  ? ?Mobility ? Bed Mobility ?Overal bed mobility: Needs Assistance ?Bed Mobility: Supine to Sit ?  ?  ?Supine to sit: Min assist ?  ?  ?General bed mobility comments: Min A for R LE and cues ?  ? ?Transfers ?Overall transfer level: Needs assistance ?Equipment used: Rolling walker (2 wheels) ?Transfers: Sit to/from Stand ?Sit to Stand: Min assist ?  ?  ?  ?  ?  ?General transfer comment: Cues for hand placement and R LE management ?  ? ?Ambulation/Gait ?Ambulation/Gait assistance: Min guard ?Gait Distance (Feet): 40 Feet ?Assistive device: Rolling walker (2 wheels) ?Gait Pattern/deviations: Step-to pattern, Decreased stride length, Decreased weight shift to right, Antalgic ?Gait velocity: decreased ?  ?  ?General Gait Details: Cues for sequencing and RW use; chair follow for safety ? ?Stairs ?  ?  ?  ?  ?  ? ?Wheelchair Mobility ?  ? ?Modified Rankin (Stroke Patients Only) ?  ? ?  ? ?Balance Overall balance assessment: Needs assistance ?Sitting-balance support: No upper extremity supported ?Sitting balance-Leahy Scale: Good ?  ?  ?Standing balance support: Bilateral upper extremity supported ?Standing balance-Leahy Scale: Poor ?Standing balance comment: Requiring RW but steady ?  ?  ?  ?  ?  ?  ?  ?  ?  ?  ?  ?   ? ? ? ?Pertinent Vitals/Pain Pain Assessment ?Pain Assessment: 0-10 ?Pain Score: 6  ?Pain Location: R TKA ?Pain Descriptors / Indicators: Discomfort, Sore, Aching, Grimacing (Grimacing and  reports significant pain but only rating at 6/10) ?Pain Intervention(s): Limited activity within patient's tolerance, Monitored during session, Premedicated before session, Ice applied, Repositioned  ? ? ?Home Living Family/patient expects to be discharged to:: Private residence ?Living Arrangements: Spouse/significant other;Children ?Available Help at  Discharge: Family;Available PRN/intermittently ?Type of Home: House ?Home Access: Ramped entrance (but would have to walk around) ?  ?  ?Alternate Level Stairs-Number of Steps: flight ?Home Layout: Multi-level;Full bath on main level;Other (Comment) (bedroom upstairs but has recliner on first floor) ?Home Equipment: Grab bars - toilet ?Additional Comments: potentially has RW at home - son will check  ?  ?Prior Function Prior Level of Function : Independent/Modified Independent;Driving ?  ?  ?  ?  ?  ?  ?Mobility Comments: Ambulated without AD ?ADLs Comments: Independent with ADLs and IADLs ?  ? ? ?Hand Dominance  ?   ? ?  ?Extremity/Trunk Assessment  ? Upper Extremity Assessment ?Upper Extremity Assessment: Overall WFL for tasks assessed ?  ? ?Lower Extremity Assessment ?Lower Extremity Assessment: LLE deficits/detail;RLE deficits/detail ?RLE Deficits / Details: Expected post op changes; ROM: ankle and hip WFL, knee 5 to 40 ; MMT: ankle 5/5, knee 1/5, hip 1/5 ?LLE Deficits / Details: ROM WFL; MMT 5/5 ?  ? ?Cervical / Trunk Assessment ?Cervical / Trunk Assessment: Normal  ?Communication  ? Communication: No difficulties  ?Cognition Arousal/Alertness: Awake/alert ?Behavior During Therapy: [facility] for tasks assessed/performed ?Overall Cognitive Status: Within Functional Limits for tasks assessed ?  ?  ?  ?  ?  ?  ?  ?  ?  ?  ?  ?  ?  ?  ?  ?  ?General Comments: Motivated ?  ?  ? ?  ?General Comments General comments (skin integrity, edema, etc.): Educated on resting with leg straight; ankle pumps and quad sets ? ?  ?Exercises    ? ?Assessment/Plan  ?  ?PT Assessment Patient needs continued PT services  ?PT Problem List Decreased strength;Decreased mobility;Decreased safety awareness;Decreased range of motion;Decreased activity tolerance;Decreased balance;Decreased knowledge of use of DME;Pain ? ?   ?  ?PT Treatment Interventions DME instruction;Therapeutic activities;Modalities;Gait training;Therapeutic  exercise;Patient/family education;Stair training;Balance training;Functional mobility training   ? ?PT Goals (Current goals can be found in the Care Plan section)  ?Acute Rehab PT Goals ?Patient Stated Goal: return home ?PT Goal Formulation: With patient ?Time For Goal Achievement: 09/01/21 ?Potential to Achieve Goals: Good ? ?  ?Frequency 7X/week ?  ? ? ?Co-evaluation   ?  ?  ?  ?  ? ? ?  ?AM-PAC PT "6 Clicks" Mobility  ?Outcome Measure Help needed turning from your back to your side while in a flat bed without using bedrails?: A Little ?Help needed moving from lying on your back to sitting on the side of a flat bed without using bedrails?: A Little ?Help needed moving to and from a bed to a chair (including a wheelchair)?: A Little ?Help needed standing up from a chair using your arms (e.g., wheelchair or bedside chair)?: A Little ?Help needed to walk in hospital room?: A Little ?Help needed climbing 3-5 steps with a railing? : A Lot ?6 Click Score: 17 ? ?  ?End of Session Equipment Utilized During Treatment: Gait belt ?Activity Tolerance: Patient tolerated treatment well ?Patient left: with call bell/phone within reach;in chair;with chair alarm set;with family/visitor present ?Nurse Communication: Mobility status ?PT Visit Diagnosis: Other abnormalities of gait and mobility (R26.89);Muscle weakness (generalized) (M62.81) ?  ? ?Time: 2202-5427 ?PT Time Calculation (min) (  ACUTE ONLY): 23 min ? ? ?Charges:   PT Evaluation ?$PT Eval Low Complexity: 1 Low ?PT Treatments ?$Gait Training: 8-22 mins ?  ?   ? ? ?Abran Richard, PT ?Acute Rehab Services ?Pager [phone] ?[facility] [phone] ? ? ?Mikael Spray [place] ?08/18/2021, 5:17 PM ? ?

## 2021-08-19 ENCOUNTER — Encounter (HOSPITAL_COMMUNITY): Payer: Self-pay | Admitting: Orthopedic Surgery

## 2021-08-19 DIAGNOSIS — M1711 Unilateral primary osteoarthritis, right knee: Secondary | ICD-10-CM | POA: Diagnosis not present

## 2021-08-19 LAB — BASIC METABOLIC PANEL
Anion gap: 7 (ref 5–15)
BUN: 22 mg/dL (ref 8–23)
CO2: 27 mmol/L (ref 22–32)
Calcium: 8.9 mg/dL (ref 8.9–10.3)
Chloride: 103 mmol/L (ref 98–111)
Creatinine, Ser: 1.09 mg/dL — ABNORMAL HIGH (ref 0.44–1.00)
GFR, Estimated: 56 mL/min — ABNORMAL LOW (ref >60)
Glucose, Bld: 135 mg/dL — ABNORMAL HIGH (ref 70–99)
Potassium: 3.6 mmol/L (ref 3.5–5.1)
Sodium: 137 mmol/L (ref 135–145)

## 2021-08-19 LAB — CBC
HCT: 36.1 % (ref 36.0–46.0)
Hemoglobin: 11.7 g/dL — ABNORMAL LOW (ref 12.0–15.0)
MCH: 30.8 pg (ref 26.0–34.0)
MCHC: 32.4 g/dL (ref 30.0–36.0)
MCV: 95 fL (ref 80.0–100.0)
Platelets: 358 10*3/uL (ref 150–400)
RBC: 3.8 MIL/uL — ABNORMAL LOW (ref 3.87–5.11)
RDW: 13 % (ref 11.5–15.5)
WBC: 13.7 10*3/uL — ABNORMAL HIGH (ref 4.0–10.5)
nRBC: 0 % (ref 0.0–0.2)

## 2021-08-19 MED ORDER — METHOCARBAMOL 500 MG PO TABS: 500.0000 mg | ORAL_TABLET | Freq: Four times a day (QID) | ORAL | 0 refills | Status: DC | PRN

## 2021-08-19 MED ORDER — HYDROMORPHONE HCL 2 MG PO TABS
2.0000 mg | ORAL_TABLET | ORAL | Status: DC | PRN
  Administered 2021-08-19: 4 mg via ORAL
  Administered 2021-08-19: 2 mg via ORAL
  Administered 2021-08-20: 4 mg via ORAL
  Administered 2021-08-20: 2 mg via ORAL
  Administered 2021-08-20 – 2021-08-21 (×4): 4 mg via ORAL
  Administered 2021-08-21: 16:00:00 2 mg via ORAL
  Filled 2021-08-19 (×5): qty 2
  Filled 2021-08-19 (×2): qty 1
  Filled 2021-08-19 (×2): qty 2
  Filled 2021-08-19: qty 1

## 2021-08-19 MED ORDER — TRAMADOL HCL 50 MG PO TABS: 50.0000 mg | ORAL_TABLET | Freq: Four times a day (QID) | ORAL | 0 refills | Status: DC | PRN

## 2021-08-19 MED ORDER — GABAPENTIN 300 MG PO CAPS: ORAL_CAPSULE | ORAL | 0 refills | Status: DC

## 2021-08-19 MED ORDER — ASPIRIN 325 MG PO TBEC: 325.0000 mg | DELAYED_RELEASE_TABLET | Freq: Two times a day (BID) | ORAL | 0 refills | Status: DC

## 2021-08-19 MED ORDER — OXYCODONE HCL 5 MG PO TABS: 5.0000 mg | ORAL_TABLET | Freq: Four times a day (QID) | ORAL | 0 refills | Status: DC | PRN

## 2021-08-19 NOTE — Progress Notes (Signed)
Physical Therapy Treatment ?Patient Details ?Name: Rebecca [name] ?MRN: [id number] ?DOB: 26-Oct-1955 ?Today's Date: 08/19/2021 ? ? ?History of Present Illness Pt is 66 yo female s/p R TKA on 08/18/21.  Pt with hx including but not limited to sleep apnea, obesity, gastric banding, arthritis, HTN, and HLD. ? ?  ?PT Comments  ? ? Pt was limited by pain today.  She was premedicated and had little pain at rest but increased with short distance ambulation.  Performed limited exercises due to pain.  Additionally, pt with decreased O2 sats with activity.  Will continue to benefit from PT. ?   ?Recommendations for follow up therapy are one component of a multi-disciplinary discharge planning process, led by the attending physician.  Recommendations may be updated based on patient status, additional functional criteria and insurance authorization. ? ?Follow Up Recommendations ? Follow physician's recommendations for discharge plan and follow up therapies ?  ?  ?Assistance Recommended at Discharge Intermittent Supervision/Assistance  ?Patient can return home with the following A little help with walking and/or transfers;A little help with bathing/dressing/bathroom;Assistance with cooking/housework ?  ?Equipment Recommendations ? Rolling walker (2 wheels)  ?  ?Recommendations for Other Services   ? ? ?  ?Precautions / Restrictions Precautions ?Precautions: Fall ?Required Braces or Orthoses: Knee Immobilizer - Right ?Knee Immobilizer - Right: Discontinue once straight leg raise with < 10 degree lag ?Restrictions ?Weight Bearing Restrictions: No ?Other Position/Activity Restrictions: WBAT  ?  ? ?Mobility ? Bed Mobility ?  ?  ?  ?  ?  ?  ?  ?General bed mobility comments: in recliner at arrival ?  ? ?Transfers ?Overall transfer level: Needs assistance ?Equipment used: Rolling walker (2 wheels) ?Transfers: Sit to/from Stand ?Sit to Stand: Min assist ?  ?  ?  ?  ?  ?General transfer comment: Cues for hand placement and R LE management -  light min A to steady ?  ? ?Ambulation/Gait ?Ambulation/Gait assistance: Min guard ?Gait Distance (Feet): 35 Feet ?Assistive device: Rolling walker (2 wheels) ?Gait Pattern/deviations: Step-to pattern, Decreased stride length, Decreased weight shift to right, Antalgic ?Gait velocity: decreased ?  ?  ?General Gait Details: Cues for sequencing and RW use; very antalgic, pt also becoming very winded, increased RR - reports due to pain, difficulty making it back to chair in last 5 ' ? ? ?Stairs ?  ?  ?  ?  ?  ? ? ?Wheelchair Mobility ?  ? ?Modified Rankin (Stroke Patients Only) ?  ? ? ?  ?Balance Overall balance assessment: Needs assistance ?Sitting-balance support: No upper extremity supported ?Sitting balance-Leahy Scale: Good ?  ?  ?Standing balance support: Bilateral upper extremity supported, Reliant on assistive device for balance ?Standing balance-Leahy Scale: Poor ?  ?  ?  ?  ?  ?  ?  ?  ?  ?  ?  ?  ?  ? ?  ?Cognition Arousal/Alertness: Awake/alert ?Behavior During Therapy: [facility] for tasks assessed/performed ?Overall Cognitive Status: Within Functional Limits for tasks assessed ?  ?  ?  ?  ?  ?  ?  ?  ?  ?  ?  ?  ?  ?  ?  ?  ?  ?  ?  ? ?  ?Exercises Total Joint Exercises ?Ankle Circles/Pumps: AROM, 10 reps, Both, Supine ?Quad Sets: AROM, Both, 10 reps, Supine ?Long Arc Quad: AAROM, Right, 5 reps, Seated ?Knee Flexion: AAROM, Right, 5 reps, Seated ?Goniometric ROM: R knee 5 to 80 ?Other Exercises ?Other Exercises:  limited exercises and provided AAROM due to pain ? ?  ?General Comments General comments (skin integrity, edema, etc.): Pt on 1 L O2 with sats 96%.  Tried RA and sats 93% rest and 87% activity.  Replaced back on 1 L O2 at end of session ?  ?  ? ?Pertinent Vitals/Pain Pain Assessment ?Pain Assessment: 0-10 ?Pain Score: 5  (5/10 rest; 8/10 walk) ?Pain Location: R TKA ?Pain Descriptors / Indicators: Discomfort, Sore, Aching, Grimacing ?Pain Intervention(s): Monitored during session, Limited activity within  patient's tolerance, Premedicated before session, Ice applied, Repositioned  ? ? ?Home Living   ?  ?  ?  ?  ?  ?  ?  ?  ?  ?   ?  ?Prior Function    ?  ?  ?   ? ?PT Goals (current goals can now be found in the care plan section) Progress towards PT goals: Progressing toward goals ? ?  ?Frequency ? ? ? 7X/week ? ? ? ?  ?PT Plan Current plan remains appropriate  ? ? ?Co-evaluation   ?  ?  ?  ?  ? ?  ?AM-PAC PT "6 Clicks" Mobility   ?Outcome Measure ? Help needed turning from your back to your side while in a flat bed without using bedrails?: A Little ?Help needed moving from lying on your back to sitting on the side of a flat bed without using bedrails?: A Little ?Help needed moving to and from a bed to a chair (including a wheelchair)?: A Little ?Help needed standing up from a chair using your arms (e.g., wheelchair or bedside chair)?: A Little ?Help needed to walk in hospital room?: A Little ?Help needed climbing 3-5 steps with a railing? : A Lot ?6 Click Score: 17 ? ?  ?End of Session Equipment Utilized During Treatment: Gait belt ?Activity Tolerance: Patient limited by pain ?Patient left: with call bell/phone within reach;in chair;with chair alarm set;with family/visitor present ?Nurse Communication: Mobility status ?PT Visit Diagnosis: Other abnormalities of gait and mobility (R26.89);Muscle weakness (generalized) (M62.81) ?  ? ? ?Time: 1107-1130 ?PT Time Calculation (min) (ACUTE ONLY): 23 min ? ?Charges:  $Gait Training: 8-22 mins ?$Therapeutic Exercise: 8-22 mins          ?          ? ?Rebecca [name], PT ?Acute Rehab Services ?Pager [phone] ?[name] [phone] ? ? ? ?Rebecca [name] ?08/19/2021, 12:10 PM ? ?

## 2021-08-19 NOTE — Progress Notes (Signed)
Physical Therapy Treatment ?Patient Details ?Name: Rebecca [name] ?MRN: [id number] ?DOB: 1956/04/11 ?Today's Date: 08/19/2021 ? ? ?History of Present Illness Pt is 66 yo female s/p R TKA on 08/18/21.  Pt with hx including but not limited to sleep apnea, obesity, gastric banding, arthritis, HTN, and HLD. ? ?  ?PT Comments  ? ? Pt remains limited by pain and requiring O2.  She ambulated to bathroom and back but sats down to 83% on RA even with deep breaths and pt had c/o blurry vision - improved with O2.  BP was stable. Continue to recommend therapy prior to d/c home.  ?   ?Recommendations for follow up therapy are one component of a multi-disciplinary discharge planning process, led by the attending physician.  Recommendations may be updated based on patient status, additional functional criteria and insurance authorization. ? ?Follow Up Recommendations ? Follow physician's recommendations for discharge plan and follow up therapies ?  ?  ?Assistance Recommended at Discharge Intermittent Supervision/Assistance  ?Patient can return home with the following A little help with walking and/or transfers;A little help with bathing/dressing/bathroom;Assistance with cooking/housework ?  ?Equipment Recommendations ? Rolling walker (2 wheels)  ?  ?Recommendations for Other Services   ? ? ?  ?Precautions / Restrictions Precautions ?Precautions: Fall ?Required Braces or Orthoses: Knee Immobilizer - Right ?Knee Immobilizer - Right: Discontinue once straight leg raise with < 10 degree lag ?Restrictions ?Other Position/Activity Restrictions: WBAT  ?  ? ?Mobility ? Bed Mobility ?  ?  ?  ?  ?  ?  ?  ?General bed mobility comments: in recliner at arrival ?  ? ?Transfers ?Overall transfer level: Needs assistance ?Equipment used: Rolling walker (2 wheels) ?Transfers: Sit to/from Stand ?Sit to Stand: Min assist ?  ?  ?  ?  ?  ?General transfer comment: Cues for hand placement and R LE management - light min A to steady; performed x 3; min guard  with toielting ADLs ?  ? ?Ambulation/Gait ?Ambulation/Gait assistance: Min guard ?Gait Distance (Feet): 20 Feet (20'x2) ?Assistive device: Rolling walker (2 wheels) ?Gait Pattern/deviations: Step-to pattern, Decreased stride length, Decreased weight shift to right, Antalgic ?Gait velocity: decreased ?  ?  ?General Gait Details: Cues for sequencing and RW use; very antalgic, pt also becoming very winded, increased RR (see general comments) ? ? ?Stairs ?  ?  ?  ?  ?  ? ? ?Wheelchair Mobility ?  ? ?Modified Rankin (Stroke Patients Only) ?  ? ? ?  ?Balance Overall balance assessment: Needs assistance ?Sitting-balance support: No upper extremity supported ?Sitting balance-Leahy Scale: Good ?  ?  ?Standing balance support: Bilateral upper extremity supported, Reliant on assistive device for balance ?Standing balance-Leahy Scale: Poor ?Standing balance comment: Requiring RW but steady ?  ?  ?  ?  ?  ?  ?  ?  ?  ?  ?  ?  ? ?  ?Cognition Arousal/Alertness: Awake/alert ?Behavior During Therapy: [facility] for tasks assessed/performed ?Overall Cognitive Status: Within Functional Limits for tasks assessed ?  ?  ?  ?  ?  ?  ?  ?  ?  ?  ?  ?  ?  ?  ?  ?  ?  ?  ?  ? ?  ?Exercises Total Joint Exercises ?Ankle Circles/Pumps: AROM, 10 reps, Both, Supine ?Quad Sets: AROM, Both, 10 reps, Supine ?Hip ABduction/ADduction: AAROM, Right, 10 reps, Supine ?Long Arc Quad: AAROM, Right, 5 reps, Seated ?Knee Flexion: AAROM, Right, 5 reps, Seated ?Goniometric ROM:  R knee 5 to 80 ?Other Exercises ?Other Exercises: limited exercises and provided AAROM due to pain ? ?  ?General Comments General comments (skin integrity, edema, etc.): Pt on 1 L O2 rest.  Tried ambulation to bathroom with cues for deep breathing.  Upon sitting on toilet pt reports vision blurry.  Checked and O2 sats 83% -replaced O2 sats up to 96%.  EOEM intact, visual fields intact, pt legally blind L eye.  Limited ambulation to back to recliner due to pain and reports of blurry vision.   BP was stable 148/71 sitting and 160/70 standing.  Pt reports vision improved . NOtified RN ?  ?  ? ?Pertinent Vitals/Pain Pain Assessment ?Pain Assessment: 0-10 ?Pain Score: 7  ?Pain Location: R TKA ?Pain Descriptors / Indicators: Discomfort, Sore, Aching, Grimacing ?Pain Intervention(s): Limited activity within patient's tolerance, Monitored during session, Premedicated before session  ? ? ?Home Living   ?  ?  ?  ?  ?  ?  ?  ?  ?  ?   ?  ?Prior Function    ?  ?  ?   ? ?PT Goals (current goals can now be found in the care plan section) Progress towards PT goals: Progressing toward goals ? ?  ?Frequency ? ? ? 7X/week ? ? ? ?  ?PT Plan Current plan remains appropriate  ? ? ?Co-evaluation   ?  ?  ?  ?  ? ?  ?AM-PAC PT "6 Clicks" Mobility   ?Outcome Measure ? Help needed turning from your back to your side while in a flat bed without using bedrails?: A Little ?Help needed moving from lying on your back to sitting on the side of a flat bed without using bedrails?: A Little ?Help needed moving to and from a bed to a chair (including a wheelchair)?: A Little ?Help needed standing up from a chair using your arms (e.g., wheelchair or bedside chair)?: A Little ?Help needed to walk in hospital room?: A Little ?Help needed climbing 3-5 steps with a railing? : A Lot ?6 Click Score: 17 ? ?  ?End of Session Equipment Utilized During Treatment: Gait belt ?Activity Tolerance: Patient limited by pain ?Patient left: with call bell/phone within reach;in chair;with chair alarm set;with family/visitor present ?Nurse Communication: Mobility status ?PT Visit Diagnosis: Other abnormalities of gait and mobility (R26.89);Muscle weakness (generalized) (M62.81) ?  ? ? ?Time: 8295-6213 ?PT Time Calculation (min) (ACUTE ONLY): 28 min ? ?Charges:  $Gait Training: 8-22 mins ?$Therapeutic Exercise: 8-22 mins          ?          ? ?Abran Richard, PT ?Acute Rehab Services ?Pager [phone] ?[facility] [phone] ? ? ? ?[name] ?08/19/2021,  2:26 PM ? ?

## 2021-08-19 NOTE — TOC Transition Note (Signed)
Transition of Care (TOC) - CM/SW Discharge Note ? ?Patient Details  ?Name: COZETTE BRAGGS ?MRN: [id number] ?Date of Birth: 21-Nov-1955 ? ?Transition of Care (TOC) CM/SW Contact:  ?Sherie Don, LCSW ?Phone Number: ?08/19/2021, 9:48 AM ? ?Clinical Narrative: Patient is expected to discharge home after working with PT. CSW met with patient to review discharge plan and needs. Patient will discharge home with OPPT at [facility]. Patient has elevated toilets at home, but will need a rolling walker. Rebecca with [employer] delivered walker to patient's room. TOC signing off. ? ?Final next level of care: OP Rehab ?Barriers to Discharge: No Barriers Identified ? ?Patient Goals and CMS Choice ?Patient states their goals for this hospitalization and ongoing recovery are:: Discharge home with OPPT at [facility] ?CMS Medicare.gov Compare Post Acute Care list provided to:: Patient ?Choice offered to / list presented to : Patient ? ?Discharge Plan and Services        ?DME Arranged: Walker rolling ?DME Agency: [employer] ?Date DME Agency Contacted: 08/19/21 ?Representative spoke with at DME Agency: Wells Guiles ? ?Readmission Risk Interventions ?No flowsheet data found. ? ?

## 2021-08-19 NOTE — Progress Notes (Signed)
? ?Subjective: ?1 Day Post-Op Procedure(s) (LRB): ?TOTAL KNEE ARTHROPLASTY (Right) ?Patient reports pain as mild.   ?Patient seen in rounds by Dr. Wynelle Link. ?Patient is well, and has had no acute complaints or problems. Denies SOB, chest pain, or calf pain. No acute overnight events. Ambulated 40 ft. with therapy yesterday. Will continue therapy today.   ? ? ?Objective: ?Vital signs in last 24 hours: ?Temp:  [97.5 ?F (36.4 ?C)-98.6 ?F (37 ?C)] 98.3 ?F (36.8 ?C) (03/07 0530) ?Pulse Rate:  [68-94] 77 (03/07 0530) ?Resp:  [15-22] 16 (03/07 0530) ?BP: (119-157)/(63-82) 127/68 (03/07 0530) ?SpO2:  [93 %-100 %] 93 % (03/07 0530) ?Weight:  [90.2 kg] 90.2 kg (03/06 0936) ? ?Intake/Output from previous day: ? ?Intake/Output Summary (Last 24 hours) at 08/19/2021 0742 ?Last data filed at 08/19/2021 0645 ?Gross per 24 hour  ?Intake 2957.54 ml  ?Output 2585 ml  ?Net 372.54 ml  ?  ? ?Intake/Output this shift: ?No intake/output data recorded. ? ?Labs: ?Recent Labs  ?  08/19/21 ?0324  ?HGB 11.7*  ? ?Recent Labs  ?  08/19/21 ?0324  ?WBC 13.7*  ?RBC 3.80*  ?HCT 36.1  ?PLT 358  ? ?Recent Labs  ?  08/19/21 ?0324  ?NA 137  ?K 3.6  ?CL 103  ?CO2 27  ?BUN 22  ?CREATININE 1.09*  ?GLUCOSE 135*  ?CALCIUM 8.9  ? ?No results for input(s): LABPT, INR in the last 72 hours. ? ?Exam: ?General - Patient is Alert and Oriented ?Extremity - Neurologically intact ?Neurovascular intact ?Intact pulses distally ?Dorsiflexion/Plantar flexion intact ?Dressing - dressing C/D/I ?Motor Function - intact, moving foot and toes well on exam.  ? ?Past Medical History:  ?Diagnosis Date  ? Arthritis   ? Asthma   ? Blindness, legal   ? L EYE  ? GERD (gastroesophageal reflux disease)   ? Hearing loss   ? History of kidney stones   ? Hyperlipidemia   ? Hypertension   ? Lapband May 2013 11/18/2011  ? Migraines   ? Obesity, Class III, BMI 40-49.9 (morbid obesity) ([place]) 06/03/2011  ? Pre-diabetes   ? Sleep apnea   ? uses c-pap  ? ? ?Assessment/Plan: ?1 Day Post-Op  Procedure(s) (LRB): ?TOTAL KNEE ARTHROPLASTY (Right) ?Principal Problem: ?  OA (osteoarthritis) of knee ?Active Problems: ?  S/P total knee arthroplasty, right ? ?Estimated body mass index is 36.36 kg/m? as calculated from the following: ?  Height as of this encounter: '5\' 2"'$  (1.575 m). ?  Weight as of this encounter: 90.2 kg. ?Up with therapy ? ? ?Patient's anticipated LOS is less than 2 midnights, meeting these requirements: ?- Younger than 52 ?- Lives within 1 hour of care ?- Has a competent adult at home to recover with post-op recover ?- NO history of ? - Chronic pain requiring opiods ? - Diabetes ? - Coronary Artery Disease ? - Heart failure ? - Heart attack ? - Stroke ? - DVT/VTE ? - Cardiac arrhythmia ? - Respiratory Failure/COPD ? - Renal failure ? - Anemia ? - Advanced Liver disease ? ?  ? ?DVT Prophylaxis - Aspirin and TED hose ?Weight bearing as tolerated. ?Continue therapy. ? ?Plan is to go Home after hospital stay. ? ?Plan for two sessions with PT this morning, and if meeting goals, will plan for discharge this afternoon.  ? ?Patient to follow up in two weeks with Dr. Wynelle Link in clinic.  ? ?The PDMP database was reviewed today prior to any opioid medications being prescribed to this patient.. ? ? ?  Fenton Foy, MBA, PA-C ?Orthopedic Surgery ?08/19/2021, 7:42 AM  ?

## 2021-08-20 DIAGNOSIS — M1711 Unilateral primary osteoarthritis, right knee: Secondary | ICD-10-CM | POA: Diagnosis not present

## 2021-08-20 LAB — CBC
HCT: 34.8 % — ABNORMAL LOW (ref 36.0–46.0)
Hemoglobin: 11.9 g/dL — ABNORMAL LOW (ref 12.0–15.0)
MCH: 31.2 pg (ref 26.0–34.0)
MCHC: 34.2 g/dL (ref 30.0–36.0)
MCV: 91.1 fL (ref 80.0–100.0)
Platelets: UNDETERMINED 10*3/uL (ref 150–400)
RBC: 3.82 MIL/uL — ABNORMAL LOW (ref 3.87–5.11)
RDW: 13 % (ref 11.5–15.5)
WBC: 15 10*3/uL — ABNORMAL HIGH (ref 4.0–10.5)
nRBC: 0 % (ref 0.0–0.2)

## 2021-08-20 LAB — BASIC METABOLIC PANEL
Anion gap: 8 (ref 5–15)
BUN: 20 mg/dL (ref 8–23)
CO2: 25 mmol/L (ref 22–32)
Calcium: 9.2 mg/dL (ref 8.9–10.3)
Chloride: 106 mmol/L (ref 98–111)
Creatinine, Ser: 0.64 mg/dL (ref 0.44–1.00)
GFR, Estimated: >60 mL/min (ref >60)
Glucose, Bld: 121 mg/dL — ABNORMAL HIGH (ref 70–99)
Potassium: 3.8 mmol/L (ref 3.5–5.1)
Sodium: 139 mmol/L (ref 135–145)

## 2021-08-20 MED ORDER — HYDROMORPHONE HCL 2 MG PO TABS: 2.0000 mg | ORAL_TABLET | Freq: Four times a day (QID) | ORAL | 0 refills | Status: DC | PRN

## 2021-08-20 NOTE — Progress Notes (Signed)
Physical Therapy Treatment ?Patient Details ?Name: Rebecca [name] ?MRN: [id number] ?DOB: 05-16-1956 ?Today's Date: 08/20/2021 ? ? ?History of Present Illness Pt is 66 yo female s/p R TKA on 08/18/21.  Pt with hx including but not limited to sleep apnea, obesity, gastric banding, arthritis, HTN, and HLD. ? ?  ?PT Comments  ? ? Pt with slow progress and remains limited by pain.  She is not yet ambulating household distances.  Only doing 53' and at slow labored pace.  Session focused on mobility and gentle exercises.  Recommend further therapy prior to return home.  ?   ?Recommendations for follow up therapy are one component of a multi-disciplinary discharge planning process, led by the attending physician.  Recommendations may be updated based on patient status, additional functional criteria and insurance authorization. ? ?Follow Up Recommendations ? Follow physician's recommendations for discharge plan and follow up therapies ?  ?  ?Assistance Recommended at Discharge Intermittent Supervision/Assistance  ?Patient can return home with the following A little help with walking and/or transfers;A little help with bathing/dressing/bathroom;Assistance with cooking/housework ?  ?Equipment Recommendations ? Rolling walker (2 wheels)  ?  ?Recommendations for Other Services   ? ? ?  ?Precautions / Restrictions Precautions ?Precautions: Fall ?Required Braces or Orthoses: Knee Immobilizer - Right ?Knee Immobilizer - Right: Discontinue once straight leg raise with < 10 degree lag ?Restrictions ?RLE Weight Bearing: Weight bearing as tolerated  ?  ? ?Mobility ? Bed Mobility ?Overal bed mobility: Needs Assistance ?Bed Mobility: Supine to Sit, Sit to Supine ?  ?  ?Supine to sit: Min assist ?Sit to supine: Min assist ?  ?General bed mobility comments: min A for R LE and cues with increased time ?  ? ?Transfers ?Overall transfer level: Needs assistance ?Equipment used: Rolling walker (2 wheels) ?Transfers: Sit to/from Stand ?Sit to Stand:  Min assist ?  ?  ?  ?  ?  ?General transfer comment: Cues for hand placement and R LE management; stood from bed and toilet ?  ? ?Ambulation/Gait ?Ambulation/Gait assistance: Min guard ?Gait Distance (Feet): 45 Feet (10'x2 then 45') ?Assistive device: Rolling walker (2 wheels) ?Gait Pattern/deviations: Step-to pattern, Decreased stride length, Decreased weight shift to right, Antalgic ?Gait velocity: slow ?  ?  ?General Gait Details: Very antalgic with short step length, limited due to pain; during 45' bout did take 1 standing rest break ? ? ?Stairs ?  ?  ?  ?  ?  ? ? ?Wheelchair Mobility ?  ? ?Modified Rankin (Stroke Patients Only) ?  ? ? ?  ?Balance Overall balance assessment: Needs assistance ?Sitting-balance support: No upper extremity supported ?Sitting balance-Leahy Scale: Good ?  ?  ?Standing balance support: Bilateral upper extremity supported, Reliant on assistive device for balance, No upper extremity supported ?Standing balance-Leahy Scale: Fair ?Standing balance comment: Requiring RW but steady; stood at sink and brushed teeth without UE support ?  ?  ?  ?  ?  ?  ?  ?  ?  ?  ?  ?  ? ?  ?Cognition Arousal/Alertness: Awake/alert ?Behavior During Therapy: [facility] for tasks assessed/performed ?Overall Cognitive Status: Within Functional Limits for tasks assessed ?  ?  ?  ?  ?  ?  ?  ?  ?  ?  ?  ?  ?  ?  ?  ?  ?  ?  ?  ? ?  ?Exercises Total Joint Exercises ?Ankle Circles/Pumps: AROM, 10 reps, Both, Supine ?Quad Sets: AROM, Both, 10 reps,  Supine ?Heel Slides: AAROM, Right, 10 reps, Supine ?Hip ABduction/ADduction: AAROM, Right, 10 reps, Supine ?Long Arc Quad: AAROM, Right, Seated, 10 reps ?Knee Flexion: AAROM, Right, Seated, 10 reps ?Goniometric ROM: R knee 5 to 60 degrees ?Other Exercises ?Other Exercises: limited exercises and provided AAROM due to pain ? ?  ?General Comments General comments (skin integrity, edema, etc.): RN reports O2 dropped with supine so on 2 L.  With activity and sitting sats remained >94%  on RA, did replace O2 end of session ?  ?  ? ?Pertinent Vitals/Pain Pain Assessment ?Pain Assessment: 0-10 ?Pain Score: 7  ?Pain Location: R TKA ?Pain Descriptors / Indicators: Discomfort, Sore, Aching, Grimacing, Crying ?Pain Intervention(s): Limited activity within patient's tolerance, Monitored during session, Premedicated before session, Ice applied (pt reports little relief with oral meds)  ? ? ?Home Living   ?  ?  ?  ?  ?  ?  ?  ?  ?  ?   ?  ?Prior Function    ?  ?  ?   ? ?PT Goals (current goals can now be found in the care plan section) Progress towards PT goals: Progressing toward goals ? ?  ?Frequency ? ? ? 7X/week ? ? ? ?  ?PT Plan Current plan remains appropriate  ? ? ?Co-evaluation   ?  ?  ?  ?  ? ?  ?AM-PAC PT "6 Clicks" Mobility   ?Outcome Measure ? Help needed turning from your back to your side while in a flat bed without using bedrails?: A Little ?Help needed moving from lying on your back to sitting on the side of a flat bed without using bedrails?: A Little ?Help needed moving to and from a bed to a chair (including a wheelchair)?: A Little ?Help needed standing up from a chair using your arms (e.g., wheelchair or bedside chair)?: A Little ?Help needed to walk in hospital room?: A Little ?Help needed climbing 3-5 steps with a railing? : A Lot ?6 Click Score: 17 ? ?  ?End of Session Equipment Utilized During Treatment: Gait belt ?Activity Tolerance: Patient limited by pain ?Patient left: with call bell/phone within reach;with family/visitor present;in bed;with bed alarm set ?Nurse Communication: Mobility status ?PT Visit Diagnosis: Other abnormalities of gait and mobility (R26.89);Muscle weakness (generalized) (M62.81) ?  ? ? ?Time: 4332-9518 ?PT Time Calculation (min) (ACUTE ONLY): 23 min ? ?Charges:  $Gait Training: 8-22 mins ?$Therapeutic Exercise: 8-22 mins          ?          ? ?Abran Richard, PT ?Acute Rehab Services ?Pager [phone] ?[facility] [phone] ? ? ? ?Rebecca [name] ?08/20/2021,  4:21 PM ? ?

## 2021-08-20 NOTE — Progress Notes (Signed)
? ?  Subjective: ?2 Days Post-Op Procedure(s) (LRB): ?TOTAL KNEE ARTHROPLASTY (Right) ?Patient reports pain as mild.   ?Patient seen in rounds by Dr. Wynelle Link. ?Patient is well, and has had no acute complaints or problems. Denies SOB, chest pain, or calf pain. No acute overnight events. Ambulated 20 feet with therapy yesterday. Will continue therapy today.  ? ?Plan is to go Home after hospital stay. ? ?Objective: ?Vital signs in last 24 hours: ?Temp:  [97.7 ?F (36.5 ?C)-98.9 ?F (37.2 ?C)] 97.7 ?F (36.5 ?C) (03/08 0502) ?Pulse Rate:  [78-90] 78 (03/08 0502) ?Resp:  [16-20] 16 (03/08 0502) ?BP: (146-162)/(77-98) 162/77 (03/08 0502) ?SpO2:  [92 %-98 %] 92 % (03/08 0502) ? ?Intake/Output from previous day: ? ?Intake/Output Summary (Last 24 hours) at 08/20/2021 0718 ?Last data filed at 08/20/2021 0600 ?Gross per 24 hour  ?Intake 683.7 ml  ?Output --  ?Net 683.7 ml  ?  ?Intake/Output this shift: ?No intake/output data recorded. ? ?Labs: ?Recent Labs  ?  08/19/21 ?0324  ?HGB 11.7*  ? ?Recent Labs  ?  08/19/21 ?0324  ?WBC 13.7*  ?RBC 3.80*  ?HCT 36.1  ?PLT 358  ? ?Recent Labs  ?  08/19/21 ?0324  ?NA 137  ?K 3.6  ?CL 103  ?CO2 27  ?BUN 22  ?CREATININE 1.09*  ?GLUCOSE 135*  ?CALCIUM 8.9  ? ?No results for input(s): LABPT, INR in the last 72 hours. ? ?Exam: ?General - Patient is Alert and Oriented ?Extremity - Neurologically intact ?Neurovascular intact ?Intact pulses distally ?Dorsiflexion/Plantar flexion intact ?Dressing/Incision - clean, dry, no drainage ?Motor Function - intact, moving foot and toes well on exam.  ? ?Past Medical History:  ?Diagnosis Date  ? Arthritis   ? Asthma   ? Blindness, legal   ? L EYE  ? GERD (gastroesophageal reflux disease)   ? Hearing loss   ? History of kidney stones   ? Hyperlipidemia   ? Hypertension   ? Lapband May 2013 11/18/2011  ? Migraines   ? Obesity, Class III, BMI 40-49.9 (morbid obesity) ([place]) 06/03/2011  ? Pre-diabetes   ? Sleep apnea   ? uses c-pap  ? ? ?Assessment/Plan: ?2 Days Post-Op  Procedure(s) (LRB): ?TOTAL KNEE ARTHROPLASTY (Right) ?Principal Problem: ?  OA (osteoarthritis) of knee ?Active Problems: ?  S/P total knee arthroplasty, right ? ?Estimated body mass index is 36.36 kg/m? as calculated from the following: ?  Height as of this encounter: '5\' 2"'$  (1.575 m). ?  Weight as of this encounter: 90.2 kg. ?Up with therapy ? ?DVT Prophylaxis - Aspirin and TED hose ?Weight-bearing as tolerated ? ?Plan for two sessions with PT this morning, and if meeting goals, will plan for discharge this afternoon.  ? ?Patient to follow up in two weeks with Dr. Wynelle Link in clinic.  ? ?The PDMP database was reviewed today prior to any opioid medications being prescribed to this patient.. ? ? ?Fenton Foy, MBA, PA-C ?Orthopedic Surgery ?[phone] ?08/20/2021, 7:18 AM ? ?

## 2021-08-20 NOTE — Progress Notes (Signed)
Physical Therapy Treatment ?Patient Details ?Name: Rebecca [name] ?MRN: [id number] ?DOB: [birth year] ?Today's Date: 08/20/2021 ? ? ?History of Present Illness Pt is 66 yo female s/p R TKA on 08/18/21.  Pt with hx including but not limited to sleep apnea, obesity, gastric banding, arthritis, HTN, and HLD. ? ?  ?PT Comments  ? ? Pt with slow progress limited by pain. Did ambulate 79' today but fatigued easily and painful.  Pt not yet ambulating household distances. Will continue to progress as able but question if pt will be demonstrating mobility necessary to return home by this afternoon.  ?   ?Recommendations for follow up therapy are one component of a multi-disciplinary discharge planning process, led by the attending physician.  Recommendations may be updated based on patient status, additional functional criteria and insurance authorization. ? ?Follow Up Recommendations ? Follow physician's recommendations for discharge plan and follow up therapies ?  ?  ?Assistance Recommended at Discharge Intermittent Supervision/Assistance  ?Patient can return home with the following A little help with walking and/or transfers;A little help with bathing/dressing/bathroom;Assistance with cooking/housework ?  ?Equipment Recommendations ? Rolling walker (2 wheels)  ?  ?Recommendations for Other Services   ? ? ?  ?Precautions / Restrictions Precautions ?Precautions: Fall ?Required Braces or Orthoses: Knee Immobilizer - Right ?Knee Immobilizer - Right: Discontinue once straight leg raise with < 10 degree lag ?Restrictions ?RLE Weight Bearing: Weight bearing as tolerated  ?  ? ?Mobility ? Bed Mobility ?  ?  ?  ?  ?  ?  ?  ?General bed mobility comments: in recliner at arrival ?  ? ?Transfers ?Overall transfer level: Needs assistance ?Equipment used: Rolling walker (2 wheels) ?Transfers: Sit to/from Stand ?  ?  ?  ?  ?  ?  ?General transfer comment: Cues for hand placement and R LE management ?  ? ?Ambulation/Gait ?Ambulation/Gait  assistance: Min guard ?Gait Distance (Feet): 40 Feet ?Assistive device: Rolling walker (2 wheels) ?Gait Pattern/deviations: Step-to pattern, Decreased stride length, Decreased weight shift to right, Antalgic ?Gait velocity: slow ?  ?  ?General Gait Details: Antalgic and with one standing rest break.  first 37' at smoother pace but as pain increased more antalgic in last 63' ? ? ?Stairs ?  ?  ?  ?  ?  ? ? ?Wheelchair Mobility ?  ? ?Modified Rankin (Stroke Patients Only) ?  ? ? ?  ?Balance Overall balance assessment: Needs assistance ?Sitting-balance support: No upper extremity supported ?Sitting balance-Leahy Scale: Good ?  ?  ?Standing balance support: Bilateral upper extremity supported, Reliant on assistive device for balance ?Standing balance-Leahy Scale: Poor ?Standing balance comment: Requiring RW but steady ?  ?  ?  ?  ?  ?  ?  ?  ?  ?  ?  ?  ? ?  ?Cognition Arousal/Alertness: Awake/alert ?Behavior During Therapy: [facility] for tasks assessed/performed ?Overall Cognitive Status: Within Functional Limits for tasks assessed ?  ?  ?  ?  ?  ?  ?  ?  ?  ?  ?  ?  ?  ?  ?  ?  ?  ?  ?  ? ?  ?Exercises Total Joint Exercises ?Ankle Circles/Pumps: AROM, 10 reps, Both, Supine ?Quad Sets: AROM, Both, 10 reps, Supine ?Hip ABduction/ADduction: AAROM, Right, 10 reps, Supine ?Long Arc Quad: AAROM, Right, Seated, 10 reps ?Knee Flexion: AAROM, Right, Seated, 10 reps ?Goniometric ROM: R knee 5 to 70 ?Other Exercises ?Other Exercises: limited exercises and provided AAROM due  to pain ? ?  ?General Comments General comments (skin integrity, edema, etc.): On RA with sats 99% rest and 94% ambulate (of note no IV pain meds today), no complaints of blurriness ?  ?  ? ?Pertinent Vitals/Pain Pain Assessment ?Pain Assessment: 0-10 ?Pain Score: 6  ?Pain Location: R TKA ?Pain Descriptors / Indicators: Discomfort, Sore, Aching, Grimacing, Crying ?Pain Intervention(s): Limited activity within patient's tolerance, Monitored during session,  Premedicated before session, Ice applied  ? ? ?Home Living   ?  ?  ?  ?  ?  ?  ?  ?  ?  ?   ?  ?Prior Function    ?  ?  ?   ? ?PT Goals (current goals can now be found in the care plan section) Progress towards PT goals: Progressing toward goals ? ?  ?Frequency ? ? ? 7X/week ? ? ? ?  ?PT Plan Current plan remains appropriate  ? ? ?Co-evaluation   ?  ?  ?  ?  ? ?  ?AM-PAC PT "6 Clicks" Mobility   ?Outcome Measure ? Help needed turning from your back to your side while in a flat bed without using bedrails?: A Little ?Help needed moving from lying on your back to sitting on the side of a flat bed without using bedrails?: A Little ?Help needed moving to and from a bed to a chair (including a wheelchair)?: A Little ?Help needed standing up from a chair using your arms (e.g., wheelchair or bedside chair)?: A Little ?Help needed to walk in hospital room?: A Little ?Help needed climbing 3-5 steps with a railing? : A Lot ?6 Click Score: 17 ? ?  ?End of Session Equipment Utilized During Treatment: Gait belt ?Activity Tolerance: Patient limited by pain ?Patient left: with call bell/phone within reach;in chair;with chair alarm set;with family/visitor present ?Nurse Communication: Mobility status ?PT Visit Diagnosis: Other abnormalities of gait and mobility (R26.89);Muscle weakness (generalized) (M62.81) ?  ? ? ?Time: 1100-1130 ?PT Time Calculation (min) (ACUTE ONLY): 30 min ? ?Charges:  $Gait Training: 8-22 mins ?$Therapeutic Exercise: 8-22 mins          ?          ? ?Abran Richard, PT ?Acute Rehab Services ?Pager [phone] ?[facility] [phone] ? ? ? ?Rebecca [name] ?08/20/2021, 11:41 AM ? ?

## 2021-08-21 DIAGNOSIS — M1711 Unilateral primary osteoarthritis, right knee: Secondary | ICD-10-CM | POA: Diagnosis not present

## 2021-08-21 LAB — CBC
HCT: 35 % — ABNORMAL LOW (ref 36.0–46.0)
Hemoglobin: 11.4 g/dL — ABNORMAL LOW (ref 12.0–15.0)
MCH: 30.9 pg (ref 26.0–34.0)
MCHC: 32.6 g/dL (ref 30.0–36.0)
MCV: 94.9 fL (ref 80.0–100.0)
Platelets: 316 10*3/uL (ref 150–400)
RBC: 3.69 MIL/uL — ABNORMAL LOW (ref 3.87–5.11)
RDW: 13.2 % (ref 11.5–15.5)
WBC: 12.4 10*3/uL — ABNORMAL HIGH (ref 4.0–10.5)
nRBC: 0 % (ref 0.0–0.2)

## 2021-08-21 NOTE — Progress Notes (Signed)
Physical Therapy Treatment ?Patient Details ?Name: Rebecca [name] ?MRN: [id number] ?DOB: [birth year] ?Today's Date: 08/21/2021 ? ? ?History of Present Illness Pt is 66 yo female s/p R TKA on 08/18/21.  Pt with hx including but not limited to sleep apnea, obesity, gastric banding, arthritis, HTN, and HLD. ? ?  ?PT Comments  ? ? POD # 3 pm session with Spouse and Son present ?Educated on safe handling with transfers and gait.  Educated on walker use and est length.  Educated on HEP and use of ICE.   ?Pt ready to D/C to home. ?  ?Recommendations for follow up therapy are one component of a multi-disciplinary discharge planning process, led by the attending physician.  Recommendations may be updated based on patient status, additional functional criteria and insurance authorization. ? ?Follow Up Recommendations ? Follow physician's recommendations for discharge plan and follow up therapies ?  ?  ?Assistance Recommended at Discharge Intermittent Supervision/Assistance  ?Patient can return home with the following A little help with walking and/or transfers;A little help with bathing/dressing/bathroom;Assistance with cooking/housework ?  ?Equipment Recommendations ? Rolling walker (2 wheels);BSC/3in1  ?  ?Recommendations for Other Services   ? ? ?  ?Precautions / Restrictions Precautions ?Precautions: Fall ?Precaution Comments: instructed no pillow under knee ?Restrictions ?Weight Bearing Restrictions: No ?RLE Weight Bearing: Weight bearing as tolerated  ?  ? ?Mobility ? Bed Mobility ?Overal bed mobility: Needs Assistance ?Bed Mobility: Supine to Sit ?  ?  ?Supine to sit: Min assist ?  ?  ?General bed mobility comments: OOB in recliner ?  ? ?Transfers ?Overall transfer level: Needs assistance ?Equipment used: Rolling walker (2 wheels) ?Transfers: Sit to/from Stand, Bed to chair/wheelchair/BSC ?Sit to Stand: Supervision ?  ?  ?  ?  ?  ?General transfer comment: 25% VC's on proper hand placement and increased time to rise from  recliner and toilet.  Pt able to self don/doff pants ?  ? ?Ambulation/Gait ?Ambulation/Gait assistance: Supervision ?Gait Distance (Feet): 28 Feet ?Assistive device: Rolling walker (2 wheels) ?Gait Pattern/deviations: Step-to pattern, Decreased stride length, Decreased weight shift to right, Antalgic ?Gait velocity: decreased ?  ?  ?General Gait Details: tolerated amb a functional distance to and from bathroom with walker at Supervision level. ? ? ?Stairs ?Stairs:  (RAMP) ?  ?  ?  ?  ? ? ?Wheelchair Mobility ?  ? ?Modified Rankin (Stroke Patients Only) ?  ? ? ?  ?Balance   ?  ?  ?  ?  ?  ?  ?  ?  ?  ?  ?  ?  ?  ?  ?  ?  ?  ?  ?  ? ?  ?Cognition Arousal/Alertness: Awake/alert ?Behavior During Therapy: [facility] for tasks assessed/performed ?Overall Cognitive Status: Within Functional Limits for tasks assessed ?  ?  ?  ?  ?  ?  ?  ?  ?  ?  ?  ?  ?  ?  ?  ?  ?General Comments: AxO x 3 very pleasant ?  ?  ? ?  ?Exercises   ? ?  ?General Comments   ?  ?  ? ?Pertinent Vitals/Pain Pain Assessment ?Pain Assessment: Faces ?Pain Score: 8  ?Faces Pain Scale: Hurts little more ?Pain Location: R knee ?Pain Descriptors / Indicators: Discomfort, Sore, Aching, Grimacing, Crying ?Pain Intervention(s): Monitored during session, Repositioned, Ice applied, Premedicated before session  ? ? ?Home Living   ?  ?  ?  ?  ?  ?  ?  ?  ?  ?   ?  ?  Prior Function    ?  ?  ?   ? ?PT Goals (current goals can now be found in the care plan section) Progress towards PT goals: Progressing toward goals ? ?  ?Frequency ? ? ?   ? ? ? ?  ?PT Plan Current plan remains appropriate  ? ? ?Co-evaluation   ?  ?  ?  ?  ? ?  ?AM-PAC PT "6 Clicks" Mobility   ?Outcome Measure ? Help needed turning from your back to your side while in a flat bed without using bedrails?: A Little ?Help needed moving from lying on your back to sitting on the side of a flat bed without using bedrails?: A Little ?Help needed moving to and from a bed to a chair (including a wheelchair)?: A  Little ?Help needed standing up from a chair using your arms (e.g., wheelchair or bedside chair)?: A Little ?Help needed to walk in hospital room?: A Little ?Help needed climbing 3-5 steps with a railing? : A Little ?6 Click Score: 18 ? ?  ?End of Session Equipment Utilized During Treatment: Gait belt ?Activity Tolerance: Patient tolerated treatment well ?Patient left: in chair;with call bell/phone within reach;with chair alarm set ?Nurse Communication: Mobility status ?PT Visit Diagnosis: Other abnormalities of gait and mobility (R26.89);Muscle weakness (generalized) (M62.81) ?  ? ? ?Time: 1517-6160 ?PT Time Calculation (min) (ACUTE ONLY): 36 min ? ?Charges:  $Gait Training: 8-22 mins ?$Therapeutic Activity: 8-22 mins          ?          ? ?{Nolen Lindamood  PTA ?Acute  Rehabilitation Services ?Pager      [phone] ?Office      [phone] ? ? ?

## 2021-08-21 NOTE — Progress Notes (Signed)
? ?  Subjective: ?3 Days Post-Op Procedure(s) (LRB): ?TOTAL KNEE ARTHROPLASTY (Right) ?Patient reports pain as mild.   ?Patient seen in rounds by Dr. Wynelle Link. ?Patient is well, and has had no acute complaints or problems. Denies SOB, chest pain, or calf pain. No acute overnight events. Ambulated 45 feet with therapy yesterday. Will continue therapy today.  ? ?Plan is to go Home after hospital stay. ? ?Objective: ?Vital signs in last 24 hours: ?Temp:  [98.2 ?F (36.8 ?C)-98.9 ?F (37.2 ?C)] 98.2 ?F (36.8 ?C) (03/08 2122) ?Pulse Rate:  [77-87] 82 (03/09 0554) ?Resp:  [17-20] 17 (03/09 0554) ?BP: (148-157)/(74-98) 156/84 (03/09 0554) ?SpO2:  [90 %-99 %] 96 % (03/09 0733) ? ?Intake/Output from previous day: ? ?Intake/Output Summary (Last 24 hours) at 08/21/2021 0810 ?Last data filed at 08/21/2021 0554 ?Gross per 24 hour  ?Intake 660 ml  ?Output --  ?Net 660 ml  ?  ?Intake/Output this shift: ?No intake/output data recorded. ? ?Labs: ?Recent Labs  ?  08/19/21 ?0324 08/20/21 ?0705 08/21/21 ?0320  ?HGB 11.7* 11.9* 11.4*  ? ?Recent Labs  ?  08/20/21 ?0705 08/21/21 ?0320  ?WBC 15.0* 12.4*  ?RBC 3.82* 3.69*  ?HCT 34.8* 35.0*  ?PLT PLATELET CLUMPS NOTED ON SMEAR, UNABLE TO ESTIMATE 316  ? ?Recent Labs  ?  08/19/21 ?0324 08/20/21 ?0705  ?NA 137 139  ?K 3.6 3.8  ?CL 103 106  ?CO2 27 25  ?BUN 22 20  ?CREATININE 1.09* 0.64  ?GLUCOSE 135* 121*  ?CALCIUM 8.9 9.2  ? ?No results for input(s): LABPT, INR in the last 72 hours. ? ?Exam: ?General - Patient is Alert and Oriented ?Extremity - Neurologically intact ?Neurovascular intact ?Intact pulses distally ?Dorsiflexion/Plantar flexion intact ?Dressing/Incision - clean, dry, no drainage ?Motor Function - intact, moving foot and toes well on exam.  ? ?Past Medical History:  ?Diagnosis Date  ? Arthritis   ? Asthma   ? Blindness, legal   ? L EYE  ? GERD (gastroesophageal reflux disease)   ? Hearing loss   ? History of kidney stones   ? Hyperlipidemia   ? Hypertension   ? Lapband May 2013 11/18/2011   ? Migraines   ? Obesity, Class III, BMI 40-49.9 (morbid obesity) ([place]) 06/03/2011  ? Pre-diabetes   ? Sleep apnea   ? uses c-pap  ? ? ?Assessment/Plan: ?3 Days Post-Op Procedure(s) (LRB): ?TOTAL KNEE ARTHROPLASTY (Right) ?Principal Problem: ?  OA (osteoarthritis) of knee ?Active Problems: ?  S/P total knee arthroplasty, right ? ?Estimated body mass index is 36.36 kg/m? as calculated from the following: ?  Height as of this encounter: '5\' 2"'$  (1.575 m). ?  Weight as of this encounter: 90.2 kg. ?Up with therapy ? ?DVT Prophylaxis - Aspirin and TED hose ?Weight-bearing as tolerated ? ?Plan for two sessions with PT this morning, and if meeting goals, will plan for discharge this afternoon.  ? ?Patient to follow up in two weeks with Dr. Wynelle Link in clinic.  ? ?The PDMP database was reviewed today prior to any opioid medications being prescribed to this patient.. ? ? ?Fenton Foy, MBA, PA-C ?Orthopedic Surgery ?[phone] ?08/21/2021, 8:10 AM ? ?

## 2021-08-21 NOTE — Plan of Care (Signed)
  Problem: Education: Goal: Knowledge of General Education information will improve Description: Including pain rating scale, medication(s)/side effects and non-pharmacologic comfort measures Outcome: Progressing   Problem: Activity: Goal: Risk for activity intolerance will decrease Outcome: Progressing   Problem: Pain Managment: Goal: General experience of comfort will improve Outcome: Progressing   

## 2021-08-21 NOTE — Progress Notes (Signed)
DME walker and commode sent home with patient ? ?

## 2021-08-21 NOTE — Progress Notes (Signed)
Physical Therapy Treatment ?Patient Details ?Name: Rebecca [name] ?MRN: [id number] ?DOB: 07/02/1955 ?Today's Date: 08/21/2021 ? ? ?History of Present Illness Pt is 66 yo female s/p R TKA on 08/18/21.  Pt with hx including but not limited to sleep apnea, obesity, gastric banding, arthritis, HTN, and HLD. ? ?  ?PT Comments  ? ? POD # 3 am session ?Assisted OOB required increased time and effort.  Pt plans to sleep in her recliner at home.  Assisted with BSC.  General transfer comment: 50% VC's on proper hand placement and increased time to rise and pivot 1/4 from bed to [facility].  Due to urgency to void and slow movement, rec BSC esp for HS.  Assisted with amb.  General Gait Details: tolerated amb a functional distance of 52 feet in hallway with 25% VC's on safety with turns. Pain 8/10 despite premedicated but tolerated distance well.   ?Would like family present during her PM session in expectation for D/C today.  Per pt, spouse amb on a cane and he needs surgery and Son works.  She was able to speak to her Spouse and agreed on 4 pm today.    ?Recommendations for follow up therapy are one component of a multi-disciplinary discharge planning process, led by the attending physician.  Recommendations may be updated based on patient status, additional functional criteria and insurance authorization. ? ?Follow Up Recommendations ? Follow physician's recommendations for discharge plan and follow up therapies ?  ?  ?Assistance Recommended at Discharge Intermittent Supervision/Assistance  ?Patient can return home with the following A little help with walking and/or transfers;A little help with bathing/dressing/bathroom;Assistance with cooking/housework ?  ?Equipment Recommendations ? Rolling walker (2 wheels);BSC/3in1  ?  ?Recommendations for Other Services   ? ? ?  ?Precautions / Restrictions Precautions ?Precaution Comments: instructed no pillow under knee ?Restrictions ?Weight Bearing Restrictions: No ?RLE Weight Bearing: Weight  bearing as tolerated  ?  ? ?Mobility ? Bed Mobility ?Overal bed mobility: Needs Assistance ?Bed Mobility: Supine to Sit ?  ?  ?Supine to sit: Min assist ?  ?  ?General bed mobility comments: min A for R LE and cues with increased time.  Difficulty self scooting to EOB. ?  ? ?Transfers ?Overall transfer level: Needs assistance ?Equipment used: Rolling walker (2 wheels) ?Transfers: Sit to/from Stand, Bed to chair/wheelchair/BSC ?Sit to Stand: Supervision, Min guard ?  ?  ?  ?  ?  ?General transfer comment: 50% VC's on proper hand placement and increased time to rise and pivot 1/4 from bed to [facility]. ?  ? ?Ambulation/Gait ?Ambulation/Gait assistance: Supervision, Min guard ?Gait Distance (Feet): 52 Feet ?Assistive device: Rolling walker (2 wheels) ?Gait Pattern/deviations: Step-to pattern, Decreased stride length, Decreased weight shift to right, Antalgic ?Gait velocity: decreased ?  ?  ?General Gait Details: tolerated amb a functional distance of 52 feet in hallway with 25% VC's on safety with turns. ? ? ?Stairs ?Stairs:  (has a RAMP) ?  ?  ?  ?  ? ? ?Wheelchair Mobility ?  ? ?Modified Rankin (Stroke Patients Only) ?  ? ? ?  ?Balance   ?  ?  ?  ?  ?  ?  ?  ?  ?  ?  ?  ?  ?  ?  ?  ?  ?  ?  ?  ? ?  ?Cognition Arousal/Alertness: Awake/alert ?Behavior During Therapy: [facility] for tasks assessed/performed ?Overall Cognitive Status: Within Functional Limits for tasks assessed ?  ?  ?  ?  ?  ?  ?  ?  ?  ?  ?  ?  ?  ?  ?  ?  ?  General Comments: AxO x 3 very pleasant ?  ?  ? ?  ?Exercises   ? ?  ?General Comments   ?  ?  ? ?Pertinent Vitals/Pain Pain Assessment ?Pain Assessment: 0-10 ?Pain Score: 8  ?Pain Location: R knee ?Pain Descriptors / Indicators: Discomfort, Sore, Aching, Grimacing, Crying ?Pain Intervention(s): Monitored during session, Premedicated before session, Repositioned, Ice applied  ? ? ?Home Living   ?  ?  ?  ?  ?  ?  ?  ?  ?  ?   ?  ?Prior Function    ?  ?  ?   ? ?PT Goals (current goals can now be found in the care  plan section) Progress towards PT goals: Progressing toward goals ? ?  ?Frequency ? ? ? 7X/week ? ? ? ?  ?PT Plan Current plan remains appropriate  ? ? ?Co-evaluation   ?  ?  ?  ?  ? ?  ?AM-PAC PT "6 Clicks" Mobility   ?Outcome Measure ? Help needed turning from your back to your side while in a flat bed without using bedrails?: A Little ?Help needed moving from lying on your back to sitting on the side of a flat bed without using bedrails?: A Little ?Help needed moving to and from a bed to a chair (including a wheelchair)?: A Little ?Help needed standing up from a chair using your arms (e.g., wheelchair or bedside chair)?: A Little ?Help needed to walk in hospital room?: A Little ?Help needed climbing 3-5 steps with a railing? : A Little ?6 Click Score: 18 ? ?  ?End of Session Equipment Utilized During Treatment: Gait belt ?Activity Tolerance: Patient tolerated treatment well ?Patient left: in chair;with call bell/phone within reach;with chair alarm set ?Nurse Communication: Mobility status ?PT Visit Diagnosis: Other abnormalities of gait and mobility (R26.89);Muscle weakness (generalized) (M62.81) ?  ? ? ?Time: 2725-3664 ?PT Time Calculation (min) (ACUTE ONLY): 29 min ? ?Charges:  $Gait Training: 8-22 mins ?$Therapeutic Activity: 8-22 mins          ?          ?Rica Koyanagi  PTA ?Acute  Rehabilitation Services ?Pager      [phone] ?Office      [phone] ? ?

## 2021-08-21 NOTE — TOC Progression Note (Signed)
Transition of Care (TOC) - Progression Note  ? ?Patient Details  ?Name: Rebecca [name] ?MRN: [id number] ?Date of Birth: [birth year] ? ?Transition of Care (TOC) CM/SW Contact  ?Sherie Don, LCSW ?Phone Number: ?08/21/2021, 12:43 PM ? ?Clinical Narrative: CSW notified patient will now need a 3N1. CSW spoke with patient and confirmed patient has not received one in the past 5 years. Patient agreeable to referral to [employer]. CSW made DME referral to [place] with [employer]. [employer] to deliver 3N1 to patient's room. ? ?Barriers to Discharge: No Barriers Identified ? ?Expected Discharge Plan and Services ?Expected Discharge Date: 08/21/21               ?DME Arranged: Walker rolling, 3-N-1 ?DME Agency: [employer] [employer] ?Date DME Agency Contacted: 08/21/21 ?Time DME Agency Contacted: [phone] ?Representative spoke with at DME Agency: Andee Poles ? ?Readmission Risk Interventions ?No flowsheet data found. ? ?

## 2021-09-01 NOTE — Discharge Summary (Signed)
Physician Discharge Summary  ? ?Patient ID: ?Rebecca [name] ?MRN: [id number] ?DOB/AGE: 68-13-1957 68 y.o. ? ?Admit date: 08/18/2021 ?Discharge date: 08/21/2021 ? ?Primary Diagnosis: Osteoarthritis of Right Knee ? ?Admission Diagnoses:  ?Past Medical History:  ?Diagnosis Date  ? Arthritis   ? Asthma   ? Blindness, legal   ? L EYE  ? GERD (gastroesophageal reflux disease)   ? Hearing loss   ? History of kidney stones   ? Hyperlipidemia   ? Hypertension   ? Lapband May 2013 11/18/2011  ? Migraines   ? Obesity, Class III, BMI 40-49.9 (morbid obesity) ([place]) 06/03/2011  ? Pre-diabetes   ? Sleep apnea   ? uses c-pap  ? ?Discharge Diagnoses:   ?Principal Problem: ?  OA (osteoarthritis) of knee ?Active Problems: ?  S/P total knee arthroplasty, right ? ?Estimated body mass index is 36.36 kg/m? as calculated from the following: ?  Height as of this encounter: '5\' 2"'$  (1.575 m). ?  Weight as of this encounter: 90.2 kg. ? ?Procedure:  ?Procedure(s) (LRB): ?TOTAL KNEE ARTHROPLASTY (Right)  ? ?Consults: None ? ?HPI:  Rebecca [name] is a 66 y.o. year old female with end stage OA of her right knee with progressively worsening pain and dysfunction. She has constant pain, with activity and at rest and significant functional deficits with difficulties even with ADLs. She has had extensive non-op management including analgesics, injections of cortisone and viscosupplements, and home exercise program, but remains in significant pain with significant dysfunction.Radiographs show bone on bone arthritis medial and patellofemoral. She presents now for right Total Knee Arthroplasty.    ? ?Laboratory Data: ?Admission on 08/18/2021, Discharged on 08/21/2021  ?Component Date Value Ref Range Status  ? WBC 08/19/2021 13.7 (H)  4.0 - 10.5 K/uL Final  ? RBC 08/19/2021 3.80 (L)  3.87 - 5.11 MIL/uL Final  ? Hemoglobin 08/19/2021 11.7 (L)  12.0 - 15.0 g/dL Final  ? HCT 08/19/2021 36.1  36.0 - 46.0 % Final  ? MCV 08/19/2021 95.0  80.0 - 100.0 fL Final  ? MCH  08/19/2021 30.8  26.0 - 34.0 pg Final  ? MCHC 08/19/2021 32.4  30.0 - 36.0 g/dL Final  ? RDW 08/19/2021 13.0  11.5 - 15.5 % Final  ? Platelets 08/19/2021 358  150 - 400 K/uL Final  ? nRBC 08/19/2021 0.0  0.0 - 0.2 % Final  ? Performed at [facility], [place] [address] [place], [place] [address]  ? Sodium 08/19/2021 137  135 - 145 mmol/L Final  ? Potassium 08/19/2021 3.6  3.5 - 5.1 mmol/L Final  ? Chloride 08/19/2021 103  98 - 111 mmol/L Final  ? CO2 08/19/2021 27  22 - 32 mmol/L Final  ? Glucose, Bld 08/19/2021 135 (H)  70 - 99 mg/dL Final  ? Glucose reference range applies only to samples taken after fasting for at least 8 hours.  ? BUN 08/19/2021 22  8 - 23 mg/dL Final  ? Creatinine, Ser 08/19/2021 1.09 (H)  0.44 - 1.00 mg/dL Final  ? Calcium 08/19/2021 8.9  8.9 - 10.3 mg/dL Final  ? GFR, Estimated 08/19/2021 56 (L)  >60 mL/min Final  ? Comment: (NOTE) ?Calculated using the CKD-EPI Creatinine Equation (2021) ?  ? Anion gap 08/19/2021 7  5 - 15 Final  ? Performed at [facility], [place] [address] [place], [place] [address]  ? WBC 08/20/2021 15.0 (H)  4.0 - 10.5 K/uL Final  ? RBC 08/20/2021 3.82 (L)  3.87 -  5.11 MIL/uL Final  ? Hemoglobin 08/20/2021 11.9 (L)  12.0 - 15.0 g/dL Final  ? HCT 08/20/2021 34.8 (L)  36.0 - 46.0 % Final  ? MCV 08/20/2021 91.1  80.0 - 100.0 fL Final  ? MCH 08/20/2021 31.2  26.0 - 34.0 pg Final  ? MCHC 08/20/2021 34.2  30.0 - 36.0 g/dL Final  ? RDW 08/20/2021 13.0  11.5 - 15.5 % Final  ? Platelets 08/20/2021 PLATELET CLUMPS NOTED ON SMEAR, UNABLE TO ESTIMATE  150 - 400 K/uL Final  ? nRBC 08/20/2021 0.0  0.0 - 0.2 % Final  ? Performed at [facility], [place] [address] [place], [place] [address]  ? Sodium 08/20/2021 139  135 - 145 mmol/L Final  ? Potassium 08/20/2021 3.8  3.5 - 5.1 mmol/L Final  ? Chloride 08/20/2021 106  98 - 111 mmol/L Final  ? CO2 08/20/2021 25  22 - 32 mmol/L Final  ? Glucose, Bld 08/20/2021 121 (H)  70 - 99 mg/dL Final   ? Glucose reference range applies only to samples taken after fasting for at least 8 hours.  ? BUN 08/20/2021 20  8 - 23 mg/dL Final  ? Creatinine, Ser 08/20/2021 0.64  0.44 - 1.00 mg/dL Final  ? Calcium 08/20/2021 9.2  8.9 - 10.3 mg/dL Final  ? GFR, Estimated 08/20/2021 >60  >60 mL/min Final  ? Comment: (NOTE) ?Calculated using the CKD-EPI Creatinine Equation (2021) ?  ? Anion gap 08/20/2021 8  5 - 15 Final  ? Performed at [facility], [place] [address], [place], [place] [address]  ? WBC 08/21/2021 12.4 (H)  4.0 - 10.5 K/uL Final  ? RBC 08/21/2021 3.69 (L)  3.87 - 5.11 MIL/uL Final  ? Hemoglobin 08/21/2021 11.4 (L)  12.0 - 15.0 g/dL Final  ? HCT 08/21/2021 35.0 (L)  36.0 - 46.0 % Final  ? MCV 08/21/2021 94.9  80.0 - 100.0 fL Final  ? MCH 08/21/2021 30.9  26.0 - 34.0 pg Final  ? MCHC 08/21/2021 32.6  30.0 - 36.0 g/dL Final  ? RDW 08/21/2021 13.2  11.5 - 15.5 % Final  ? Platelets 08/21/2021 316  150 - 400 K/uL Final  ? nRBC 08/21/2021 0.0  0.0 - 0.2 % Final  ? Performed at [facility], [place] [address] [place], [place] [address]  ?Hospital Outpatient Visit on 08/14/2021  ?Component Date Value Ref Range Status  ? SARS Coronavirus 2 08/14/2021 NEGATIVE  NEGATIVE Final  ? Comment: (NOTE) ?SARS-CoV-2 target nucleic acids are NOT DETECTED. ? ?The SARS-CoV-2 RNA is generally detectable in upper and lower ?respiratory specimens during the acute phase of infection. Negative ?results do not preclude SARS-CoV-2 infection, do not rule out ?co-infections with other pathogens, and should not be used as the ?sole basis for treatment or other patient management decisions. ?Negative results must be combined with clinical observations, ?patient history, and epidemiological information. The expected ?result is Negative. ? ?Fact Sheet for Patients: ?SugarRoll.be ? ?Fact Sheet for Healthcare Providers: ?https://www.woods-mathews.com/ ? ?This test is not yet  approved or cleared by the [place] FDA and  ?has been authorized for detection and/or diagnosis of SARS-CoV-2 by ?FDA under an Emergency Use Authorization (EUA). This EUA will remain  ?in effect (meaning this test can be used) for the duration of the ?COVID-19 declaration under Se  ?                        ction 564(b)(1) of  the Act, 21 U.S.C. ?section 360bbb-3(b)(1), unless the authorization is terminated or ?revoked sooner. ? ?Performed at [facility], [place] [address] [place], [place] ?[address] ?  ?Hospital Outpatient Visit on 08/07/2021  ?Component Date Value Ref Range Status  ? MRSA, PCR 08/07/2021 NEGATIVE  NEGATIVE Final  ? Staphylococcus aureus 08/07/2021 NEGATIVE  NEGATIVE Final  ? Comment: (NOTE) ?The Xpert SA Assay (FDA approved for NASAL specimens in patients 64 ?years of age and older), is one component of a comprehensive ?surveillance program. It is not intended to diagnose infection nor to ?guide or monitor treatment. ?Performed at [facility], [place] [address] ?[place], [place] [address] ?  ? WBC 08/07/2021 7.2  4.0 - 10.5 K/uL Final  ? RBC 08/07/2021 4.00  3.87 - 5.11 MIL/uL Final  ? Hemoglobin 08/07/2021 12.4  12.0 - 15.0 g/dL Final  ? HCT 08/07/2021 36.9  36.0 - 46.0 % Final  ? MCV 08/07/2021 92.3  80.0 - 100.0 fL Final  ? MCH 08/07/2021 31.0  26.0 - 34.0 pg Final  ? MCHC 08/07/2021 33.6  30.0 - 36.0 g/dL Final  ? RDW 08/07/2021 12.6  11.5 - 15.5 % Final  ? Platelets 08/07/2021 402 (H)  150 - 400 K/uL Final  ? nRBC 08/07/2021 0.0  0.0 - 0.2 % Final  ? Performed at [facility], [place] [address], [place], [place] [address]  ? Sodium 08/07/2021 139  135 - 145 mmol/L Final  ? Potassium 08/07/2021 4.0  3.5 - 5.1 mmol/L Final  ? Chloride 08/07/2021 107  98 - 111 mmol/L Final  ? CO2 08/07/2021 26  22 - 32 mmol/L Final  ? Glucose, Bld 08/07/2021 92  70 - 99 mg/dL Final  ? Glucose reference range applies only to samples taken after fasting for at least 8  hours.  ? BUN 08/07/2021 17  8 - 23 mg/dL Final  ? Creatinine, Ser 08/07/2021 0.84  0.44 - 1.00 mg/dL Final  ? Calcium 08/07/2021 9.4  8.9 - 10.3 mg/dL Final  ? Total Protein 08/07/2021 7.2  6.5 - 8.1 g/dL

## 2021-09-06 ENCOUNTER — Other Ambulatory Visit: Payer: Self-pay | Admitting: Family Medicine

## 2021-09-08 ENCOUNTER — Telehealth: Payer: Self-pay | Admitting: Family Medicine

## 2021-09-08 ENCOUNTER — Ambulatory Visit: Payer: 59 | Admitting: Family Medicine

## 2021-09-08 MED ORDER — LISINOPRIL 30 MG PO TABS: 30.0000 mg | ORAL_TABLET | Freq: Every day | ORAL | 0 refills | Status: DC

## 2021-09-08 NOTE — Telephone Encounter (Signed)
Pt has an appt on 09-18-2021 and would like a refill on lisinopril (ZESTRIL) 30 MG tablet  ?[id number] [place], [place] - [address] Phone:  [phone] ?Fax:  [phone] ?  ? ?

## 2021-09-17 ENCOUNTER — Ambulatory Visit: Payer: 59 | Admitting: Family Medicine

## 2021-09-18 ENCOUNTER — Ambulatory Visit (INDEPENDENT_AMBULATORY_CARE_PROVIDER_SITE_OTHER): Payer: 59 | Admitting: Family Medicine

## 2021-09-18 VITALS — BP 149/97 | HR 88 | Temp 99.4°F | Wt 195.6 lb

## 2021-09-18 DIAGNOSIS — I10 Essential (primary) hypertension: Secondary | ICD-10-CM | POA: Diagnosis not present

## 2021-09-18 DIAGNOSIS — M62838 Other muscle spasm: Secondary | ICD-10-CM | POA: Diagnosis not present

## 2021-09-18 DIAGNOSIS — Z96651 Presence of right artificial knee joint: Secondary | ICD-10-CM | POA: Diagnosis not present

## 2021-09-18 MED ORDER — METHOCARBAMOL 500 MG PO TABS: 500.0000 mg | ORAL_TABLET | Freq: Four times a day (QID) | ORAL | 0 refills | Status: DC | PRN

## 2021-09-18 NOTE — Progress Notes (Signed)
Subjective:  ? ? Patient ID: Rebecca [name], female    DOB: 05-16-1956, 66 y.o.   MRN: [id number] ? ?Chief Complaint  ?Patient presents with  ? Follow-up  ?  BP pre DM blurred vision  ? ? ?HPI ?Patient was seen today for f/u.  Pt s/p R TKR 08/18/21.  Recovery s/p R TKR has been difficult 2/2 pain, spasms, and burning sensation in RLE.  Pt took meds rx'd and has been taking Tylenol 1000 mg.  States discomfort has been constant, only mildly improved over the last 2 days.  Had f/u with Ortho.   ? ?Taking lisinopril 30 mg daily for blood pressure. ? ?Past Medical History:  ?Diagnosis Date  ? Arthritis   ? Asthma   ? Blindness, legal   ? L EYE  ? GERD (gastroesophageal reflux disease)   ? Hearing loss   ? History of kidney stones   ? Hyperlipidemia   ? Hypertension   ? Lapband May 2013 11/18/2011  ? Migraines   ? Obesity, Class III, BMI 40-49.9 (morbid obesity) ([place]) 06/03/2011  ? Pre-diabetes   ? Sleep apnea   ? uses c-pap  ? ? ?Allergies  ?Allergen Reactions  ? Hydrocodone Hives, Shortness Of Breath and Itching  ? Sulfa Antibiotics Hives  ? ? ?ROS ?General: Denies fever, chills, night sweats, changes in weight, changes in appetite ?HEENT: Denies headaches, ear pain, changes in vision, rhinorrhea, sore throat ?CV: Denies CP, palpitations, SOB, orthopnea ?Pulm: Denies SOB, cough, wheezing ?GI: Denies abdominal pain, nausea, vomiting, diarrhea, constipation ?GU: Denies dysuria, hematuria, frequency, vaginal discharge ?Msk: Denies muscle cramps, joint pains  +s/p R TKR, muscle spasm ?Neuro: Denies weakness, numbness, tingling  +burning in R knee ?Skin: Denies rashes, bruising ?Psych: Denies depression, anxiety, hallucinations ? ?   ?Objective:  ?  ?Blood pressure (!) 149/97, pulse 88, temperature 99.4 ?F (37.4 ?C), temperature source Oral, weight 195 lb 9.6 oz (88.7 kg), SpO2 98 %. ? ?Gen. Pleasant, well-nourished, in no distress, normal affect   ?HEENT: /AT, face symmetric, conjunctiva clear, no scleral icterus, PERRLA,  EOMI, nares patent without drainage ?Lungs: no accessory muscle use, CTAB, no wheezes or rales ?Cardiovascular: RRR, no m/r/g, no peripheral edema ?Musculoskeletal: Edema of R knee.  No deformities, no cyanosis or clubbing, normal tone ?Neuro:  A&Ox3, CN II-XII intact, normal gait ?Skin:  Warm, no lesions/ rash.  Healing surgical incision midline R knee. ? ? ?Wt Readings from Last 3 Encounters:  ?08/18/21 198 lb 12.8 oz (90.2 kg)  ?08/07/21 198 lb 12.8 oz (90.2 kg)  ?08/01/21 203 lb 2 oz (92.1 kg)  ? ? ?Lab Results  ?Component Value Date  ? WBC 12.4 (H) 08/21/2021  ? HGB 11.4 (L) 08/21/2021  ? HCT 35.0 (L) 08/21/2021  ? PLT 316 08/21/2021  ? GLUCOSE 121 (H) 08/20/2021  ? CHOL 228 (H) 11/27/2020  ? TRIG 148.0 11/27/2020  ? HDL 45.10 11/27/2020  ? LDLDIRECT 147.4 01/11/2013  ? LDLCALC 153 (H) 11/27/2020  ? ALT 23 08/07/2021  ? AST 22 08/07/2021  ? NA 139 08/20/2021  ? K 3.8 08/20/2021  ? CL 106 08/20/2021  ? CREATININE 0.64 08/20/2021  ? BUN 20 08/20/2021  ? CO2 25 08/20/2021  ? TSH 1.25 07/10/2021  ? INR 1.0 08/07/2021  ? HGBA1C 5.9 07/10/2021  ? ? ?Assessment/Plan: ? ?Muscle spasm  ?-spasms in RLE s/p R TKR 08/18/21 ?-robaxin prn ?-continue f/u with Ortho and PT ?- Plan: methocarbamol (ROBAXIN) 500 MG tablet ? ?S/P total knee  arthroplasty, right ?-healing. ?-done 11/15/35 ?-complicated by increased pain, muscle spasm, and burning sensation. ?-continue supportive care such as elevation and icing. ?-continue PT and f/u with Ortho ? ?Essential hypertension ?-elevated.  Likely 2/2 pain s/p R TKR as previously controlled. ?-continue current med lisinopril 30 mg daily ?-continue lifestyle modifications and pain control. ?-encouraged to monitor bp at home ? ?F/u in 1 month. ? ?Grier Mitts, MD ?

## 2021-09-28 ENCOUNTER — Encounter: Payer: Self-pay | Admitting: Family Medicine

## 2021-10-06 ENCOUNTER — Ambulatory Visit (INDEPENDENT_AMBULATORY_CARE_PROVIDER_SITE_OTHER): Payer: 59

## 2021-10-06 VITALS — Ht 63.0 in | Wt 180.0 lb

## 2021-10-06 DIAGNOSIS — Z0000 Encounter for general adult medical examination without abnormal findings: Secondary | ICD-10-CM | POA: Diagnosis not present

## 2021-10-06 NOTE — Progress Notes (Signed)
?I connected with Talitha Givens today by telephone and verified that I am speaking with the correct person using two identifiers. ?Location patient: home ?Location provider: work ?Persons participating in the virtual visit: Arleny, Kruger LPN. ?  ?I discussed the limitations, risks, security and privacy concerns of performing an evaluation and management service by telephone and the availability of in person appointments. I also discussed with the patient that there may be a patient responsible charge related to this service. The patient expressed understanding and verbally consented to this telephonic visit.  ?  ?Interactive audio and video telecommunications were attempted between this provider and patient, however failed, due to patient having technical difficulties OR patient did not have access to video capability.  We continued and completed visit with audio only. ? ?  ? ?Vital signs may be patient reported or missing. ? ?Subjective:  ? Rebecca [name] is a 66 y.o. female who presents for an Initial Medicare Annual Wellness Visit. ? ?Review of Systems    ? ?Cardiac Risk Factors include: advanced age (>47mn, >>39women);dyslipidemia;hypertension;obesity (BMI >30kg/m2) ? ?   ?Objective:  ?  ?Today's Vitals  ? 10/06/21 0912  ?Weight: 180 lb (81.6 kg)  ?Height: '5\' 3"'$  (1.6 m)  ?PainSc: 3   ? ?Body mass index is 31.89 kg/m?. ? ? ?  10/06/2021  ?  9:20 AM 08/18/2021  ?  4:00 PM 08/07/2021  ? 10:05 AM 04/19/2021  ?  1:40 AM 02/23/2017  ? 10:13 AM 10/20/2011  ?  6:00 PM 10/13/2011  ?  3:02 PM  ?Advanced Directives  ?Does Patient Have a Medical Advance Directive? No Yes No No Yes Patient does not have advance directive Patient does not have advance directive;Patient would not like information  ?Type of Advance Directive  Living will       ?Does patient want to make changes to medical advance directive?  No - Patient declined   No - Patient declined    ?Would patient like information on creating a medical advance  directive?  No - Patient declined Yes (MAU/Ambulatory/Procedural Areas - Information given) No - Patient declined     ? ? ?Current Medications (verified) ?Outpatient Encounter Medications as of 10/06/2021  ?Medication Sig  ? albuterol (PROVENTIL) (2.5 MG/3ML) 0.083% nebulizer solution Take 3 mLs (2.5 mg total) by nebulization every 4 (four) hours as needed for wheezing or shortness of breath.  ? albuterol (VENTOLIN HFA) 108 (90 Base) MCG/ACT inhaler Use 2 puffs every 4 hours as needed for cough or wheeze.  May use  2 puffs 10-20 minutes prior to exercise.  ? ALVESCO 160 MCG/ACT inhaler Inhale 1 puff into the lungs 2 (two) times daily.  ? cetirizine (ZYRTEC ALLERGY) 10 MG tablet Take 1 tablet (10 mg total) by mouth daily.  ? hydrochlorothiazide (HYDRODIURIL) 12.5 MG tablet TAKE 1 TABLET BY MOUTH EVERY DAY  ? lisinopril (ZESTRIL) 30 MG tablet Take 1 tablet (30 mg total) by mouth daily.  ? montelukast (SINGULAIR) 10 MG tablet TAKE 1 TABLET BY MOUTH EVERYDAY AT BEDTIME  ? Multiple Vitamins-Minerals (MULTIVITAMIN WITH MINERALS) tablet Take 1 tablet by mouth daily with breakfast.  ? RYALTRIS 665-25 MCG/ACT SUSP Place 2 sprays into both nostrils 2 (two) times daily.  ? Vitamin D, Ergocalciferol, (DRISDOL) 1.25 MG (50000 UNIT) CAPS capsule Take 1 capsule (50,000 Units total) by mouth every 7 (seven) days. (Patient taking differently: Take 50,000 Units by mouth every 7 (seven) days. Friday)  ? aspirin EC 325 MG EC tablet  Take 1 tablet (325 mg total) by mouth 2 (two) times daily. Then take one 81 mg aspirin once a day for three weeks. Then discontinue aspirin. (Patient not taking: Reported on 10/06/2021)  ? atorvastatin (LIPITOR) 20 MG tablet TAKE 1 TABLET BY MOUTH EVERY DAY (Patient not taking: Reported on 10/06/2021)  ? gabapentin (NEURONTIN) 300 MG capsule Take a 300 mg capsule three times a day for two weeks following surgery.Then take a 300 mg capsule two times a day for two weeks. Then take a 300 mg capsule once a day for  two weeks. Then discontinue. (Patient not taking: Reported on 10/06/2021)  ? HYDROmorphone (DILAUDID) 2 MG tablet Take 1-2 tablets (2-4 mg total) by mouth every 6 (six) hours as needed for severe pain. (Patient not taking: Reported on 10/06/2021)  ? meclizine (ANTIVERT) 25 MG tablet Take 1 tablet (25 mg total) by mouth 3 (three) times daily as needed for dizziness. (Patient not taking: Reported on 10/06/2021)  ? methocarbamol (ROBAXIN) 500 MG tablet Take 1 tablet (500 mg total) by mouth every 6 (six) hours as needed for muscle spasms. (Patient not taking: Reported on 10/06/2021)  ? traMADol (ULTRAM) 50 MG tablet Take 1-2 tablets (50-100 mg total) by mouth every 6 (six) hours as needed for moderate pain. (Patient not taking: Reported on 10/06/2021)  ? ?No facility-administered encounter medications on file as of 10/06/2021.  ? ? ?Allergies (verified) ?Hydrocodone and Sulfa antibiotics  ? ?History: ?Past Medical History:  ?Diagnosis Date  ? Arthritis   ? Asthma   ? Blindness, legal   ? L EYE  ? GERD (gastroesophageal reflux disease)   ? Hearing loss   ? History of kidney stones   ? Hyperlipidemia   ? Hypertension   ? Lapband May 2013 11/18/2011  ? Migraines   ? Obesity, Class III, BMI 40-49.9 (morbid obesity) ([place]) 06/03/2011  ? Pre-diabetes   ? Sleep apnea   ? uses c-pap  ? ?Past Surgical History:  ?Procedure Laterality Date  ? ABDOMINAL HYSTERECTOMY  2006  ? fibroids  ? BREATH TEK H PYLORI  08/17/2011  ? Procedure: BREATH TEK H PYLORI;  Surgeon: Pedro Earls, MD;  Location: [facility];  Service: General;  Laterality: N/A;  PATIENT WILL COME AT 0715  ? COLONOSCOPY  2008  ? @ [facility]   ? EYE SURGERY    ? Patient unsure of surgery date. Left eye  ? KNEE SURGERY  1992  ? right  ? LAPAROSCOPIC GASTRIC BANDING  10/20/2011  ? Procedure: LAPAROSCOPIC GASTRIC BANDING;  Surgeon: Pedro Earls, MD;  Location: [facility];  Service: General;  Laterality: N/A;  ? TOTAL KNEE ARTHROPLASTY Right 08/18/2021  ? Procedure: TOTAL KNEE  ARTHROPLASTY;  Surgeon: Gaynelle Arabian, MD;  Location: [facility];  Service: Orthopedics;  Laterality: Right;  ? ?Family History  ?Problem Relation Age of Onset  ? Cancer Mother   ?     breast  ? Breast cancer Mother   ? Alcohol abuse Father   ? Heart disease Father 70  ?     MI  ? Hypertension Father   ? Colon cancer Neg Hx   ? ?Social History  ? ?Socioeconomic History  ? Marital status: Married  ?  Spouse name: Not on file  ? Number of children: Not on file  ? Years of education: Not on file  ? Highest education level: Not on file  ?Occupational History  ? Not on file  ?Tobacco Use  ? Smoking status: Former  ?  Packs/day: 0.50  ?  Years: 15.00  ?  Pack years: 7.50  ?  Types: Cigarettes  ?  Quit date: 06/02/1994  ?  Years since quitting: 27.3  ? Smokeless tobacco: Never  ?Vaping Use  ? Vaping Use: Never used  ?Substance and Sexual Activity  ? Alcohol use: No  ? Drug use: No  ? Sexual activity: Not on file  ?Other Topics Concern  ? Not on file  ?Social History Narrative  ? 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   ? Home Situation: lives with husband  ?   ? Spiritual Beliefs: Christian  - Education administrator, Dentist  ?   ? Lifestyle: wt watchers 2019, starting to exercise  ?   ?   ? ?Social Determinants of Health  ? ?Financial Resource Strain: Low Risk   ? Difficulty of Paying Living Expenses: Not hard at all  ?Food Insecurity: No Food Insecurity  ? Worried About Charity fundraiser in the Last Year: Never true  ? Ran Out of Food in the Last Year: Never true  ?Transportation Needs: No Transportation Needs  ? Lack of Transportation (Medical): No  ? Lack of Transportation (Non-Medical): No  ?Physical Activity: Inactive  ? Days of Exercise per Week: 0 days  ? Minutes of Exercise per Session: 0 min  ?Stress: No Stress Concern Present  ? Feeling of Stress : Not at all  ?Social Connections: Not on file  ? ? ?Tobacco Counseling ?Counseling given: Not  Answered ? ? ?Clinical Intake: ? ?Pre-visit preparation completed: Yes ? ?Pain : 0-10 ?Pain Score: 3  ?Pain Type: Acute pain ?Pain Location: Knee ?Pain Orientation: Right ?Pain Descriptors / Indicators: Aching ?Pain O

## 2021-10-06 NOTE — Patient Instructions (Signed)
Rebecca [name] , ?Thank you for taking time to come for your Medicare Wellness Visit. I appreciate your ongoing commitment to your health goals. Please review the following plan we discussed and let me know if I can assist you in the future.  ? ?Screening recommendations/referrals: ?Colonoscopy: completed 08/06/2017, due 08/07/2027 ?Mammogram: due ?Bone Density: completed 06/24/2011 ?Recommended yearly ophthalmology/optometry visit for glaucoma screening and checkup ?Recommended yearly dental visit for hygiene and checkup ? ?Vaccinations: ?Influenza vaccine: due 01/13/2022 ?Pneumococcal vaccine: due ?Tdap vaccine: due ?Shingles vaccine: discussed   ?Covid-19: 03/04/2020, 07/20/2019, 06/29/2019 ? ?Advanced directives: Advance directive discussed with you today.  ? ?Conditions/risks identified: none ? ?Next appointment: Follow up in one year for your annual wellness visit  ? ? ?Preventive Care 68 Years and Older, Female ?Preventive care refers to lifestyle choices and visits with your health care provider that can promote health and wellness. ?What does preventive care include? ?A yearly physical exam. This is also called an annual well check. ?Dental exams once or twice a year. ?Routine eye exams. Ask your health care provider how often you should have your eyes checked. ?Personal lifestyle choices, including: ?Daily care of your teeth and gums. ?Regular physical activity. ?Eating a healthy diet. ?Avoiding tobacco and drug use. ?Limiting alcohol use. ?Practicing safe sex. ?Taking low-dose aspirin every day. ?Taking vitamin and mineral supplements as recommended by your health care provider. ?What happens during an annual well check? ?The services and screenings done by your health care provider during your annual well check will depend on your age, overall health, lifestyle risk factors, and family history of disease. ?Counseling  ?Your health care provider may ask you questions about your: ?Alcohol use. ?Tobacco use. ?Drug  use. ?Emotional well-being. ?Home and relationship well-being. ?Sexual activity. ?Eating habits. ?History of falls. ?Memory and ability to understand (cognition). ?Work and work Statistician. ?Reproductive health. ?Screening  ?You may have the following tests or measurements: ?Height, weight, and BMI. ?Blood pressure. ?Lipid and cholesterol levels. These may be checked every 5 years, or more frequently if you are over 51 years old. ?Skin check. ?Lung cancer screening. You may have this screening every year starting at age 63 if you have a 30-pack-year history of smoking and currently smoke or have quit within the past 15 years. ?Fecal occult blood test (FOBT) of the stool. You may have this test every year starting at age 7. ?Flexible sigmoidoscopy or colonoscopy. You may have a sigmoidoscopy every 5 years or a colonoscopy every 10 years starting at age 68. ?Hepatitis C blood test. ?Hepatitis B blood test. ?Sexually transmitted disease (STD) testing. ?Diabetes screening. This is done by checking your blood sugar (glucose) after you have not eaten for a while (fasting). You may have this done every 1-3 years. ?Bone density scan. This is done to screen for osteoporosis. You may have this done starting at age 43. ?Mammogram. This may be done every 1-2 years. Talk to your health care provider about how often you should have regular mammograms. ?Talk with your health care provider about your test results, treatment options, and if necessary, the need for more tests. ?Vaccines  ?Your health care provider may recommend certain vaccines, such as: ?Influenza vaccine. This is recommended every year. ?Tetanus, diphtheria, and acellular pertussis (Tdap, Td) vaccine. You may need a Td booster every 10 years. ?Zoster vaccine. You may need this after age 47. ?Pneumococcal 13-valent conjugate (PCV13) vaccine. One dose is recommended after age 74. ?Pneumococcal polysaccharide (PPSV23) vaccine. One dose is recommended after  age  9. ?Talk to your health care provider about which screenings and vaccines you need and how often you need them. ?This information is not intended to replace advice given to you by your health care provider. Make sure you discuss any questions you have with your health care provider. ?Document Released: 06/28/2015 Document Revised: 02/19/2016 Document Reviewed: 04/02/2015 ?Elsevier Interactive Patient Education ? 2017 [place]. ? ?Fall Prevention in the Home ?Falls can cause injuries. They can happen to people of all ages. There are many things you can do to make your home safe and to help prevent falls. ?What can I do on the outside of my home? ?Regularly fix the edges of walkways and driveways and fix any cracks. ?Remove anything that might make you trip as you walk through a door, such as a raised step or threshold. ?Trim any bushes or trees on the path to your home. ?Use bright outdoor lighting. ?Clear any walking paths of anything that might make someone trip, such as rocks or tools. ?Regularly check to see if handrails are loose or broken. Make sure that both sides of any steps have handrails. ?Any raised decks and porches should have guardrails on the edges. ?Have any leaves, snow, or ice cleared regularly. ?Use sand or salt on walking paths during winter. ?Clean up any spills in your garage right away. This includes oil or grease spills. ?What can I do in the bathroom? ?Use night lights. ?Install grab bars by the toilet and in the tub and shower. Do not use towel bars as grab bars. ?Use non-skid mats or decals in the tub or shower. ?If you need to sit down in the shower, use a plastic, non-slip stool. ?Keep the floor dry. Clean up any water that spills on the floor as soon as it happens. ?Remove soap buildup in the tub or shower regularly. ?Attach bath mats securely with double-sided non-slip rug tape. ?Do not have throw rugs and other things on the floor that can make you trip. ?What can I do in the  bedroom? ?Use night lights. ?Make sure that you have a light by your bed that is easy to reach. ?Do not use any sheets or blankets that are too big for your bed. They should not hang down onto the floor. ?Have a firm chair that has side arms. You can use this for support while you get dressed. ?Do not have throw rugs and other things on the floor that can make you trip. ?What can I do in the kitchen? ?Clean up any spills right away. ?Avoid walking on wet floors. ?Keep items that you use a lot in easy-to-reach places. ?If you need to reach something above you, use a strong step stool that has a grab bar. ?Keep electrical cords out of the way. ?Do not use floor polish or wax that makes floors slippery. If you must use wax, use non-skid floor wax. ?Do not have throw rugs and other things on the floor that can make you trip. ?What can I do with my stairs? ?Do not leave any items on the stairs. ?Make sure that there are handrails on both sides of the stairs and use them. Fix handrails that are broken or loose. Make sure that handrails are as long as the stairways. ?Check any carpeting to make sure that it is firmly attached to the stairs. Fix any carpet that is loose or worn. ?Avoid having throw rugs at the top or bottom of the stairs. If you do  have throw rugs, attach them to the floor with carpet tape. ?Make sure that you have a light switch at the top of the stairs and the bottom of the stairs. If you do not have them, ask someone to add them for you. ?What else can I do to help prevent falls? ?Wear shoes that: ?Do not have high heels. ?Have rubber bottoms. ?Are comfortable and fit you well. ?Are closed at the toe. Do not wear sandals. ?If you use a stepladder: ?Make sure that it is fully opened. Do not climb a closed stepladder. ?Make sure that both sides of the stepladder are locked into place. ?Ask someone to hold it for you, if possible. ?Clearly mark and make sure that you can see: ?Any grab bars or  handrails. ?First and last steps. ?Where the edge of each step is. ?Use tools that help you move around (mobility aids) if they are needed. These include: ?Canes. ?Walkers. ?Scooters. ?Crutches. ?Turn on the lights when you go

## 2021-10-09 ENCOUNTER — Other Ambulatory Visit: Payer: Self-pay | Admitting: Family Medicine

## 2021-10-09 DIAGNOSIS — E559 Vitamin D deficiency, unspecified: Secondary | ICD-10-CM

## 2021-12-04 ENCOUNTER — Other Ambulatory Visit: Payer: Self-pay

## 2021-12-04 DIAGNOSIS — J310 Chronic rhinitis: Secondary | ICD-10-CM

## 2021-12-04 MED ORDER — RYALTRIS 665-25 MCG/ACT NA SUSP: 2.0000 | Freq: Two times a day (BID) | NASAL | 2 refills | Status: DC

## 2021-12-09 ENCOUNTER — Other Ambulatory Visit: Payer: Self-pay

## 2021-12-09 MED ORDER — RYALTRIS 665-25 MCG/ACT NA SUSP: 2.0000 | Freq: Every day | NASAL | 5 refills | Status: DC | PRN

## 2021-12-11 ENCOUNTER — Other Ambulatory Visit: Payer: Self-pay

## 2021-12-18 ENCOUNTER — Telehealth: Payer: Self-pay

## 2021-12-18 NOTE — Telephone Encounter (Signed)
PA sent on Ryaltris 665-71mg/ACT waiting on an approval or denial

## 2021-12-18 NOTE — Telephone Encounter (Signed)
error 

## 2021-12-25 NOTE — Telephone Encounter (Signed)
PA Approved  KEY: [id number]   - Approval 12/18/21  - 12/19/2022.

## 2022-02-04 ENCOUNTER — Emergency Department (HOSPITAL_COMMUNITY): Payer: 59

## 2022-02-04 ENCOUNTER — Ambulatory Visit: Payer: 59 | Admitting: Allergy

## 2022-02-04 ENCOUNTER — Other Ambulatory Visit: Payer: Self-pay

## 2022-02-04 ENCOUNTER — Emergency Department (HOSPITAL_COMMUNITY)
Admission: EM | Admit: 2022-02-04 | Discharge: 2022-02-04 | Disposition: A | Discharge status: Home / Self Care | Payer: 59 | Attending: Emergency Medicine | Admitting: Emergency Medicine

## 2022-02-04 ENCOUNTER — Encounter (HOSPITAL_COMMUNITY): Payer: Self-pay

## 2022-02-04 DIAGNOSIS — J45909 Unspecified asthma, uncomplicated: Secondary | ICD-10-CM | POA: Insufficient documentation

## 2022-02-04 DIAGNOSIS — R059 Cough, unspecified: Secondary | ICD-10-CM | POA: Diagnosis present

## 2022-02-04 DIAGNOSIS — U071 COVID-19: Secondary | ICD-10-CM | POA: Insufficient documentation

## 2022-02-04 DIAGNOSIS — Z79899 Other long term (current) drug therapy: Secondary | ICD-10-CM | POA: Diagnosis not present

## 2022-02-04 DIAGNOSIS — I10 Essential (primary) hypertension: Secondary | ICD-10-CM | POA: Insufficient documentation

## 2022-02-04 DIAGNOSIS — Z7982 Long term (current) use of aspirin: Secondary | ICD-10-CM | POA: Diagnosis not present

## 2022-02-04 LAB — CBC
HCT: 35.4 % — ABNORMAL LOW (ref 36.0–46.0)
Hemoglobin: 11.9 g/dL — ABNORMAL LOW (ref 12.0–15.0)
MCH: 31.1 pg (ref 26.0–34.0)
MCHC: 33.6 g/dL (ref 30.0–36.0)
MCV: 92.4 fL (ref 80.0–100.0)
Platelets: 387 10*3/uL (ref 150–400)
RBC: 3.83 MIL/uL — ABNORMAL LOW (ref 3.87–5.11)
RDW: 13.9 % (ref 11.5–15.5)
WBC: 6.6 10*3/uL (ref 4.0–10.5)
nRBC: 0 % (ref 0.0–0.2)

## 2022-02-04 LAB — BASIC METABOLIC PANEL
Anion gap: 9 (ref 5–15)
BUN: 19 mg/dL (ref 8–23)
CO2: 26 mmol/L (ref 22–32)
Calcium: 9.5 mg/dL (ref 8.9–10.3)
Chloride: 109 mmol/L (ref 98–111)
Creatinine, Ser: 0.9 mg/dL (ref 0.44–1.00)
GFR, Estimated: >60 mL/min (ref >60)
Glucose, Bld: 94 mg/dL (ref 70–99)
Potassium: 3.1 mmol/L — ABNORMAL LOW (ref 3.5–5.1)
Sodium: 144 mmol/L (ref 135–145)

## 2022-02-04 LAB — TROPONIN I (HIGH SENSITIVITY): Troponin I (High Sensitivity): 5 ng/L (ref <18)

## 2022-02-04 MED ORDER — ACETAMINOPHEN 325 MG PO TABS
650.0000 mg | ORAL_TABLET | Freq: Once | ORAL | Status: AC
  Administered 2022-02-04: 650 mg via ORAL
  Filled 2022-02-04: qty 2

## 2022-02-04 MED ORDER — MOLNUPIRAVIR EUA 200MG CAPSULE: 4.0000 | ORAL_CAPSULE | Freq: Two times a day (BID) | ORAL | 0 refills | Status: AC

## 2022-02-04 MED ORDER — BENZONATATE 100 MG PO CAPS: 100.0000 mg | ORAL_CAPSULE | Freq: Three times a day (TID) | ORAL | 0 refills | Status: DC

## 2022-02-04 MED ORDER — BENZONATATE 100 MG PO CAPS
200.0000 mg | ORAL_CAPSULE | Freq: Once | ORAL | Status: AC
  Administered 2022-02-04: 200 mg via ORAL
  Filled 2022-02-04: qty 2

## 2022-02-04 NOTE — ED Notes (Signed)
Discharge instructions reviewed. Verbalized understanding. Pharmacy reviewed. Denies further questions.

## 2022-02-04 NOTE — ED Provider Triage Note (Signed)
Emergency Medicine Provider Triage Evaluation Note  Rebecca [name] , a 66 y.o. female  was evaluated in triage.  Pt complains of cough, chest pain, shortness of breath over the past 2 days.  She took 2 home COVID test which were negative, but reports a positive test today at [facility].  She has a history of asthma.  She has had COVID vaccines, no previous COVID infections.  She was just on a cruise with family members with similar symptoms.  She has albuterol inhaler at home.  Review of Systems  Positive: Cough, shortness of breath Negative: Fever  Physical Exam  BP (!) 124/93 (BP Location: Left Arm)   Pulse 95   Temp 99 F (37.2 C) (Oral)   Resp 18   SpO2 95%  Gen:   Awake, no distress   Resp:  Normal effort  MSK:   Moves extremities without difficulty  Other:  Lungs with minimal wheezing, frequent coughing during exam.  Medical Decision Making  Medically screening exam initiated at 2:40 PM.  Appropriate orders placed.  [name] was informed that the remainder of the evaluation will be completed by another provider, this initial triage assessment does not replace that evaluation, and the importance of remaining in the ED until their evaluation is complete.     Carlisle Cater, PA-C 02/04/22 1441

## 2022-02-04 NOTE — ED Triage Notes (Signed)
Pt reports SHOb, chest pain, cough, and headache that began yesterday. Pt reports testing positive for COVID yesterday ay [facility].

## 2022-02-04 NOTE — ED Provider Notes (Signed)
[name] Provider Note   CSN: [id number] Arrival date & time: 02/04/22  1404     History  Chief Complaint  Patient presents with   Shortness of Breath   Chest Pain   Covid Positive    DENAI CABA is a 66 y.o. female.   Shortness of Breath Associated symptoms: chest pain   Chest Pain Associated symptoms: shortness of breath      Patient has history of obstructive sleep apnea, hypertension, hyperlipidemia, elevated BMI, asthma who presents today with complaints of cough and congestion.  Patient states some family members recently traveled and were diagnosed with COVID.  She had been exposed to them.  Patient states yesterday she started having cough congestion headache.  She started to feel somewhat short of breath.  She went to [facility] yesterday and was tested and was notified today that was positive.  Home Medications Prior to Admission medications   Medication Sig Start Date End Date Taking? Authorizing Provider  benzonatate (TESSALON) 100 MG capsule Take 1 capsule (100 mg total) by mouth every 8 (eight) hours. 02/04/22  Yes Dorie Rank, MD  molnupiravir EUA (LAGEVRIO) 200 mg CAPS capsule Take 4 capsules (800 mg total) by mouth 2 (two) times daily for 5 days. 02/04/22 02/09/22 Yes Dorie Rank, MD  albuterol (PROVENTIL) (2.5 MG/3ML) 0.083% nebulizer solution Take 3 mLs (2.5 mg total) by nebulization every 4 (four) hours as needed for wheezing or shortness of breath. 11/15/19   Kennith Gain, MD  albuterol (VENTOLIN HFA) 108 (90 Base) MCG/ACT inhaler Use 2 puffs every 4 hours as needed for cough or wheeze.  May use  2 puffs 10-20 minutes prior to exercise. 08/01/21   Kennith Gain, MD  ALVESCO 160 MCG/ACT inhaler Inhale 1 puff into the lungs 2 (two) times daily. 08/01/21   Kennith Gain, MD  aspirin EC 325 MG EC tablet Take 1 tablet (325 mg total) by mouth 2 (two) times daily. Then take one 81 mg aspirin once a day  for three weeks. Then discontinue aspirin. Patient not taking: Reported on 10/06/2021 08/19/21   Fenton Foy D, PA-C  atorvastatin (LIPITOR) 20 MG tablet TAKE 1 TABLET BY MOUTH EVERY DAY Patient not taking: Reported on 10/06/2021 04/29/19   Billie Ruddy, MD  cetirizine (ZYRTEC ALLERGY) 10 MG tablet Take 1 tablet (10 mg total) by mouth daily. 08/01/21   Kennith Gain, MD  gabapentin (NEURONTIN) 300 MG capsule Take a 300 mg capsule three times a day for two weeks following surgery.Then take a 300 mg capsule two times a day for two weeks. Then take a 300 mg capsule once a day for two weeks. Then discontinue. Patient not taking: Reported on 10/06/2021 08/19/21   Fenton Foy D, PA-C  hydrochlorothiazide (HYDRODIURIL) 12.5 MG tablet TAKE 1 TABLET BY MOUTH EVERY DAY 09/08/21   Billie Ruddy, MD  HYDROmorphone (DILAUDID) 2 MG tablet Take 1-2 tablets (2-4 mg total) by mouth every 6 (six) hours as needed for severe pain. Patient not taking: Reported on 10/06/2021 08/20/21   Fenton Foy D, PA-C  lisinopril (ZESTRIL) 30 MG tablet Take 1 tablet (30 mg total) by mouth daily. 09/08/21   Billie Ruddy, MD  meclizine (ANTIVERT) 25 MG tablet Take 1 tablet (25 mg total) by mouth 3 (three) times daily as needed for dizziness. Patient not taking: Reported on 10/06/2021 04/19/21   Deno Etienne, DO  methocarbamol (ROBAXIN) 500 MG tablet Take 1 tablet (500 mg total)  by mouth every 6 (six) hours as needed for muscle spasms. Patient not taking: Reported on 10/06/2021 09/18/21   Billie Ruddy, MD  montelukast (SINGULAIR) 10 MG tablet TAKE 1 TABLET BY MOUTH EVERYDAY AT BEDTIME 08/01/21   Kennith Gain, MD  Multiple Vitamins-Minerals (MULTIVITAMIN WITH MINERALS) tablet Take 1 tablet by mouth daily with breakfast.    [provider]  Olopatadine-Mometasone (RYALTRIS) [phone] MCG/ACT SUSP Place 2 sprays into the nose daily as needed. 12/09/21   Kennith Gain, MD  RYALTRIS [phone]  MCG/ACT SUSP Place 2 sprays into both nostrils 2 (two) times daily. 12/04/21   Kennith Gain, MD  traMADol (ULTRAM) 50 MG tablet Take 1-2 tablets (50-100 mg total) by mouth every 6 (six) hours as needed for moderate pain. Patient not taking: Reported on 10/06/2021 08/19/21   Fenton Foy D, PA-C  Vitamin D, Ergocalciferol, (DRISDOL) 1.25 MG (50000 UNIT) CAPS capsule Take 1 capsule (50,000 Units total) by mouth every 7 (seven) days. Patient taking differently: Take 50,000 Units by mouth every 7 (seven) days. Friday 07/11/21   Billie Ruddy, MD      Allergies    Hydrocodone and Sulfa antibiotics    Review of Systems   Review of Systems  Respiratory:  Positive for shortness of breath.   Cardiovascular:  Positive for chest pain.    Physical Exam Updated Vital Signs BP (!) 162/118   Pulse 90   Temp 100 F (37.8 C) (Oral)   Resp 18   SpO2 99%  Physical Exam Vitals and nursing note reviewed.  Constitutional:      Appearance: She is well-developed. She is not diaphoretic.  HENT:     Head: Normocephalic and atraumatic.     Right Ear: External ear normal.     Left Ear: External ear normal.  Eyes:     General: No scleral icterus.       Right eye: No discharge.        Left eye: No discharge.     Conjunctiva/sclera: Conjunctivae normal.  Neck:     Trachea: No tracheal deviation.  Cardiovascular:     Rate and Rhythm: Normal rate and regular rhythm.  Pulmonary:     Effort: Pulmonary effort is normal. No respiratory distress.     Breath sounds: Normal breath sounds. No stridor. No wheezing or rales.  Abdominal:     General: Bowel sounds are normal. There is no distension.     Palpations: Abdomen is soft.     Tenderness: There is no abdominal tenderness. There is no guarding or rebound.  Musculoskeletal:        General: No tenderness or deformity.     Cervical back: Neck supple.  Skin:    General: Skin is warm and dry.     Findings: No rash.  Neurological:      General: No focal deficit present.     Mental Status: She is alert.     Cranial Nerves: No cranial nerve deficit (no facial droop, extraocular movements intact, no slurred speech).     Sensory: No sensory deficit.     Motor: No abnormal muscle tone or seizure activity.     Coordination: Coordination normal.  Psychiatric:        Mood and Affect: Mood normal.     ED Results / Procedures / Treatments   Labs (all labs ordered are listed, but only abnormal results are displayed) Labs Reviewed  BASIC METABOLIC PANEL - Abnormal; Notable for the following components:  Result Value   Potassium 3.1 (*)    All other components within normal limits  CBC - Abnormal; Notable for the following components:   RBC 3.83 (*)    Hemoglobin 11.9 (*)    HCT 35.4 (*)    All other components within normal limits  TROPONIN I (HIGH SENSITIVITY)    EKG EKG Interpretation  Date/Time:  Wednesday February 04 2022 14:25:31 EDT Ventricular Rate:  96 PR Interval:  155 QRS Duration: 83 QT Interval:  349 QTC Calculation: 441 R Axis:   43 Text Interpretation: Sinus rhythm Ventricular premature complex Low voltage, precordial leads Borderline repolarization abnormality , new since last tracing Artifact Confirmed by Dorie Rank [phone]) on 02/04/2022 7:34:14 PM  Radiology DG Chest 2 View  Result Date: 02/04/2022 CLINICAL DATA:  chest pain and shortness of breath EXAM: CHEST - 2 VIEW COMPARISON:  June 24, 2011 FINDINGS: The heart size and mediastinal contours are within normal limits. Both lungs are clear. Moderate thoracic spondylosis, stable. Moderate degenerative changes at the shoulder joints and have progressed in the interim. IMPRESSION: No active cardiopulmonary disease. Electronically Signed   By: Frazier Richards M.D.   On: 02/04/2022 15:01    Procedures Procedures    Medications Ordered in ED Medications  benzonatate (TESSALON) capsule 200 mg (200 mg Oral Given 02/04/22 1955)  acetaminophen  (TYLENOL) tablet 650 mg (650 mg Oral Given 02/04/22 1955)    ED Course/ Medical Decision Making/ A&P Clinical Course as of 02/04/22 1956  Wed Feb 04, 2022  1933 DG Chest 2 View Chest x-ray without acute findings [JK]    Clinical Course User Index [JK] Dorie Rank, MD                           Medical Decision Making Amount and/or Complexity of Data Reviewed Labs: ordered. Decision-making details documented in ED Course. Radiology: ordered and independent interpretation performed. Decision-making details documented in ED Course.  Risk OTC drugs. Prescription drug management.   Patient presented to the ED for evaluation of chest pain cough congestion recent COVID exposure.  Patient was notified she was COVID-positive prior to coming to the ED.  Fortunately she does not have an oxygen requirement.  She is not in any respiratory distress.  Her x-ray does not show pneumonia.  No findings of acute cardiac ischemia or failure.  Patient appears appropriate for outpatient management of her covid.  Will discharge with prescription for Tessalon.  I will give her prescription for molnupiravir.  Evaluation and diagnostic testing in the emergency department does not suggest an emergent condition requiring admission or immediate intervention beyond what has been performed at this time.  The patient is safe for discharge and has been instructed to return immediately for worsening symptoms, change in symptoms or any other concerns.         Final Clinical Impression(s) / ED Diagnoses Final diagnoses:  [id number] virus infection  Hypertension, unspecified type    Rx / DC Orders ED Discharge Orders          Ordered    benzonatate (TESSALON) 100 MG capsule  Every 8 hours        02/04/22 1955    molnupiravir EUA (LAGEVRIO) 200 mg CAPS capsule  2 times daily        02/04/22 Kreg Shropshire, MD 02/04/22 1956

## 2022-02-04 NOTE — Discharge Instructions (Addendum)
Take the medications as prescribed.  Remain isolated for 5 days from the symptom onset.  There is recommended to then wear a mask when you are outside for the next following 5 days.  Return to the ER for worsening symptoms.  Your blood pressure was also somewhat elevated today.  Continue her medications and follow-up with your primary care doctor to have that rechecked.

## 2022-02-07 ENCOUNTER — Other Ambulatory Visit: Payer: Self-pay | Admitting: Allergy

## 2022-02-07 DIAGNOSIS — J310 Chronic rhinitis: Secondary | ICD-10-CM

## 2022-02-22 ENCOUNTER — Other Ambulatory Visit: Payer: Self-pay | Admitting: Allergy

## 2022-02-22 DIAGNOSIS — J310 Chronic rhinitis: Secondary | ICD-10-CM

## 2022-03-05 ENCOUNTER — Other Ambulatory Visit: Payer: Self-pay | Admitting: Obstetrics and Gynecology

## 2022-03-05 DIAGNOSIS — R928 Other abnormal and inconclusive findings on diagnostic imaging of breast: Secondary | ICD-10-CM

## 2022-03-17 ENCOUNTER — Ambulatory Visit
Admission: RE | Admit: 2022-03-17 | Discharge: 2022-03-17 | Disposition: A | Discharge status: Home / Self Care | Payer: 59 | Source: Ambulatory Visit | Attending: Obstetrics and Gynecology | Admitting: Obstetrics and Gynecology

## 2022-03-17 ENCOUNTER — Other Ambulatory Visit: Payer: Self-pay | Admitting: Obstetrics and Gynecology

## 2022-03-17 DIAGNOSIS — R928 Other abnormal and inconclusive findings on diagnostic imaging of breast: Secondary | ICD-10-CM

## 2022-03-17 DIAGNOSIS — N6320 Unspecified lump in the left breast, unspecified quadrant: Secondary | ICD-10-CM

## 2022-03-24 ENCOUNTER — Ambulatory Visit
Admission: RE | Admit: 2022-03-24 | Discharge: 2022-03-24 | Disposition: A | Discharge status: Home / Self Care | Payer: 59 | Source: Ambulatory Visit | Attending: Obstetrics and Gynecology | Admitting: Obstetrics and Gynecology

## 2022-03-24 ENCOUNTER — Other Ambulatory Visit: Payer: Self-pay | Admitting: Obstetrics and Gynecology

## 2022-03-24 DIAGNOSIS — N6320 Unspecified lump in the left breast, unspecified quadrant: Secondary | ICD-10-CM

## 2022-03-26 ENCOUNTER — Telehealth: Payer: Self-pay | Admitting: Hematology and Oncology

## 2022-03-26 DIAGNOSIS — D0510 Intraductal carcinoma in situ of unspecified breast: Secondary | ICD-10-CM | POA: Insufficient documentation

## 2022-03-26 NOTE — Telephone Encounter (Signed)
Spoke to patient to confirm morning clinic for 10/18, packet emailed to patient

## 2022-03-27 ENCOUNTER — Encounter: Payer: Self-pay | Admitting: *Deleted

## 2022-03-27 DIAGNOSIS — D0512 Intraductal carcinoma in situ of left breast: Secondary | ICD-10-CM

## 2022-03-31 ENCOUNTER — Ambulatory Visit: Payer: Self-pay | Admitting: Surgery

## 2022-03-31 DIAGNOSIS — D0512 Intraductal carcinoma in situ of left breast: Secondary | ICD-10-CM

## 2022-03-31 NOTE — Progress Notes (Signed)
Radiation Oncology         [phone] ________________________________  Multidisciplinary Breast Oncology Clinic [facility]) Initial Outpatient Consultation  Name: Rebecca [name] MRN: [id number]  Date: 04/01/2022  DOB: [birth year]  VZ:SMOLM, Langley Adie, MD  Donnie Mesa, MD   REFERRING PHYSICIAN: Donnie Mesa, MD  DIAGNOSIS: There were no encounter diagnoses.  Stage 0 (cTis (DCIS), cN0, cM0) Left Breast, Intermediate to high-grade DCIS, ER+ / PR+ / Her2 not assessed  No diagnosis found.  HISTORY OF PRESENT ILLNESS::Rebecca [name] is a 66 y.o. female who is presenting to the office today for evaluation of her newly diagnosed breast cancer. She is accompanied by ***. She is doing well overall.   She had routine screening mammography on 03/03/22 showing a possible abnormality in the left breast. She underwent unilateral left breast diagnostic mammography with tomography and left breast ultrasonography at [facility] on 03/17/22 showing: a new solid mass in the 11:30 o'clock left breast, located 2 cmfn. No axillary adenopathy was appreciated.    Biopsy of the 11:30 o'clock left breast on the date of 03/24/22 showed: intermediate to high grade DCIS with focal necrosis measuring 5 mmm in the greatest linear extent. Prognostic indicators significant for: estrogen receptor, 95% positive and progesterone receptor, 40% positive, both with strong staining intensity. HER2 not assessed.   Menarche: *** years old Age at first live birth: *** years old GP: *** LMP: *** Contraceptive: *** HRT: ***   The patient was referred today for presentation in the multidisciplinary conference.  Radiology studies and pathology slides were presented there for review and discussion of treatment options.  A consensus was discussed regarding potential next steps.  PREVIOUS RADIATION THERAPY: {EXAM; YES/NO:19492::"No"}  PAST MEDICAL HISTORY:  Past Medical History:  Diagnosis Date   Arthritis     Asthma    Blindness, legal    L EYE   GERD (gastroesophageal reflux disease)    Hearing loss    History of kidney stones    Hyperlipidemia    Hypertension    Lapband May 2013 11/18/2011   Migraines    Obesity, Class III, BMI 40-49.9 (morbid obesity) ([place]) 06/03/2011   Pre-diabetes    Sleep apnea    uses c-pap    PAST SURGICAL HISTORY: Past Surgical History:  Procedure Laterality Date   ABDOMINAL HYSTERECTOMY  2006   fibroids   BREATH TEK H PYLORI  08/17/2011   Procedure: BREATH TEK H PYLORI;  Surgeon: Pedro Earls, MD;  Location: [facility];  Service: General;  Laterality: N/A;  PATIENT WILL COME AT 0715   COLONOSCOPY  2008   @ [place]     Patient unsure of surgery date. Left eye   KNEE SURGERY  1992   right   LAPAROSCOPIC GASTRIC BANDING  10/20/2011   Procedure: LAPAROSCOPIC GASTRIC BANDING;  Surgeon: Pedro Earls, MD;  Location: [facility];  Service: General;  Laterality: N/A;   TOTAL KNEE ARTHROPLASTY Right 08/18/2021   Procedure: TOTAL KNEE ARTHROPLASTY;  Surgeon: Gaynelle Arabian, MD;  Location: [facility];  Service: Orthopedics;  Laterality: Right;    FAMILY HISTORY:  Family History  Problem Relation Age of Onset   Cancer Mother        breast   Breast cancer Mother 8   Alcohol abuse Father    Heart disease Father 83       MI   Hypertension Father    Breast cancer Daughter 73   Colon cancer Neg  Hx     SOCIAL HISTORY:  Social History   Socioeconomic History   Marital status: Married    Spouse name: Not on file   Number of children: Not on file   Years of education: Not on file   Highest education level: Not on file  Occupational History   Not on file  Tobacco Use   Smoking status: Former    Packs/day: 0.50    Years: 15.00    Total pack years: 7.50    Types: Cigarettes    Quit date: 06/02/1994    Years since quitting: 27.8   Smokeless tobacco: Never  Vaping Use   Vaping Use: Never used  Substance and Sexual Activity   Alcohol use: No    Drug use: No   Sexual activity: Not on file  Other Topics Concern   Not on file  Social History Narrative   Work or School: works part time with Physicist, 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       Home Situation: lives with husband      Spiritual Beliefs: Christian  - Education administrator, Dentist      Lifestyle: wt watchers 2019, starting to exercise         Social Determinants of Health   Financial Resource Strain: [place]  (10/06/2021)   Overall Financial Resource Strain (CARDIA)    Difficulty of Paying Living Expenses: Not hard at all  Food Insecurity: No [place] (10/06/2021)   Hunger Vital Sign    Worried About Running Out of Food in the Last Year: Never true    [place] in the Last Year: Never true  Transportation Needs: No Transportation Needs (10/06/2021)   PRAPARE - Hydrologist (Medical): No    Lack of Transportation (Non-Medical): No  Physical Activity: Inactive (10/06/2021)   Exercise Vital Sign    Days of Exercise per Week: 0 days    Minutes of Exercise per Session: 0 min  Stress: No Stress Concern Present (10/06/2021)   [place]    Feeling of Stress : Not at all  Social Connections: Not on file    ALLERGIES:  Allergies  Allergen Reactions   Hydrocodone Hives, Shortness Of Breath and Itching   Sulfa Antibiotics Hives    MEDICATIONS:  Current Outpatient Medications  Medication Sig Dispense Refill   albuterol (PROVENTIL) (2.5 MG/3ML) 0.083% nebulizer solution Take 3 mLs (2.5 mg total) by nebulization every 4 (four) hours as needed for wheezing or shortness of breath. 75 mL 1   albuterol (VENTOLIN HFA) 108 (90 Base) MCG/ACT inhaler Use 2 puffs every 4 hours as needed for cough or wheeze.  May use  2 puffs 10-20 minutes prior to exercise. 18 g 1   ALVESCO 160 MCG/ACT inhaler Inhale 1 puff into the lungs 2 (two) times daily. 1  each 2   aspirin EC 325 MG EC tablet Take 1 tablet (325 mg total) by mouth 2 (two) times daily. Then take one 81 mg aspirin once a day for three weeks. Then discontinue aspirin. (Patient not taking: Reported on 10/06/2021) 42 tablet 0   atorvastatin (LIPITOR) 20 MG tablet TAKE 1 TABLET BY MOUTH EVERY DAY (Patient not taking: Reported on 10/06/2021) 90 tablet 0   benzonatate (TESSALON) 100 MG capsule Take 1 capsule (100 mg total) by mouth every 8 (eight) hours. 21 capsule 0   cetirizine (ZYRTEC ALLERGY) 10  MG tablet Take 1 tablet (10 mg total) by mouth daily. 30 tablet 5   gabapentin (NEURONTIN) 300 MG capsule Take a 300 mg capsule three times a day for two weeks following surgery.Then take a 300 mg capsule two times a day for two weeks. Then take a 300 mg capsule once a day for two weeks. Then discontinue. (Patient not taking: Reported on 10/06/2021) 84 capsule 0   hydrochlorothiazide (HYDRODIURIL) 12.5 MG tablet TAKE 1 TABLET BY MOUTH EVERY DAY 90 tablet 1   HYDROmorphone (DILAUDID) 2 MG tablet Take 1-2 tablets (2-4 mg total) by mouth every 6 (six) hours as needed for severe pain. (Patient not taking: Reported on 10/06/2021) 30 tablet 0   lisinopril (ZESTRIL) 30 MG tablet Take 1 tablet (30 mg total) by mouth daily. 90 tablet 0   meclizine (ANTIVERT) 25 MG tablet Take 1 tablet (25 mg total) by mouth 3 (three) times daily as needed for dizziness. (Patient not taking: Reported on 10/06/2021) 30 tablet 0   methocarbamol (ROBAXIN) 500 MG tablet Take 1 tablet (500 mg total) by mouth every 6 (six) hours as needed for muscle spasms. (Patient not taking: Reported on 10/06/2021) 40 tablet 0   montelukast (SINGULAIR) 10 MG tablet TAKE 1 TABLET BY MOUTH EVERYDAY AT BEDTIME 30 tablet 0   Multiple Vitamins-Minerals (MULTIVITAMIN WITH MINERALS) tablet Take 1 tablet by mouth daily with breakfast.     Olopatadine-Mometasone (RYALTRIS) 665-25 MCG/ACT SUSP Place 2 sprays into the nose daily as needed. 29 g 5   RYALTRIS 665-25  MCG/ACT SUSP Place 2 sprays into both nostrils 2 (two) times daily. 29 g 2   traMADol (ULTRAM) 50 MG tablet Take 1-2 tablets (50-100 mg total) by mouth every 6 (six) hours as needed for moderate pain. (Patient not taking: Reported on 10/06/2021) 40 tablet 0   Vitamin D, Ergocalciferol, (DRISDOL) 1.25 MG (50000 UNIT) CAPS capsule Take 1 capsule (50,000 Units total) by mouth every 7 (seven) days. (Patient taking differently: Take 50,000 Units by mouth every 7 (seven) days. Friday) 12 capsule 0   No current facility-administered medications for this encounter.    REVIEW OF SYSTEMS: A 10+ POINT REVIEW OF SYSTEMS WAS OBTAINED including neurology, dermatology, psychiatry, cardiac, respiratory, lymph, extremities, GI, GU, musculoskeletal, constitutional, reproductive, HEENT. On the provided form, she reports ***. She denies *** and any other symptoms.    PHYSICAL EXAM:  vitals were not taken for this visit.  {may need to copy over vitals} Lungs are clear to auscultation bilaterally. Heart has regular rate and rhythm. No palpable cervical, supraclavicular, or axillary adenopathy. Abdomen soft, non-tender, normal bowel sounds. Breast: *** breast with no palpable mass, nipple discharge, or bleeding. *** breast with ***.   KPS = ***  100 - Normal; no complaints; no evidence of disease. 90   - Able to carry on normal activity; minor signs or symptoms of disease. 80   - Normal activity with effort; some signs or symptoms of disease. 62   - Cares for self; unable to carry on normal activity or to do active work. 60   - Requires occasional assistance, but is able to care for most of his personal needs. 50   - Requires considerable assistance and frequent medical care. 71   - Disabled; requires special care and assistance. 51   - Severely disabled; hospital admission is indicated although death not imminent. 54   - Very sick; hospital admission necessary; active supportive treatment necessary. 10   -  Moribund; fatal processes  progressing rapidly. 0     - Dead  Karnofsky DA, Abelmann [place], Craver LS and Burchenal [facility] [phone]) The use of the nitrogen mustards in the palliative treatment of carcinoma: with particular reference to bronchogenic carcinoma Cancer 1 634-56  LABORATORY DATA:  Lab Results  Component Value Date   WBC 6.6 02/04/2022   HGB 11.9 (L) 02/04/2022   HCT 35.4 (L) 02/04/2022   MCV 92.4 02/04/2022   PLT 387 02/04/2022   Lab Results  Component Value Date   NA 144 02/04/2022   K 3.1 (L) 02/04/2022   CL 109 02/04/2022   CO2 26 02/04/2022   Lab Results  Component Value Date   ALT 23 08/07/2021   AST 22 08/07/2021   ALKPHOS 57 08/07/2021   BILITOT 0.4 08/07/2021    PULMONARY FUNCTION TEST:   Review Flowsheet        No data to display          RADIOGRAPHY: MM CLIP PLACEMENT LEFT  Result Date: 03/24/2022 CLINICAL DATA:  Evaluate biopsy marker EXAM: 3D DIAGNOSTIC LEFT MAMMOGRAM POST ULTRASOUND BIOPSY COMPARISON:  Previous exam(s). FINDINGS: 3D Mammographic images were obtained following ultrasound guided biopsy of a left breast mass. The ribbon shaped clip is within the biopsied left breast mass. The mass identified on ultrasound correlates with the mass in the left breast. IMPRESSION: The ribbon shaped clip is within the biopsied left breast mass. The mass identified on ultrasound correlates with the mass in the left breast. Final Assessment: Post Procedure Mammograms for Marker Placement Electronically Signed   By: Dorise Bullion III M.D.   On: 03/24/2022 10:49  [place] LT BREAST BX W LOC DEV 1ST LESION IMG BX SPEC US GUIDE  Result Date: 03/24/2022 CLINICAL DATA:  Biopsy of a left breast mass EXAM: ULTRASOUND GUIDED LEFT BREAST CORE NEEDLE BIOPSY COMPARISON:  Previous exam(s). PROCEDURE: I met with the patient and we discussed the procedure of ultrasound-guided biopsy, including benefits and alternatives. We discussed the high likelihood of a successful procedure. We  discussed the risks of the procedure, including infection, bleeding, tissue injury, clip migration, and inadequate sampling. Informed written consent was given. The usual time-out protocol was performed immediately prior to the procedure. Lesion quadrant: 11:30 left breast Using sterile technique and 1% Lidocaine as local anesthetic, under direct ultrasound visualization, a 12 gauge spring-loaded device was used to perform biopsy of 11:30 left breast mass using a lateral approach. At the conclusion of the procedure a ribbon shaped tissue marker clip was deployed into the biopsy cavity. Follow up 2 view mammogram was performed and dictated separately. IMPRESSION: Ultrasound guided biopsy of an 11 30 left breast mass. No apparent complications. Electronically Signed   By: Dorise Bullion III M.D.   On: 03/24/2022 10:25  MM DIAG BREAST TOMO UNI LEFT  Result Date: 03/17/2022 CLINICAL DATA:  The patient was called back for a left breast mass. EXAM: DIGITAL DIAGNOSTIC UNILATERAL LEFT MAMMOGRAM WITH TOMOSYNTHESIS; ULTRASOUND LEFT BREAST LIMITED TECHNIQUE: Left digital diagnostic mammography and breast tomosynthesis was performed.; Targeted ultrasound examination of the left breast was performed. COMPARISON:  Previous exam(s). ACR Breast Density Category b: There are scattered areas of fibroglandular density. FINDINGS: The mass in the slightly medial superior left breast persists. Targeted ultrasound is performed, showing a mass at 11:30, 2 cm from the nipple measuring 7 x 3 x 8 mm, solid in appearance. No axillary adenopathy. IMPRESSION: New solid left breast mass identified at 11:30, 2 cm from the nipple. RECOMMENDATION: Recommend ultrasound-guided  biopsy of the new left breast mass at 11:30. I have discussed the findings and recommendations with the patient. If applicable, a reminder letter will be sent to the patient regarding the next appointment. BI-RADS CATEGORY  4: Suspicious. Electronically Signed   By: Dorise Bullion III M.D.   On: 03/17/2022 13:34  US BREAST LTD UNI LEFT INC AXILLA  Result Date: 03/17/2022 CLINICAL DATA:  The patient was called back for a left breast mass. EXAM: DIGITAL DIAGNOSTIC UNILATERAL LEFT MAMMOGRAM WITH TOMOSYNTHESIS; ULTRASOUND LEFT BREAST LIMITED TECHNIQUE: Left digital diagnostic mammography and breast tomosynthesis was performed.; Targeted ultrasound examination of the left breast was performed. COMPARISON:  Previous exam(s). ACR Breast Density Category b: There are scattered areas of fibroglandular density. FINDINGS: The mass in the slightly medial superior left breast persists. Targeted ultrasound is performed, showing a mass at 11:30, 2 cm from the nipple measuring 7 x 3 x 8 mm, solid in appearance. No axillary adenopathy. IMPRESSION: New solid left breast mass identified at 11:30, 2 cm from the nipple. RECOMMENDATION: Recommend ultrasound-guided biopsy of the new left breast mass at 11:30. I have discussed the findings and recommendations with the patient. If applicable, a reminder letter will be sent to the patient regarding the next appointment. BI-RADS CATEGORY  4: Suspicious. Electronically Signed   By: Dorise Bullion III M.D.   On: 03/17/2022 13:34     IMPRESSION: ***   Patient will be a good candidate for breast conservation with radiotherapy to the left breast. We discussed the general course of radiation, potential side effects, and toxicities with radiation and the patient is interested in this approach. ***   PLAN:  ***   ------------------------------------------------  Blair Promise, PhD, MD  This document serves as a record of services personally performed by Gery Pray, MD. It was created on his behalf by Roney Mans, a trained medical scribe. The creation of this record is based on the scribe's personal observations and the provider's statements to them. This document has been checked and approved by the attending provider.

## 2022-04-01 ENCOUNTER — Inpatient Hospital Stay: Payer: 59

## 2022-04-01 ENCOUNTER — Encounter: Payer: Self-pay | Admitting: Genetic Counselor

## 2022-04-01 ENCOUNTER — Inpatient Hospital Stay: Payer: 59 | Admitting: Licensed Clinical Social Worker

## 2022-04-01 ENCOUNTER — Encounter: Payer: Self-pay | Admitting: Hematology and Oncology

## 2022-04-01 ENCOUNTER — Encounter: Payer: 59 | Admitting: Genetic Counselor

## 2022-04-01 ENCOUNTER — Ambulatory Visit
Admission: RE | Admit: 2022-04-01 | Discharge: 2022-04-01 | Disposition: A | Discharge status: Home / Self Care | Payer: 59 | Source: Ambulatory Visit | Attending: Radiation Oncology | Admitting: Radiation Oncology

## 2022-04-01 ENCOUNTER — Other Ambulatory Visit: Payer: Self-pay

## 2022-04-01 ENCOUNTER — Ambulatory Visit: Payer: Self-pay | Admitting: Genetic Counselor

## 2022-04-01 ENCOUNTER — Ambulatory Visit: Payer: 59 | Admitting: Radiation Oncology

## 2022-04-01 ENCOUNTER — Ambulatory Visit: Payer: 59 | Admitting: Physical Therapy

## 2022-04-01 ENCOUNTER — Ambulatory Visit: Payer: Self-pay | Admitting: Surgery

## 2022-04-01 ENCOUNTER — Inpatient Hospital Stay: Payer: 59 | Attending: Hematology and Oncology | Admitting: Hematology and Oncology

## 2022-04-01 DIAGNOSIS — I10 Essential (primary) hypertension: Secondary | ICD-10-CM | POA: Diagnosis not present

## 2022-04-01 DIAGNOSIS — D0512 Intraductal carcinoma in situ of left breast: Secondary | ICD-10-CM | POA: Insufficient documentation

## 2022-04-01 DIAGNOSIS — Z800 Family history of malignant neoplasm of digestive organs: Secondary | ICD-10-CM | POA: Insufficient documentation

## 2022-04-01 DIAGNOSIS — Z87891 Personal history of nicotine dependence: Secondary | ICD-10-CM | POA: Insufficient documentation

## 2022-04-01 DIAGNOSIS — Z170 Estrogen receptor positive status [ER+]: Secondary | ICD-10-CM | POA: Insufficient documentation

## 2022-04-01 DIAGNOSIS — Z803 Family history of malignant neoplasm of breast: Secondary | ICD-10-CM | POA: Diagnosis not present

## 2022-04-01 DIAGNOSIS — Z1379 Encounter for other screening for genetic and chromosomal anomalies: Secondary | ICD-10-CM | POA: Insufficient documentation

## 2022-04-01 LAB — CBC WITH DIFFERENTIAL (CANCER CENTER ONLY)
Abs Immature Granulocytes: 0.01 10*3/uL (ref 0.00–0.07)
Basophils Absolute: 0.1 10*3/uL (ref 0.0–0.1)
Basophils Relative: 1 %
Eosinophils Absolute: 0.4 10*3/uL (ref 0.0–0.5)
Eosinophils Relative: 6 %
HCT: 37.6 % (ref 36.0–46.0)
Hemoglobin: 12.9 g/dL (ref 12.0–15.0)
Immature Granulocytes: 0 %
Lymphocytes Relative: 25 %
Lymphs Abs: 1.7 10*3/uL (ref 0.7–4.0)
MCH: 31.2 pg (ref 26.0–34.0)
MCHC: 34.3 g/dL (ref 30.0–36.0)
MCV: 90.8 fL (ref 80.0–100.0)
Monocytes Absolute: 0.5 10*3/uL (ref 0.1–1.0)
Monocytes Relative: 7 %
Neutro Abs: 4.1 10*3/uL (ref 1.7–7.7)
Neutrophils Relative %: 61 %
Platelet Count: 400 10*3/uL (ref 150–400)
RBC: 4.14 MIL/uL (ref 3.87–5.11)
RDW: 12.7 % (ref 11.5–15.5)
WBC Count: 6.8 10*3/uL (ref 4.0–10.5)
nRBC: 0 % (ref 0.0–0.2)

## 2022-04-01 LAB — CMP (CANCER CENTER ONLY)
ALT: 19 U/L (ref 0–44)
AST: 20 U/L (ref 15–41)
Albumin: 4.4 g/dL (ref 3.5–5.0)
Alkaline Phosphatase: 64 U/L (ref 38–126)
Anion gap: 6 (ref 5–15)
BUN: 14 mg/dL (ref 8–23)
CO2: 31 mmol/L (ref 22–32)
Calcium: 9.8 mg/dL (ref 8.9–10.3)
Chloride: 106 mmol/L (ref 98–111)
Creatinine: 0.92 mg/dL (ref 0.44–1.00)
GFR, Estimated: >60 mL/min (ref >60)
Glucose, Bld: 127 mg/dL — ABNORMAL HIGH (ref 70–99)
Potassium: 3.4 mmol/L — ABNORMAL LOW (ref 3.5–5.1)
Sodium: 143 mmol/L (ref 135–145)
Total Bilirubin: 0.3 mg/dL (ref 0.3–1.2)
Total Protein: 7.2 g/dL (ref 6.5–8.1)

## 2022-04-01 LAB — HM MAMMOGRAPHY

## 2022-04-01 LAB — GENETIC SCREENING ORDER

## 2022-04-01 NOTE — H&P (Signed)
Subjective    Breast MD 04/01/22 [place]/ [place]   Chief Complaint: Breast Cancer       History of Present Illness: Rebecca [name] is a 66 y.o. female who is seen today as an office consultation at the request of Dr. Chryl Heck for evaluation of Breast Cancer .     This is a 66 year old female in good health with a family history of breast cancer in her mother (diagnosed at age 48) as well as her daughter (diagnosed at age 44) who presents after recent screening mammogram.  This revealed a 7 x 3 x 8 mm area in the left upper inner quadrant located at 1130, 2 cm from the nipple.  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  ER/PR positive.  The patient is referred to breast [facility] for her initial evaluation.  No previous breast problems or breast surgery.     Review of Systems: A complete review of systems was obtained from the patient.  I have reviewed this information and discussed as appropriate with the patient.  See HPI as well for other ROS.   Review of Systems  Eyes:  Positive for blurred vision.        Medical History: Past Medical History      Past Medical History:  Diagnosis Date   Arthritis     Asthma, unspecified asthma severity, unspecified whether complicated, unspecified whether persistent     GERD (gastroesophageal reflux disease)     History of cancer     Hyperlipidemia     Hypertension     Sleep apnea             Patient Active Problem List  Diagnosis   Asthma   Intraductal carcinoma in situ of breast   Legal blindness   Vitamin D deficiency   OSA (obstructive sleep apnea)   OA (osteoarthritis) of knee   Essential hypertension, benign   Hyperlipidemia   Hearing loss in right ear   Ringing in right ear      Past Surgical History       Past Surgical History:  Procedure Laterality Date   .knee surgery  N/A 1992   HYSTERECTOMY VAGINAL   2006   .laparoscopic gastric banding  N/A 10/20/2011    ARTHROPLASTY TOTAL KNEE Right 08/18/2021   .eye surgery  N/A      Unknown date        Allergies      Allergies  Allergen Reactions   Hydrocodone Hives, Itching, Other (See Comments) and Shortness Of Breath   Sulfa (Sulfonamide Antibiotics) Hives              Current Outpatient Medications on File Prior to Visit  Medication Sig Dispense Refill   albuterol (PROVENTIL) 2.5 mg /3 mL (0.083 %) nebulizer solution Take 2.5 mg by nebulization every 4 (four) hours as needed for Wheezing or Shortness of Breath       albuterol 90 mcg/actuation inhaler Inhale 2 inhalations into the lungs every 4 (four) hours as needed for Wheezing (cough)       budesonide-formoteroL (SYMBICORT) 160-4.5 mcg/actuation inhaler Take 2 inhalations by mouth 2 (two) times daily       ergocalciferol, vitamin D2, 1,250 mcg (50,000 unit) capsule Take 50,000 Units by mouth every 7 (seven) days       hydroCHLOROthiazide (HYDRODIURIL) 12.5 MG tablet Take 12.5 mg by mouth once daily       lisinopriL (ZESTRIL) 30  MG tablet Take 1 tablet by mouth once daily       montelukast (SINGULAIR) 10 mg tablet Take 1 tablet by mouth at bedtime       multivitamin with minerals tablet Take 1 tablet by mouth daily with breakfast        No current facility-administered medications on file prior to visit.      Family History       Family History  Problem Relation Age of Onset   Breast cancer Mother     Cancer Mother     Coronary Artery Disease (Blocked arteries around heart) Father     High blood pressure (Hypertension) Father     Breast cancer Daughter          Social History        Tobacco Use  Smoking Status Former   Types: Cigarettes   Quit date: 1993   Years since quitting: 30.8  Smokeless Tobacco Never      Social History  Social History         Socioeconomic History   Marital status: Married  Tobacco Use   Smoking status: Former      Types: Cigarettes      Quit date: 1993      Years since quitting: 30.8    Smokeless tobacco: Never  Vaping Use   Vaping Use: Never used  Substance and Sexual Activity   Alcohol use: Never   Drug use: Never   Sexual activity: Defer        Objective:      Physical Exam    Constitutional:  WDWN in NAD, conversant, no obvious deformities; lying in bed comfortably Eyes:  Pupils equal, round; sclera anicteric; moist conjunctiva; no lid lag HENT:  Oral mucosa moist; good dentition  Neck:  No masses palpated, trachea midline; no thyromegaly Lungs:  CTA bilaterally; normal respiratory effort Breasts:  symmetric, no nipple changes; no palpable masses or lymphadenopathy on either side; slight bruising from biopsy in LUIQ CV:  Regular rate and rhythm; no murmurs; extremities well-perfused with no edema Abd:  +bowel sounds, soft, non-tender, no palpable organomegaly; no palpable hernias Musc:  Unable to assess gait; no apparent clubbing or cyanosis in extremities Lymphatic:  No palpable cervical or axillary lymphadenopathy Skin:  Warm, dry; no sign of jaundice Psychiatric - alert and oriented x 4; calm mood and affect     Labs, Imaging and Diagnostic Testing: Diagnosis Breast, left, needle core biopsy, 11:30 o'clock DUCTAL CARCINOMA IN SITU, INTERMEDIATE TO HIGH NUCLEAR GRADE, SOLID AND CRIBRIFORM TYPES WITH FOCAL NECROSIS NEGATIVE FOR INVASIVE CARCINOMA NEGATIVE FOR MICROCALCIFICATIONS TUMOR MEASURES 5 MM IN GREATEST LINEAR EXTENT PROGNOSTIC INDICATORS Results: IMMUNOHISTOCHEMICAL AND MORPHOMETRIC ANALYSIS PERFORMED MANUALLY Estrogen Receptor: 95%, POSITIVE, STRONG STAINING INTENSITY Progesterone Receptor: 40%, POSITIVE, STRONG STAINING INTENSITY REFERENCE RANGE ESTROGEN RECEPTOR NEGATIVE 0% POSITIVE =>1% REFERENCE RANGE PROGESTERONE RECEPTOR NEGATIVE 0% POSITIVE =>1% All controls stained appropriately Casimer Lanius MD Pathologist, Electronic Signature ( Signed 03/26/2022)   CLINICAL DATA:  The patient was called back for a left breast mass.    EXAM: DIGITAL DIAGNOSTIC UNILATERAL LEFT MAMMOGRAM WITH TOMOSYNTHESIS; ULTRASOUND LEFT BREAST LIMITED   TECHNIQUE: Left digital diagnostic mammography and breast tomosynthesis was performed.; Targeted ultrasound examination of the left breast was performed.   COMPARISON:  Previous exam(s).   ACR Breast Density Category b: There are scattered areas of fibroglandular density.   FINDINGS: The mass in the slightly medial superior left breast persists.   Targeted ultrasound is performed, showing a  mass at 11:30, 2 cm from the nipple measuring 7 x 3 x 8 mm, solid in appearance. No axillary adenopathy.   IMPRESSION: New solid left breast mass identified at 11:30, 2 cm from the nipple.   RECOMMENDATION: Recommend ultrasound-guided biopsy of the new left breast mass at 11:30.   I have discussed the findings and recommendations with the patient. If applicable, a reminder letter will be sent to the patient regarding the next appointment.   BI-RADS CATEGORY  4: Suspicious.     Electronically Signed   By: Dorise Bullion III M.D.   On: 03/17/2022 13:34     Assessment and Plan:  Diagnoses and all orders for this visit:   Intraductal carcinoma in situ of left breast     We discussed her case with the entire treatment team including oncology and radiation oncology as well as physical therapy and genetics.  She has a fairly small tumor with DCIS that seems amenable to breast conserving therapy.  Recommend left breast radioactive seed localized lumpectomy.  The surgical procedure has been discussed with the patient.  Potential risks, benefits, alternative treatments, and expected outcomes have been explained.  All of the patient's questions at this time have been answered.  The likelihood of reaching the patient's treatment goal is good.  The patient understand the proposed surgical procedure and wishes to proceed. No follow-ups on file.   Carlean Jews, MD  04/01/2022 11:33  AM

## 2022-04-01 NOTE — Progress Notes (Signed)
Briefly discussed previous negative genetic testing results with Rebecca [name] during breast multidisciplinary clinic from 2018 [facility].  Discussed all appropriate breast cancer genes were included in genetic testing and updated genetic testing is not indicated at this time.      Discussed that we do not know why she has breast cancer or why there is cancer in the family. It could be famillial, due to a change in a gene that she did not inherit, due to a different gene that we are not testing, or maybe our current technology may not be able to pick something up.  It will be important for her to keep in contact with genetics to keep up with whether additional testing may be needed.  Recommended family members notify providers about family history of breast cancer to ensure appropriate screening.

## 2022-04-01 NOTE — Progress Notes (Signed)
[facility] NOTE  Patient Care Team: Billie Ruddy, MD as PCP - General (Family Medicine) Himmelrich, Bryson Ha, RD (Inactive) as Dietitian Molli Posey, MD as Attending Physician (Obstetrics and Gynecology) Gwenette Greet, Armando Reichert, MD as Attending Physician (Pulmonary Disease) Gean Quint, MD (Inactive) as Attending Physician (Allergy) Rockwell Germany, RN as Oncology Nurse Navigator Mauro Kaufmann, RN as Oncology Nurse Navigator Donnie Mesa, MD as Consulting Physician (General Surgery) Benay Pike, MD as Consulting Physician (Hematology and Oncology) Gery Pray, MD as Consulting Physician (Radiation Oncology)  CHIEF COMPLAINTS/PURPOSE OF CONSULTATION:  Newly diagnosed breast cancer  HISTORY OF PRESENTING ILLNESS:  Rebecca [name] 66 y.o. female is here because of recent diagnosis of left DCIS.  I reviewed her records extensively and collaborated the history with the patient.  SUMMARY OF ONCOLOGIC HISTORY: Oncology History  Ductal carcinoma in situ (DCIS) of left breast  03/17/2022 Mammogram   New solid left breast mass identified at 11:30, 2 cm from the nipple. Targeted ultrasound is performed, showing a mass at 11:30, 2 cm from the nipple measuring 7 x 3 x 8 mm, solid in appearance. No axillary adenopathy.   03/27/2022 Initial Diagnosis   Ductal carcinoma in situ (DCIS) of left breast    Pathology Results   Pathology from left breast showed DCIS, intermediate to high nuclear grade, solid and cribriform types with focal necrosis.  Negative for invasive carcinoma.  Negative for microcalcifications.  Prognostics showed ER 95% positive strong staining PR 40% positive strong staining.    Patient arrived to the appointment today by herself.  She was wondering if she can record her conversation today for her family members.  She denies any major medical comorbidities except for allergies and high blood pressure.  She has significant family history of  breast cancer in her mom at the age of 59 who died from breast cancer and also has a daughter who had breast cancer in her early 1s.  She reports knee replacement in March 2023 and she is still recovering from it.  She denies any personal history of DVT/PE.  She has 3 kids youngest is 54 years old.  She is a never smoker.  She does not drink any alcohol.  She works as a Technical brewer.  She may have used hormone replacement therapy for a brief time several years ago for about a year.  Rest of the pertinent 10 point ROS reviewed and negative  MEDICAL HISTORY:  Past Medical History:  Diagnosis Date   Arthritis    Asthma    Blindness, legal    L EYE   Breast cancer ([place])    GERD (gastroesophageal reflux disease)    Hearing loss    History of kidney stones    Hyperlipidemia    Hypertension    Lapband May 2013 11/18/2011   Migraines    Obesity, Class III, BMI 40-49.9 (morbid obesity) ([place]) 06/03/2011   Pre-diabetes    Sleep apnea    uses c-pap    SURGICAL HISTORY: Past Surgical History:  Procedure Laterality Date   ABDOMINAL HYSTERECTOMY  2006   fibroids   BREATH TEK H PYLORI  08/17/2011   Procedure: BREATH TEK H PYLORI;  Surgeon: Pedro Earls, MD;  Location: [facility];  Service: General;  Laterality: N/A;  PATIENT WILL COME AT 0715   COLONOSCOPY  2008   @ [facility]     Patient unsure of surgery date. Left eye  KNEE SURGERY  1992   right   LAPAROSCOPIC GASTRIC BANDING  10/20/2011   Procedure: LAPAROSCOPIC GASTRIC BANDING;  Surgeon: Pedro Earls, MD;  Location: [facility];  Service: General;  Laterality: N/A;   TOTAL KNEE ARTHROPLASTY Right 08/18/2021   Procedure: TOTAL KNEE ARTHROPLASTY;  Surgeon: Gaynelle Arabian, MD;  Location: [facility];  Service: Orthopedics;  Laterality: Right;    SOCIAL HISTORY: Social History   Socioeconomic History   Marital status: Married    Spouse name: Not on file   Number of children: Not on file   Years of education: Not on  file   Highest education level: Not on file  Occupational History   Not on file  Tobacco Use   Smoking status: Former    Packs/day: 0.50    Years: 15.00    Total pack years: 7.50    Types: Cigarettes    Quit date: 06/02/1994    Years since quitting: 27.8   Smokeless tobacco: Never  Vaping Use   Vaping Use: Never used  Substance and Sexual Activity   Alcohol use: No   Drug use: No   Sexual activity: Not on file  Other Topics Concern   Not on file  Social History Narrative   Work or School: works part time with Physicist, 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       Home Situation: lives with husband      Spiritual Beliefs: Christian  - Education administrator, Dentist      Lifestyle: wt watchers 2019, starting to exercise         Social Determinants of Health   Financial Resource Strain: [place]  (10/06/2021)   Overall Financial Resource Strain (CARDIA)    Difficulty of Paying Living Expenses: Not hard at all  Food Insecurity: No [place] (10/06/2021)   Hunger Vital Sign    Worried About Running Out of Food in the Last Year: Never true    [place] in the Last Year: Never true  Transportation Needs: No Transportation Needs (10/06/2021)   PRAPARE - Hydrologist (Medical): No    Lack of Transportation (Non-Medical): No  Physical Activity: Inactive (10/06/2021)   Exercise Vital Sign    Days of Exercise per Week: 0 days    Minutes of Exercise per Session: 0 min  Stress: No Stress Concern Present (10/06/2021)   [place]    Feeling of Stress : Not at all  Social Connections: Not on file  Intimate Partner Violence: Not on file    FAMILY HISTORY: Family History  Problem Relation Age of Onset   Breast cancer Mother 53   Alcohol abuse Father    Heart disease Father 49       MI   Hypertension Father    Colon cancer Brother        dx after 65    Breast cancer Daughter 39    ALLERGIES:  is allergic to hydrocodone and sulfa antibiotics.  MEDICATIONS:  Current Outpatient Medications  Medication Sig Dispense Refill   lisinopril (ZESTRIL) 30 MG tablet Take 1 tablet (30 mg total) by mouth daily. 90 tablet 0   montelukast (SINGULAIR) 10 MG tablet TAKE 1 TABLET BY MOUTH EVERYDAY AT BEDTIME 30 tablet 0   Vitamin D, Ergocalciferol, (DRISDOL) 1.25 MG (50000 UNIT) CAPS capsule Take 1 capsule (50,000 Units total) by mouth every 7 (seven) days. (Patient taking  differently: Take 50,000 Units by mouth every 7 (seven) days. Friday) 12 capsule 0   albuterol (PROVENTIL) (2.5 MG/3ML) 0.083% nebulizer solution Take 3 mLs (2.5 mg total) by nebulization every 4 (four) hours as needed for wheezing or shortness of breath. 75 mL 1   albuterol (VENTOLIN HFA) 108 (90 Base) MCG/ACT inhaler Use 2 puffs every 4 hours as needed for cough or wheeze.  May use  2 puffs 10-20 minutes prior to exercise. 18 g 1   ALVESCO 160 MCG/ACT inhaler Inhale 1 puff into the lungs 2 (two) times daily. 1 each 2   aspirin EC 325 MG EC tablet Take 1 tablet (325 mg total) by mouth 2 (two) times daily. Then take one 81 mg aspirin once a day for three weeks. Then discontinue aspirin. (Patient not taking: Reported on 10/06/2021) 42 tablet 0   atorvastatin (LIPITOR) 20 MG tablet TAKE 1 TABLET BY MOUTH EVERY DAY (Patient not taking: Reported on 10/06/2021) 90 tablet 0   benzonatate (TESSALON) 100 MG capsule Take 1 capsule (100 mg total) by mouth every 8 (eight) hours. 21 capsule 0   cetirizine (ZYRTEC ALLERGY) 10 MG tablet Take 1 tablet (10 mg total) by mouth daily. 30 tablet 5   gabapentin (NEURONTIN) 300 MG capsule Take a 300 mg capsule three times a day for two weeks following surgery.Then take a 300 mg capsule two times a day for two weeks. Then take a 300 mg capsule once a day for two weeks. Then discontinue. (Patient not taking: Reported on 10/06/2021) 84 capsule 0   hydrochlorothiazide  (HYDRODIURIL) 12.5 MG tablet TAKE 1 TABLET BY MOUTH EVERY DAY 90 tablet 1   HYDROmorphone (DILAUDID) 2 MG tablet Take 1-2 tablets (2-4 mg total) by mouth every 6 (six) hours as needed for severe pain. (Patient not taking: Reported on 10/06/2021) 30 tablet 0   meclizine (ANTIVERT) 25 MG tablet Take 1 tablet (25 mg total) by mouth 3 (three) times daily as needed for dizziness. (Patient not taking: Reported on 10/06/2021) 30 tablet 0   Multiple Vitamins-Minerals (MULTIVITAMIN WITH MINERALS) tablet Take 1 tablet by mouth daily with breakfast.     Olopatadine-Mometasone (RYALTRIS) 665-25 MCG/ACT SUSP Place 2 sprays into the nose daily as needed. 29 g 5   RYALTRIS 665-25 MCG/ACT SUSP Place 2 sprays into both nostrils 2 (two) times daily. 29 g 2   No current facility-administered medications for this visit.    REVIEW OF SYSTEMS:   Constitutional: Denies fevers, chills or abnormal night sweats Eyes: Denies blurriness of vision, double vision or watery eyes Ears, nose, mouth, throat, and face: Denies mucositis or sore throat Respiratory: Denies cough, dyspnea or wheezes Cardiovascular: Denies palpitation, chest discomfort or lower extremity swelling Gastrointestinal:  Denies nausea, heartburn or change in bowel habits Skin: Denies abnormal skin rashes Lymphatics: Denies new lymphadenopathy or easy bruising Neurological:Denies numbness, tingling or new weaknesses Behavioral/Psych: Mood is stable, no new changes  Breast: Denies any palpable lumps or discharge All other systems were reviewed with the patient and are negative.  PHYSICAL EXAMINATION: ECOG PERFORMANCE STATUS: 0 - Asymptomatic  Vitals:   04/01/22 0859  BP: (!) 145/73  Pulse: 84  Resp: 16  Temp: 97.8 F (36.6 C)  SpO2: 95%   Filed Weights   04/01/22 0859  Weight: 193 lb 4.8 oz (87.7 kg)    GENERAL:alert, no distress and comfortable SKIN: skin color, texture, turgor are normal, no rashes or significant lesions EYES: normal,  conjunctiva are pink and non-injected, sclera clear  OROPHARYNX:no exudate, no erythema and lips, buccal mucosa, and tongue normal  NECK: supple, thyroid normal size, non-tender, without nodularity LYMPH:  no palpable lymphadenopathy in the cervical, axillary LUNGS: clear to auscultation and percussion with normal breathing effort HEART: regular rate & rhythm and no murmurs and no lower extremity edema ABDOMEN:abdomen soft, non-tender and normal bowel sounds Musculoskeletal:no cyanosis of digits and no clubbing  PSYCH: alert & oriented x 3 with fluent speech NEURO: no focal motor/sensory deficits BREAST: No palpable nodules in the breast.  Postbiopsy changes noted.  No regional adenopathy.  LABORATORY DATA:  I have reviewed the data as listed Lab Results  Component Value Date   WBC 6.8 04/01/2022   HGB 12.9 04/01/2022   HCT 37.6 04/01/2022   MCV 90.8 04/01/2022   PLT 400 04/01/2022   Lab Results  Component Value Date   NA 143 04/01/2022   K 3.4 (L) 04/01/2022   CL 106 04/01/2022   CO2 31 04/01/2022    RADIOGRAPHIC STUDIES: I have personally reviewed the radiological reports and agreed with the findings in the report.  ASSESSMENT AND PLAN:  Ductal carcinoma in situ (DCIS) of left breast This is a very pleasant 66 year old female patient with left breast DCIS intermediate to high nuclear grade ER/PR positive referred to breast [place] for recommendations.   We have discussed the following details about DCIS.   Pathology review: I discussed with the patient the difference between DCIS and invasive breast cancer. It is considered a precancerous lesion. DCIS is classified as a Stage 0 breast cancer. It is generally detected through mammograms as calcifications. We discussed the significance of grades and its impact on prognosis. We also discussed the importance of ER and PR receptors and their implications to adjuvant treatment options. Prognosis of DCIS dependence on grade and degree of  comedo necrosis. It is anticipated that if not treated, 20-30% of DCIS can develop into invasive breast cancer.  Recommendation: 1. Breast conserving surgery 2. Followed by adjuvant radiation therapy 3. Followed by antiestrogen therapy with tamoxifen/aromatase inhibitors based on menopausal status 5 years  Tamoxifen counseling: We discussed the risks and benefits of tamoxifen. These include but not limited to insomnia, hot flashes, mood changes, vaginal dryness, and weight gain. Although rare, serious side effects including endometrial cancer, risk of blood clots were also discussed. We strongly believe that the benefits far outweigh the risks. Patient understands these risks and consented to starting treatment. Planned treatment duration is 5 years.  Aromatase inhibitors counseling: We have discussed the mechanism of action of aromatase inhibitors today.  We have discussed adverse effects including but not limited to menopausal symptoms, increased risk of osteoporosis and fractures, cardiovascular events, arthralgias and myalgias.  We do believe that the benefits far outweigh the risks.  Plan treatment duration of 5 years.  At this time she is leaning towards tamoxifen since she is worried about bone density loss.  We will however review this treatment options when she comes back for follow-up.  All her questions were answered to the best of my knowledge.  We will try to obtain her previous genetic testing report given her family history.   All questions were answered. The patient knows to call the clinic with any problems, questions or concerns.    Benay Pike, MD 04/01/22

## 2022-04-01 NOTE — Research (Signed)
MTG-015 - Tissue and Bodily Fluids: Translational Medicine: Discovery and Evaluation of Biomarkers/Pharmacogenomics for the Diagnosis and Personalized Management of Patients   Patient Rebecca [name] was identified by Dr Chryl Heck as a potential candidate for the above listed study. This Clinical Research Nurse met with [name], [id number], on 04/01/22 in a manner and location that ensures patient privacy to discuss participation in the above listed research study. Patient is Unaccompanied. A copy of the informed consent document and separate HIPAA Authorization was provided to the patient. Patient reads, speaks, and understands [place].    Patient was provided with the business card of this Nurse and encouraged to contact the research team with any questions. Approximately 15 minutes were spent with the patient reviewing the informed consent documents. Patient was provided the option of taking informed consent documents home to review and was encouraged to review at their convenience with their support network, including other care providers. Patient took the consent documents home to review.  Patient agreed to a follow-up call from the research team on Monday, 10/23.  Vickii Penna, RN, BSN, CPN Clinical Research Nurse I [phone]  04/01/2022 11:06 AM

## 2022-04-01 NOTE — Progress Notes (Signed)
[place] Work  Initial Assessment   Rebecca [name] is a 66 y.o. year old female presenting alone. Clinical Social Work was referred by  [facility]  for assessment of psychosocial needs.   SDOH (Social Determinants of Health) assessments performed: Yes SDOH Interventions    Flowsheet Row Clinical Support from 04/01/2022 in [facility]  SDOH Interventions   Food Insecurity Interventions Intervention Not Indicated  Housing Interventions Intervention Not Indicated  Transportation Interventions Intervention Not Indicated  Financial Strain Interventions Intervention Not Indicated       SDOH Screenings   Food Insecurity: No Food Insecurity (04/01/2022)  Housing: Low Risk  (04/01/2022)  Transportation Needs: No Transportation Needs (04/01/2022)  Depression (PHQ2-9): Low Risk  (10/06/2021)  Financial Resource Strain: Low Risk  (04/01/2022)  Physical Activity: Inactive (10/06/2021)  Stress: No Stress Concern Present (10/06/2021)  Tobacco Use: Medium Risk (04/01/2022)     Distress Screen completed: Yes    04/01/2022   11:30 AM  ONCBCN DISTRESS SCREENING  Screening Type Initial Screening  Distress experienced in past week (1-10) 7  Emotional problem type Depression;Nervousness/Anxiety  Physical Problem type Sleep/insomnia      Family/Social Information:  Housing Arrangement: patient lives with husband  (he just had hip replacement), 39yo special needs son. Currently, grandson (52yo) staying with them temporarily Family members/support persons in your life? 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  Income source: Paediatric nurse concerns: No Type of concern: None Food access concerns: no Religious or spiritual practice: Yes-strong connection to [employer] Currently in place:  n/a  Coping/ Adjustment to  diagnosis: Patient understands treatment plan and what happens next? yes, is nervous, especially about radiation after seeing a friend's experience but understands the plan and that she can ask questions to the medical team Concerns about diagnosis and/or treatment: Pain or discomfort during procedures and Overwhelmed by information Patient reported stressors: Anxiety/ nervousness Patient enjoys reading, time with family/ friends, and travel, worship Current coping skills/ strengths: Capable of independent living , Motivation for treatment/growth , Religious Affiliation , Special hobby/interest , and Supportive family/friends     SUMMARY: Current SDOH Barriers:  Emotional adjustment to cancer diagnosis  Clinical Social Work Clinical Goal(s):  Patient will work with SW to address concerns related to adjustment  Interventions: Discussed common feeling and emotions when being diagnosed with cancer, and the importance of support during treatment Informed patient of the support team roles and support services at [facility] Provided CSW contact information and encouraged patient to call with any questions or concerns   Follow Up Plan: CSW will follow-up with patient by phone  Patient verbalizes understanding of plan: Yes    Kristiane Morsch E Nealie Mchatton, LCSW

## 2022-04-01 NOTE — Assessment & Plan Note (Signed)
This is a very pleasant 66 year old female patient with left breast DCIS intermediate to high nuclear grade ER/PR positive referred to breast [place] for recommendations.   We have discussed the following details about DCIS.   Pathology review: I discussed with the patient the difference between DCIS and invasive breast cancer. It is considered a precancerous lesion. DCIS is classified as a Stage 0 breast cancer. It is generally detected through mammograms as calcifications. We discussed the significance of grades and its impact on prognosis. We also discussed the importance of ER and PR receptors and their implications to adjuvant treatment options. Prognosis of DCIS dependence on grade and degree of comedo necrosis. It is anticipated that if not treated, 20-30% of DCIS can develop into invasive breast cancer.  Recommendation: 1. Breast conserving surgery 2. Followed by adjuvant radiation therapy 3. Followed by antiestrogen therapy with tamoxifen/aromatase inhibitors based on menopausal status 5 years  Tamoxifen counseling: We discussed the risks and benefits of tamoxifen. These include but not limited to insomnia, hot flashes, mood changes, vaginal dryness, and weight gain. Although rare, serious side effects including endometrial cancer, risk of blood clots were also discussed. We strongly believe that the benefits far outweigh the risks. Patient understands these risks and consented to starting treatment. Planned treatment duration is 5 years.  Aromatase inhibitors counseling: We have discussed the mechanism of action of aromatase inhibitors today.  We have discussed adverse effects including but not limited to menopausal symptoms, increased risk of osteoporosis and fractures, cardiovascular events, arthralgias and myalgias.  We do believe that the benefits far outweigh the risks.  Plan treatment duration of 5 years.  At this time she is leaning towards tamoxifen since she is worried about bone  density loss.  We will however review this treatment options when she comes back for follow-up.  All her questions were answered to the best of my knowledge.  We will try to obtain her previous genetic testing report given her family history.

## 2022-04-01 NOTE — H&P (View-Only) (Signed)
Subjective    Breast MD 04/01/22 [place]/ [place]   Chief Complaint: Breast Cancer       History of Present Illness: Rebecca [name] is a 66 y.o. female who is seen today as an office consultation at the request of Dr. Chryl Heck for evaluation of Breast Cancer .     This is a 66 year old female in good health with a family history of breast cancer in her mother (diagnosed at age 57) as well as her daughter (diagnosed at age 58) who presents after recent screening mammogram.  This revealed a 7 x 3 x 8 mm area in the left upper inner quadrant located at 1130, 2 cm from the nipple.  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  ER/PR positive.  The patient is referred to breast [place] for her initial evaluation.  No previous breast problems or breast surgery.     Review of Systems: A complete review of systems was obtained from the patient.  I have reviewed this information and discussed as appropriate with the patient.  See HPI as well for other ROS.   Review of Systems  Eyes:  Positive for blurred vision.        Medical History: Past Medical History      Past Medical History:  Diagnosis Date   Arthritis     Asthma, unspecified asthma severity, unspecified whether complicated, unspecified whether persistent     GERD (gastroesophageal reflux disease)     History of cancer     Hyperlipidemia     Hypertension     Sleep apnea             Patient Active Problem List  Diagnosis   Asthma   Intraductal carcinoma in situ of breast   Legal blindness   Vitamin D deficiency   OSA (obstructive sleep apnea)   OA (osteoarthritis) of knee   Essential hypertension, benign   Hyperlipidemia   Hearing loss in right ear   Ringing in right ear      Past Surgical History       Past Surgical History:  Procedure Laterality Date   .knee surgery  N/A 1992   HYSTERECTOMY VAGINAL   2006   .laparoscopic gastric banding  N/A 10/20/2011    ARTHROPLASTY TOTAL KNEE Right 08/18/2021   .eye surgery  N/A      Unknown date        Allergies      Allergies  Allergen Reactions   Hydrocodone Hives, Itching, Other (See Comments) and Shortness Of Breath   Sulfa (Sulfonamide Antibiotics) Hives              Current Outpatient Medications on File Prior to Visit  Medication Sig Dispense Refill   albuterol (PROVENTIL) 2.5 mg /3 mL (0.083 %) nebulizer solution Take 2.5 mg by nebulization every 4 (four) hours as needed for Wheezing or Shortness of Breath       albuterol 90 mcg/actuation inhaler Inhale 2 inhalations into the lungs every 4 (four) hours as needed for Wheezing (cough)       budesonide-formoteroL (SYMBICORT) 160-4.5 mcg/actuation inhaler Take 2 inhalations by mouth 2 (two) times daily       ergocalciferol, vitamin D2, 1,250 mcg (50,000 unit) capsule Take 50,000 Units by mouth every 7 (seven) days       hydroCHLOROthiazide (HYDRODIURIL) 12.5 MG tablet Take 12.5 mg by mouth once daily       lisinopriL (ZESTRIL) 30  MG tablet Take 1 tablet by mouth once daily       montelukast (SINGULAIR) 10 mg tablet Take 1 tablet by mouth at bedtime       multivitamin with minerals tablet Take 1 tablet by mouth daily with breakfast        No current facility-administered medications on file prior to visit.      Family History       Family History  Problem Relation Age of Onset   Breast cancer Mother     Cancer Mother     Coronary Artery Disease (Blocked arteries around heart) Father     High blood pressure (Hypertension) Father     Breast cancer Daughter          Social History        Tobacco Use  Smoking Status Former   Types: Cigarettes   Quit date: 1993   Years since quitting: 30.8  Smokeless Tobacco Never      Social History  Social History         Socioeconomic History   Marital status: Married  Tobacco Use   Smoking status: Former      Types: Cigarettes      Quit date: 1993      Years since quitting: 30.8    Smokeless tobacco: Never  Vaping Use   Vaping Use: Never used  Substance and Sexual Activity   Alcohol use: Never   Drug use: Never   Sexual activity: Defer        Objective:      Physical Exam    Constitutional:  WDWN in NAD, conversant, no obvious deformities; lying in bed comfortably Eyes:  Pupils equal, round; sclera anicteric; moist conjunctiva; no lid lag HENT:  Oral mucosa moist; good dentition  Neck:  No masses palpated, trachea midline; no thyromegaly Lungs:  CTA bilaterally; normal respiratory effort Breasts:  symmetric, no nipple changes; no palpable masses or lymphadenopathy on either side; slight bruising from biopsy in LUIQ CV:  Regular rate and rhythm; no murmurs; extremities well-perfused with no edema Abd:  +bowel sounds, soft, non-tender, no palpable organomegaly; no palpable hernias Musc:  Unable to assess gait; no apparent clubbing or cyanosis in extremities Lymphatic:  No palpable cervical or axillary lymphadenopathy Skin:  Warm, dry; no sign of jaundice Psychiatric - alert and oriented x 4; calm mood and affect     Labs, Imaging and Diagnostic Testing: Diagnosis Breast, left, needle core biopsy, 11:30 o'clock DUCTAL CARCINOMA IN SITU, INTERMEDIATE TO HIGH NUCLEAR GRADE, SOLID AND CRIBRIFORM TYPES WITH FOCAL NECROSIS NEGATIVE FOR INVASIVE CARCINOMA NEGATIVE FOR MICROCALCIFICATIONS TUMOR MEASURES 5 MM IN GREATEST LINEAR EXTENT PROGNOSTIC INDICATORS Results: IMMUNOHISTOCHEMICAL AND MORPHOMETRIC ANALYSIS PERFORMED MANUALLY Estrogen Receptor: 95%, POSITIVE, STRONG STAINING INTENSITY Progesterone Receptor: 40%, POSITIVE, STRONG STAINING INTENSITY REFERENCE RANGE ESTROGEN RECEPTOR NEGATIVE 0% POSITIVE =>1% REFERENCE RANGE PROGESTERONE RECEPTOR NEGATIVE 0% POSITIVE =>1% All controls stained appropriately Casimer Lanius MD Pathologist, Electronic Signature ( Signed 03/26/2022)   CLINICAL DATA:  The patient was called back for a left breast mass.    EXAM: DIGITAL DIAGNOSTIC UNILATERAL LEFT MAMMOGRAM WITH TOMOSYNTHESIS; ULTRASOUND LEFT BREAST LIMITED   TECHNIQUE: Left digital diagnostic mammography and breast tomosynthesis was performed.; Targeted ultrasound examination of the left breast was performed.   COMPARISON:  Previous exam(s).   ACR Breast Density Category b: There are scattered areas of fibroglandular density.   FINDINGS: The mass in the slightly medial superior left breast persists.   Targeted ultrasound is performed, showing a  mass at 11:30, 2 cm from the nipple measuring 7 x 3 x 8 mm, solid in appearance. No axillary adenopathy.   IMPRESSION: New solid left breast mass identified at 11:30, 2 cm from the nipple.   RECOMMENDATION: Recommend ultrasound-guided biopsy of the new left breast mass at 11:30.   I have discussed the findings and recommendations with the patient. If applicable, a reminder letter will be sent to the patient regarding the next appointment.   BI-RADS CATEGORY  4: Suspicious.     Electronically Signed   By: Dorise Bullion III M.D.   On: 03/17/2022 13:34     Assessment and Plan:  Diagnoses and all orders for this visit:   Intraductal carcinoma in situ of left breast     We discussed her case with the entire treatment team including oncology and radiation oncology as well as physical therapy and genetics.  She has a fairly small tumor with DCIS that seems amenable to breast conserving therapy.  Recommend left breast radioactive seed localized lumpectomy.  The surgical procedure has been discussed with the patient.  Potential risks, benefits, alternative treatments, and expected outcomes have been explained.  All of the patient's questions at this time have been answered.  The likelihood of reaching the patient's treatment goal is good.  The patient understand the proposed surgical procedure and wishes to proceed. No follow-ups on file.   Rebecca Jews, MD  04/01/2022 11:33  AM

## 2022-04-02 ENCOUNTER — Encounter: Payer: Self-pay | Admitting: Family Medicine

## 2022-04-02 ENCOUNTER — Other Ambulatory Visit: Payer: Self-pay | Admitting: *Deleted

## 2022-04-02 DIAGNOSIS — D0512 Intraductal carcinoma in situ of left breast: Secondary | ICD-10-CM

## 2022-04-03 ENCOUNTER — Ambulatory Visit (INDEPENDENT_AMBULATORY_CARE_PROVIDER_SITE_OTHER): Payer: 59 | Admitting: Family Medicine

## 2022-04-03 ENCOUNTER — Other Ambulatory Visit: Payer: Self-pay | Admitting: Surgery

## 2022-04-03 VITALS — BP 134/82 | HR 74 | Temp 98.4°F | Ht 63.0 in | Wt 194.0 lb

## 2022-04-03 DIAGNOSIS — R7303 Prediabetes: Secondary | ICD-10-CM | POA: Diagnosis not present

## 2022-04-03 DIAGNOSIS — D0512 Intraductal carcinoma in situ of left breast: Secondary | ICD-10-CM

## 2022-04-03 DIAGNOSIS — E782 Mixed hyperlipidemia: Secondary | ICD-10-CM

## 2022-04-03 DIAGNOSIS — Z96651 Presence of right artificial knee joint: Secondary | ICD-10-CM

## 2022-04-03 DIAGNOSIS — Z23 Encounter for immunization: Secondary | ICD-10-CM

## 2022-04-03 DIAGNOSIS — Z0000 Encounter for general adult medical examination without abnormal findings: Secondary | ICD-10-CM | POA: Diagnosis not present

## 2022-04-03 DIAGNOSIS — M25561 Pain in right knee: Secondary | ICD-10-CM

## 2022-04-03 DIAGNOSIS — I10 Essential (primary) hypertension: Secondary | ICD-10-CM | POA: Diagnosis not present

## 2022-04-03 DIAGNOSIS — G8929 Other chronic pain: Secondary | ICD-10-CM

## 2022-04-03 NOTE — Progress Notes (Signed)
Subjective:     Rebecca [name] is a 66 y.o. female and is here for a comprehensive physical exam. The patient reports scheduled for surgery in a few wks for L breast DCIS.  Pt trying to remain positive.  Pt endorses R knee pain.  S/p R TKR.  Using OTC meds.  Pt inquires about immunizations interested in flu and shingles.  Pt had Zoster vac 05/03/15.  Social History   Socioeconomic History   Marital status: Married    Spouse name: Not on file   Number of children: Not on file   Years of education: Not on file   Highest education level: Not on file  Occupational History   Not on file  Tobacco Use   Smoking status: Former    Packs/day: 0.50    Years: 15.00    Total pack years: 7.50    Types: Cigarettes    Quit date: 06/02/1994    Years since quitting: 27.8   Smokeless tobacco: Never  Vaping Use   Vaping Use: Never used  Substance and Sexual Activity   Alcohol use: No   Drug use: No   Sexual activity: Not on file  Other Topics Concern   Not on file  Social History Narrative   Work or School: works part time with [employer] - 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       Home Situation: lives with husband      Spiritual Beliefs: Christian  - Education administrator, Dentist      Lifestyle: wt watchers 2019, starting to exercise         Social Determinants of Health   Financial Resource Strain: [place]  (04/01/2022)   Overall Financial Resource Strain (CARDIA)    Difficulty of Paying Living Expenses: Not very hard  Food Insecurity: No Food Insecurity (04/01/2022)   Hunger Vital Sign    Worried About Running Out of Food in the Last Year: Never true    [place] in the Last Year: Never true  Transportation Needs: No Transportation Needs (04/01/2022)   PRAPARE - Hydrologist (Medical): No    Lack of Transportation (Non-Medical): No  Physical Activity: Inactive (10/06/2021)   Exercise Vital Sign    Days of Exercise per  Week: 0 days    Minutes of Exercise per Session: 0 min  Stress: No Stress Concern Present (10/06/2021)   [place]    Feeling of Stress : Not at all  Social Connections: Not on file  Intimate Partner Violence: Not on file   Health Maintenance  Topic Date Due   Zoster Vaccines- Shingrix (1 of 2) Never done   TETANUS/TDAP  11/13/2017   Pneumonia Vaccine 23+ Years old (2 - PCV) 08/16/2020   INFLUENZA VACCINE  01/13/2022   COVID-19 Vaccine (5 - Pfizer risk series) 03/04/2022   MAMMOGRAM  04/01/2024   COLONOSCOPY (Pts 45-89yr Insurance coverage will need to be confirmed)  08/07/2027   DEXA SCAN  Completed   Hepatitis C Screening  Completed   HPV VACCINES  Aged Out    The following portions of the patient's history were reviewed and updated as appropriate: allergies, current medications, past family history, past medical history, past social history, past surgical history, and problem list.  Review of Systems Pertinent items noted in HPI and remainder of comprehensive ROS otherwise negative.   Objective:    BP 134/82 (BP Location:  Right Arm, Patient Position: Sitting, Cuff Size: Normal)   Pulse 74   Temp 98.4 F (36.9 C) (Oral)   Ht '5\' 3"'$  (1.6 m)   Wt 194 lb (88 kg)   SpO2 96%   BMI 34.37 kg/m  General appearance: alert, cooperative, and no distress Head: Normocephalic, without obvious abnormality, atraumatic Eyes: conjunctivae/corneas clear. PERRL, EOM's intact. Fundi benign. Ears: normal TM's and external ear canals both ears Nose: Nares normal. Septum midline. Mucosa normal. No drainage or sinus tenderness. Throat: lips, mucosa, and tongue normal; teeth and gums normal Neck: no adenopathy, no carotid bruit, no JVD, supple, symmetrical, trachea midline, and thyroid not enlarged, symmetric, no tenderness/mass/nodules Lungs: clear to auscultation bilaterally Heart: regular rate and rhythm, S1, S2 normal, no  murmur, click, rub or gallop Abdomen: soft, non-tender; bowel sounds normal; no masses,  no organomegaly Extremities: extremities normal, atraumatic, no cyanosis or edema Pulses: 2+ and symmetric Skin: Skin color, texture, turgor normal. No rashes or lesions Lymph nodes: Cervical, supraclavicular, and axillary nodes normal. Neurologic: Alert and oriented X 3, normal strength and tone. Normal symmetric reflexes. Normal coordination and gait    Assessment:    Healthy female exam.      Plan:    Anticipatory guidance given including wearing seatbelts, smoke detectors in the home, increasing physical activity, increasing p.o. intake of water and vegetables. -labs.  CBC, CMP done 04/01/22 -immunizations reviewed -colonoscopy done 08/06/2017 -mammogram done 03/17/22 -given handout -Next CPE in 1 yr See After Visit Summary for Counseling Recommendations   Prediabetes -Hemoglobin A1c 5.9% on 07/10/2021 -Continue lifestyle modifications  - Plan: Hemoglobin A1c  Mixed hyperlipidemia  Total cholesterol 228, HDL 45.1, LDL 153, triglycerides 148 on 11/27/2020 -Lifestyle modifications - Plan: Lipid panel  Essential hypertension -Elevated -Recheck -Continue current medications including lisinopril 30 mg daily and HCTZ 12.5 mg daily  Chronic pain of right knee -Continue supportive care -Follow-up with Ortho for continued or worsening symptoms  S/P total knee arthroplasty, right  Ductal carcinoma in situ (DCIS) of left breast -Unilateral mammogram done 03/17/2022 -Lumpectomy scheduled in a few weeks  Need for influenza vaccination  - Plan: Flu Vaccine QUAD High Dose(Fluad)  F/u prn  Grier Mitts, MD

## 2022-04-06 ENCOUNTER — Telehealth: Payer: Self-pay | Admitting: Emergency Medicine

## 2022-04-06 ENCOUNTER — Encounter: Payer: Self-pay | Admitting: *Deleted

## 2022-04-06 NOTE — Telephone Encounter (Signed)
MTG-015 - Tissue and Bodily Fluids: Translational Medicine: Discovery and Evaluation of Biomarkers/Pharmacogenomics for the Diagnosis and Personalized Management of Patients   Called patient to f/u on interest in study.  Pt declined to participate as she does not want to come to the cancer center again for additional appts before her surgery.  Pt denies any questions/concerns at this time.  Wells Guiles 'Learta CoddingNeysa Bonito, RN, BSN Clinical Research Nurse I 04/06/22 2:38 PM

## 2022-04-09 ENCOUNTER — Encounter: Payer: Self-pay | Admitting: *Deleted

## 2022-04-09 ENCOUNTER — Telehealth: Payer: Self-pay | Admitting: *Deleted

## 2022-04-09 NOTE — Telephone Encounter (Signed)
Spoke to pt concerning [place] from 04/01/22. Denies questions or concerns regarding dx or treatment care plan. Encourage pt to call with needs. Received verbal understanding

## 2022-04-10 ENCOUNTER — Telehealth: Payer: Self-pay | Admitting: Licensed Clinical Social Worker

## 2022-04-10 NOTE — Telephone Encounter (Signed)
[facility] CSW Progress Note  Clinical Social Worker  attempted to contact pt by phone  to follow-up on coping after [place]. No answer. Left VM with direct contact information.    Leomar Westberg E Shatera Rennert, LCSW

## 2022-04-13 ENCOUNTER — Other Ambulatory Visit: Payer: Self-pay

## 2022-04-13 ENCOUNTER — Encounter (HOSPITAL_BASED_OUTPATIENT_CLINIC_OR_DEPARTMENT_OTHER): Payer: Self-pay | Admitting: Surgery

## 2022-04-19 ENCOUNTER — Other Ambulatory Visit: Payer: Self-pay | Admitting: Family Medicine

## 2022-04-20 ENCOUNTER — Ambulatory Visit
Admission: RE | Admit: 2022-04-20 | Discharge: 2022-04-20 | Disposition: A | Discharge status: Home / Self Care | Payer: 59 | Source: Ambulatory Visit | Attending: Surgery | Admitting: Surgery

## 2022-04-20 DIAGNOSIS — D0512 Intraductal carcinoma in situ of left breast: Secondary | ICD-10-CM

## 2022-04-20 NOTE — Progress Notes (Signed)

## 2022-04-21 ENCOUNTER — Other Ambulatory Visit: Payer: Self-pay

## 2022-04-21 ENCOUNTER — Ambulatory Visit (HOSPITAL_BASED_OUTPATIENT_CLINIC_OR_DEPARTMENT_OTHER)
Admission: RE | Admit: 2022-04-21 | Discharge: 2022-04-21 | Disposition: A | Discharge status: Home / Self Care | Payer: 59 | Attending: Surgery | Admitting: Surgery

## 2022-04-21 ENCOUNTER — Ambulatory Visit (HOSPITAL_BASED_OUTPATIENT_CLINIC_OR_DEPARTMENT_OTHER): Payer: 59 | Admitting: Certified Registered"

## 2022-04-21 ENCOUNTER — Ambulatory Visit
Admission: RE | Admit: 2022-04-21 | Discharge: 2022-04-21 | Disposition: A | Discharge status: Home / Self Care | Payer: 59 | Source: Ambulatory Visit | Attending: Surgery | Admitting: Surgery

## 2022-04-21 ENCOUNTER — Encounter (HOSPITAL_BASED_OUTPATIENT_CLINIC_OR_DEPARTMENT_OTHER): Payer: Self-pay | Admitting: Surgery

## 2022-04-21 ENCOUNTER — Encounter: Payer: Self-pay | Admitting: Family Medicine

## 2022-04-21 ENCOUNTER — Encounter (HOSPITAL_BASED_OUTPATIENT_CLINIC_OR_DEPARTMENT_OTHER)
Admission: RE | Disposition: A | Discharge status: Home / Self Care | Payer: Self-pay | Source: Home / Self Care | Attending: Surgery

## 2022-04-21 DIAGNOSIS — G4730 Sleep apnea, unspecified: Secondary | ICD-10-CM | POA: Insufficient documentation

## 2022-04-21 DIAGNOSIS — Z87891 Personal history of nicotine dependence: Secondary | ICD-10-CM | POA: Insufficient documentation

## 2022-04-21 DIAGNOSIS — D0512 Intraductal carcinoma in situ of left breast: Secondary | ICD-10-CM | POA: Insufficient documentation

## 2022-04-21 DIAGNOSIS — Z170 Estrogen receptor positive status [ER+]: Secondary | ICD-10-CM

## 2022-04-21 DIAGNOSIS — J45909 Unspecified asthma, uncomplicated: Secondary | ICD-10-CM | POA: Insufficient documentation

## 2022-04-21 DIAGNOSIS — Z803 Family history of malignant neoplasm of breast: Secondary | ICD-10-CM | POA: Insufficient documentation

## 2022-04-21 DIAGNOSIS — I10 Essential (primary) hypertension: Secondary | ICD-10-CM | POA: Diagnosis not present

## 2022-04-21 DIAGNOSIS — Z01818 Encounter for other preprocedural examination: Secondary | ICD-10-CM

## 2022-04-21 HISTORY — PX: BREAST LUMPECTOMY WITH RADIOACTIVE SEED LOCALIZATION: SHX6424

## 2022-04-21 SURGERY — BREAST LUMPECTOMY WITH RADIOACTIVE SEED LOCALIZATION
Anesthesia: General | Site: Breast | Laterality: Left

## 2022-04-21 MED ORDER — MIDAZOLAM HCL 2 MG/2ML IJ SOLN
INTRAMUSCULAR | Status: AC
  Filled 2022-04-21: qty 2

## 2022-04-21 MED ORDER — LIDOCAINE 2% (20 MG/ML) 5 ML SYRINGE
INTRAMUSCULAR | Status: AC
  Filled 2022-04-21: qty 5

## 2022-04-21 MED ORDER — LIDOCAINE HCL (CARDIAC) PF 100 MG/5ML IV SOSY
PREFILLED_SYRINGE | INTRAVENOUS | Status: DC | PRN
  Administered 2022-04-21: 100 mg via INTRAVENOUS

## 2022-04-21 MED ORDER — OXYCODONE HCL 5 MG PO TABS
ORAL_TABLET | ORAL | Status: AC
  Filled 2022-04-21: qty 1

## 2022-04-21 MED ORDER — FENTANYL CITRATE (PF) 100 MCG/2ML IJ SOLN
INTRAMUSCULAR | Status: AC
  Filled 2022-04-21: qty 2

## 2022-04-21 MED ORDER — OXYCODONE HCL 5 MG/5ML PO SOLN: 5.0000 mg | Freq: Once | ORAL | Status: AC | PRN

## 2022-04-21 MED ORDER — ACETAMINOPHEN 500 MG PO TABS: 1000.0000 mg | ORAL_TABLET | ORAL | Status: AC

## 2022-04-21 MED ORDER — ACETAMINOPHEN 500 MG PO TABS
ORAL_TABLET | ORAL | Status: AC
  Filled 2022-04-21: qty 2

## 2022-04-21 MED ORDER — DEXAMETHASONE SODIUM PHOSPHATE 10 MG/ML IJ SOLN
INTRAMUSCULAR | Status: AC
  Filled 2022-04-21: qty 1

## 2022-04-21 MED ORDER — LACTATED RINGERS IV SOLN: INTRAVENOUS | Status: DC

## 2022-04-21 MED ORDER — MAGTRACE LYMPHATIC TRACER
INTRAMUSCULAR | Status: DC | PRN
  Administered 2022-04-21: 2 mL via INTRAMUSCULAR

## 2022-04-21 MED ORDER — CHLORHEXIDINE GLUCONATE CLOTH 2 % EX PADS: 6.0000 | MEDICATED_PAD | Freq: Once | CUTANEOUS | Status: DC

## 2022-04-21 MED ORDER — OXYCODONE HCL 5 MG PO TABS
5.0000 mg | ORAL_TABLET | Freq: Once | ORAL | Status: AC | PRN
  Administered 2022-04-21: 5 mg via ORAL

## 2022-04-21 MED ORDER — BUPIVACAINE-EPINEPHRINE 0.25% -1:200000 IJ SOLN
INTRAMUSCULAR | Status: DC | PRN
  Administered 2022-04-21: 10 mL

## 2022-04-21 MED ORDER — FENTANYL CITRATE (PF) 100 MCG/2ML IJ SOLN
25.0000 ug | INTRAMUSCULAR | Status: DC | PRN
  Administered 2022-04-21: 50 ug via INTRAVENOUS
  Administered 2022-04-21: 25 ug via INTRAVENOUS

## 2022-04-21 MED ORDER — MIDAZOLAM HCL 5 MG/5ML IJ SOLN
INTRAMUSCULAR | Status: DC | PRN
  Administered 2022-04-21: 2 mg via INTRAVENOUS

## 2022-04-21 MED ORDER — PHENYLEPHRINE HCL (PRESSORS) 10 MG/ML IV SOLN
INTRAVENOUS | Status: DC | PRN
  Administered 2022-04-21: 80 ug via INTRAVENOUS

## 2022-04-21 MED ORDER — ONDANSETRON HCL 4 MG/2ML IJ SOLN: 4.0000 mg | Freq: Once | INTRAMUSCULAR | Status: DC | PRN

## 2022-04-21 MED ORDER — DEXAMETHASONE SODIUM PHOSPHATE 10 MG/ML IJ SOLN
INTRAMUSCULAR | Status: DC | PRN
  Administered 2022-04-21: 5 mg via INTRAVENOUS

## 2022-04-21 MED ORDER — PROPOFOL 10 MG/ML IV BOLUS
INTRAVENOUS | Status: DC | PRN
  Administered 2022-04-21: 150 mg via INTRAVENOUS

## 2022-04-21 MED ORDER — ONDANSETRON HCL 4 MG/2ML IJ SOLN
INTRAMUSCULAR | Status: DC | PRN
  Administered 2022-04-21: 4 mg via INTRAVENOUS

## 2022-04-21 MED ORDER — CEFAZOLIN SODIUM-DEXTROSE 2-4 GM/100ML-% IV SOLN
INTRAVENOUS | Status: AC
  Filled 2022-04-21: qty 100

## 2022-04-21 MED ORDER — 0.9 % SODIUM CHLORIDE (POUR BTL) OPTIME
TOPICAL | Status: DC | PRN
  Administered 2022-04-21: 120 mL

## 2022-04-21 MED ORDER — ACETAMINOPHEN 500 MG PO TABS
1000.0000 mg | ORAL_TABLET | Freq: Once | ORAL | Status: AC
  Administered 2022-04-21: 1000 mg via ORAL

## 2022-04-21 MED ORDER — CEFAZOLIN SODIUM-DEXTROSE 2-4 GM/100ML-% IV SOLN
2.0000 g | INTRAVENOUS | Status: AC
  Administered 2022-04-21: 2 g via INTRAVENOUS

## 2022-04-21 MED ORDER — FENTANYL CITRATE (PF) 100 MCG/2ML IJ SOLN
INTRAMUSCULAR | Status: DC | PRN
  Administered 2022-04-21 (×2): 25 ug via INTRAVENOUS

## 2022-04-21 MED ORDER — AMISULPRIDE (ANTIEMETIC) 5 MG/2ML IV SOLN: 10.0000 mg | Freq: Once | INTRAVENOUS | Status: DC | PRN

## 2022-04-21 SURGICAL SUPPLY — 58 items
APL PRP STRL LF DISP 70% ISPRP (MISCELLANEOUS) ×1
APL SKNCLS STERI-STRIP NONHPOA (GAUZE/BANDAGES/DRESSINGS) ×1
APPLIER CLIP 9.375 MED OPEN (MISCELLANEOUS) ×1
APR CLP MED 9.3 20 MLT OPN (MISCELLANEOUS) ×1
BENZOIN TINCTURE PRP APPL 2/3 (GAUZE/BANDAGES/DRESSINGS) ×1 IMPLANT
BLADE HEX COATED 2.75 (ELECTRODE) ×1 IMPLANT
BLADE SURG 15 STRL LF DISP TIS (BLADE) ×1 IMPLANT
BLADE SURG 15 STRL SS (BLADE) ×1
CANISTER SUC SOCK COL 7IN (MISCELLANEOUS) IMPLANT
CANISTER SUCT 1200ML W/VALVE (MISCELLANEOUS) IMPLANT
CHLORAPREP W/TINT 26 (MISCELLANEOUS) ×1 IMPLANT
CLIP APPLIE 9.375 MED OPEN (MISCELLANEOUS) ×1 IMPLANT
COVER BACK TABLE 60X90IN (DRAPES) ×1 IMPLANT
COVER MAYO STAND STRL (DRAPES) ×1 IMPLANT
COVER PROBE W GEL 5X96 (DRAPES) ×1 IMPLANT
DRAPE LAPAROTOMY 100X72 PEDS (DRAPES) ×1 IMPLANT
DRAPE UTILITY XL STRL (DRAPES) ×1 IMPLANT
DRSG TEGADERM 4X4.75 (GAUZE/BANDAGES/DRESSINGS) ×1 IMPLANT
ELECT REM PT RETURN 9FT ADLT (ELECTROSURGICAL) ×1
ELECTRODE REM PT RTRN 9FT ADLT (ELECTROSURGICAL) ×1 IMPLANT
GAUZE SPONGE 4X4 12PLY STRL LF (GAUZE/BANDAGES/DRESSINGS) IMPLANT
GLOVE BIO SURGEON STRL SZ7 (GLOVE) ×1 IMPLANT
GLOVE BIO SURGEON STRL SZ8 (GLOVE) IMPLANT
GLOVE BIOGEL PI IND STRL 7.0 (GLOVE) IMPLANT
GLOVE BIOGEL PI IND STRL 7.5 (GLOVE) ×1 IMPLANT
GLOVE BIOGEL PI IND STRL 8 (GLOVE) IMPLANT
GLOVE SURG SS PI 7.0 STRL IVOR (GLOVE) IMPLANT
GOWN STRL REUS W/ TWL LRG LVL3 (GOWN DISPOSABLE) ×2 IMPLANT
GOWN STRL REUS W/ TWL XL LVL3 (GOWN DISPOSABLE) IMPLANT
GOWN STRL REUS W/TWL LRG LVL3 (GOWN DISPOSABLE) ×2
GOWN STRL REUS W/TWL XL LVL3 (GOWN DISPOSABLE) ×1
ILLUMINATOR WAVEGUIDE N/F (MISCELLANEOUS) IMPLANT
KIT MARKER MARGIN INK (KITS) ×1 IMPLANT
LIGHT WAVEGUIDE WIDE FLAT (MISCELLANEOUS) IMPLANT
NDL 18GX1X1/2 (RX/OR ONLY) (NEEDLE) IMPLANT
NDL HYPO 25X1 1.5 SAFETY (NEEDLE) ×1 IMPLANT
NEEDLE 18GX1X1/2 (RX/OR ONLY) (NEEDLE) ×1 IMPLANT
NEEDLE HYPO 25X1 1.5 SAFETY (NEEDLE) ×2 IMPLANT
NS IRRIG 1000ML POUR BTL (IV SOLUTION) ×1 IMPLANT
PACK BASIN DAY SURGERY FS (CUSTOM PROCEDURE TRAY) ×1 IMPLANT
PENCIL SMOKE EVACUATOR (MISCELLANEOUS) ×1 IMPLANT
SLEEVE SCD COMPRESS KNEE MED (STOCKING) ×1 IMPLANT
SPIKE FLUID TRANSFER (MISCELLANEOUS) IMPLANT
SPONGE GAUZE 2X2 8PLY STRL LF (GAUZE/BANDAGES/DRESSINGS) IMPLANT
SPONGE T-LAP 18X18 ~~LOC~~+RFID (SPONGE) IMPLANT
SPONGE T-LAP 4X18 ~~LOC~~+RFID (SPONGE) ×1 IMPLANT
STRIP CLOSURE SKIN 1/2X4 (GAUZE/BANDAGES/DRESSINGS) ×1 IMPLANT
SUT MON AB 4-0 PC3 18 (SUTURE) ×1 IMPLANT
SUT SILK 2 0 SH (SUTURE) IMPLANT
SUT VIC AB 3-0 SH 27 (SUTURE) ×1
SUT VIC AB 3-0 SH 27X BRD (SUTURE) ×1 IMPLANT
SYR BULB EAR ULCER 3OZ GRN STR (SYRINGE) IMPLANT
SYR CONTROL 10ML LL (SYRINGE) ×1 IMPLANT
TOWEL GREEN STERILE FF (TOWEL DISPOSABLE) ×1 IMPLANT
TRACER MAGTRACE VIAL (MISCELLANEOUS) IMPLANT
TRAY FAXITRON CT DISP (TRAY / TRAY PROCEDURE) ×1 IMPLANT
TUBE CONNECTING 20X1/4 (TUBING) IMPLANT
YANKAUER SUCT BULB TIP NO VENT (SUCTIONS) IMPLANT

## 2022-04-21 NOTE — Op Note (Signed)
Pre-op Diagnosis:  Left breast ductal carcinoma in situ Post-op Diagnosis: same Procedure:  Left breast radioactive seed localized lumpectomy Surgeon:  Yola Paradiso K. Anesthesia:  GEN - LMA Indications:  This is a 66 year old female in good health with a family history of breast cancer in her mother (diagnosed at age 63) as well as her daughter (diagnosed at age 76) who presents after recent screening mammogram.  This revealed a 7 x 3 x 8 mm area in the left upper inner quadrant located at 1130, 2 cm from the nipple.  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  ER/PR positive.  The patient is referred to breast [place] for her initial evaluation.  No previous breast problems or breast surgery.  We recommended lumpectomy under radioactive seed localization.  Description of procedure: The patient is brought to the operating room placed in supine position on the operating room table. After an adequate level of general anesthesia was obtained, her left breast was prepped with ChloraPrep and draped in sterile fashion. A timeout was taken to ensure the proper patient and proper procedure. We interrogated the breast with the neoprobe. We made a circumareolar incision around the upper side of the nipple after infiltrating with 0.25% Marcaine. Dissection was carried down in the breast tissue with cautery. We used the neoprobe to guide [place] towards the radioactive seed. We excised an area of tissue around the radioactive seed 1.5 cm in diameter. The specimen was removed and was oriented with a paint kit. Specimen mammogram showed the radioactive seed as well as the biopsy clip within the specimen. This was sent for pathologic examination. There is no residual radioactivity within the biopsy cavity. We inspected carefully for hemostasis. The wound was thoroughly irrigated. The wound was closed with a deep layer of 3-0 Vicryl and a subcuticular layer of 4-0  Monocryl. Benzoin Steri-Strips were applied. The patient was then extubated and brought to the recovery room in stable condition. All sponge, instrument, and needle counts are correct.  Imogene Burn. Georgette Dover, MD, [facility] Surgery  General/ Trauma Surgery  04/21/2022 1:09 PM

## 2022-04-21 NOTE — Anesthesia Postprocedure Evaluation (Signed)
Anesthesia Post Note  Patient: Rebecca [name]  Procedure(s) Performed: LEFT BREAST LUMPECTOMY WITH RADIOACTIVE SEED LOCALIZATION (Left: Breast)     Patient location during evaluation: PACU Anesthesia Type: General Level of consciousness: awake and alert Pain management: pain level controlled Vital Signs Assessment: post-procedure vital signs reviewed and stable Respiratory status: spontaneous breathing, nonlabored ventilation and respiratory function stable Cardiovascular status: blood pressure returned to baseline and stable Postop Assessment: no apparent nausea or vomiting Anesthetic complications: no   No notable events documented.  Last Vitals:  Vitals:   04/21/22 1416 04/21/22 1431  BP: 127/70 (!) 140/94  Pulse: 78 76  Resp: 13 20  Temp:  36.9 C  SpO2: 92% 96%    Last Pain:  Vitals:   04/21/22 1446  TempSrc:   PainSc: 3                  Lidia Collum

## 2022-04-21 NOTE — Transfer of Care (Signed)
Immediate Anesthesia Transfer of Care Note  Patient: Rebecca [name]  Procedure(s) Performed: LEFT BREAST LUMPECTOMY WITH RADIOACTIVE SEED LOCALIZATION (Left: Breast)  Patient Location: PACU  Anesthesia Type:General  Level of Consciousness: drowsy  Airway & Oxygen Therapy: Patient Spontanous Breathing and Patient connected to face mask oxygen  Post-op Assessment: Report given to RN and Post -op Vital signs reviewed and stable  Post vital signs: Reviewed and stable  Last Vitals:  Vitals Value Taken Time  BP 127/71 04/21/22 1309  Temp    Pulse 81 04/21/22 1311  Resp 21 04/21/22 1311  SpO2 99 % 04/21/22 1311  Vitals shown include unvalidated device data.  Last Pain:  Vitals:   04/21/22 1103  TempSrc: Oral  PainSc: 0-No pain         Complications: No notable events documented.

## 2022-04-21 NOTE — Interval H&P Note (Signed)
History and Physical Interval Note:  04/21/2022 11:54 AM  [name]  has presented today for surgery, with the diagnosis of LEFT BREAST DCIS.  The various methods of treatment have been discussed with the patient and family. After consideration of risks, benefits and other options for treatment, the patient has consented to  Procedure(s): LEFT BREAST LUMPECTOMY WITH RADIOACTIVE SEED LOCALIZATION (Left) as a surgical intervention.  The patient's history has been reviewed, patient examined, no change in status, stable for surgery.  I have reviewed the patient's chart and labs.  Questions were answered to the patient's satisfaction.     Maia Petties

## 2022-04-21 NOTE — Anesthesia Procedure Notes (Signed)
Procedure Name: LMA Insertion Date/Time: 04/21/2022 12:27 PM  Performed by: Lavonia Dana, CRNAPre-anesthesia Checklist: Patient identified, Emergency Drugs available, Suction available and Patient being monitored Patient Re-evaluated:Patient Re-evaluated prior to induction Oxygen Delivery Method: Circle system utilized Preoxygenation: Pre-oxygenation with 100% oxygen Induction Type: IV induction Ventilation: Mask ventilation without difficulty LMA: LMA inserted LMA Size: 4.0 Number of attempts: 1 Airway Equipment and Method: Bite block Placement Confirmation: positive ETCO2 Tube secured with: Tape Dental Injury: Teeth and Oropharynx as per pre-operative assessment

## 2022-04-21 NOTE — Discharge Instructions (Addendum)
[place] Office Phone Number [phone]  BREAST BIOPSY/ PARTIAL MASTECTOMY: POST OP INSTRUCTIONS  Always review your discharge instruction sheet given to you by the facility where your surgery was performed.  IF YOU HAVE DISABILITY OR FAMILY LEAVE FORMS, YOU MUST BRING THEM TO THE OFFICE FOR PROCESSING.  DO NOT GIVE THEM TO YOUR DOCTOR.  A prescription for pain medication may be given to you upon discharge.  Take your pain medication as prescribed, if needed.  If narcotic pain medicine is not needed, then you may take acetaminophen (Tylenol) or ibuprofen (Advil) as needed. Take your usually prescribed medications unless otherwise directed If you need a refill on your pain medication, please contact your pharmacy.  They will contact our office to request authorization.  Prescriptions will not be filled after 5pm or on week-ends. You should eat very light the first 24 hours after surgery, such as soup, crackers, pudding, etc.  Resume your normal diet the day after surgery. Most patients will experience some swelling and bruising in the breast.  Ice packs and a good support bra will help.  Swelling and bruising can take several days to resolve.  It is common to experience some constipation if taking pain medication after surgery.  Increasing fluid intake and taking a stool softener will usually help or prevent this problem from occurring.  A mild laxative (Milk of Magnesia or Miralax) should be taken according to package directions if there are no bowel movements after 48 hours. Unless discharge instructions indicate otherwise, you may remove your bandages 24-48 hours after surgery, and you may shower at that time.  You may have steri-strips (small skin tapes) in place directly over the incision.  These strips should be left on the skin for 7-10 days.  If your surgeon used skin glue on the incision, you may shower in 24 hours.  The glue will flake off over the next 2-3 weeks.  Any  sutures or staples will be removed at the office during your follow-up visit. ACTIVITIES:  You may resume regular daily activities (gradually increasing) beginning the next day.  Wearing a good support bra or sports bra minimizes pain and swelling.  You may have sexual intercourse when it is comfortable. You may drive when you no longer are taking prescription pain medication, you can comfortably wear a seatbelt, and you can safely maneuver your car and apply brakes. RETURN TO WORK:  ______________________________________________________________________________________ Dennis Bast should see your doctor in the office for a follow-up appointment approximately two weeks after your surgery.  Your doctor's nurse will typically make your follow-up appointment when she calls you with your pathology report.  Expect your pathology report 2-3 business days after your surgery.  You may call to check if you do not hear from [place] after three days. OTHER INSTRUCTIONS: _______________________________________________________________________________________________ _____________________________________________________________________________________________________________________________________ _____________________________________________________________________________________________________________________________________ _____________________________________________________________________________________________________________________________________  WHEN TO CALL YOUR DOCTOR: Fever over 101.0 Nausea and/or vomiting. Extreme swelling or bruising. Continued bleeding from incision. Increased pain, redness, or drainage from the incision.  The clinic staff is available to answer your questions during regular business hours.  Please don't hesitate to call and ask to speak to one of the nurses for clinical concerns.  If you have a medical emergency, go to the nearest emergency room or call 911.  A surgeon from [facility] is always on call at the hospital.  For further questions, please visit [email or web address]   Post Anesthesia Home Care Instructions  Activity: Get plenty of rest for the remainder of the  day. A responsible individual must stay with you for 24 hours following the procedure.  For the next 24 hours, DO NOT: -Drive a car -Paediatric nurse -Drink alcoholic beverages -Take any medication unless instructed by your physician -Make any legal decisions or sign important papers.  Meals: Start with liquid foods such as gelatin or soup. Progress to regular foods as tolerated. Avoid greasy, spicy, heavy foods. If nausea and/or vomiting occur, drink only clear liquids until the nausea and/or vomiting subsides. Call your physician if vomiting continues.  Special Instructions/Symptoms: Your throat may feel dry or sore from the anesthesia or the breathing tube placed in your throat during surgery. If this causes discomfort, gargle with warm salt water. The discomfort should disappear within 24 hours.  If you had a scopolamine patch placed behind your ear for the management of post- operative nausea and/or vomiting:  1. The medication in the patch is effective for 72 hours, after which it should be removed.  Wrap patch in a tissue and discard in the trash. Wash hands thoroughly with soap and water. 2. You may remove the patch earlier than 72 hours if you experience unpleasant side effects which may include dry mouth, dizziness or visual disturbances. 3. Avoid touching the patch. Wash your hands with soap and water after contact with the patch.     Next dose of Tylenol can be given at 5:00pm if needed.   Post Anesthesia Home Care Instructions  Activity: Get plenty of rest for the remainder of the day. A responsible individual must stay with you for 24 hours following the procedure.  For the next 24 hours, DO NOT: -Drive a car -Paediatric nurse -Drink alcoholic  beverages -Take any medication unless instructed by your physician -Make any legal decisions or sign important papers.  Meals: Start with liquid foods such as gelatin or soup. Progress to regular foods as tolerated. Avoid greasy, spicy, heavy foods. If nausea and/or vomiting occur, drink only clear liquids until the nausea and/or vomiting subsides. Call your physician if vomiting continues.  Special Instructions/Symptoms: Your throat may feel dry or sore from the anesthesia or the breathing tube placed in your throat during surgery. If this causes discomfort, gargle with warm salt water. The discomfort should disappear within 24 hours.  If you had a scopolamine patch placed behind your ear for the management of post- operative nausea and/or vomiting:  1. The medication in the patch is effective for 72 hours, after which it should be removed.  Wrap patch in a tissue and discard in the trash. Wash hands thoroughly with soap and water. 2. You may remove the patch earlier than 72 hours if you experience unpleasant side effects which may include dry mouth, dizziness or visual disturbances. 3. Avoid touching the patch. Wash your hands with soap and water after contact with the patch.

## 2022-04-21 NOTE — Anesthesia Preprocedure Evaluation (Signed)
Anesthesia Evaluation  Patient identified by MRN, date of birth, ID band Patient awake    Reviewed: Allergy & Precautions, NPO status , Patient's Chart, lab work & pertinent test results  History of Anesthesia Complications Negative for: history of anesthetic complications  Airway Mallampati: II  TM Distance: >3 FB Neck ROM: Full    Dental  (+) Dental Advisory Given, Teeth Intact   Pulmonary asthma , sleep apnea and Continuous Positive Airway Pressure Ventilation , former smoker   Pulmonary exam normal        Cardiovascular hypertension, Pt. on medications Normal cardiovascular exam     Neuro/Psych  Headaches    GI/Hepatic Neg liver ROS,GERD  ,,  Endo/Other  negative endocrine ROS    Renal/GU negative Renal ROS  negative genitourinary   Musculoskeletal negative musculoskeletal ROS (+)    Abdominal   Peds  Hematology negative hematology ROS (+)   Anesthesia Other Findings Left breast DCIS  Reproductive/Obstetrics                             Anesthesia Physical Anesthesia Plan  ASA: 2  Anesthesia Plan: General   Post-op Pain Management: Tylenol PO (pre-op)* and Toradol IV (intra-op)*   Induction: Intravenous  PONV Risk Score and Plan: 3 and Ondansetron, Dexamethasone, Midazolam and Treatment may vary due to age or medical condition  Airway Management Planned: LMA  Additional Equipment: None  Intra-op Plan:   Post-operative Plan: Extubation in OR  Informed Consent: I have reviewed the patients History and Physical, chart, labs and discussed the procedure including the risks, benefits and alternatives for the proposed anesthesia with the patient or authorized representative who has indicated his/her understanding and acceptance.     Dental advisory given  Plan Discussed with:   Anesthesia Plan Comments:        Anesthesia Quick Evaluation

## 2022-04-22 ENCOUNTER — Encounter (HOSPITAL_BASED_OUTPATIENT_CLINIC_OR_DEPARTMENT_OTHER): Payer: Self-pay | Admitting: Surgery

## 2022-04-22 LAB — SURGICAL PATHOLOGY

## 2022-04-22 NOTE — Progress Notes (Signed)
Left message stating courtesy call and if any questions or concerns please call the doctors office.  

## 2022-04-23 ENCOUNTER — Ambulatory Visit: Payer: Self-pay | Admitting: Surgery

## 2022-04-24 ENCOUNTER — Encounter: Payer: Self-pay | Admitting: *Deleted

## 2022-04-29 ENCOUNTER — Encounter (HOSPITAL_BASED_OUTPATIENT_CLINIC_OR_DEPARTMENT_OTHER): Payer: Self-pay | Admitting: Surgery

## 2022-04-29 ENCOUNTER — Other Ambulatory Visit: Payer: Self-pay

## 2022-05-01 NOTE — Progress Notes (Signed)

## 2022-05-04 ENCOUNTER — Other Ambulatory Visit: Payer: Self-pay

## 2022-05-04 ENCOUNTER — Inpatient Hospital Stay: Payer: 59 | Attending: Hematology and Oncology | Admitting: Hematology and Oncology

## 2022-05-04 VITALS — BP 158/80 | HR 83 | Temp 97.7°F | Resp 16 | Ht 63.0 in | Wt 196.7 lb

## 2022-05-04 DIAGNOSIS — Z79899 Other long term (current) drug therapy: Secondary | ICD-10-CM | POA: Diagnosis not present

## 2022-05-04 DIAGNOSIS — Z87891 Personal history of nicotine dependence: Secondary | ICD-10-CM | POA: Insufficient documentation

## 2022-05-04 DIAGNOSIS — D0512 Intraductal carcinoma in situ of left breast: Secondary | ICD-10-CM | POA: Diagnosis present

## 2022-05-04 NOTE — Progress Notes (Signed)
[place] NOTE  Patient Care Team: Billie Ruddy, MD as PCP - General (Family Medicine) Himmelrich, Bryson Ha, RD (Inactive) as Dietitian Molli Posey, MD as Attending Physician (Obstetrics and Gynecology) Gwenette Greet, Armando Reichert, MD as Attending Physician (Pulmonary Disease) Gean Quint, MD (Inactive) as Attending Physician (Allergy) Rockwell Germany, RN as Oncology Nurse Navigator Mauro Kaufmann, RN as Oncology Nurse Navigator Donnie Mesa, MD as Consulting Physician (General Surgery) Benay Pike, MD as Consulting Physician (Hematology and Oncology) Gery Pray, MD as Consulting Physician (Radiation Oncology)  CHIEF COMPLAINTS/PURPOSE OF CONSULTATION:  DCIS  HISTORY OF PRESENTING ILLNESS:   Rebecca [name] 66 y.o. female is here because of recent diagnosis of left DCIS.  SUMMARY OF ONCOLOGIC HISTORY: Oncology History  Ductal carcinoma in situ (DCIS) of left breast  03/17/2022 Mammogram   New solid left breast mass identified at 11:30, 2 cm from the nipple. Targeted ultrasound is performed, showing a mass at 11:30, 2 cm from the nipple measuring 7 x 3 x 8 mm, solid in appearance. No axillary adenopathy.   03/27/2022 Initial Diagnosis   Ductal carcinoma in situ (DCIS) of left breast    Pathology Results   Pathology from left breast showed DCIS, intermediate to high nuclear grade, solid and cribriform types with focal necrosis.  Negative for invasive carcinoma.  Negative for microcalcifications.  Prognostics showed ER 95% positive strong staining PR 40% positive strong staining.    Since last visit patient underwent left lumpectomy, final pathology showed DCIS, intermediate to high-grade, solid and cribriform types with necrosis, negative for invasive carcinoma.  DCIS abuts posterior margin. She today complains of spasms in the area of surgery. No pain otherwise. She tells me that she will have to go through another surgery given close margin. Her  husband is with her today. Rest of the pertinent 10 point ROS reviewed and negative.  MEDICAL HISTORY:  Past Medical History:  Diagnosis Date   Arthritis    Asthma    Blindness, legal    L EYE   Breast cancer ([place])    GERD (gastroesophageal reflux disease)    Hearing loss    History of kidney stones    Hyperlipidemia    Hypertension    Lapband May 2013 11/18/2011   Migraines    Obesity, Class III, BMI 40-49.9 (morbid obesity) ([place]) 06/03/2011   Pre-diabetes    Sleep apnea    uses c-pap    SURGICAL HISTORY: Past Surgical History:  Procedure Laterality Date   ABDOMINAL HYSTERECTOMY  2006   fibroids   BREAST LUMPECTOMY WITH RADIOACTIVE SEED LOCALIZATION Left 04/21/2022   Procedure: LEFT BREAST LUMPECTOMY WITH RADIOACTIVE SEED LOCALIZATION;  Surgeon: Donnie Mesa, MD;  Location: [facility];  Service: General;  Laterality: Left;   BREATH TEK H PYLORI  08/17/2011   Procedure: BREATH TEK H PYLORI;  Surgeon: Pedro Earls, MD;  Location: [facility];  Service: General;  Laterality: N/A;  PATIENT WILL COME AT 0715   COLONOSCOPY  2008   @ [place]     Patient unsure of surgery date. Left eye   KNEE SURGERY  1992   right   LAPAROSCOPIC GASTRIC BANDING  10/20/2011   Procedure: LAPAROSCOPIC GASTRIC BANDING;  Surgeon: Pedro Earls, MD;  Location: [facility];  Service: General;  Laterality: N/A;   TOTAL KNEE ARTHROPLASTY Right 08/18/2021   Procedure: TOTAL KNEE ARTHROPLASTY;  Surgeon: Gaynelle Arabian, MD;  Location: [facility];  Service:  Orthopedics;  Laterality: Right;    SOCIAL HISTORY: Social History   Socioeconomic History   Marital status: Married    Spouse name: Not on file   Number of children: Not on file   Years of education: Not on file   Highest education level: Not on file  Occupational History   Not on file  Tobacco Use   Smoking status: Former    Packs/day: 0.50    Years: 15.00    Total pack years: 7.50    Types: Cigarettes    Quit  date: 06/02/1994    Years since quitting: 27.9   Smokeless tobacco: Never  Vaping Use   Vaping Use: Never used  Substance and Sexual Activity   Alcohol use: No   Drug use: No   Sexual activity: Not on file    Comment: hyst  Other Topics Concern   Not on file  Social History Narrative   Work or School: works part time with [employer] - 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       Home Situation: lives with husband      Spiritual Beliefs: Christian  - Education administrator, Dentist      Lifestyle: wt watchers 2019, starting to exercise         Social Determinants of Health   Financial Resource Strain: [place]  (04/01/2022)   Overall Financial Resource Strain (CARDIA)    Difficulty of Paying Living Expenses: Not very hard  Food Insecurity: No Food Insecurity (04/01/2022)   Hunger Vital Sign    Worried About Running Out of Food in the Last Year: Never true    [place] in the Last Year: Never true  Transportation Needs: No Transportation Needs (04/01/2022)   PRAPARE - Hydrologist (Medical): No    Lack of Transportation (Non-Medical): No  Physical Activity: Inactive (10/06/2021)   Exercise Vital Sign    Days of Exercise per Week: 0 days    Minutes of Exercise per Session: 0 min  Stress: No Stress Concern Present (10/06/2021)   [place]    Feeling of Stress : Not at all  Social Connections: Not on file  Intimate Partner Violence: Not on file    FAMILY HISTORY: Family History  Problem Relation Age of Onset   Breast cancer Mother 35   Alcohol abuse Father    Heart disease Father 57       MI   Hypertension Father    Colon cancer Brother        dx after 64   Breast cancer Daughter 72    ALLERGIES:  is allergic to hydrocodone and sulfa antibiotics.  MEDICATIONS:  Current Outpatient Medications  Medication Sig Dispense Refill   albuterol (PROVENTIL)  (2.5 MG/3ML) 0.083% nebulizer solution Take 3 mLs (2.5 mg total) by nebulization every 4 (four) hours as needed for wheezing or shortness of breath. 75 mL 1   albuterol (VENTOLIN HFA) 108 (90 Base) MCG/ACT inhaler Use 2 puffs every 4 hours as needed for cough or wheeze.  May use  2 puffs 10-20 minutes prior to exercise. 18 g 1   ALVESCO 160 MCG/ACT inhaler Inhale 1 puff into the lungs 2 (two) times daily. (Patient not taking: Reported on 04/03/2022) 1 each 2   cholecalciferol (VITAMIN D3) 25 MCG (1000 UNIT) tablet Take 1,000 Units by mouth daily.     hydrochlorothiazide (HYDRODIURIL) 12.5 MG tablet TAKE  1 TABLET BY MOUTH EVERY DAY 90 tablet 1   lisinopril (ZESTRIL) 30 MG tablet TAKE 1 TABLET BY MOUTH EVERY DAY 90 tablet 1   montelukast (SINGULAIR) 10 MG tablet TAKE 1 TABLET BY MOUTH EVERYDAY AT BEDTIME 30 tablet 0   Multiple Vitamins-Minerals (MULTIVITAMIN WITH MINERALS) tablet Take 1 tablet by mouth daily with breakfast.     vitamin B-12 (CYANOCOBALAMIN) 100 MCG tablet Take 100 mcg by mouth daily.     No current facility-administered medications for this visit.    REVIEW OF SYSTEMS:   Constitutional: Denies fevers, chills or abnormal night sweats Eyes: Denies blurriness of vision, double vision or watery eyes Ears, nose, mouth, throat, and face: Denies mucositis or sore throat Respiratory: Denies cough, dyspnea or wheezes Cardiovascular: Denies palpitation, chest discomfort or lower extremity swelling Gastrointestinal:  Denies nausea, heartburn or change in bowel habits Skin: Denies abnormal skin rashes Lymphatics: Denies new lymphadenopathy or easy bruising Neurological:Denies numbness, tingling or new weaknesses Behavioral/Psych: Mood is stable, no new changes  Breast: Denies any palpable lumps or discharge All other systems were reviewed with the patient and are negative.  PHYSICAL EXAMINATION: ECOG PERFORMANCE STATUS: 0 - Asymptomatic  Vitals:   05/04/22 0914  BP: (!) 158/80   Pulse: 83  Resp: 16  Temp: 97.7 F (36.5 C)  SpO2: 97%   Filed Weights   05/04/22 0914  Weight: 196 lb 11.2 oz (89.2 kg)   General appearance: alert, oriented, in no acute distress. Left breast: Surgical scar healing well. Post op ecchymosis.  Rest of the pertinent 10 point ROS reviewed and negative  LABORATORY DATA:  I have reviewed the data as listed Lab Results  Component Value Date   WBC 6.8 04/01/2022   HGB 12.9 04/01/2022   HCT 37.6 04/01/2022   MCV 90.8 04/01/2022   PLT 400 04/01/2022   Lab Results  Component Value Date   NA 143 04/01/2022   K 3.4 (L) 04/01/2022   CL 106 04/01/2022   CO2 31 04/01/2022    RADIOGRAPHIC STUDIES: I have personally reviewed the radiological reports and agreed with the findings in the report.  ASSESSMENT AND PLAN:   Ductal carcinoma in situ (DCIS) of left breast This is a very pleasant 66 year old female patient with left breast DCIS intermediate to high nuclear grade ER/PR positive referred to breast [place] for recommendations.   Final pathology reviewed, significant for DCIS, abutting posterior margin.  DCIS is intermediate to high-grade.  She will be going through revision surgery, has not scheduled a date yet.  She also has a follow-up with Dr. Sondra Come for adjuvant radiation.  Following surgery and adjuvant radiation, we have discussed about role of antiestrogen therapy.  I have once again discussed about tamoxifen versus aromatase inhibitors, mechanism of action and adverse effects with each class.  We have discussed about adverse effects from tamoxifen including but not limited to postmenopausal symptoms, DVT/PE, endometrial hyperplasia and cancer, benefit would be improvement in bone density.  At this time she is leaning towards tamoxifen since she is worried about bone density loss.   We will start antiestrogen therapy after she completes adjuvant radiation.  Total time spent: 30 minutes including history, physical exam, review  of records, counseling and coordination of care  All questions were answered. The patient knows to call the clinic with any problems, questions or concerns.    Benay Pike, MD 05/04/22

## 2022-05-04 NOTE — Anesthesia Preprocedure Evaluation (Addendum)
Anesthesia Evaluation  Patient identified by MRN, date of birth, ID band Patient awake    Reviewed: Allergy & Precautions, NPO status , Patient's Chart, lab work & pertinent test results  History of Anesthesia Complications Negative for: history of anesthetic complications  Airway Mallampati: II       Dental  (+) Dental Advisory Given, Teeth Intact   Pulmonary asthma , sleep apnea and Continuous Positive Airway Pressure Ventilation , former smoker   Pulmonary exam normal        Cardiovascular hypertension, Pt. on medications Normal cardiovascular exam     Neuro/Psych  Headaches  negative psych ROS   GI/Hepatic Neg liver ROS,GERD  ,,  Endo/Other  negative endocrine ROS    Renal/GU negative Renal ROS  negative genitourinary   Musculoskeletal negative musculoskeletal ROS (+)    Abdominal  (+) + obese  Peds  Hematology negative hematology ROS (+)   Anesthesia Other Findings   Reproductive/Obstetrics                             Anesthesia Physical Anesthesia Plan  ASA: 2  Anesthesia Plan: General   Post-op Pain Management:    Induction: Intravenous  PONV Risk Score and Plan: 3 and Ondansetron, Dexamethasone, Midazolam and Treatment may vary due to age or medical condition  Airway Management Planned: LMA  Additional Equipment: None  Intra-op Plan:   Post-operative Plan: Extubation in OR  Informed Consent: I have reviewed the patients History and Physical, chart, labs and discussed the procedure including the risks, benefits and alternatives for the proposed anesthesia with the patient or authorized representative who has indicated his/her understanding and acceptance.     Dental advisory given  Plan Discussed with: CRNA  Anesthesia Plan Comments:        Anesthesia Quick Evaluation

## 2022-05-04 NOTE — Progress Notes (Signed)
Reviewed lab results with Dr. Doroteo Glassman, no further labs needed for day of surgery 05/05/22.

## 2022-05-04 NOTE — Assessment & Plan Note (Signed)
This is a very pleasant 66 year old female patient with left breast DCIS intermediate to high nuclear grade ER/PR positive referred to breast [place] for recommendations.   Final pathology reviewed, significant for DCIS, abutting posterior margin.  DCIS is intermediate to high-grade.  She will be going through revision surgery, has not scheduled a date yet.  She also has a follow-up with Dr. Sondra Come for adjuvant radiation.  Following surgery and adjuvant radiation, we have discussed about role of antiestrogen therapy.  I have once again discussed about tamoxifen versus aromatase inhibitors, mechanism of action and adverse effects with each class.  We have discussed about adverse effects from tamoxifen including but not limited to postmenopausal symptoms, DVT/PE, endometrial hyperplasia and cancer, benefit would be improvement in bone density.  At this time she is leaning towards tamoxifen since she is worried about bone density loss.   We will start antiestrogen therapy after she completes adjuvant radiation.

## 2022-05-05 ENCOUNTER — Ambulatory Visit (HOSPITAL_BASED_OUTPATIENT_CLINIC_OR_DEPARTMENT_OTHER): Payer: 59 | Admitting: Anesthesiology

## 2022-05-05 ENCOUNTER — Other Ambulatory Visit: Payer: Self-pay

## 2022-05-05 ENCOUNTER — Encounter (HOSPITAL_BASED_OUTPATIENT_CLINIC_OR_DEPARTMENT_OTHER)
Admission: RE | Disposition: A | Discharge status: Home / Self Care | Payer: Self-pay | Source: Home / Self Care | Attending: Surgery

## 2022-05-05 ENCOUNTER — Ambulatory Visit (HOSPITAL_BASED_OUTPATIENT_CLINIC_OR_DEPARTMENT_OTHER)
Admission: RE | Admit: 2022-05-05 | Discharge: 2022-05-05 | Disposition: A | Discharge status: Home / Self Care | Payer: 59 | Attending: Surgery | Admitting: Surgery

## 2022-05-05 ENCOUNTER — Encounter (HOSPITAL_BASED_OUTPATIENT_CLINIC_OR_DEPARTMENT_OTHER): Payer: Self-pay | Admitting: Surgery

## 2022-05-05 DIAGNOSIS — Z01818 Encounter for other preprocedural examination: Secondary | ICD-10-CM

## 2022-05-05 DIAGNOSIS — M96843 Postprocedural seroma of a musculoskeletal structure following other procedure: Secondary | ICD-10-CM | POA: Insufficient documentation

## 2022-05-05 DIAGNOSIS — Z170 Estrogen receptor positive status [ER+]: Secondary | ICD-10-CM | POA: Insufficient documentation

## 2022-05-05 DIAGNOSIS — D0512 Intraductal carcinoma in situ of left breast: Secondary | ICD-10-CM | POA: Insufficient documentation

## 2022-05-05 DIAGNOSIS — Z79899 Other long term (current) drug therapy: Secondary | ICD-10-CM | POA: Diagnosis not present

## 2022-05-05 DIAGNOSIS — Z87891 Personal history of nicotine dependence: Secondary | ICD-10-CM | POA: Insufficient documentation

## 2022-05-05 DIAGNOSIS — G4733 Obstructive sleep apnea (adult) (pediatric): Secondary | ICD-10-CM | POA: Insufficient documentation

## 2022-05-05 DIAGNOSIS — Z8249 Family history of ischemic heart disease and other diseases of the circulatory system: Secondary | ICD-10-CM | POA: Diagnosis not present

## 2022-05-05 DIAGNOSIS — Z803 Family history of malignant neoplasm of breast: Secondary | ICD-10-CM | POA: Diagnosis not present

## 2022-05-05 DIAGNOSIS — K219 Gastro-esophageal reflux disease without esophagitis: Secondary | ICD-10-CM | POA: Insufficient documentation

## 2022-05-05 DIAGNOSIS — I10 Essential (primary) hypertension: Secondary | ICD-10-CM | POA: Diagnosis not present

## 2022-05-05 DIAGNOSIS — C50912 Malignant neoplasm of unspecified site of left female breast: Secondary | ICD-10-CM | POA: Diagnosis present

## 2022-05-05 DIAGNOSIS — J45909 Unspecified asthma, uncomplicated: Secondary | ICD-10-CM | POA: Insufficient documentation

## 2022-05-05 HISTORY — DX: Dizziness and giddiness: R42

## 2022-05-05 HISTORY — PX: RE-EXCISION OF BREAST LUMPECTOMY: SHX6048

## 2022-05-05 SURGERY — EXCISION, LESION, BREAST
Anesthesia: General | Site: Breast | Laterality: Left

## 2022-05-05 MED ORDER — ACETAMINOPHEN 500 MG PO TABS
1000.0000 mg | ORAL_TABLET | ORAL | Status: AC
  Administered 2022-05-05: 1000 mg via ORAL

## 2022-05-05 MED ORDER — LACTATED RINGERS IV SOLN: INTRAVENOUS | Status: DC

## 2022-05-05 MED ORDER — PHENYLEPHRINE 80 MCG/ML (10ML) SYRINGE FOR IV PUSH (FOR BLOOD PRESSURE SUPPORT)
PREFILLED_SYRINGE | INTRAVENOUS | Status: AC
  Filled 2022-05-05: qty 10

## 2022-05-05 MED ORDER — OXYCODONE HCL 5 MG PO TABS
5.0000 mg | ORAL_TABLET | Freq: Once | ORAL | Status: AC | PRN
  Administered 2022-05-05: 5 mg via ORAL

## 2022-05-05 MED ORDER — CHLORHEXIDINE GLUCONATE CLOTH 2 % EX PADS: 6.0000 | MEDICATED_PAD | Freq: Once | CUTANEOUS | Status: DC

## 2022-05-05 MED ORDER — PROPOFOL 10 MG/ML IV BOLUS
INTRAVENOUS | Status: AC
  Filled 2022-05-05: qty 20

## 2022-05-05 MED ORDER — OXYCODONE HCL 5 MG PO TABS
ORAL_TABLET | ORAL | Status: AC
  Filled 2022-05-05: qty 1

## 2022-05-05 MED ORDER — CEFAZOLIN SODIUM-DEXTROSE 2-4 GM/100ML-% IV SOLN
INTRAVENOUS | Status: AC
  Filled 2022-05-05: qty 100

## 2022-05-05 MED ORDER — LIDOCAINE 2% (20 MG/ML) 5 ML SYRINGE
INTRAMUSCULAR | Status: AC
  Filled 2022-05-05: qty 5

## 2022-05-05 MED ORDER — CEFAZOLIN SODIUM-DEXTROSE 2-4 GM/100ML-% IV SOLN
2.0000 g | INTRAVENOUS | Status: AC
  Administered 2022-05-05: 2 g via INTRAVENOUS

## 2022-05-05 MED ORDER — MIDAZOLAM HCL 5 MG/5ML IJ SOLN
INTRAMUSCULAR | Status: DC | PRN
  Administered 2022-05-05: 1 mg via INTRAVENOUS

## 2022-05-05 MED ORDER — ACETAMINOPHEN 325 MG PO TABS: 325.0000 mg | ORAL_TABLET | ORAL | Status: DC | PRN

## 2022-05-05 MED ORDER — DEXAMETHASONE SODIUM PHOSPHATE 10 MG/ML IJ SOLN
INTRAMUSCULAR | Status: AC
  Filled 2022-05-05: qty 1

## 2022-05-05 MED ORDER — ACETAMINOPHEN 500 MG PO TABS
ORAL_TABLET | ORAL | Status: AC
  Filled 2022-05-05: qty 2

## 2022-05-05 MED ORDER — LIDOCAINE HCL (CARDIAC) PF 100 MG/5ML IV SOSY
PREFILLED_SYRINGE | INTRAVENOUS | Status: DC | PRN
  Administered 2022-05-05: 100 mg via INTRATRACHEAL

## 2022-05-05 MED ORDER — 0.9 % SODIUM CHLORIDE (POUR BTL) OPTIME
TOPICAL | Status: DC | PRN
  Administered 2022-05-05: 200 mL

## 2022-05-05 MED ORDER — FENTANYL CITRATE (PF) 100 MCG/2ML IJ SOLN
25.0000 ug | INTRAMUSCULAR | Status: DC | PRN
  Administered 2022-05-05 (×2): 25 ug via INTRAVENOUS

## 2022-05-05 MED ORDER — MEPERIDINE HCL 25 MG/ML IJ SOLN: 6.2500 mg | INTRAMUSCULAR | Status: DC | PRN

## 2022-05-05 MED ORDER — DEXAMETHASONE SODIUM PHOSPHATE 10 MG/ML IJ SOLN
INTRAMUSCULAR | Status: DC | PRN
  Administered 2022-05-05: 10 mg via INTRAVENOUS

## 2022-05-05 MED ORDER — ONDANSETRON HCL 4 MG/2ML IJ SOLN: 4.0000 mg | Freq: Once | INTRAMUSCULAR | Status: DC | PRN

## 2022-05-05 MED ORDER — ONDANSETRON HCL 4 MG/2ML IJ SOLN
INTRAMUSCULAR | Status: DC | PRN
  Administered 2022-05-05: 4 mg via INTRAVENOUS

## 2022-05-05 MED ORDER — OXYCODONE HCL 5 MG/5ML PO SOLN: 5.0000 mg | Freq: Once | ORAL | Status: AC | PRN

## 2022-05-05 MED ORDER — FENTANYL CITRATE (PF) 100 MCG/2ML IJ SOLN
INTRAMUSCULAR | Status: DC | PRN
  Administered 2022-05-05: 50 ug via INTRAVENOUS

## 2022-05-05 MED ORDER — MIDAZOLAM HCL 2 MG/2ML IJ SOLN
INTRAMUSCULAR | Status: AC
  Filled 2022-05-05: qty 2

## 2022-05-05 MED ORDER — BUPIVACAINE-EPINEPHRINE 0.25% -1:200000 IJ SOLN
INTRAMUSCULAR | Status: DC | PRN
  Administered 2022-05-05: 10 mL

## 2022-05-05 MED ORDER — PROPOFOL 10 MG/ML IV BOLUS
INTRAVENOUS | Status: DC | PRN
  Administered 2022-05-05: 150 mg via INTRAVENOUS

## 2022-05-05 MED ORDER — FENTANYL CITRATE (PF) 100 MCG/2ML IJ SOLN
INTRAMUSCULAR | Status: AC
  Filled 2022-05-05: qty 2

## 2022-05-05 MED ORDER — KETOROLAC TROMETHAMINE 15 MG/ML IJ SOLN: 15.0000 mg | Freq: Once | INTRAMUSCULAR | Status: DC

## 2022-05-05 MED ORDER — PHENYLEPHRINE HCL (PRESSORS) 10 MG/ML IV SOLN
INTRAVENOUS | Status: DC | PRN
  Administered 2022-05-05: 80 ug via INTRAVENOUS

## 2022-05-05 MED ORDER — ACETAMINOPHEN 160 MG/5ML PO SOLN: 325.0000 mg | ORAL | Status: DC | PRN

## 2022-05-05 MED ORDER — ONDANSETRON HCL 4 MG/2ML IJ SOLN
INTRAMUSCULAR | Status: AC
  Filled 2022-05-05: qty 2

## 2022-05-05 SURGICAL SUPPLY — 42 items
APL PRP STRL LF DISP 70% ISPRP (MISCELLANEOUS) ×1
APL SKNCLS STERI-STRIP NONHPOA (GAUZE/BANDAGES/DRESSINGS) ×1
BENZOIN TINCTURE PRP APPL 2/3 (GAUZE/BANDAGES/DRESSINGS) ×1 IMPLANT
BLADE HEX COATED 2.75 (ELECTRODE) ×1 IMPLANT
BLADE SURG 15 STRL LF DISP TIS (BLADE) ×1 IMPLANT
BLADE SURG 15 STRL SS (BLADE) ×1
CANISTER SUCT 1200ML W/VALVE (MISCELLANEOUS) IMPLANT
CHLORAPREP W/TINT 26 (MISCELLANEOUS) ×1 IMPLANT
COVER BACK TABLE 60X90IN (DRAPES) ×1 IMPLANT
COVER MAYO STAND STRL (DRAPES) ×1 IMPLANT
DRAPE LAPAROTOMY 100X72 PEDS (DRAPES) ×1 IMPLANT
DRAPE UTILITY XL STRL (DRAPES) ×1 IMPLANT
DRSG TEGADERM 4X4.75 (GAUZE/BANDAGES/DRESSINGS) ×1 IMPLANT
ELECT REM PT RETURN 9FT ADLT (ELECTROSURGICAL) ×1
ELECTRODE REM PT RTRN 9FT ADLT (ELECTROSURGICAL) ×1 IMPLANT
GAUZE SPONGE 4X4 12PLY STRL LF (GAUZE/BANDAGES/DRESSINGS) IMPLANT
GLOVE BIO SURGEON STRL SZ 6.5 (GLOVE) IMPLANT
GLOVE BIO SURGEON STRL SZ7 (GLOVE) ×1 IMPLANT
GLOVE BIOGEL PI IND STRL 7.0 (GLOVE) IMPLANT
GLOVE BIOGEL PI IND STRL 7.5 (GLOVE) ×1 IMPLANT
GOWN STRL REUS W/ TWL LRG LVL3 (GOWN DISPOSABLE) ×2 IMPLANT
GOWN STRL REUS W/TWL LRG LVL3 (GOWN DISPOSABLE) ×4
KIT MARKER MARGIN INK (KITS) IMPLANT
NDL HYPO 25X1 1.5 SAFETY (NEEDLE) ×1 IMPLANT
NEEDLE HYPO 25X1 1.5 SAFETY (NEEDLE) ×1 IMPLANT
NS IRRIG 1000ML POUR BTL (IV SOLUTION) ×1 IMPLANT
PACK BASIN DAY SURGERY FS (CUSTOM PROCEDURE TRAY) ×1 IMPLANT
PENCIL SMOKE EVACUATOR (MISCELLANEOUS) ×1 IMPLANT
SLEEVE SCD COMPRESS KNEE MED (STOCKING) ×1 IMPLANT
SPONGE GAUZE 2X2 8PLY STRL LF (GAUZE/BANDAGES/DRESSINGS) IMPLANT
SPONGE T-LAP 4X18 ~~LOC~~+RFID (SPONGE) ×1 IMPLANT
STRIP CLOSURE SKIN 1/2X4 (GAUZE/BANDAGES/DRESSINGS) ×1 IMPLANT
SUT CHROMIC 3 0 SH 27 (SUTURE) IMPLANT
SUT MON AB 4-0 PC3 18 (SUTURE) ×1 IMPLANT
SUT SILK 2 0 SH (SUTURE) IMPLANT
SUT VIC AB 3-0 SH 27 (SUTURE) ×1
SUT VIC AB 3-0 SH 27X BRD (SUTURE) ×1 IMPLANT
SYR BULB EAR ULCER 3OZ GRN STR (SYRINGE) ×1 IMPLANT
SYR CONTROL 10ML LL (SYRINGE) ×1 IMPLANT
TOWEL GREEN STERILE FF (TOWEL DISPOSABLE) ×1 IMPLANT
TUBE CONNECTING 20X1/4 (TUBING) ×1 IMPLANT
YANKAUER SUCT BULB TIP NO VENT (SUCTIONS) ×1 IMPLANT

## 2022-05-05 NOTE — Anesthesia Postprocedure Evaluation (Signed)
Anesthesia Post Note  Patient: Rebecca [name]  Procedure(s) Performed: RE-EXCISION OF LEFT BREAST LUMPECTOMY SITE POSTERIOR MARGIN (Left: Breast)     Patient location during evaluation: PACU Anesthesia Type: General Level of consciousness: sedated Pain management: pain level controlled Vital Signs Assessment: post-procedure vital signs reviewed and stable Respiratory status: spontaneous breathing Cardiovascular status: stable Postop Assessment: no apparent nausea or vomiting Anesthetic complications: no  No notable events documented.  Last Vitals:  Vitals:   05/05/22 1048 05/05/22 1100  BP:  129/65  Pulse: 75 74  Resp: 18 19  Temp:    SpO2: 100% 100%    Last Pain:  Vitals:   05/05/22 1100  PainSc: [name]

## 2022-05-05 NOTE — H&P (Signed)
Subjective    Breast MD 04/01/22 [place]/ [place]   Chief Complaint: Breast Cancer       History of Present Illness: Rebecca [name] is a 66 y.o. female who is seen today as an office consultation at the request of Dr. Chryl Heck for evaluation of Breast Cancer .     This is a 66 year old female in good health with a family history of breast cancer in her mother (diagnosed at age 51) as well as her daughter (diagnosed at age 13) who presents after recent screening mammogram.  This revealed a 7 x 3 x 8 mm area in the left upper inner quadrant located at 1130, 2 cm from the nipple.  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  ER/PR positive.  The patient is referred to [facility] for her initial evaluation.  No previous breast problems or breast surgery.  On 04/21/22, she underwent left breast lumpectomy.  The posterior margin was positive, so she presents now for reexcision of the posterior margin.     Review of Systems: A complete review of systems was obtained from the patient.  I have reviewed this information and discussed as appropriate with the patient.  See HPI as well for other ROS.   Review of Systems  Eyes:  Positive for blurred vision.        Medical History: Past Medical History         Past Medical History:  Diagnosis Date   Arthritis     Asthma, unspecified asthma severity, unspecified whether complicated, unspecified whether persistent     GERD (gastroesophageal reflux disease)     History of cancer     Hyperlipidemia     Hypertension     Sleep apnea               Patient Active Problem List  Diagnosis   Asthma   Intraductal carcinoma in situ of breast   Legal blindness   Vitamin D deficiency   OSA (obstructive sleep apnea)   OA (osteoarthritis) of knee   Essential hypertension, benign   Hyperlipidemia   Hearing loss in right ear   Ringing in right ear      Past Surgical History            Past Surgical History:  Procedure Laterality Date   .knee surgery  N/A 1992   HYSTERECTOMY VAGINAL   2006   .laparoscopic gastric banding  N/A 10/20/2011   ARTHROPLASTY TOTAL KNEE Right 08/18/2021   .eye surgery  N/A      Unknown date        Allergies         Allergies  Allergen Reactions   Hydrocodone Hives, Itching, Other (See Comments) and Shortness Of Breath   Sulfa (Sulfonamide Antibiotics) Hives                   Current Outpatient Medications on File Prior to Visit  Medication Sig Dispense Refill   albuterol (PROVENTIL) 2.5 mg /3 mL (0.083 %) nebulizer solution Take 2.5 mg by nebulization every 4 (four) hours as needed for Wheezing or Shortness of Breath       albuterol 90 mcg/actuation inhaler Inhale 2 inhalations into the lungs every 4 (four) hours as needed for Wheezing (cough)       budesonide-formoteroL (SYMBICORT) 160-4.5 mcg/actuation inhaler Take 2 inhalations by mouth 2 (two) times daily       ergocalciferol, vitamin D2,  1,250 mcg (50,000 unit) capsule Take 50,000 Units by mouth every 7 (seven) days       hydroCHLOROthiazide (HYDRODIURIL) 12.5 MG tablet Take 12.5 mg by mouth once daily       lisinopriL (ZESTRIL) 30 MG tablet Take 1 tablet by mouth once daily       montelukast (SINGULAIR) 10 mg tablet Take 1 tablet by mouth at bedtime       multivitamin with minerals tablet Take 1 tablet by mouth daily with breakfast        No current facility-administered medications on file prior to visit.      Family History           Family History  Problem Relation Age of Onset   Breast cancer Mother     Cancer Mother     Coronary Artery Disease (Blocked arteries around heart) Father     High blood pressure (Hypertension) Father     Breast cancer Daughter          Social History           Tobacco Use  Smoking Status Former   Types: Cigarettes   Quit date: 1993   Years since quitting: 30.8  Smokeless Tobacco Never      Social History  Social History              Socioeconomic History   Marital status: Married  Tobacco Use   Smoking status: Former      Types: Cigarettes      Quit date: 1993      Years since quitting: 30.8   Smokeless tobacco: Never  Vaping Use   Vaping Use: Never used  Substance and Sexual Activity   Alcohol use: Never   Drug use: Never   Sexual activity: Defer        Objective:      Physical Exam    Constitutional:  WDWN in NAD, conversant, no obvious deformities; lying in bed comfortably Eyes:  Pupils equal, round; sclera anicteric; moist conjunctiva; no lid lag HENT:  Oral mucosa moist; good dentition  Neck:  No masses palpated, trachea midline; no thyromegaly Lungs:  CTA bilaterally; normal respiratory effort Breasts:  symmetric, no nipple changes; no palpable masses or lymphadenopathy on either side; slight bruising from biopsy in LUIQ CV:  Regular rate and rhythm; no murmurs; extremities well-perfused with no edema Abd:  +bowel sounds, soft, non-tender, no palpable organomegaly; no palpable hernias Musc:  Unable to assess gait; no apparent clubbing or cyanosis in extremities Lymphatic:  No palpable cervical or axillary lymphadenopathy Skin:  Warm, dry; no sign of jaundice Psychiatric - alert and oriented x 4; calm mood and affect     Labs, Imaging and Diagnostic Testing: Diagnosis Breast, left, needle core biopsy, 11:30 o'clock DUCTAL CARCINOMA IN SITU, INTERMEDIATE TO HIGH NUCLEAR GRADE, SOLID AND CRIBRIFORM TYPES WITH FOCAL NECROSIS NEGATIVE FOR INVASIVE CARCINOMA NEGATIVE FOR MICROCALCIFICATIONS TUMOR MEASURES 5 MM IN GREATEST LINEAR EXTENT PROGNOSTIC INDICATORS Results: IMMUNOHISTOCHEMICAL AND MORPHOMETRIC ANALYSIS PERFORMED MANUALLY Estrogen Receptor: 95%, POSITIVE, STRONG STAINING INTENSITY Progesterone Receptor: 40%, POSITIVE, STRONG STAINING INTENSITY REFERENCE RANGE ESTROGEN RECEPTOR NEGATIVE 0% POSITIVE =>1% REFERENCE RANGE PROGESTERONE RECEPTOR NEGATIVE 0% POSITIVE =>1% All  controls stained appropriately Casimer Lanius MD Pathologist, Electronic Signature ( Signed 03/26/2022)   CLINICAL DATA:  The patient was called back for a left breast mass.   EXAM: DIGITAL DIAGNOSTIC UNILATERAL LEFT MAMMOGRAM WITH TOMOSYNTHESIS; ULTRASOUND LEFT BREAST LIMITED   TECHNIQUE: Left digital diagnostic mammography and breast tomosynthesis  was performed.; Targeted ultrasound examination of the left breast was performed.   COMPARISON:  Previous exam(s).   ACR Breast Density Category b: There are scattered areas of fibroglandular density.   FINDINGS: The mass in the slightly medial superior left breast persists.   Targeted ultrasound is performed, showing a mass at 11:30, 2 cm from the nipple measuring 7 x 3 x 8 mm, solid in appearance. No axillary adenopathy.   IMPRESSION: New solid left breast mass identified at 11:30, 2 cm from the nipple.   RECOMMENDATION: Recommend ultrasound-guided biopsy of the new left breast mass at 11:30.   I have discussed the findings and recommendations with the patient. If applicable, a reminder letter will be sent to the patient regarding the next appointment.   BI-RADS CATEGORY  4: Suspicious.     Electronically Signed   By: Dorise Bullion III M.D.   On: 03/17/2022 13:34     Assessment and Plan:  Diagnoses and all orders for this visit:   Intraductal carcinoma in situ of left breast   Positive posterior margin.  Reexcision of posterior margin left breast lumpectomy.  The surgical procedure has been discussed with the patient.  Potential risks, benefits, alternative treatments, and expected outcomes have been explained.  All of the patient's questions at this time have been answered.  The likelihood of reaching the patient's treatment goal is good.  The patient understand the proposed surgical procedure and wishes to proceed.  Imogene Burn. Georgette Dover, MD, [facility] Surgery  General Surgery   05/05/2022 8:47  AM

## 2022-05-05 NOTE — Transfer of Care (Signed)
Immediate Anesthesia Transfer of Care Note  Patient: Rebecca [name]  Procedure(s) Performed: RE-EXCISION OF LEFT BREAST LUMPECTOMY SITE POSTERIOR MARGIN (Left: Breast)  Patient Location: PACU  Anesthesia Type:General  Level of Consciousness: drowsy and patient cooperative  Airway & Oxygen Therapy: Patient Spontanous Breathing and Patient connected to face mask oxygen  Post-op Assessment: Report given to RN and Post -op Vital signs reviewed and stable  Post vital signs: Reviewed and stable  Last Vitals:  Vitals Value Taken Time  BP    Temp    Pulse 78 05/05/22 1044  Resp    SpO2 100 % 05/05/22 1044  Vitals shown include unvalidated device data.  Last Pain:  Vitals:   05/05/22 0904  PainSc: 0-No pain      Patients Stated Pain Goal: 4 (50/09/38 1829)  Complications: No notable events documented.

## 2022-05-05 NOTE — Anesthesia Procedure Notes (Signed)
Procedure Name: LMA Insertion Date/Time: 05/05/2022 10:06 AM  Performed by: Glory Buff, CRNAPre-anesthesia Checklist: Patient identified, Emergency Drugs available, Suction available and Patient being monitored Patient Re-evaluated:Patient Re-evaluated prior to induction Oxygen Delivery Method: Circle system utilized Preoxygenation: Pre-oxygenation with 100% oxygen Induction Type: IV induction LMA: LMA inserted LMA Size: 4.0 Number of attempts: 1 Placement Confirmation: positive ETCO2 Tube secured with: Tape Dental Injury: Teeth and Oropharynx as per pre-operative assessment

## 2022-05-05 NOTE — Op Note (Signed)
Pre-op diagnosis: Ductal carcinoma in situ left breast Postop diagnosis: Same Procedure performed: Reexcision of posterior margin left lumpectomy site Surgeon:Kately Graffam K Kathalina Ostermann Resident:  Dr. Don Perking I was personally present during the key and critical portions of this procedure and immediately available throughout the entire procedure, as documented in my operative note. Anesthesia: General Indications: This is a 66 year old female who was recently diagnosed with DCIS of the left breast.  On 04/21/2022 she underwent left breast lumpectomy.  The posterior margin was focally positive so she presents now for reexcision.  Description of procedure: The patient was brought to the operating room and placed in the supine position on the operating room table.  After an adequate level of general anesthesia was obtained, the left breast was prepped with ChloraPrep and draped sterile fashion.  A timeout was taken to ensure the proper patient and proper procedure.  We infiltrated the incision with local anesthetic.  Scalpel was used to open the incision.  We remove the old sutures.  The seroma cavity was evacuated.  We placed retractors.  We excised a 1 cm thickness of the posterior margin.  This was oriented with the paint kit.  This was sent for pathology.  Cautery was used for hemostasis.  The wound was thoroughly irrigated.  We closed the wound with a deep layer of 3-0 Vicryl and a subcuticular layer of 4-0 Monocryl.  Benzoin and Steri-Strips were applied.  The patient was extubated and brought to recovery room in stable condition.  All sponge, instrument, and needle counts are correct.  Imogene Burn. Georgette Dover, MD, [facility] Surgery  General Surgery   05/05/2022 10:32 AM

## 2022-05-05 NOTE — Discharge Instructions (Addendum)
  Post Anesthesia Home Care Instructions  Activity: Get plenty of rest for the remainder of the day. A responsible individual must stay with you for 24 hours following the procedure.  For the next 24 hours, DO NOT: -Drive a car -Paediatric nurse -Drink alcoholic beverages -Take any medication unless instructed by your physician -Make any legal decisions or sign important papers.  Meals: Start with liquid foods such as gelatin or soup. Progress to regular foods as tolerated. Avoid greasy, spicy, heavy foods. If nausea and/or vomiting occur, drink only clear liquids until the nausea and/or vomiting subsides. Call your physician if vomiting continues.  Special Instructions/Symptoms: Your throat may feel dry or sore from the anesthesia or the breathing tube placed in your throat during surgery. If this causes discomfort, gargle with warm salt water. The discomfort should disappear within 24 hours.  If you had a scopolamine patch placed behind your ear for the management of post- operative nausea and/or vomiting:  1. The medication in the patch is effective for 72 hours, after which it should be removed.  Wrap patch in a tissue and discard in the trash. Wash hands thoroughly with soap and water. 2. You may remove the patch earlier than 72 hours if you experience unpleasant side effects which may include dry mouth, dizziness or visual disturbances. 3. Avoid touching the patch. Wash your hands with soap and water after contact with the patch.    Post Anesthesia Home Care Instructions  Activity: Get plenty of rest for the remainder of the day. A responsible individual must stay with you for 24 hours following the procedure.  For the next 24 hours, DO NOT: -Drive a car -Paediatric nurse -Drink alcoholic beverages -Take any medication unless instructed by your physician -Make any legal decisions or sign important papers.  Meals: Start with liquid foods such as gelatin or soup. Progress to  regular foods as tolerated. Avoid greasy, spicy, heavy foods. If nausea and/or vomiting occur, drink only clear liquids until the nausea and/or vomiting subsides. Call your physician if vomiting continues.  Special Instructions/Symptoms: Your throat may feel dry or sore from the anesthesia or the breathing tube placed in your throat during surgery. If this causes discomfort, gargle with warm salt water. The discomfort should disappear within 24 hours.     Next dose of Tylenol can be taken at 3pm today if needed.

## 2022-05-06 ENCOUNTER — Encounter (HOSPITAL_BASED_OUTPATIENT_CLINIC_OR_DEPARTMENT_OTHER): Payer: Self-pay | Admitting: Surgery

## 2022-05-06 LAB — SURGICAL PATHOLOGY

## 2022-05-08 ENCOUNTER — Encounter: Payer: Self-pay | Admitting: *Deleted

## 2022-05-12 NOTE — Progress Notes (Signed)
Location of Breast Cancer: Ductal carcinoma in situ (DCIS) of left breast.    Histology per Pathology Report:   03/24/2022 Diagnosis Breast, left, needle core biopsy, 11:30 o'clock DUCTAL CARCINOMA IN SITU, INTERMEDIATE TO HIGH NUCLEAR GRADE, SOLID AND CRIBRIFORM TYPES WITH FOCAL NECROSIS NEGATIVE FOR INVASIVE CARCINOMA NEGATIVE FOR MICROCALCIFICATIONS TUMOR MEASURES 5 MM IN GREATEST LINEAR EXTENT  04/21/2022 FINAL MICROSCOPIC DIAGNOSIS:   A. BREAST, LEFT, LUMPECTOMY:  - Ductal carcinoma in situ, intermediate to high-grade, solid and  cribriform types with necrosis  - Negative for invasive carcinoma  - Ductal carcinoma in situ abuts posterior margin (see Comment)  - Biopsy site changes present   05/05/2022 FINAL MICROSCOPIC DIAGNOSIS:   A. LEFT BREAST, POSTERIOR MARGIN, RE-EXCISION:  Focal ductal carcinoma in situ  Negative for invasive carcinoma  DCIS present in green inked anterior margin  DCIS 3 mm from the blue inked inferior margin and 8 mm from the black  inked posterior margin  Changes consistent with prior procedure   Receptor Status: ER(95%), PR (40%), Her2-neu (), Ki-()  Did patient present with symptoms (if so, please note symptoms) or was this found on screening mammography?: screening mammogram  Past/Anticipated interventions by surgeon, if FPO:IPPG breast radioactive seed localized lumpectomy on 04/21/2022 and reexcision of posterior margin left lumpectomy site on 05/05/2022 by Dr. Georgette Dover.  Past/Anticipated interventions by medical oncology, if any: antiestrogen therapy to follow radiation.  Lymphedema issues, if any: No    Pain issues, if any: She reports spasms and tenderness at the surgical site.    SAFETY ISSUES: Prior radiation? No Pacemaker/ICD? No Possible current pregnancy?no Is the patient on methotrexate? No  Current Complaints / other details:      Jacqulyn Liner, RN 05/12/2022,12:51 PM

## 2022-05-14 ENCOUNTER — Encounter (HOSPITAL_COMMUNITY): Payer: Self-pay

## 2022-05-19 NOTE — Progress Notes (Signed)
Radiation Oncology         [phone] ________________________________  Name: Rebecca [name] MRN: [id number]  Date: 05/20/2022  DOB: 1956/05/11  Re-Evaluation Note  CC: Billie Ruddy, MD  Benay Pike, MD  No diagnosis found.  Diagnosis:   Stage 0 (cTis (DCIS), cN0, cM0) Left Breast, Intermediate to high-grade DCIS, ER+ / PR+ / Her2 not assessed, s/p lumpectomy   Narrative:  The patient returns today to discuss radiation treatment options. She was seen in the multidisciplinary breast clinic on 04/01/22.   Of note: Prior to her current diagnosis, she underwent genetic testing on 04/18/2017. Results showed no clinically significant variants/mutations detected by [facility] genetic testing.  Since her breast clinic consultation date, she opted to proceed with a left breast lumpectomy without nodal biopsies on 04/21/22 under the care of Dr. Georgette Dover. Pathology from the procedure revealed: intermediate to high grade DCIS measuring 1.6 cm with necrosis. Margin status to in-situ disease of less than 0.1 mm from the posterior margin (posterior margin focally positive for DCIS). Prognostic indicators significant for: estrogen receptor 95% positive and progesterone receptor 40% positive, both with strong staining intensity; Her2 not assessed.    Given close margins, the patient underwent re-excision of the posterior margin on 05/05/22. Pathology revealed focal DCIS, also with DCIS present in the marked anterior margin, and DCIS located 3 mm from the marked inferior margin and 8 mm from the marked posterior margin. All final margins are clear s/p re-excision.   The patient met with Dr. Chryl Heck on 05/04/22 to discuss adjuvant treatment options. During this visit, the patient was noted to report episodes of spasms to her surgical site. She otherwise denied any other post-op concerns. Antiestrogen treatment options were reviewed with the patient, and given concerns of bone density loss, the patient  is currently leaning towards therapy consisting of tamoxifen following XRT.   On review of systems, the patient reports ***. She denies *** and any other symptoms.    Allergies:  is allergic to hydrocodone and sulfa antibiotics.  Meds: Current Outpatient Medications  Medication Sig Dispense Refill   albuterol (PROVENTIL) (2.5 MG/3ML) 0.083% nebulizer solution Take 3 mLs (2.5 mg total) by nebulization every 4 (four) hours as needed for wheezing or shortness of breath. 75 mL 1   albuterol (VENTOLIN HFA) 108 (90 Base) MCG/ACT inhaler Use 2 puffs every 4 hours as needed for cough or wheeze.  May use  2 puffs 10-20 minutes prior to exercise. 18 g 1   ALVESCO 160 MCG/ACT inhaler Inhale 1 puff into the lungs 2 (two) times daily. (Patient not taking: Reported on 04/03/2022) 1 each 2   cholecalciferol (VITAMIN D3) 25 MCG (1000 UNIT) tablet Take 1,000 Units by mouth daily.     hydrochlorothiazide (HYDRODIURIL) 12.5 MG tablet TAKE 1 TABLET BY MOUTH EVERY DAY 90 tablet 1   lisinopril (ZESTRIL) 30 MG tablet TAKE 1 TABLET BY MOUTH EVERY DAY 90 tablet 1   meclizine (ANTIVERT) 12.5 MG tablet Take 12.5 mg by mouth 3 (three) times daily as needed for dizziness.     montelukast (SINGULAIR) 10 MG tablet TAKE 1 TABLET BY MOUTH EVERYDAY AT BEDTIME 30 tablet 0   Multiple Vitamins-Minerals (MULTIVITAMIN WITH MINERALS) tablet Take 1 tablet by mouth daily with breakfast.     vitamin B-12 (CYANOCOBALAMIN) 100 MCG tablet Take 100 mcg by mouth daily.     No current facility-administered medications for this encounter.    Physical Findings: The patient is in no acute distress.  Patient is alert and oriented.  vitals were not taken for this visit.  No significant changes. Lungs are clear to auscultation bilaterally. Heart has regular rate and rhythm. No palpable cervical, supraclavicular, or axillary adenopathy. Abdomen soft, non-tender, normal bowel sounds. Right Breast: no palpable mass, nipple discharge or  bleeding. Left Breast: ***  Lab Findings: Lab Results  Component Value Date   WBC 6.8 04/01/2022   HGB 12.9 04/01/2022   HCT 37.6 04/01/2022   MCV 90.8 04/01/2022   PLT 400 04/01/2022    Radiographic Findings: MM Breast Surgical Specimen  Result Date: 04/21/2022 CLINICAL DATA:  Post lumpectomy specimen radiograph EXAM: SPECIMEN RADIOGRAPH OF THE LEFT BREAST COMPARISON:  Previous exam(s). FINDINGS: Status post excision of the left breast. The radioactive seed and biopsy marker clip are present, completely intact, and were marked for pathology. IMPRESSION: Specimen radiograph of the left breast. Electronically Signed   By: Audie Pinto M.D.   On: 04/21/2022 12:56  MM LT RADIOACTIVE SEED LOC MAMMO GUIDE  Result Date: 04/20/2022 CLINICAL DATA:  Seed localization prior to surgery. Biopsy proven ductal carcinoma in-situ. EXAM: MAMMOGRAPHIC GUIDED RADIOACTIVE SEED LOCALIZATION OF THE LEFT BREAST COMPARISON:  Previous exam(s). FINDINGS: Patient presents for radioactive seed localization prior to surgery. I met with the patient and we discussed the procedure of seed localization including benefits and alternatives. We discussed the high likelihood of a successful procedure. We discussed the risks of the procedure including infection, bleeding, tissue injury and further surgery. We discussed the low dose of radioactivity involved in the procedure. Informed, written consent was given. The usual time-out protocol was performed immediately prior to the procedure. Using mammographic guidance, sterile technique, 1% lidocaine and an I-125 radioactive seed, the ribbon shaped clip was localized using a superior to inferior approach. The follow-up mammogram images confirm the seed in the expected location and were marked for Dr. Georgette Dover. Follow-up survey of the patient confirms the presence of the radioactive seed. Order number of I-125 seed:  [id number]. Total activity:  7.121 millicuries reference Date:  04/01/2022 The patient tolerated the procedure well and was released from the [facility]. She was given instructions regarding seed removal. IMPRESSION: Radioactive seed localization left breast. The radioactive seed is located posterior to the ribbon shaped clip at the posterior and lateral margin of the mass. No apparent complications. Electronically Signed   By: Lillia Mountain M.D.   On: 04/20/2022 14:40   Impression:   Stage 0 (cTis (DCIS), cN0, cM0) Left Breast, Intermediate to high-grade DCIS, ER+ / PR+ / Her2 not assessed, s/p lumpectomy   ***  Plan:  Patient is scheduled for CT simulation {date/later today}. ***  -----------------------------------  Blair Promise, PhD, MD  This document serves as a record of services personally performed by Gery Pray, MD. It was created on his behalf by Roney Mans, a trained medical scribe. The creation of this record is based on the scribe's personal observations and the provider's statements to them. This document has been checked and approved by the attending provider.

## 2022-05-20 ENCOUNTER — Ambulatory Visit
Admission: RE | Admit: 2022-05-20 | Discharge: 2022-05-20 | Disposition: A | Discharge status: Home / Self Care | Payer: 59 | Source: Ambulatory Visit | Attending: Radiation Oncology | Admitting: Radiation Oncology

## 2022-05-20 ENCOUNTER — Encounter: Payer: Self-pay | Admitting: Radiation Oncology

## 2022-05-20 ENCOUNTER — Other Ambulatory Visit: Payer: Self-pay

## 2022-05-20 VITALS — BP 157/56 | HR 80 | Temp 97.6°F | Resp 20 | Ht 63.0 in | Wt 195.2 lb

## 2022-05-20 DIAGNOSIS — Z79899 Other long term (current) drug therapy: Secondary | ICD-10-CM | POA: Diagnosis not present

## 2022-05-20 DIAGNOSIS — D0512 Intraductal carcinoma in situ of left breast: Secondary | ICD-10-CM | POA: Insufficient documentation

## 2022-05-20 DIAGNOSIS — Z510 Encounter for antineoplastic radiation therapy: Secondary | ICD-10-CM | POA: Insufficient documentation

## 2022-05-20 DIAGNOSIS — Z170 Estrogen receptor positive status [ER+]: Secondary | ICD-10-CM | POA: Diagnosis not present

## 2022-05-20 DIAGNOSIS — Z7951 Long term (current) use of inhaled steroids: Secondary | ICD-10-CM | POA: Diagnosis not present

## 2022-05-21 DIAGNOSIS — Z510 Encounter for antineoplastic radiation therapy: Secondary | ICD-10-CM | POA: Diagnosis not present

## 2022-05-26 ENCOUNTER — Other Ambulatory Visit: Payer: Self-pay | Admitting: Allergy

## 2022-05-26 DIAGNOSIS — J310 Chronic rhinitis: Secondary | ICD-10-CM

## 2022-05-27 ENCOUNTER — Ambulatory Visit
Admission: RE | Admit: 2022-05-27 | Discharge: 2022-05-27 | Disposition: A | Discharge status: Home / Self Care | Payer: 59 | Source: Ambulatory Visit | Attending: Radiation Oncology | Admitting: Radiation Oncology

## 2022-05-27 ENCOUNTER — Encounter: Payer: Self-pay | Admitting: *Deleted

## 2022-05-27 ENCOUNTER — Other Ambulatory Visit: Payer: Self-pay

## 2022-05-27 DIAGNOSIS — Z510 Encounter for antineoplastic radiation therapy: Secondary | ICD-10-CM | POA: Diagnosis not present

## 2022-05-27 DIAGNOSIS — D0512 Intraductal carcinoma in situ of left breast: Secondary | ICD-10-CM

## 2022-05-27 LAB — RAD ONC ARIA SESSION SUMMARY
Course Elapsed Days: 0
Plan Fractions Treated to Date: 1
Plan Prescribed Dose Per Fraction: 2.67 Gy
Plan Total Fractions Prescribed: 15
Plan Total Prescribed Dose: 40.05 Gy
Reference Point Dosage Given to Date: 2.67 Gy
Reference Point Session Dosage Given: 2.67 Gy
Session Number: 1

## 2022-05-28 ENCOUNTER — Ambulatory Visit
Admission: RE | Admit: 2022-05-28 | Discharge: 2022-05-28 | Disposition: A | Discharge status: Home / Self Care | Payer: 59 | Source: Ambulatory Visit | Attending: Radiation Oncology | Admitting: Radiation Oncology

## 2022-05-28 ENCOUNTER — Other Ambulatory Visit: Payer: Self-pay

## 2022-05-28 DIAGNOSIS — Z510 Encounter for antineoplastic radiation therapy: Secondary | ICD-10-CM | POA: Diagnosis not present

## 2022-05-28 LAB — RAD ONC ARIA SESSION SUMMARY
Course Elapsed Days: 1
Plan Fractions Treated to Date: 2
Plan Prescribed Dose Per Fraction: 2.67 Gy
Plan Total Fractions Prescribed: 15
Plan Total Prescribed Dose: 40.05 Gy
Reference Point Dosage Given to Date: 5.34 Gy
Reference Point Session Dosage Given: 2.67 Gy
Session Number: 2

## 2022-05-29 ENCOUNTER — Ambulatory Visit
Admission: RE | Admit: 2022-05-29 | Discharge: 2022-05-29 | Disposition: A | Discharge status: Home / Self Care | Payer: 59 | Source: Ambulatory Visit | Attending: Radiation Oncology | Admitting: Radiation Oncology

## 2022-05-29 ENCOUNTER — Other Ambulatory Visit: Payer: Self-pay

## 2022-05-29 DIAGNOSIS — Z510 Encounter for antineoplastic radiation therapy: Secondary | ICD-10-CM | POA: Diagnosis not present

## 2022-05-29 LAB — RAD ONC ARIA SESSION SUMMARY
Course Elapsed Days: 2
Plan Fractions Treated to Date: 3
Plan Prescribed Dose Per Fraction: 2.67 Gy
Plan Total Fractions Prescribed: 15
Plan Total Prescribed Dose: 40.05 Gy
Reference Point Dosage Given to Date: 8.01 Gy
Reference Point Session Dosage Given: 2.67 Gy
Session Number: 3

## 2022-06-01 ENCOUNTER — Other Ambulatory Visit: Payer: Self-pay

## 2022-06-01 ENCOUNTER — Ambulatory Visit
Admission: RE | Admit: 2022-06-01 | Discharge: 2022-06-01 | Disposition: A | Discharge status: Home / Self Care | Payer: 59 | Source: Ambulatory Visit | Attending: Radiation Oncology | Admitting: Radiation Oncology

## 2022-06-01 DIAGNOSIS — Z510 Encounter for antineoplastic radiation therapy: Secondary | ICD-10-CM | POA: Diagnosis not present

## 2022-06-01 LAB — RAD ONC ARIA SESSION SUMMARY
Course Elapsed Days: 5
Plan Fractions Treated to Date: 4
Plan Prescribed Dose Per Fraction: 2.67 Gy
Plan Total Fractions Prescribed: 15
Plan Total Prescribed Dose: 40.05 Gy
Reference Point Dosage Given to Date: 10.68 Gy
Reference Point Session Dosage Given: 2.67 Gy
Session Number: 4

## 2022-06-02 ENCOUNTER — Ambulatory Visit
Admission: RE | Admit: 2022-06-02 | Discharge: 2022-06-02 | Disposition: A | Discharge status: Home / Self Care | Payer: 59 | Source: Ambulatory Visit | Attending: Radiation Oncology | Admitting: Radiation Oncology

## 2022-06-02 ENCOUNTER — Other Ambulatory Visit: Payer: Self-pay

## 2022-06-02 DIAGNOSIS — D0512 Intraductal carcinoma in situ of left breast: Secondary | ICD-10-CM

## 2022-06-02 DIAGNOSIS — Z510 Encounter for antineoplastic radiation therapy: Secondary | ICD-10-CM | POA: Diagnosis not present

## 2022-06-02 LAB — RAD ONC ARIA SESSION SUMMARY
Course Elapsed Days: 6
Plan Fractions Treated to Date: 5
Plan Prescribed Dose Per Fraction: 2.67 Gy
Plan Total Fractions Prescribed: 15
Plan Total Prescribed Dose: 40.05 Gy
Reference Point Dosage Given to Date: 13.35 Gy
Reference Point Session Dosage Given: 2.67 Gy
Session Number: 5

## 2022-06-02 MED ORDER — RADIAPLEXRX EX GEL
Freq: Once | CUTANEOUS | Status: AC
  Administered 2022-06-02: 1 via TOPICAL

## 2022-06-02 MED ORDER — ALRA NON-METALLIC DEODORANT (RAD-ONC)
1.0000 | Freq: Once | TOPICAL | Status: AC
  Administered 2022-06-02: 1 via TOPICAL

## 2022-06-03 ENCOUNTER — Ambulatory Visit
Admission: RE | Admit: 2022-06-03 | Discharge: 2022-06-03 | Disposition: A | Discharge status: Home / Self Care | Payer: 59 | Source: Ambulatory Visit | Attending: Radiation Oncology | Admitting: Radiation Oncology

## 2022-06-03 ENCOUNTER — Other Ambulatory Visit: Payer: Self-pay

## 2022-06-03 DIAGNOSIS — Z510 Encounter for antineoplastic radiation therapy: Secondary | ICD-10-CM | POA: Diagnosis not present

## 2022-06-03 LAB — RAD ONC ARIA SESSION SUMMARY
Course Elapsed Days: 7
Plan Fractions Treated to Date: 6
Plan Prescribed Dose Per Fraction: 2.67 Gy
Plan Total Fractions Prescribed: 15
Plan Total Prescribed Dose: 40.05 Gy
Reference Point Dosage Given to Date: 16.02 Gy
Reference Point Session Dosage Given: 2.67 Gy
Session Number: 6

## 2022-06-04 ENCOUNTER — Other Ambulatory Visit: Payer: Self-pay

## 2022-06-04 ENCOUNTER — Ambulatory Visit
Admission: RE | Admit: 2022-06-04 | Discharge: 2022-06-04 | Disposition: A | Discharge status: Home / Self Care | Payer: 59 | Source: Ambulatory Visit | Attending: Radiation Oncology | Admitting: Radiation Oncology

## 2022-06-04 DIAGNOSIS — Z510 Encounter for antineoplastic radiation therapy: Secondary | ICD-10-CM | POA: Diagnosis not present

## 2022-06-04 LAB — RAD ONC ARIA SESSION SUMMARY
Course Elapsed Days: 8
Plan Fractions Treated to Date: 7
Plan Prescribed Dose Per Fraction: 2.67 Gy
Plan Total Fractions Prescribed: 15
Plan Total Prescribed Dose: 40.05 Gy
Reference Point Dosage Given to Date: 18.69 Gy
Reference Point Session Dosage Given: 2.67 Gy
Session Number: 7

## 2022-06-05 ENCOUNTER — Other Ambulatory Visit: Payer: Self-pay

## 2022-06-05 ENCOUNTER — Ambulatory Visit
Admission: RE | Admit: 2022-06-05 | Discharge: 2022-06-05 | Disposition: A | Discharge status: Home / Self Care | Payer: 59 | Source: Ambulatory Visit | Attending: Radiation Oncology | Admitting: Radiation Oncology

## 2022-06-05 DIAGNOSIS — Z510 Encounter for antineoplastic radiation therapy: Secondary | ICD-10-CM | POA: Diagnosis not present

## 2022-06-05 LAB — RAD ONC ARIA SESSION SUMMARY
Course Elapsed Days: 9
Plan Fractions Treated to Date: 8
Plan Prescribed Dose Per Fraction: 2.67 Gy
Plan Total Fractions Prescribed: 15
Plan Total Prescribed Dose: 40.05 Gy
Reference Point Dosage Given to Date: 21.36 Gy
Reference Point Session Dosage Given: 2.67 Gy
Session Number: 8

## 2022-06-09 ENCOUNTER — Other Ambulatory Visit: Payer: Self-pay

## 2022-06-09 ENCOUNTER — Ambulatory Visit
Admission: RE | Admit: 2022-06-09 | Discharge: 2022-06-09 | Disposition: A | Discharge status: Home / Self Care | Payer: 59 | Source: Ambulatory Visit | Attending: Radiation Oncology | Admitting: Radiation Oncology

## 2022-06-09 ENCOUNTER — Ambulatory Visit: Payer: 59

## 2022-06-09 DIAGNOSIS — Z510 Encounter for antineoplastic radiation therapy: Secondary | ICD-10-CM | POA: Diagnosis not present

## 2022-06-09 LAB — RAD ONC ARIA SESSION SUMMARY
Course Elapsed Days: 13
Plan Fractions Treated to Date: 9
Plan Prescribed Dose Per Fraction: 2.67 Gy
Plan Total Fractions Prescribed: 15
Plan Total Prescribed Dose: 40.05 Gy
Reference Point Dosage Given to Date: 24.03 Gy
Reference Point Session Dosage Given: 2.67 Gy
Session Number: 9

## 2022-06-10 ENCOUNTER — Other Ambulatory Visit: Payer: Self-pay

## 2022-06-10 ENCOUNTER — Ambulatory Visit: Payer: 59

## 2022-06-10 ENCOUNTER — Ambulatory Visit
Admission: RE | Admit: 2022-06-10 | Discharge: 2022-06-10 | Disposition: A | Discharge status: Home / Self Care | Payer: 59 | Source: Ambulatory Visit | Attending: Radiation Oncology | Admitting: Radiation Oncology

## 2022-06-10 DIAGNOSIS — Z510 Encounter for antineoplastic radiation therapy: Secondary | ICD-10-CM | POA: Diagnosis not present

## 2022-06-10 LAB — RAD ONC ARIA SESSION SUMMARY
Course Elapsed Days: 14
Plan Fractions Treated to Date: 10
Plan Prescribed Dose Per Fraction: 2.67 Gy
Plan Total Fractions Prescribed: 15
Plan Total Prescribed Dose: 40.05 Gy
Reference Point Dosage Given to Date: 26.7 Gy
Reference Point Session Dosage Given: 2.67 Gy
Session Number: 10

## 2022-06-11 ENCOUNTER — Ambulatory Visit: Payer: 59

## 2022-06-11 ENCOUNTER — Other Ambulatory Visit: Payer: Self-pay

## 2022-06-11 ENCOUNTER — Ambulatory Visit
Admission: RE | Admit: 2022-06-11 | Discharge: 2022-06-11 | Disposition: A | Discharge status: Home / Self Care | Payer: 59 | Source: Ambulatory Visit | Attending: Radiation Oncology | Admitting: Radiation Oncology

## 2022-06-11 DIAGNOSIS — Z510 Encounter for antineoplastic radiation therapy: Secondary | ICD-10-CM | POA: Diagnosis not present

## 2022-06-11 LAB — RAD ONC ARIA SESSION SUMMARY
Course Elapsed Days: 15
Plan Fractions Treated to Date: 11
Plan Prescribed Dose Per Fraction: 2.67 Gy
Plan Total Fractions Prescribed: 15
Plan Total Prescribed Dose: 40.05 Gy
Reference Point Dosage Given to Date: 29.37 Gy
Reference Point Session Dosage Given: 2.67 Gy
Session Number: 11

## 2022-06-12 ENCOUNTER — Ambulatory Visit
Admission: RE | Admit: 2022-06-12 | Discharge: 2022-06-12 | Disposition: A | Discharge status: Home / Self Care | Payer: 59 | Source: Ambulatory Visit | Attending: Radiation Oncology | Admitting: Radiation Oncology

## 2022-06-12 ENCOUNTER — Other Ambulatory Visit: Payer: Self-pay

## 2022-06-12 ENCOUNTER — Ambulatory Visit: Payer: 59

## 2022-06-12 DIAGNOSIS — Z510 Encounter for antineoplastic radiation therapy: Secondary | ICD-10-CM | POA: Diagnosis not present

## 2022-06-12 LAB — RAD ONC ARIA SESSION SUMMARY
Course Elapsed Days: 16
Plan Fractions Treated to Date: 12
Plan Prescribed Dose Per Fraction: 2.67 Gy
Plan Total Fractions Prescribed: 15
Plan Total Prescribed Dose: 40.05 Gy
Reference Point Dosage Given to Date: 32.04 Gy
Reference Point Session Dosage Given: 2.67 Gy
Session Number: 12

## 2022-06-16 ENCOUNTER — Ambulatory Visit: Payer: 59

## 2022-06-16 ENCOUNTER — Ambulatory Visit
Admission: RE | Admit: 2022-06-16 | Discharge: 2022-06-16 | Disposition: A | Discharge status: Home / Self Care | Payer: 59 | Source: Ambulatory Visit | Attending: Radiation Oncology | Admitting: Radiation Oncology

## 2022-06-16 ENCOUNTER — Ambulatory Visit: Payer: 59 | Admitting: Radiation Oncology

## 2022-06-16 ENCOUNTER — Other Ambulatory Visit: Payer: Self-pay

## 2022-06-16 ENCOUNTER — Encounter (HOSPITAL_BASED_OUTPATIENT_CLINIC_OR_DEPARTMENT_OTHER): Payer: Self-pay | Admitting: Pulmonary Disease

## 2022-06-16 ENCOUNTER — Ambulatory Visit (INDEPENDENT_AMBULATORY_CARE_PROVIDER_SITE_OTHER): Payer: 59 | Admitting: Pulmonary Disease

## 2022-06-16 VITALS — BP 142/100 | HR 82 | Temp 99.1°F | Ht 63.0 in | Wt 193.0 lb

## 2022-06-16 DIAGNOSIS — R682 Dry mouth, unspecified: Secondary | ICD-10-CM | POA: Diagnosis not present

## 2022-06-16 DIAGNOSIS — G4733 Obstructive sleep apnea (adult) (pediatric): Secondary | ICD-10-CM | POA: Diagnosis not present

## 2022-06-16 DIAGNOSIS — D0512 Intraductal carcinoma in situ of left breast: Secondary | ICD-10-CM | POA: Diagnosis not present

## 2022-06-16 LAB — RAD ONC ARIA SESSION SUMMARY
Course Elapsed Days: 20
Plan Fractions Treated to Date: 13
Plan Prescribed Dose Per Fraction: 2.67 Gy
Plan Total Fractions Prescribed: 15
Plan Total Prescribed Dose: 40.05 Gy
Reference Point Dosage Given to Date: 34.71 Gy
Reference Point Session Dosage Given: 2.67 Gy
Session Number: 13

## 2022-06-16 NOTE — Progress Notes (Signed)
Pulmonary, Critical Care, and Sleep Medicine  Chief Complaint  Patient presents with   Follow-up    Pt has not been seen in 3 years and the CPAP machine sent her a message to come in.     Past Surgical History:  She  has a past surgical history that includes Eye surgery; Knee surgery (1992); Breath tek h pylori (08/17/2011); Laparoscopic gastric banding (10/20/2011); Abdominal hysterectomy (2006); Colonoscopy (2008); Total knee arthroplasty (Right, 08/18/2021); Breast lumpectomy with radioactive seed localization (Left, 04/21/2022); and Re-excision of breast lumpectomy (Left, 05/05/2022).  Past Medical History:  Arthritis, Blind Lt eye, GERD, Breast cancer, Nephrolithiasis, HLD, HTN, Migraine headaches, Vertigo, s/p Gastric lap band, Asthma  Constitutional:  BP (!) 142/100 (BP Location: Right Arm, Patient Position: Sitting, Cuff Size: Normal)   Pulse 82   Temp 99.1 F (37.3 C) (Oral)   Ht '5\' 3"'$  (1.6 m)   Wt 193 lb (87.5 kg)   SpO2 99%   BMI 34.19 kg/m   Brief Summary:  Rebecca [name] is a 67 y.o. female with obstructive sleep apnea.      Subjective:   I last saw her in 2017.  She has lost about 25 lbs since then.  She uses CPAP nightly.  Uses a full face mask and this fits well.  She has been getting mouth dryness more recently and this has made it uncomfortable to wear her CPAP at times.  Otherwise she feels that using CPAP helps her sleep quality.  She asked about the [facility] device.  Physical Exam:   Appearance - well kempt   ENMT - no sinus tenderness, no oral exudate, no LAN, Mallampati 3 airway, no stridor  Respiratory - equal breath sounds bilaterally, no wheezing or rales  CV - s1s2 regular rate and rhythm, no murmurs  Ext - no clubbing, no edema  Skin - no rashes  Psych - normal mood and affect   Pulmonary testing:  Spirometry 08/30/15 >> FEV1 1.82 (78%), FEV1% 81   Sleep Tests:  PSG 09/13/11 >> AHI 59  CPAP 05/13/22 to 06/11/22 >> used on 30 of  30 nights with average 8 hrs 28 min.  Average AHI 0.5 with CPAP 13 cm H2O.  Cardiac Tests:    Social History:  She  reports that she quit smoking about 28 years ago. Her smoking use included cigarettes. She has a 7.50 pack-year smoking history. She has never used smokeless tobacco. She reports that she does not drink alcohol and does not use drugs.  Family History:  Her family history includes Alcohol abuse in her father; Breast cancer (age of onset: 75) in her mother; Breast cancer (age of onset: 77) in her daughter; Colon cancer in her brother; Heart disease (age of onset: 48) in her father; Hypertension in her father.     Assessment/Plan:   Obstructive sleep apnea. - she is compliant with CPAP and reports benefit from therapy - uses [employer] for her DME - current CPAP was ordered in February 2021 - main issue is mouth dryness; she can likely use low pressure setting since she has lost weight - will change her CPAP to 11 cm H2O; she is to call if she continues to have mouth dryness   Time Spent Involved in Patient Care on Day of Examination:  25 minutes  Follow up:   Patient Instructions  Will have [employer] change your CPAP to 11 cm water pressure.  Call if you are still having trouble with mouth dryness.  Follow up in 1 year.  Medication List:   Allergies as of 06/16/2022       Reactions   Hydrocodone Hives, Shortness Of Breath, Itching   Sulfa Antibiotics Hives        Medication List        Accurate as of June 16, 2022  2:07 PM. If you have any questions, ask your nurse or doctor.          albuterol (2.5 MG/3ML) 0.083% nebulizer solution Commonly known as: PROVENTIL Take 3 mLs (2.5 mg total) by nebulization every 4 (four) hours as needed for wheezing or shortness of breath.   albuterol 108 (90 Base) MCG/ACT inhaler Commonly known as: Ventolin HFA Use 2 puffs every 4 hours as needed for cough or wheeze.  May use  2 puffs 10-20 minutes prior to exercise.    Alvesco 160 MCG/ACT inhaler Generic drug: ciclesonide Inhale 1 puff into the lungs 2 (two) times daily.   hydrochlorothiazide 12.5 MG tablet Commonly known as: HYDRODIURIL TAKE 1 TABLET BY MOUTH EVERY DAY   lisinopril 30 MG tablet Commonly known as: ZESTRIL TAKE 1 TABLET BY MOUTH EVERY DAY   montelukast 10 MG tablet Commonly known as: SINGULAIR TAKE 1 TABLET BY MOUTH EVERYDAY AT BEDTIME   multivitamin with minerals tablet Take 1 tablet by mouth daily with breakfast.   vitamin B-12 100 MCG tablet Commonly known as: CYANOCOBALAMIN Take 100 mcg by mouth daily.        Signature:  Chesley Mires, MD [place] Pager - [phone] 06/16/2022, 2:07 PM

## 2022-06-16 NOTE — Patient Instructions (Signed)
Will have [employer] change your CPAP to 11 cm water pressure.  Call if you are still having trouble with mouth dryness.  Follow up in 1 year.

## 2022-06-17 ENCOUNTER — Other Ambulatory Visit: Payer: Self-pay

## 2022-06-17 ENCOUNTER — Ambulatory Visit
Admission: RE | Admit: 2022-06-17 | Discharge: 2022-06-17 | Disposition: A | Discharge status: Home / Self Care | Payer: 59 | Source: Ambulatory Visit | Attending: Radiation Oncology | Admitting: Radiation Oncology

## 2022-06-17 DIAGNOSIS — D0512 Intraductal carcinoma in situ of left breast: Secondary | ICD-10-CM | POA: Diagnosis not present

## 2022-06-17 LAB — RAD ONC ARIA SESSION SUMMARY
Course Elapsed Days: 21
Plan Fractions Treated to Date: 14
Plan Prescribed Dose Per Fraction: 2.67 Gy
Plan Total Fractions Prescribed: 15
Plan Total Prescribed Dose: 40.05 Gy
Reference Point Dosage Given to Date: 37.38 Gy
Reference Point Session Dosage Given: 2.67 Gy
Session Number: 14

## 2022-06-18 ENCOUNTER — Ambulatory Visit
Admission: RE | Admit: 2022-06-18 | Discharge: 2022-06-18 | Disposition: A | Discharge status: Home / Self Care | Payer: 59 | Source: Ambulatory Visit | Attending: Radiation Oncology | Admitting: Radiation Oncology

## 2022-06-18 ENCOUNTER — Other Ambulatory Visit: Payer: Self-pay

## 2022-06-18 DIAGNOSIS — D0512 Intraductal carcinoma in situ of left breast: Secondary | ICD-10-CM | POA: Diagnosis not present

## 2022-06-18 LAB — RAD ONC ARIA SESSION SUMMARY
Course Elapsed Days: 22
Plan Fractions Treated to Date: 15
Plan Prescribed Dose Per Fraction: 2.67 Gy
Plan Total Fractions Prescribed: 15
Plan Total Prescribed Dose: 40.05 Gy
Reference Point Dosage Given to Date: 40.05 Gy
Reference Point Session Dosage Given: 2.67 Gy
Session Number: 15

## 2022-06-19 ENCOUNTER — Ambulatory Visit
Admission: RE | Admit: 2022-06-19 | Discharge: 2022-06-19 | Disposition: A | Discharge status: Home / Self Care | Payer: 59 | Source: Ambulatory Visit | Attending: Radiation Oncology | Admitting: Radiation Oncology

## 2022-06-19 ENCOUNTER — Other Ambulatory Visit: Payer: Self-pay

## 2022-06-19 DIAGNOSIS — D0512 Intraductal carcinoma in situ of left breast: Secondary | ICD-10-CM | POA: Diagnosis not present

## 2022-06-19 LAB — RAD ONC ARIA SESSION SUMMARY
Course Elapsed Days: 23
Plan Fractions Treated to Date: 1
Plan Prescribed Dose Per Fraction: 2 Gy
Plan Total Fractions Prescribed: 6
Plan Total Prescribed Dose: 12 Gy
Reference Point Dosage Given to Date: 2 Gy
Reference Point Session Dosage Given: 2 Gy
Session Number: 16

## 2022-06-22 ENCOUNTER — Ambulatory Visit
Admission: RE | Admit: 2022-06-22 | Discharge: 2022-06-22 | Disposition: A | Discharge status: Home / Self Care | Payer: 59 | Source: Ambulatory Visit | Attending: Radiation Oncology | Admitting: Radiation Oncology

## 2022-06-22 ENCOUNTER — Other Ambulatory Visit: Payer: Self-pay

## 2022-06-22 DIAGNOSIS — D0512 Intraductal carcinoma in situ of left breast: Secondary | ICD-10-CM | POA: Diagnosis not present

## 2022-06-22 LAB — RAD ONC ARIA SESSION SUMMARY
Course Elapsed Days: 26
Plan Fractions Treated to Date: 2
Plan Prescribed Dose Per Fraction: 2 Gy
Plan Total Fractions Prescribed: 6
Plan Total Prescribed Dose: 12 Gy
Reference Point Dosage Given to Date: 4 Gy
Reference Point Session Dosage Given: 2 Gy
Session Number: 17

## 2022-06-23 ENCOUNTER — Ambulatory Visit
Admission: RE | Admit: 2022-06-23 | Discharge: 2022-06-23 | Disposition: A | Discharge status: Home / Self Care | Payer: 59 | Source: Ambulatory Visit | Attending: Radiation Oncology | Admitting: Radiation Oncology

## 2022-06-23 ENCOUNTER — Other Ambulatory Visit: Payer: Self-pay

## 2022-06-23 DIAGNOSIS — D0512 Intraductal carcinoma in situ of left breast: Secondary | ICD-10-CM | POA: Diagnosis not present

## 2022-06-23 LAB — RAD ONC ARIA SESSION SUMMARY
Course Elapsed Days: 27
Plan Fractions Treated to Date: 3
Plan Prescribed Dose Per Fraction: 2 Gy
Plan Total Fractions Prescribed: 6
Plan Total Prescribed Dose: 12 Gy
Reference Point Dosage Given to Date: 6 Gy
Reference Point Session Dosage Given: 2 Gy
Session Number: 18

## 2022-06-23 MED ORDER — RADIAPLEXRX EX GEL: Freq: Once | CUTANEOUS | Status: AC

## 2022-06-24 ENCOUNTER — Other Ambulatory Visit: Payer: Self-pay

## 2022-06-24 ENCOUNTER — Ambulatory Visit
Admission: RE | Admit: 2022-06-24 | Discharge: 2022-06-24 | Disposition: A | Discharge status: Home / Self Care | Payer: 59 | Source: Ambulatory Visit | Attending: Radiation Oncology | Admitting: Radiation Oncology

## 2022-06-24 DIAGNOSIS — D0512 Intraductal carcinoma in situ of left breast: Secondary | ICD-10-CM | POA: Diagnosis not present

## 2022-06-24 LAB — RAD ONC ARIA SESSION SUMMARY
Course Elapsed Days: 28
Plan Fractions Treated to Date: 4
Plan Prescribed Dose Per Fraction: 2 Gy
Plan Total Fractions Prescribed: 6
Plan Total Prescribed Dose: 12 Gy
Reference Point Dosage Given to Date: 8 Gy
Reference Point Session Dosage Given: 2 Gy
Session Number: 19

## 2022-06-25 ENCOUNTER — Ambulatory Visit
Admission: RE | Admit: 2022-06-25 | Discharge: 2022-06-25 | Disposition: A | Discharge status: Home / Self Care | Payer: 59 | Source: Ambulatory Visit | Attending: Radiation Oncology | Admitting: Radiation Oncology

## 2022-06-25 ENCOUNTER — Other Ambulatory Visit: Payer: Self-pay

## 2022-06-25 ENCOUNTER — Ambulatory Visit: Payer: 59

## 2022-06-25 ENCOUNTER — Encounter: Payer: Self-pay | Admitting: *Deleted

## 2022-06-25 DIAGNOSIS — D0512 Intraductal carcinoma in situ of left breast: Secondary | ICD-10-CM

## 2022-06-25 LAB — RAD ONC ARIA SESSION SUMMARY
Course Elapsed Days: 29
Plan Fractions Treated to Date: 5
Plan Prescribed Dose Per Fraction: 2 Gy
Plan Total Fractions Prescribed: 6
Plan Total Prescribed Dose: 12 Gy
Reference Point Dosage Given to Date: 10 Gy
Reference Point Session Dosage Given: 2 Gy
Session Number: 20

## 2022-06-26 ENCOUNTER — Ambulatory Visit
Admission: RE | Admit: 2022-06-26 | Discharge: 2022-06-26 | Disposition: A | Discharge status: Home / Self Care | Payer: 59 | Source: Ambulatory Visit | Attending: Radiation Oncology | Admitting: Radiation Oncology

## 2022-06-26 ENCOUNTER — Other Ambulatory Visit: Payer: Self-pay

## 2022-06-26 DIAGNOSIS — D0512 Intraductal carcinoma in situ of left breast: Secondary | ICD-10-CM | POA: Diagnosis not present

## 2022-06-26 LAB — RAD ONC ARIA SESSION SUMMARY
Course Elapsed Days: 30
Plan Fractions Treated to Date: 6
Plan Prescribed Dose Per Fraction: 2 Gy
Plan Total Fractions Prescribed: 6
Plan Total Prescribed Dose: 12 Gy
Reference Point Dosage Given to Date: 12 Gy
Reference Point Session Dosage Given: 2 Gy
Session Number: 21

## 2022-07-06 ENCOUNTER — Other Ambulatory Visit: Payer: Self-pay

## 2022-07-06 ENCOUNTER — Inpatient Hospital Stay: Payer: 59 | Attending: Hematology and Oncology | Admitting: Hematology and Oncology

## 2022-07-06 VITALS — BP 157/90 | HR 77 | Temp 97.5°F | Resp 16 | Ht 63.0 in | Wt 195.5 lb

## 2022-07-06 DIAGNOSIS — D0512 Intraductal carcinoma in situ of left breast: Secondary | ICD-10-CM

## 2022-07-06 DIAGNOSIS — Z87891 Personal history of nicotine dependence: Secondary | ICD-10-CM | POA: Diagnosis not present

## 2022-07-06 DIAGNOSIS — Z923 Personal history of irradiation: Secondary | ICD-10-CM | POA: Diagnosis not present

## 2022-07-06 DIAGNOSIS — Z79811 Long term (current) use of aromatase inhibitors: Secondary | ICD-10-CM | POA: Diagnosis not present

## 2022-07-06 MED ORDER — ANASTROZOLE 1 MG PO TABS: 1.0000 mg | ORAL_TABLET | Freq: Every day | ORAL | 3 refills | Status: DC

## 2022-07-06 NOTE — Assessment & Plan Note (Signed)
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   Final pathology reviewed, significant for DCIS, abutting posterior margin.  DCIS is intermediate to high-grade. She went through re excision. She is now post adj radiation, healing well.  We have once again discussed about options for anti estrogen therapy. She would like to try anastrozole. We discussed adverse effects including post menopausal symptoms, vaginal dryness, bone loss. She will RTC in 3 months for SCP.  Benay Pike MD

## 2022-07-06 NOTE — Progress Notes (Signed)
[facility] NOTE  Patient Care Team: Billie Ruddy, MD as PCP - General (Family Medicine) Himmelrich, Bryson Ha, RD (Inactive) as Dietitian Molli Posey, MD as Attending Physician (Obstetrics and Gynecology) Gwenette Greet, Armando Reichert, MD as Attending Physician (Pulmonary Disease) Gean Quint, MD (Inactive) as Attending Physician (Allergy) Rockwell Germany, RN as Oncology Nurse Navigator Mauro Kaufmann, RN as Oncology Nurse Navigator Donnie Mesa, MD as Consulting Physician (General Surgery) Benay Pike, MD as Consulting Physician (Hematology and Oncology) Gery Pray, MD as Consulting Physician (Radiation Oncology)  CHIEF COMPLAINTS/PURPOSE OF CONSULTATION:  DCIS  HISTORY OF PRESENTING ILLNESS:   Rebecca [name] 67 y.o. female is here because of recent diagnosis of left DCIS.  SUMMARY OF ONCOLOGIC HISTORY: Oncology History  Ductal carcinoma in situ (DCIS) of left breast  03/17/2022 Mammogram   New solid left breast mass identified at 11:30, 2 cm from the nipple. Targeted ultrasound is performed, showing a mass at 11:30, 2 cm from the nipple measuring 7 x 3 x 8 mm, solid in appearance. No axillary adenopathy.   03/27/2022 Initial Diagnosis   Ductal carcinoma in situ (DCIS) of left breast    Pathology Results   Pathology from left breast showed DCIS, intermediate to high nuclear grade, solid and cribriform types with focal necrosis.  Negative for invasive carcinoma.  Negative for microcalcifications.  Prognostics showed ER 95% positive strong staining PR 40% positive strong staining.    Since last visit patient underwent left lumpectomy, final pathology showed DCIS, intermediate to high-grade, solid and cribriform types with necrosis, negative for invasive carcinoma.  DCIS abuts posterior margin. She had re excision of posterior margin on 05/05/2022, negative for invasive carcinoma, DCIS present in green inked ant margin. She is now post adjuvant  radiation. She is healing well from radiation. She is here to initiate anti estrogen therapy.   Rest of the pertinent 10 point ROS reviewed and negative.  MEDICAL HISTORY:  Past Medical History:  Diagnosis Date   Arthritis    Asthma    Blindness, legal    L EYE   Breast cancer ([place])    GERD (gastroesophageal reflux disease)    Hearing loss    History of kidney stones    Hyperlipidemia    Hypertension    Lapband May 2013 11/18/2011   Migraines    Obesity, Class III, BMI 40-49.9 (morbid obesity) ([place]) 06/03/2011   Pre-diabetes    Sleep apnea    uses c-pap   Vertigo     SURGICAL HISTORY: Past Surgical History:  Procedure Laterality Date   ABDOMINAL HYSTERECTOMY  2006   fibroids   BREAST LUMPECTOMY WITH RADIOACTIVE SEED LOCALIZATION Left 04/21/2022   Procedure: LEFT BREAST LUMPECTOMY WITH RADIOACTIVE SEED LOCALIZATION;  Surgeon: Donnie Mesa, MD;  Location: [facility];  Service: General;  Laterality: Left;   BREATH TEK H PYLORI  08/17/2011   Procedure: BREATH TEK H PYLORI;  Surgeon: Pedro Earls, MD;  Location: [facility];  Service: General;  Laterality: N/A;  PATIENT WILL COME AT 0715   COLONOSCOPY  2008   @ [place]     Patient unsure of surgery date. Left eye   KNEE SURGERY  1992   right   LAPAROSCOPIC GASTRIC BANDING  10/20/2011   Procedure: LAPAROSCOPIC GASTRIC BANDING;  Surgeon: Pedro Earls, MD;  Location: [facility];  Service: General;  Laterality: N/A;   RE-EXCISION OF BREAST LUMPECTOMY Left 05/05/2022   Procedure: RE-EXCISION  OF LEFT BREAST LUMPECTOMY SITE POSTERIOR MARGIN;  Surgeon: Donnie Mesa, MD;  Location: [facility];  Service: General;  Laterality: Left;   TOTAL KNEE ARTHROPLASTY Right 08/18/2021   Procedure: TOTAL KNEE ARTHROPLASTY;  Surgeon: Gaynelle Arabian, MD;  Location: [facility];  Service: Orthopedics;  Laterality: Right;    SOCIAL HISTORY: Social History   Socioeconomic History   Marital status: Married     Spouse name: Not on file   Number of children: Not on file   Years of education: Not on file   Highest education level: Not on file  Occupational History   Not on file  Tobacco Use   Smoking status: Former    Packs/day: 0.50    Years: 15.00    Total pack years: 7.50    Types: Cigarettes    Quit date: 06/02/1994    Years since quitting: 28.1   Smokeless tobacco: Never  Vaping Use   Vaping Use: Never used  Substance and Sexual Activity   Alcohol use: No   Drug use: No   Sexual activity: Not on file    Comment: hyst  Other Topics Concern   Not on file  Social History Narrative   Work or School: works part time with [employer] - 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       Home Situation: lives with husband      Spiritual Beliefs: Christian  - Education administrator, Dentist      Lifestyle: wt watchers 2019, starting to exercise         Social Determinants of Health   Financial Resource Strain: [place]  (04/01/2022)   Overall Financial Resource Strain (CARDIA)    Difficulty of Paying Living Expenses: Not very hard  Food Insecurity: No Food Insecurity (04/01/2022)   Hunger Vital Sign    Worried About Running Out of Food in the Last Year: Never true    [place] in the Last Year: Never true  Transportation Needs: No Transportation Needs (04/01/2022)   PRAPARE - Hydrologist (Medical): No    Lack of Transportation (Non-Medical): No  Physical Activity: Inactive (10/06/2021)   Exercise Vital Sign    Days of Exercise per Week: 0 days    Minutes of Exercise per Session: 0 min  Stress: No Stress Concern Present (10/06/2021)   [place]    Feeling of Stress : Not at all  Social Connections: Not on file  Intimate Partner Violence: Not on file    FAMILY HISTORY: Family History  Problem Relation Age of Onset   Breast cancer Mother 25   Alcohol abuse  Father    Heart disease Father 89       MI   Hypertension Father    Colon cancer Brother        dx after 41   Breast cancer Daughter 36    ALLERGIES:  is allergic to hydrocodone and sulfa antibiotics.  MEDICATIONS:  Current Outpatient Medications  Medication Sig Dispense Refill   anastrozole (ARIMIDEX) 1 MG tablet Take 1 tablet (1 mg total) by mouth daily. 90 tablet 3   gabapentin (NEURONTIN) 300 MG capsule Take 300 mg by mouth 2 (two) times daily.     albuterol (PROVENTIL) (2.5 MG/3ML) 0.083% nebulizer solution Take 3 mLs (2.5 mg total) by nebulization every 4 (four) hours as needed for wheezing or shortness of breath. 75 mL 1  albuterol (VENTOLIN HFA) 108 (90 Base) MCG/ACT inhaler Use 2 puffs every 4 hours as needed for cough or wheeze.  May use  2 puffs 10-20 minutes prior to exercise. 18 g 1   ALVESCO 160 MCG/ACT inhaler Inhale 1 puff into the lungs 2 (two) times daily. 1 each 2   hydrochlorothiazide (HYDRODIURIL) 12.5 MG tablet TAKE 1 TABLET BY MOUTH EVERY DAY 90 tablet 1   lisinopril (ZESTRIL) 30 MG tablet TAKE 1 TABLET BY MOUTH EVERY DAY 90 tablet 1   montelukast (SINGULAIR) 10 MG tablet TAKE 1 TABLET BY MOUTH EVERYDAY AT BEDTIME 30 tablet 0   Multiple Vitamins-Minerals (MULTIVITAMIN WITH MINERALS) tablet Take 1 tablet by mouth daily with breakfast.     vitamin B-12 (CYANOCOBALAMIN) 100 MCG tablet Take 100 mcg by mouth daily.     No current facility-administered medications for this visit.    REVIEW OF SYSTEMS:   Constitutional: Denies fevers, chills or abnormal night sweats Eyes: Denies blurriness of vision, double vision or watery eyes Ears, nose, mouth, throat, and face: Denies mucositis or sore throat Respiratory: Denies cough, dyspnea or wheezes Cardiovascular: Denies palpitation, chest discomfort or lower extremity swelling Gastrointestinal:  Denies nausea, heartburn or change in bowel habits Skin: Denies abnormal skin rashes Lymphatics: Denies new lymphadenopathy or  easy bruising Neurological:Denies numbness, tingling or new weaknesses Behavioral/Psych: Mood is stable, no new changes  Breast: Denies any palpable lumps or discharge All other systems were reviewed with the patient and are negative.  PHYSICAL EXAMINATION: ECOG PERFORMANCE STATUS: 0 - Asymptomatic  Vitals:   07/06/22 0919  BP: (!) 157/90  Pulse: 77  Resp: 16  Temp: (!) 97.5 F (36.4 C)  SpO2: 100%   Filed Weights   07/06/22 0919  Weight: 195 lb 8 oz (88.7 kg)   General appearance: alert, oriented, in no acute distress. Left breast: S.p adj radiation with skin desquamation  LABORATORY DATA:  I have reviewed the data as listed Lab Results  Component Value Date   WBC 6.8 04/01/2022   HGB 12.9 04/01/2022   HCT 37.6 04/01/2022   MCV 90.8 04/01/2022   PLT 400 04/01/2022   Lab Results  Component Value Date   NA 143 04/01/2022   K 3.4 (L) 04/01/2022   CL 106 04/01/2022   CO2 31 04/01/2022    RADIOGRAPHIC STUDIES: I have personally reviewed the radiological reports and agreed with the findings in the report.  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   Final pathology reviewed, significant for DCIS, abutting posterior margin.  DCIS is intermediate to high-grade. She went through re excision. She is now post adj radiation, healing well.  We have once again discussed about options for anti estrogen therapy. She would like to try anastrozole. We discussed adverse effects including post menopausal symptoms, vaginal dryness, bone loss. She will RTC in 3 months for SCP.  Benay Pike MD  According to pt, baseline bone density normal. I will try to obtain a copy of this. Total time spent: 20 minutes including history, physical exam, review of records, counseling and coordination  of care  All questions were answered. The patient knows to call the clinic with any problems, questions or concerns.    Benay Pike, MD 07/06/22

## 2022-07-08 ENCOUNTER — Other Ambulatory Visit: Payer: Self-pay | Admitting: Allergy

## 2022-07-08 ENCOUNTER — Other Ambulatory Visit: Payer: Self-pay | Admitting: Family Medicine

## 2022-07-08 DIAGNOSIS — J310 Chronic rhinitis: Secondary | ICD-10-CM

## 2022-07-09 NOTE — Radiation Completion Notes (Signed)
Patient Name: Rebecca [name], Rebecca [name] MRN: [id number] Date of Birth: 08/07/1955 Referring Physician: Grier Mitts, M.D. Date of Service: 2022-07-09 Radiation Oncologist: Teryl Lucy, M.D. [place] [facility] END OF TREATMENT NOTE     Diagnosis: D05.12 Intraductal carcinoma in situ of left breast Staging on 2022-04-01: Ductal carcinoma in situ (DCIS) of left breast T=cTis (DCIS), N=cN0, M=cM0 Intent: Curative     ==========DELIVERED PLANS==========  First Treatment Date: 2022-05-27 - Last Treatment Date: 2022-06-26   Plan Name: Breast_L Site: Breast, Left Technique: 3D Mode: Photon Dose Per Fraction: 2.67 Gy Prescribed Dose (Delivered / Prescribed): 40.05 Gy / 40.05 Gy Prescribed Fxs (Delivered / Prescribed): 15 / 15   Plan Name: Breast_L_Bst Site: Breast, Left Technique: 3D Mode: Photon Dose Per Fraction: 2 Gy Prescribed Dose (Delivered / Prescribed): 12 Gy / 12 Gy Prescribed Fxs (Delivered / Prescribed): 6 / 6     ==========ON TREATMENT VISIT DATES========== 2022-06-02, 2022-06-09, 2022-06-16, 2022-06-23     ==========UPCOMING VISITS==========       ==========APPENDIX - ON TREATMENT VISIT NOTES==========   See weekly On Treatment Notes is Epic for details.

## 2022-07-10 ENCOUNTER — Encounter: Payer: Self-pay | Admitting: Adult Health

## 2022-07-16 ENCOUNTER — Other Ambulatory Visit (HOSPITAL_COMMUNITY): Payer: Self-pay | Admitting: Orthopedic Surgery

## 2022-07-16 DIAGNOSIS — Z96651 Presence of right artificial knee joint: Secondary | ICD-10-CM

## 2022-07-24 ENCOUNTER — Encounter (HOSPITAL_COMMUNITY): Payer: Self-pay

## 2022-07-24 ENCOUNTER — Ambulatory Visit (HOSPITAL_COMMUNITY): Payer: 59

## 2022-07-24 ENCOUNTER — Ambulatory Visit (HOSPITAL_COMMUNITY): Admission: RE | Admit: 2022-07-24 | Payer: 59 | Source: Ambulatory Visit

## 2022-07-29 NOTE — Progress Notes (Signed)
  Radiation Oncology         [phone] ________________________________  Patient Name: Rebecca [name] MRN: [id number] DOB: 01/10/1956 Referring Physician: Grier Mitts Date of Service: 06/26/2022 [facility], [place]                                                        End Of Treatment Note  Diagnoses: D05.12-Intraductal carcinoma in situ of left breast  Cancer Staging:  Stage 0 (cTis (DCIS), cN0, cM0) Left Breast, Intermediate to high-grade DCIS, ER+ / PR+ / Her2 not assessed, s/p lumpectomy    Intent: Curative  Radiation Treatment Dates: 05/27/2022 through 06/26/2022 Site Technique Total Dose (Gy) Dose per Fx (Gy) Completed Fx Beam Energies  Breast, Left: Breast_L 3D 40.05/40.05 2.67 15/15 10X, 15X  Breast, Left: Breast_L_Bst 3D 12/12 2 6/6 6X, 15X   Narrative: The patient tolerated radiation therapy relatively well. During her final weekly treatment check on 06/23/22, the patient endorsed discomfort from the left areola to left axillary region, fatigue, and skin irritation beneath the breast. Physical exam performed on that same date showed some hyperpigmentation changes and erythema as well as dry desquamation to the left breast area.  No moist desquamation was appreciated.  Plan: The patient will follow-up with radiation oncology in one month .  ________________________________________________ -----------------------------------  Blair Promise, PhD, MD  This document serves as a record of services personally performed by Gery Pray, MD. It was created on his behalf by Roney Mans, a trained medical scribe. The creation of this record is based on the scribe's personal observations and the provider's statements to them. This document has been checked and approved by the attending provider.

## 2022-07-29 NOTE — Progress Notes (Signed)
Radiation Oncology         [phone] ________________________________  Name: KEONDRIA LAUR MRN: [id number]  Date: 07/30/2022  DOB: May 13, 1956  Follow-Up Visit Note  CC: Billie Ruddy, MD  Billie Ruddy, MD  No diagnosis found.  Diagnosis: Stage 0 (cTis (DCIS), cN0, cM0) Left Breast, Intermediate to high-grade DCIS, ER+ / PR+ / Her2 not assessed, s/p lumpectomy    Interval Since Last Radiation: 1 month and 3 days   Intent: Curative  Radiation Treatment Dates: 05/27/2022 through 06/26/2022 Site Technique Total Dose (Gy) Dose per Fx (Gy) Completed Fx Beam Energies  Breast, Left: Breast_L 3D 40.05/40.05 2.67 15/15 10X, 15X  Breast, Left: Breast_L_Bst 3D 12/12 2 6/6 6X, 15X   Narrative:  The patient returns today for routine follow-up. The patient tolerated radiation therapy relatively well. During her final weekly treatment check on 06/23/22, the patient endorsed discomfort from the left areola to left axillary region, fatigue, and skin irritation beneath the breast. Physical exam performed on that same date showed some hyperpigmentation changes and erythema as well as dry desquamation to the left breast area.  No moist desquamation was appreciated.   Since completing XRT, the patient followed up with Dr. Chryl Heck on 07/06/22. During which time, the patient opted to proceed with antiestrogen therapy consisting of anastrozole. She will return to Dr. Chryl Heck in approximately 2 months for routine follow-up and to review her survivorship care plan.    ***                            Allergies:  is allergic to hydrocodone and sulfa antibiotics.  Meds: Current Outpatient Medications  Medication Sig Dispense Refill   albuterol (PROVENTIL) (2.5 MG/3ML) 0.083% nebulizer solution Take 3 mLs (2.5 mg total) by nebulization every 4 (four) hours as needed for wheezing or shortness of breath. 75 mL 1   albuterol (VENTOLIN HFA) 108 (90 Base) MCG/ACT inhaler Use 2 puffs every 4 hours as needed  for cough or wheeze.  May use  2 puffs 10-20 minutes prior to exercise. 18 g 1   ALVESCO 160 MCG/ACT inhaler Inhale 1 puff into the lungs 2 (two) times daily. 1 each 2   anastrozole (ARIMIDEX) 1 MG tablet Take 1 tablet (1 mg total) by mouth daily. 90 tablet 3   gabapentin (NEURONTIN) 300 MG capsule Take 300 mg by mouth 2 (two) times daily.     hydrochlorothiazide (HYDRODIURIL) 12.5 MG tablet TAKE 1 TABLET BY MOUTH EVERY DAY 90 tablet 1   lisinopril (ZESTRIL) 30 MG tablet TAKE 1 TABLET BY MOUTH EVERY DAY 90 tablet 1   montelukast (SINGULAIR) 10 MG tablet TAKE 1 TABLET BY MOUTH EVERYDAY AT BEDTIME 30 tablet 0   Multiple Vitamins-Minerals (MULTIVITAMIN WITH MINERALS) tablet Take 1 tablet by mouth daily with breakfast.     vitamin B-12 (CYANOCOBALAMIN) 100 MCG tablet Take 100 mcg by mouth daily.     No current facility-administered medications for this encounter.    Physical Findings: The patient is in no acute distress. Patient is alert and oriented.  vitals were not taken for this visit. .  No significant changes. Lungs are clear to auscultation bilaterally. Heart has regular rate and rhythm. No palpable cervical, supraclavicular, or axillary adenopathy. Abdomen soft, non-tender, normal bowel sounds.  Right Breast: no palpable mass, nipple discharge or bleeding. Left Breast: ***  Lab Findings: Lab Results  Component Value Date   WBC 6.8 04/01/2022  HGB 12.9 04/01/2022   HCT 37.6 04/01/2022   MCV 90.8 04/01/2022   PLT 400 04/01/2022    Radiographic Findings: No results found.  Impression: Stage 0 (cTis (DCIS), cN0, cM0) Left Breast, Intermediate to high-grade DCIS, ER+ / PR+ / Her2 not assessed, s/p lumpectomy     The patient is recovering from the effects of radiation.  ***  Plan:  ***   *** minutes of total time was spent for this patient encounter, including preparation, face-to-face counseling with the patient and coordination of care, physical exam, and documentation of  the encounter. ____________________________________  Blair Promise, PhD, MD  This document serves as a record of services personally performed by Gery Pray, MD. It was created on his behalf by Roney Mans, a trained medical scribe. The creation of this record is based on the scribe's personal observations and the provider's statements to them. This document has been checked and approved by the attending provider.

## 2022-07-30 ENCOUNTER — Encounter: Payer: Self-pay | Admitting: Radiation Oncology

## 2022-07-30 ENCOUNTER — Ambulatory Visit
Admission: RE | Admit: 2022-07-30 | Discharge: 2022-07-30 | Disposition: A | Discharge status: Home / Self Care | Payer: 59 | Source: Ambulatory Visit | Attending: Radiation Oncology | Admitting: Radiation Oncology

## 2022-07-30 DIAGNOSIS — Z7951 Long term (current) use of inhaled steroids: Secondary | ICD-10-CM | POA: Insufficient documentation

## 2022-07-30 DIAGNOSIS — Z923 Personal history of irradiation: Secondary | ICD-10-CM | POA: Insufficient documentation

## 2022-07-30 DIAGNOSIS — Z170 Estrogen receptor positive status [ER+]: Secondary | ICD-10-CM | POA: Diagnosis not present

## 2022-07-30 DIAGNOSIS — Z79811 Long term (current) use of aromatase inhibitors: Secondary | ICD-10-CM | POA: Insufficient documentation

## 2022-07-30 DIAGNOSIS — D0512 Intraductal carcinoma in situ of left breast: Secondary | ICD-10-CM | POA: Diagnosis present

## 2022-07-30 NOTE — Progress Notes (Signed)
Rebecca [name] is here today for follow up post radiation to the breast.   Breast Side:Left   They completed their radiation on: 06/26/22  Does the patient complain of any of the following: Post radiation skin issues:  No Breast Tenderness: yes, reports intermittent pain.  Breast Swelling: No Lymphadema: No Range of Motion limitations: Yes, Patient states, "  I don't even use my left arm because of pain." Patient has not had any PT.  Fatigue post radiation: Yes Appetite good/fair/poor: good  Additional comments if applicable:   BP (!) [id number] (BP Location: Left Arm, Patient Position: Sitting)   Pulse 77   Temp 98.3 F (36.8 C) (Temporal)   Resp 18   Ht 5' 3"$  (1.6 m)   Wt 195 lb 2 oz (88.5 kg)   SpO2 98%   BMI 34.56 kg/m

## 2022-08-12 ENCOUNTER — Ambulatory Visit (INDEPENDENT_AMBULATORY_CARE_PROVIDER_SITE_OTHER): Payer: 59 | Admitting: Family Medicine

## 2022-08-12 ENCOUNTER — Encounter: Payer: Self-pay | Admitting: Family Medicine

## 2022-08-12 VITALS — BP 124/70 | HR 82 | Temp 98.0°F | Resp 16 | Ht 63.0 in | Wt 189.1 lb

## 2022-08-12 DIAGNOSIS — A084 Viral intestinal infection, unspecified: Secondary | ICD-10-CM | POA: Diagnosis not present

## 2022-08-12 DIAGNOSIS — R110 Nausea: Secondary | ICD-10-CM | POA: Diagnosis not present

## 2022-08-12 DIAGNOSIS — E876 Hypokalemia: Secondary | ICD-10-CM

## 2022-08-12 DIAGNOSIS — I10 Essential (primary) hypertension: Secondary | ICD-10-CM

## 2022-08-12 MED ORDER — ONDANSETRON 4 MG PO TBDP: 4.0000 mg | ORAL_TABLET | Freq: Three times a day (TID) | ORAL | 0 refills | Status: AC | PRN

## 2022-08-12 NOTE — Addendum Note (Signed)
Addended by: Rosalyn Gess D on: 08/12/2022 11:17 AM   Modules accepted: Orders

## 2022-08-12 NOTE — Patient Instructions (Signed)
A few things to remember from today's visit:  Hypokalemia - Plan: Basic metabolic panel  Viral gastroenteritis - Plan: Basic metabolic panel  Nausea without vomiting - Plan: ondansetron (ZOFRAN-ODT) 4 MG disintegrating tablet  Stop Hydrochlorothiazide for now and can resume after diarrhea resolves. Monitor blood pressure daily.  Do not use My Chart to request refills or for acute issues that need immediate attention. If you send a my chart message, it may take a few days to be addressed, specially if I am not in the office.  Please be sure medication list is accurate. If a new problem present, please set up appointment sooner than planned today.

## 2022-08-12 NOTE — Progress Notes (Signed)
ACUTE VISIT Chief Complaint  Patient presents with   Diarrhea    Started at 2:30 in the morning on Monday, having a little nausea    HPI: [name] is a 67 y.o. female with past medical history significant for hypertension, OSA, asthma, hyperlipidemia, left breast cancer, and vitamin D deficiency here today complaining of new-onset diarrhea as described above.  Diarrhea  This is a new problem. The current episode started in the past 7 days. The problem occurs 2 to 4 times per day. The problem has been unchanged. The stool consistency is described as Watery. The patient states that diarrhea does not awaken her from sleep. Associated symptoms include myalgias. Pertinent negatives include no abdominal pain, arthralgias, bloating, chills, coughing, fever, headaches, increased  flatus, sweats, vomiting or weight loss. Nothing aggravates the symptoms. There are no known risk factors. She has tried bismuth subsalicylate for the symptoms. The treatment provided no relief.   She reports no known sick contacts with similar symptoms, recent travel,or abx treatment. She mentions fasting for three days on water and juice last week, followed by a church dinner on Sunday evening, but is unaware of any other attendees experiencing illness.  No new medications. She has had one bowel movement today and four yesterday, with no mucus or blood in the stool.  She denies abdominal pain but reports mild nausea without vomiting.  Symptoms exacerbated by oral intake.  She also reports experiencing body aches and fatigue, as well as a transient sore throat on Monday.  She has been taking Pepto-Bismol for symptom management without significant improvement.   Hypertension on HCTZ 25 mg daily and lisinopril 30 mg daily. She is not monitoring BP at home. History of hypokalemia. Lab Results  Component Value Date   CREATININE 0.92 04/01/2022   BUN 14 04/01/2022   NA 143 04/01/2022   K 3.4 (L) 04/01/2022    CL 106 04/01/2022   CO2 31 04/01/2022   Review of Systems  Constitutional:  Negative for chills, fever and weight loss.  HENT:  Negative for congestion and rhinorrhea.   Respiratory:  Negative for cough, shortness of breath and wheezing.   Cardiovascular:  Negative for palpitations and leg swelling.  Gastrointestinal:  Positive for diarrhea. Negative for abdominal pain, bloating, flatus and vomiting.  Endocrine: Negative for cold intolerance and heat intolerance.  Genitourinary:  Negative for decreased urine volume, dysuria and hematuria.  Musculoskeletal:  Positive for myalgias. Negative for arthralgias.  Skin:  Negative for rash.  Neurological:  Negative for syncope and headaches.  See other pertinent positives and negatives in HPI.  Current Outpatient Medications on File Prior to Visit  Medication Sig Dispense Refill   albuterol (PROVENTIL) (2.5 MG/3ML) 0.083% nebulizer solution Take 3 mLs (2.5 mg total) by nebulization every 4 (four) hours as needed for wheezing or shortness of breath. 75 mL 1   albuterol (VENTOLIN HFA) 108 (90 Base) MCG/ACT inhaler Use 2 puffs every 4 hours as needed for cough or wheeze.  May use  2 puffs 10-20 minutes prior to exercise. 18 g 1   ALVESCO 160 MCG/ACT inhaler Inhale 1 puff into the lungs 2 (two) times daily. 1 each 2   anastrozole (ARIMIDEX) 1 MG tablet Take 1 tablet (1 mg total) by mouth daily. 90 tablet 3   gabapentin (NEURONTIN) 300 MG capsule Take 300 mg by mouth 2 (two) times daily.     hydrochlorothiazide (HYDRODIURIL) 12.5 MG tablet TAKE 1 TABLET BY MOUTH EVERY DAY 90 tablet  1   lisinopril (ZESTRIL) 30 MG tablet TAKE 1 TABLET BY MOUTH EVERY DAY 90 tablet 1   montelukast (SINGULAIR) 10 MG tablet TAKE 1 TABLET BY MOUTH EVERYDAY AT BEDTIME 30 tablet 0   Multiple Vitamins-Minerals (MULTIVITAMIN WITH MINERALS) tablet Take 1 tablet by mouth daily with breakfast.     vitamin B-12 (CYANOCOBALAMIN) 100 MCG tablet Take 100 mcg by mouth daily.     No  current facility-administered medications on file prior to visit.   Past Medical History:  Diagnosis Date   Arthritis    Asthma    Blindness, legal    L EYE   Breast cancer ([place])    GERD (gastroesophageal reflux disease)    Hearing loss    History of kidney stones    Hyperlipidemia    Hypertension    Lapband May 2013 11/18/2011   Migraines    Obesity, Class III, BMI 40-49.9 (morbid obesity) ([place]) 06/03/2011   Pre-diabetes    Sleep apnea    uses c-pap   Vertigo    Allergies  Allergen Reactions   Hydrocodone Hives, Shortness Of Breath and Itching   Sulfa Antibiotics Hives   Social History   Socioeconomic History   Marital status: Married    Spouse name: Not on file   Number of children: Not on file   Years of education: Not on file   Highest education level: Not on file  Occupational History   Not on file  Tobacco Use   Smoking status: Former    Packs/day: 0.50    Years: 15.00    Total pack years: 7.50    Types: Cigarettes    Quit date: 06/02/1994    Years since quitting: 28.2   Smokeless tobacco: Never  Vaping Use   Vaping Use: Never used  Substance and Sexual Activity   Alcohol use: No   Drug use: No   Sexual activity: Not on file    Comment: hyst  Other Topics Concern   Not on file  Social History Narrative   Work or School: works part time with [employer], 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       Home Situation: lives with husband      Spiritual Beliefs: Christian  - Education administrator, Dentist      Lifestyle: wt watchers 2019, starting to exercise         Social Determinants of Health   Financial Resource Strain: [place]  (04/01/2022)   Overall Financial Resource Strain (CARDIA)    Difficulty of Paying Living Expenses: Not very hard  Food Insecurity: No Food Insecurity (04/01/2022)   Hunger Vital Sign    Worried About Running Out of Food in the Last Year: Never true    [place] in the Last Year: Never true   Transportation Needs: No Transportation Needs (04/01/2022)   PRAPARE - Hydrologist (Medical): No    Lack of Transportation (Non-Medical): No  Physical Activity: Inactive (10/06/2021)   Exercise Vital Sign    Days of Exercise per Week: 0 days    Minutes of Exercise per Session: 0 min  Stress: No Stress Concern Present (10/06/2021)   [place]    Feeling of Stress : Not at all  Social Connections: Not on file   Vitals:   08/12/22 0814  BP: 124/70  Pulse: 82  Resp: 16  Temp: 98 F (36.7 C)  SpO2: 96%  Body mass index is 33.5 kg/m.  Physical Exam Vitals and nursing note reviewed.  Constitutional:      General: She is not in acute distress.    Appearance: She is well-developed.  HENT:     Head: Normocephalic and atraumatic.     Mouth/Throat:     Mouth: Mucous membranes are dry.     Pharynx: Oropharynx is clear.  Eyes:     Conjunctiva/sclera: Conjunctivae normal.  Cardiovascular:     Rate and Rhythm: Normal rate and regular rhythm.     Heart sounds: No murmur heard. Pulmonary:     Effort: Pulmonary effort is normal. No respiratory distress.     Breath sounds: Normal breath sounds.  Abdominal:     General: There is no abdominal bruit.     Palpations: Abdomen is soft. There is no hepatomegaly or mass.     Tenderness: There is no abdominal tenderness.  Skin:    General: Skin is warm.     Findings: No erythema or rash.  Neurological:     General: No focal deficit present.     Mental Status: She is alert and oriented to person, place, and time.     Gait: Gait normal.  Psychiatric:        Mood and Affect: Mood and affect normal.   ASSESSMENT AND PLAN: Ms. Peter was seen today for diarrhea.  Viral gastroenteritis Most likely viral etiology, we discussed differential diagnosis. Symptomatic treatment recommended for now. I do not think work up is needed at this time but  needs to be considered if problem is persistent or new symptoms present. OTC Imodium could be used but I do not recommend unless 6 or more stools daily. Oral hydration,clear fluids or Pedialyte. Bland and light diet if tolerated. Clearly instructed about warning signs. F/U as needed.  -     Basic metabolic panel; Future  Nausea without vomiting Symptomatic treatment with sublingual ondansetron. Monitor for new symptoms. Small sips of fluids and portions of food throughout the day as tolerated.  -     Ondansetron; Take 1 tablet (4 mg total) by mouth every 8 (eight) hours as needed for up to 3 days for nausea or vomiting.  Dispense: 9 tablet; Refill: 0  Essential hypertension, benign BP adequately controlled. Because of mild dehydration on examination today, recommend holding HCTZ until diarrhea resolves. Continue lisinopril 30 mg daily. Instructed to monitor BP daily.  Hypokalemia Mild. Current acute illness could aggravate problem, BMP ordered today. Recommend holding HCTZ for now.  -     Basic metabolic panel; Future  Return if symptoms worsen or fail to improve.  Marylen Zuk G. [place], MD  [facility]. [place] office.

## 2022-09-22 ENCOUNTER — Encounter (HOSPITAL_COMMUNITY): Payer: 59

## 2022-09-22 ENCOUNTER — Ambulatory Visit (HOSPITAL_COMMUNITY): Payer: 59

## 2022-09-28 ENCOUNTER — Encounter (HOSPITAL_COMMUNITY)
Admission: RE | Admit: 2022-09-28 | Discharge: 2022-09-28 | Disposition: A | Discharge status: Home / Self Care | Payer: 59 | Source: Ambulatory Visit | Attending: Orthopedic Surgery | Admitting: Orthopedic Surgery

## 2022-09-28 DIAGNOSIS — Z96651 Presence of right artificial knee joint: Secondary | ICD-10-CM | POA: Diagnosis present

## 2022-09-28 MED ORDER — TECHNETIUM TC 99M MEDRONATE IV KIT
19.8000 | PACK | Freq: Once | INTRAVENOUS | Status: AC | PRN
  Administered 2022-09-28: 19.8 via INTRAVENOUS

## 2022-10-05 ENCOUNTER — Telehealth: Payer: Self-pay | Admitting: Family Medicine

## 2022-10-05 NOTE — Telephone Encounter (Signed)
Contacted Rebecca [name] to schedule their annual wellness visit. Appointment made for 10/13/22.  Rebecca [name] AWV direct phone # [phone]   Due to schedule change 4/29 awv appt r/s to 4/30 Pt aware of appt date/time change

## 2022-10-06 ENCOUNTER — Inpatient Hospital Stay: Payer: 59 | Attending: Hematology and Oncology | Admitting: Adult Health

## 2022-10-06 ENCOUNTER — Other Ambulatory Visit: Payer: Self-pay

## 2022-10-06 ENCOUNTER — Encounter: Payer: Self-pay | Admitting: Adult Health

## 2022-10-06 VITALS — BP 146/66 | HR 77 | Temp 97.7°F | Resp 21 | Ht 63.0 in | Wt 195.3 lb

## 2022-10-06 DIAGNOSIS — Z803 Family history of malignant neoplasm of breast: Secondary | ICD-10-CM | POA: Diagnosis not present

## 2022-10-06 DIAGNOSIS — F419 Anxiety disorder, unspecified: Secondary | ICD-10-CM | POA: Insufficient documentation

## 2022-10-06 DIAGNOSIS — Z170 Estrogen receptor positive status [ER+]: Secondary | ICD-10-CM | POA: Diagnosis not present

## 2022-10-06 DIAGNOSIS — D0512 Intraductal carcinoma in situ of left breast: Secondary | ICD-10-CM | POA: Diagnosis not present

## 2022-10-06 DIAGNOSIS — F32A Depression, unspecified: Secondary | ICD-10-CM | POA: Insufficient documentation

## 2022-10-06 DIAGNOSIS — Z79811 Long term (current) use of aromatase inhibitors: Secondary | ICD-10-CM | POA: Diagnosis not present

## 2022-10-06 DIAGNOSIS — Z800 Family history of malignant neoplasm of digestive organs: Secondary | ICD-10-CM | POA: Diagnosis not present

## 2022-10-06 DIAGNOSIS — Z87891 Personal history of nicotine dependence: Secondary | ICD-10-CM | POA: Diagnosis not present

## 2022-10-06 NOTE — Progress Notes (Signed)
SURVIVORSHIP VISIT:   BRIEF ONCOLOGIC HISTORY:  Oncology History  Ductal carcinoma in situ (DCIS) of left breast  03/17/2022 Mammogram   New solid left breast mass identified at 11:30, 2 cm from the nipple. Targeted ultrasound is performed, showing a mass at 11:30, 2 cm from the nipple measuring 7 x 3 x 8 mm, solid in appearance. No axillary adenopathy.   03/27/2022 Initial Diagnosis   Ductal carcinoma in situ (DCIS) of left breast    Pathology Results   Pathology from left breast showed DCIS, intermediate to high nuclear grade, solid and cribriform types with focal necrosis.  Negative for invasive carcinoma.  Negative for microcalcifications.  Prognostics showed ER 95% positive strong staining PR 40% positive strong staining.   05/27/2022 - 06/26/2022 Radiation Therapy   Plan Name: Breast_L Site: Breast, Left Technique: 3D Mode: Photon Dose Per Fraction: 2.67 Gy Prescribed Dose (Delivered / Prescribed): 40.05 Gy / 40.05 Gy Prescribed Fxs (Delivered / Prescribed): 15 / 15   Plan Name: Breast_L_Bst Site: Breast, Left Technique: 3D Mode: Photon Dose Per Fraction: 2 Gy Prescribed Dose (Delivered / Prescribed): 12 Gy / 12 Gy Prescribed Fxs (Delivered / Prescribed): 6 / 6   07/2022 -  Anti-estrogen oral therapy   Anastrozole     INTERVAL HISTORY:  Rebecca [name] to review her survivorship care plan detailing her treatment course for breast cancer, as well as monitoring long-term side effects of that treatment, education regarding health maintenance, screening, and overall wellness and health promotion.     Overall, Rebecca [name] reports feeling moderately well.  She notes that she has been irritable and depressed for the past 2 months.  She also has difficulty sleeping.  She takes the anastrozole at night.  When she was taking it during the day she had an increased amount of night sweats and then she started taking it at night and now she cannot sleep.  With her depression she notes  increased anhedonia and thoughts of hopelessness and feeling like she would be better off dead.  Although she has these feelings she says she would never act on them and has not made out a plan to harm herself.  REVIEW OF SYSTEMS:  Review of Systems  Constitutional:  Positive for fatigue. Negative for appetite change, chills, fever and unexpected weight change.  HENT:   Negative for hearing loss, lump/mass and trouble swallowing.   Eyes:  Negative for eye problems and icterus.  Respiratory:  Negative for chest tightness, cough and shortness of breath.   Cardiovascular:  Negative for chest pain, leg swelling and palpitations.  Gastrointestinal:  Negative for abdominal distention, abdominal pain, constipation, diarrhea, nausea and vomiting.  Endocrine: Negative for hot flashes.  Genitourinary:  Negative for difficulty urinating.   Musculoskeletal:  Positive for arthralgias.  Skin:  Negative for itching and rash.  Neurological:  Negative for dizziness, extremity weakness, headaches and numbness.  Hematological:  Negative for adenopathy. Does not bruise/bleed easily.  Psychiatric/Behavioral:  Positive for depression and sleep disturbance. The patient is nervous/anxious.   Breast: Denies any new nodularity, masses, tenderness, nipple changes, or nipple discharge.        PAST MEDICAL/SURGICAL HISTORY:  Past Medical History:  Diagnosis Date   Arthritis    Asthma    Blindness, legal    L EYE   Breast cancer ([facility])    GERD (gastroesophageal reflux disease)    Hearing loss    History of kidney stones    Hyperlipidemia    Hypertension  Lapband May 2013 11/18/2011   Migraines    Obesity, Class III, BMI 40-49.9 (morbid obesity) ([facility]) 06/03/2011   Pre-diabetes    Sleep apnea    uses c-pap   Vertigo    Past Surgical History:  Procedure Laterality Date   ABDOMINAL HYSTERECTOMY  2006   fibroids   BREAST LUMPECTOMY WITH RADIOACTIVE SEED LOCALIZATION Left 04/21/2022   Procedure: LEFT  BREAST LUMPECTOMY WITH RADIOACTIVE SEED LOCALIZATION;  Surgeon: Manus Rudd, MD;  Location: [facility];  Service: General;  Laterality: Left;   BREATH TEK H PYLORI  08/17/2011   Procedure: BREATH TEK H PYLORI;  Surgeon: Valarie Merino, MD;  Location: [facility];  Service: General;  Laterality: N/A;  PATIENT WILL COME AT 0715   COLONOSCOPY  2008   @ [facility]    EYE SURGERY     Patient unsure of surgery date. Left eye   KNEE SURGERY  1992   right   LAPAROSCOPIC GASTRIC BANDING  10/20/2011   Procedure: LAPAROSCOPIC GASTRIC BANDING;  Surgeon: Valarie Merino, MD;  Location: [facility];  Service: General;  Laterality: N/A;   RE-EXCISION OF BREAST LUMPECTOMY Left 05/05/2022   Procedure: RE-EXCISION OF LEFT BREAST LUMPECTOMY SITE POSTERIOR MARGIN;  Surgeon: Manus Rudd, MD;  Location: [facility];  Service: General;  Laterality: Left;   TOTAL KNEE ARTHROPLASTY Right 08/18/2021   Procedure: TOTAL KNEE ARTHROPLASTY;  Surgeon: Ollen Gross, MD;  Location: [facility];  Service: Orthopedics;  Laterality: Right;     ALLERGIES:  Allergies  Allergen Reactions   Hydrocodone Hives, Shortness Of Breath and Itching   Sulfa Antibiotics Hives     CURRENT MEDICATIONS:  Outpatient Encounter Medications as of 10/06/2022  Medication Sig   albuterol (PROVENTIL) (2.5 MG/3ML) 0.083% nebulizer solution Take 3 mLs (2.5 mg total) by nebulization every 4 (four) hours as needed for wheezing or shortness of breath.   albuterol (VENTOLIN HFA) 108 (90 Base) MCG/ACT inhaler Use 2 puffs every 4 hours as needed for cough or wheeze.  May use  2 puffs 10-20 minutes prior to exercise.   ALVESCO 160 MCG/ACT inhaler Inhale 1 puff into the lungs 2 (two) times daily.   anastrozole (ARIMIDEX) 1 MG tablet Take 1 tablet (1 mg total) by mouth daily.   gabapentin (NEURONTIN) 300 MG capsule Take 300 mg by mouth 2 (two) times daily.   hydrochlorothiazide (HYDRODIURIL) 12.5 MG tablet TAKE 1 TABLET BY MOUTH EVERY  DAY   lisinopril (ZESTRIL) 30 MG tablet TAKE 1 TABLET BY MOUTH EVERY DAY   montelukast (SINGULAIR) 10 MG tablet TAKE 1 TABLET BY MOUTH EVERYDAY AT BEDTIME   Multiple Vitamins-Minerals (MULTIVITAMIN WITH MINERALS) tablet Take 1 tablet by mouth daily with breakfast.   vitamin B-12 (CYANOCOBALAMIN) 100 MCG tablet Take 100 mcg by mouth daily.   No facility-administered encounter medications on file as of 10/06/2022.     ONCOLOGIC FAMILY HISTORY:  Family History  Problem Relation Age of Onset   Breast cancer Mother 37   Alcohol abuse Father    Heart disease Father 17       MI   Hypertension Father    Colon cancer Brother        dx after 26   Breast cancer Daughter 90      SOCIAL HISTORY:  Social History   Socioeconomic History   Marital status: Married    Spouse name: Not on file   Number of children: Not on file   Years of education: Not on  file   Highest education level: Not on file  Occupational History   Not on file  Tobacco Use   Smoking status: Former    Packs/day: 0.50    Years: 15.00    Additional pack years: 0.00    Total pack years: 7.50    Types: Cigarettes    Quit date: 06/02/1994    Years since quitting: 28.3   Smokeless tobacco: Never  Vaping Use   Vaping Use: Never used  Substance and Sexual Activity   Alcohol use: No   Drug use: No   Sexual activity: Not on file    Comment: hyst  Other Topics Concern   Not on file  Social History Narrative   Work or School: works part time with [employer] - 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       Home Situation: lives with husband      Spiritual Beliefs: Christian  - Gaffer, Higher education careers adviser      Lifestyle: wt watchers 2019, starting to exercise         Social Determinants of Health   Financial Resource Strain: Low Risk  (04/01/2022)   Overall Financial Resource Strain (CARDIA)    Difficulty of Paying Living Expenses: Not very hard  Food Insecurity: No Food Insecurity  (04/01/2022)   Hunger Vital Sign    Worried About Running Out of Food in the Last Year: Never true    Ran Out of Food in the Last Year: Never true  Transportation Needs: No Transportation Needs (04/01/2022)   PRAPARE - Administrator, Civil Service (Medical): No    Lack of Transportation (Non-Medical): No  Physical Activity: Inactive (10/06/2021)   Exercise Vital Sign    Days of Exercise per Week: 0 days    Minutes of Exercise per Session: 0 min  Stress: No Stress Concern Present (10/06/2021)   [employer] of Occupational Health - Occupational Stress Questionnaire    Feeling of Stress : Not at all  Social Connections: Not on file  Intimate Partner Violence: Not on file     OBSERVATIONS/OBJECTIVE:  BP (!) 146/66 (BP Location: Left Arm, Patient Position: Sitting) Comment: nurse notfied.  Pulse 77   Temp 97.7 F (36.5 C) (Temporal)   Resp (!) 21 Comment: nurse notfied.  Ht 5\' 3"  (1.6 m)   Wt 195 lb 4.8 oz (88.6 kg)   SpO2 98%   BMI 34.60 kg/m  GENERAL: Patient is a well appearing female in no acute distress HEENT:  Sclerae anicteric.  Oropharynx clear and moist. No ulcerations or evidence of oropharyngeal candidiasis. Neck is supple.  NODES:  No cervical, supraclavicular, or axillary lymphadenopathy palpated.  BREAST EXAM: Left breast status post lumpectomy and radiation no sign of local recurrence right breast is benign. LUNGS:  Clear to auscultation bilaterally.  No wheezes or rhonchi. HEART:  Regular rate and rhythm. No murmur appreciated. ABDOMEN:  Soft, nontender.  Positive, normoactive bowel sounds. No organomegaly palpated. MSK:  No focal spinal tenderness to palpation. Full range of motion bilaterally in the upper extremities. EXTREMITIES:  No peripheral edema.   SKIN:  Clear with no obvious rashes or skin changes. No nail dyscrasia. NEURO:  Nonfocal. Well oriented.  Appropriate affect.  LABORATORY DATA:  None for this visit.  DIAGNOSTIC IMAGING:   None for this visit.      ASSESSMENT AND PLAN:  Ms.. [name] is a pleasant 67 y.o. female with Stage 0 left breast DCIS, ER+/PR+, diagnosed in October  2023, treated with lumpectomy, adjuvant radiation therapy, and anti-estrogen therapy with anastrozole beginning in 2024.  She presents to the Survivorship Clinic for our initial meeting and routine follow-up post-completion of treatment for breast cancer.    1. Stage 0 left breast cancer:  Rebecca [name] is continuing to recover from definitive treatment for breast cancer. She will follow-up with her medical oncologist, Dr. Al Pimple in 6 months with history and physical exam per surveillance protocol.  I recommended that she stop taking anastrozole, see #2.  Her mammogram is due 02/2023; orders placed today. Today, a comprehensive survivorship care plan and treatment summary was reviewed with the patient today detailing her breast cancer diagnosis, treatment course, potential late/long-term effects of treatment, appropriate follow-up care with recommendations for the future, and patient education resources.  A copy of this summary, along with a letter will be sent to the patient's primary care provider via mail/fax/In Basket message after today's visit.    2.  Anxiety and depression: This is significantly worsened and decreased her quality of life since starting the anastrozole.  Due to this I recommended that she discontinue the anastrozole.  We will see her back in 4 weeks to follow-up on this and discuss different antiestrogen therapies.  She has no history of blood clots and tamoxifen at a lower dose may be a better fit for her considering she has noninvasive breast cancer.  3. Bone health:  Given Rebecca [name]'s age/history of breast cancer and her current treatment regimen including anti-estrogen therapy with anastrozole, she is at risk for bone demineralization.  Her last DEXA scan was 03/03/2022 and was normal. She was given education on specific activities  to promote bone health.  Considering we are taking her off the anastrozole and considering starting her on tamoxifen we will hold off on ordering further bone density testing.  Within normal bone density test such as hers she may not need to repeat bone density testing again for 5 to 10 years.  4. Cancer screening:  Due to Rebecca [name]'s history and her age, she should receive screening for skin cancers, colon cancer, and gynecologic cancers.  The information and recommendations are listed on the patient's comprehensive care plan/treatment summary and were reviewed in detail with the patient.    5. Health maintenance and wellness promotion: Rebecca [name] was encouraged to consume 5-7 servings of fruits and vegetables per day. We reviewed the "Nutrition Rainbow" handout.  She was also encouraged to engage in moderate to vigorous exercise for 30 minutes per day most days of the week.  She was instructed to limit her alcohol consumption and continue to abstain from tobacco use.     6. Support services/counseling: It is not uncommon for this period of the patient's cancer care trajectory to be one of many emotions and stressors. She was given information regarding our available services and encouraged to contact me with any questions or for help enrolling in any of our support group/programs.    Follow up instructions:    -Return to cancer center in 4 weeks for follow-up -Mammogram due in September 2024 orders placed today -She is welcome to return back to the Survivorship Clinic at any time; no additional follow-up needed at this time.  -Consider referral back to survivorship as a long-term survivor for continued surveillance  The patient was provided an opportunity to ask questions and all were answered. The patient agreed with the plan and demonstrated an understanding of the instructions.   Total encounter time:45 minutes*in face-to-face  visit time, chart review, lab review, care coordination, order  entry, and documentation of the encounter time.    Lillard Anes, NP 10/06/22 9:19 AM Medical Oncology and Hematology [facility] [address] [place], [place] [address] Tel. [phone]    Fax. [phone]  *Total Encounter Time as defined by the Centers for Medicare and Medicaid Services includes, in addition to the face-to-face time of a patient visit (documented in the note above) non-face-to-face time: obtaining and reviewing outside history, ordering and reviewing medications, tests or procedures, care coordination (communications with other health care professionals or caregivers) and documentation in the medical record.

## 2022-10-12 ENCOUNTER — Ambulatory Visit: Payer: Medicare Other

## 2022-10-13 ENCOUNTER — Ambulatory Visit (INDEPENDENT_AMBULATORY_CARE_PROVIDER_SITE_OTHER): Payer: 59 | Admitting: Family Medicine

## 2022-10-13 ENCOUNTER — Encounter: Payer: Self-pay | Admitting: Family Medicine

## 2022-10-13 DIAGNOSIS — Z0000 Encounter for general adult medical examination without abnormal findings: Secondary | ICD-10-CM

## 2022-10-13 NOTE — Patient Instructions (Addendum)
I really enjoyed getting to talk with you today! I am available on Tuesdays and Thursdays for virtual visits if you have any questions or concerns, or if I can be of any further assistance.   CHECKLIST FROM ANNUAL WELLNESS VISIT:  -Follow up (please call to schedule if not scheduled after visit):   -yearly for annual wellness visit with primary care office  Here is a list of your preventive care/health maintenance measures and the plan for each if any are due:  PLAN For any measures below that may be due:  -can get the vaccines at the pharmacy, please send [place] a copy when you do.  Health Maintenance  Topic Date Due   DTaP/Tdap/Td (1 - Tdap) Never done   Zoster Vaccines- Shingrix (1 of 2) Never done   Pneumonia Vaccine 52+ Years old (2 of 2 - PCV) 08/16/2020   COVID-19 Vaccine (5 - 2023-24 season) 10/13/2023 (Originally 03/04/2022)   INFLUENZA VACCINE  01/14/2023   Medicare Annual Wellness (AWV)  10/13/2023   MAMMOGRAM  04/01/2024   COLONOSCOPY (Pts 45-75yrs Insurance coverage will need to be confirmed)  08/07/2027   DEXA SCAN  Completed   Hepatitis C Screening  Completed   HPV VACCINES  Aged Out    -See a dentist at least yearly  -Get your eyes checked and then per your eye specialist's recommendations  -Other issues addressed today:   -I have included below further information regarding a healthy whole foods based diet, physical activity guidelines for adults, stress management and opportunities for social connections. I hope you find this information useful.   -----------------------------------------------------------------------------------------------------------------------------------------------------------------------------------------------------------------------------------------------------------  NUTRITION: -eat real food: lots of colorful vegetables (half the plate) and fruits -5-7 servings of vegetables and fruits per day (fresh or steamed is best), exp. 2  servings of vegetables with lunch and dinner and 2 servings of fruit per day. Berries and greens such as kale and collards are great choices.  -consume on a regular basis: whole grains (make sure first ingredient on label contains the word "whole"), fresh fruits, fish, nuts, seeds, healthy oils (such as olive oil, avocado oil, grape seed oil) -may eat small amounts of dairy and lean meat on occasion, but avoid processed meats such as ham, bacon, lunch meat, etc. -drink water -try to avoid fast food and pre-packaged foods, processed meat -most experts advise limiting sodium to < 2300mg  per day, should limit further is any chronic conditions such as high blood pressure, heart disease, diabetes, etc. The American Heart Association advised that < 1500mg  is is ideal -try to avoid foods that contain any ingredients with names you do not recognize  -try to avoid sugar/sweets (except for the natural sugar that occurs in fresh fruit) -try to avoid sweet drinks -try to avoid white rice, white bread, pasta (unless whole grain), white or yellow potatoes  EXERCISE GUIDELINES FOR ADULTS: -if you wish to increase your physical activity, do so gradually and with the approval of your doctor -STOP and seek medical care immediately if you have any chest pain, chest discomfort or trouble breathing when starting or increasing exercise  -move and stretch your body, legs, feet and arms when sitting for long periods -Physical activity guidelines for optimal health in adults: -least 150 minutes per week of aerobic exercise (can talk, but not sing) once approved by your doctor, 20-30 minutes of sustained activity or two 10 minute episodes of sustained activity every day.  -resistance training at least 2 days per week if approved by your doctor -  balance exercises 3+ days per week:   Stand somewhere where you have something sturdy to hold onto if you lose balance.    1) lift up on toes, start with 5x per day and work up to  20x   2) stand and lift on leg straight out to the side so that foot is a few inches of the floor, start with 5x each side and work up to 20x each side   3) stand on one foot, start with 5 seconds each side and work up to 20 seconds on each side  If you need ideas or help with getting more active:  -Silver sneakers https://tools.silversneakers.com  -Walk with a Doc: http://www.duncan-[email or web address]  -try to include resistance (weight lifting/strength building) and balance exercises twice per week: or the following link for ideas: http://castillo-powell.com/  BuyDucts.dk  STRESS MANAGEMENT: -can try meditating, or just sitting quietly with deep breathing while intentionally relaxing all parts of your body for 5 minutes daily -if you need further help with stress, anxiety or depression please follow up with your primary doctor or contact the wonderful folks at [facility]: [phone]  SOCIAL CONNECTIONS: -options in [place] if you wish to engage in more social and exercise related activities:  -Silver sneakers https://tools.[email or web address]  -Walk with a Doc: http://www.duncan-[email or web address]  -Check out the [facility] Active Adults 50+ section on the [place] [employer] (hiking clubs, book clubs, cards and games, chess, exercise classes, aquatic classes and much more) - see the website for details: https://www.[place].gov/departments/parks-recreation/active-adults50  -YouTube has lots of exercise videos for different ages and abilities as well  -[facility] (a variety of indoor and outdoor inperson activities for adults). [phone]. [address]  -Virtual Online Classes (a variety of topics): see seniorplanet.org or call [phone]  -consider volunteering at a school, hospice center, church, senior center or elsewhere         ADVANCED  HEALTHCARE DIRECTIVES:  Everyone should have advanced health care directives in place. This is so that you get the care you want, should you ever be in a situation where you are unable to make your own medical decisions.   From the [place] Advanced Directive Website: "Advance Health Care Directives are legal documents in which you give written instructions about your health care if, in the future, you cannot speak for yourself.   A health care power of attorney allows you to name a person you trust to make your health care decisions if you cannot make them yourself. A declaration of a desire for a natural death (or living will) is document, which states that you desire not to have your life prolonged by extraordinary measures if you have a terminal or incurable illness or if you are in a vegetative state. An advance instruction for mental health treatment makes a declaration of instructions, information and preferences regarding your mental health treatment. It also states that you are aware that the advance instruction authorizes a mental health treatment provider to act according to your wishes. It may also outline your consent or refusal of mental health treatment. A declaration of an anatomical gift allows anyone over the age of 18 to make a gift by will, organ donor card or other document."   Please see the following website or an elder law attorney for forms, FAQs and for completion of advanced directives: [place] Health Care Directives Advance Health Care Directives (http://guzman.com/)  Or copy and paste the following to your web browser: PoshChat.fi

## 2022-10-13 NOTE — Progress Notes (Signed)
PATIENT CHECK-IN and HEALTH RISK ASSESSMENT QUESTIONNAIRE:  -completed by phone/video for upcoming Medicare Preventive Visit  Pre-Visit Check-in: 1)Vitals (height, wt, BP, etc) - record in vitals section for visit on day of visit 2)Review and Update Medications, Allergies PMH, Surgeries, Social history in Epic 3)Hospitalizations in the last year with date/reason? no  4)Review and Update Care Team (patient's specialists) in Epic 5) Complete PHQ9 in Epic  6) Complete Fall Screening in Epic 7)Review all Health Maintenance Due and order under PCP if not done.  8)Medicare Wellness Questionnaire: Answer theses question about your habits: Do you drink alcohol? no If yes, how many drinks do you have a day? Have you ever smoked?yes Quit date if applicable? 1994  How many packs a day do/did you smoke? 7-8 cigarettes per day Do you use smokeless tobacco?no Do you use an illicit drugs?no Do you exercises? Yes IF so, what type and how many days/minutes per week?walking 4 days per week - 30-40 minutes. Are you sexually active? No Number of partners? Typical breakfast-coffee, toast, bowl of grits Typical lunch-grilled chicken breast or salad Typical dinner-fried pork chop, hamburger, spaghetti, veggies Typical snacks:popcorn, cookie occasionally  Beverages: water, soda  Answer theses question about you: Can you perform most household chores?yes Do you find it hard to follow a conversation in a noisy room?yes Do you often ask people to speak up or repeat themselves?yes Do you feel that you have a problem with memory?sometimes Do you balance your checkbook and or bank acounts?yes Do you feel safe at home?yes Last dentist visit?1 year ago Do you need assistance with any of the following: Please note if so   Driving? no  Feeding yourself? no  Getting from bed to chair? no  Getting to the toilet? no  Bathing or showering? no  Dressing yourself? no  Managing money? no  Climbing a flight of  stairs-no  Preparing meals? no  Do you have Advanced Directives in place (Living Will, Healthcare Power or [place])? No - plans to do this, has the information to do.    Last eye Exam and location? Dr Eustace Pen years ago   Do you currently use prescribed or non-prescribed narcotic or opioid pain medications?no  Do you have a history or close family history of breast, ovarian, tubal or peritoneal cancer or a family member with BRCA (breast cancer susceptibility 1 and 2) gene mutations? Personal history of breast cancer-also daughter and mother both had breast cancer  Nurse/Assistant Credentials/time stamp: Mellody Drown   ----------------------------------------------------------------------------------------------------------------------------------------------------------------------------------------------------------------------   MEDICARE ANNUAL PREVENTIVE VISIT WITH PROVIDER: (Welcome to [facility], initial annual wellness or annual wellness exam)  Virtual Visit via Phone Note  I connected with [name] on 10/13/22 by phone and verified that I am speaking with the correct person using two identifiers.  Location patient: home Location provider:work or home office Persons participating in the virtual visit: patient, provider  Concerns and/or follow up today: no new concerns, chronic knee pain - sees specialist for this. Underwent breast cancer treatment. Reports doing ok. Wants to eat better and has started exercising some.    See HM section in Epic for other details of completed HM.    ROS: negative for report of fevers, unintentional weight loss, vision changes, vision loss, hearing loss or change, chest pain, sob, hemoptysis, melena, hematochezia, hematuria, falls, bleeding or bruising, thoughts of suicide or self harm, memory loss  Patient-completed extensive health risk assessment - reviewed and discussed with the patient: See Health Risk Assessment  completed  with patient prior to the visit either above or in recent phone note. This was reviewed in detailed with the patient today and appropriate recommendations, orders and referrals were placed as needed per Summary below and patient instructions.   Review of Medical History: -PMH, PSH, Family History and current specialty and care providers reviewed and updated and listed below   Patient Care Team: [name], MD as PCP - General (Family Medicine) Richarda Overlie, MD as Attending Physician (Obstetrics and Gynecology) Shelle Iron, Maree Krabbe, MD as Attending Physician (Pulmonary Disease) Manus Rudd, MD as Consulting Physician (General Surgery) Rachel Moulds, MD as Consulting Physician (Hematology and Oncology) Antony Blackbird, MD as Consulting Physician (Radiation Oncology)   Past Medical History:  Diagnosis Date   Arthritis    Asthma    Blindness, legal    L EYE   Breast cancer [facility])    GERD (gastroesophageal reflux disease)    Hearing loss    History of kidney stones    Hyperlipidemia    Hypertension    Lapband May 2013 11/18/2011   Migraines    Obesity, Class III, BMI 40-49.9 (morbid obesity) (HCC) 06/03/2011   Pre-diabetes    Sleep apnea    uses c-pap   Vertigo     Past Surgical History:  Procedure Laterality Date   ABDOMINAL HYSTERECTOMY  2006   fibroids   BREAST LUMPECTOMY WITH RADIOACTIVE SEED LOCALIZATION Left 04/21/2022   Procedure: LEFT BREAST LUMPECTOMY WITH RADIOACTIVE SEED LOCALIZATION;  Surgeon: Manus Rudd, MD;  Location: [facility];  Service: General;  Laterality: Left;   BREATH TEK H PYLORI  08/17/2011   Procedure: BREATH TEK H PYLORI;  Surgeon: Valarie Merino, MD;  Location: [facility];  Service: General;  Laterality: N/A;  PATIENT WILL COME AT 0715   COLONOSCOPY  2008   @ [facility]    EYE SURGERY     Patient unsure of surgery date. Left eye   KNEE SURGERY  1992   right   LAPAROSCOPIC GASTRIC BANDING  10/20/2011   Procedure:  LAPAROSCOPIC GASTRIC BANDING;  Surgeon: Valarie Merino, MD;  Location: [facility];  Service: General;  Laterality: N/A;   RE-EXCISION OF BREAST LUMPECTOMY Left 05/05/2022   Procedure: RE-EXCISION OF LEFT BREAST LUMPECTOMY SITE POSTERIOR MARGIN;  Surgeon: Manus Rudd, MD;  Location: [facility];  Service: General;  Laterality: Left;   TOTAL KNEE ARTHROPLASTY Right 08/18/2021   Procedure: TOTAL KNEE ARTHROPLASTY;  Surgeon: Ollen Gross, MD;  Location: [facility];  Service: Orthopedics;  Laterality: Right;    Social History   Socioeconomic History   Marital status: Married    Spouse name: Not on file   Number of children: Not on file   Years of education: Not on file   Highest education level: Not on file  Occupational History   Not on file  Tobacco Use   Smoking status: Former    Packs/day: 0.50    Years: 15.00    Additional pack years: 0.00    Total pack years: 7.50    Types: Cigarettes    Quit date: 06/02/1994    Years since quitting: 28.3   Smokeless tobacco: Never  Vaping Use   Vaping Use: Never used  Substance and Sexual Activity   Alcohol use: No   Drug use: No   Sexual activity: Not on file    Comment: hyst  Other Topics Concern   Not on file  Social History Narrative   Work or School: works part  time with [employer] - 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       Home Situation: lives with husband      Spiritual Beliefs: Christian  - Gaffer, Higher education careers adviser      Lifestyle: wt watchers 2019, starting to exercise         Social Determinants of Health   Financial Resource Strain: Low Risk  (04/01/2022)   Overall Financial Resource Strain (CARDIA)    Difficulty of Paying Living Expenses: Not very hard  Food Insecurity: No Food Insecurity (04/01/2022)   Hunger Vital Sign    Worried About Running Out of Food in the Last Year: Never true    Ran Out of Food in the Last Year: Never true  Transportation Needs: No Transportation Needs  (04/01/2022)   PRAPARE - Administrator, Civil Service (Medical): No    Lack of Transportation (Non-Medical): No  Physical Activity: Inactive (10/06/2021)   Exercise Vital Sign    Days of Exercise per Week: 0 days    Minutes of Exercise per Session: 0 min  Stress: No Stress Concern Present (10/06/2021)   [employer] of Occupational Health - Occupational Stress Questionnaire    Feeling of Stress : Not at all  Social Connections: Not on file  Intimate Partner Violence: Not on file    Family History  Problem Relation Age of Onset   Breast cancer Mother 30   Alcohol abuse Father    Heart disease Father 55       MI   Hypertension Father    Colon cancer Brother        dx after 61   Breast cancer Daughter 34    Current Outpatient Medications on File Prior to Visit  Medication Sig Dispense Refill   albuterol (PROVENTIL) (2.5 MG/3ML) 0.083% nebulizer solution Take 3 mLs (2.5 mg total) by nebulization every 4 (four) hours as needed for wheezing or shortness of breath. 75 mL 1   albuterol (VENTOLIN HFA) 108 (90 Base) MCG/ACT inhaler Use 2 puffs every 4 hours as needed for cough or wheeze.  May use  2 puffs 10-20 minutes prior to exercise. 18 g 1   ALVESCO 160 MCG/ACT inhaler Inhale 1 puff into the lungs 2 (two) times daily. 1 each 2   hydrochlorothiazide (HYDRODIURIL) 12.5 MG tablet TAKE 1 TABLET BY MOUTH EVERY DAY 90 tablet 1   ibuprofen (ADVIL) 800 MG tablet Take 800 mg by mouth 2 (two) times daily as needed.     lisinopril (ZESTRIL) 30 MG tablet TAKE 1 TABLET BY MOUTH EVERY DAY 90 tablet 1   montelukast (SINGULAIR) 10 MG tablet TAKE 1 TABLET BY MOUTH EVERYDAY AT BEDTIME 30 tablet 0   Multiple Vitamins-Minerals (MULTIVITAMIN WITH MINERALS) tablet Take 1 tablet by mouth daily with breakfast.     vitamin B-12 (CYANOCOBALAMIN) 100 MCG tablet Take 100 mcg by mouth daily.     No current facility-administered medications on file prior to visit.    Allergies  Allergen  Reactions   Hydrocodone Hives, Shortness Of Breath and Itching   Sulfa Antibiotics Hives       Physical Exam There were no vitals filed for this visit. Estimated body mass index is 34.6 kg/m as calculated from the following:   Height as of 10/06/22: 5\' 3"  (1.6 m).   Weight as of 10/06/22: 195 lb 4.8 oz (88.6 kg).  EKG (optional): deferred due to virtual visit  GENERAL: alert, oriented, no acute distress detected, full  vision exam deferred due to pandemic and/or virtual encounter  HEENT: atraumatic, conjunttiva clear, no obvious abnormalities on inspection of external nose and ears  NECK: normal movements of the head and neck  LUNGS: on inspection no signs of respiratory distress, breathing rate appears normal, no obvious gross SOB, gasping or wheezing  CV: no obvious cyanosis  MS: moves all visible extremities without noticeable abnormality  PSYCH/NEURO: pleasant and cooperative, no obvious depression or anxiety, speech and thought processing grossly intact, Cognitive function grossly intact  Flowsheet Row Office Visit from 10/13/2022 in [facility] [place]  PHQ-9 Total Score 6           10/13/2022    3:48 PM 08/12/2022    8:28 AM 10/06/2021    9:20 AM 07/10/2021    9:04 AM 12/27/2019   12:00 PM  Depression screen PHQ 2/9  Decreased Interest 1 1 0 1 1  Down, Depressed, Hopeless 1 0 0 0 1  PHQ - 2 Score 2 1 0 1 2  Altered sleeping 1 1   1   Tired, decreased energy 1 3   1   Change in appetite 0 0   0  Feeling bad or failure about yourself  0 0   0  Trouble concentrating 0 1   0  Moving slowly or fidgety/restless 1 0   0  Suicidal thoughts 1 0   0  PHQ-9 Score 6 6   4   Difficult doing work/chores  Somewhat difficult   Somewhat difficult  Reports doing ok - was seeing a counselor last year. Might go ahead and reschedule.      02/04/2022    2:26 PM 04/21/2022   11:02 AM 05/05/2022    9:01 AM 05/20/2022    9:28 AM 10/13/2022    3:47 PM  Fall Risk   Falls in the past year?     0  Was there an injury with Fall?     0  Fall Risk Category Calculator     0  (RETIRED) Patient Fall Risk Level Low fall risk Moderate fall risk Moderate fall risk Low fall risk   Patient at Risk for Falls Due to     No Fall Risks  Fall risk Follow up     Falls evaluation completed     SUMMARY AND PLAN:  Encounter for Medicare annual wellness exam    Discussed applicable health maintenance/preventive health measures and advised and referred or ordered per patient preferences: -discussed vaccine recommendations and where to obtain each  Health Maintenance  Topic Date Due   DTaP/Tdap/Td (1 - Tdap) Never done   Zoster Vaccines- Shingrix (1 of 2) Never done   Pneumonia Vaccine 50+ Years old (2 of 2 - PCV) 08/16/2020   COVID-19 Vaccine (5 - 2023-24 season) 10/13/2023 (Originally 03/04/2022)   INFLUENZA VACCINE  01/14/2023   Medicare Annual Wellness (AWV)  10/13/2023   MAMMOGRAM  04/01/2024   COLONOSCOPY (Pts 45-58yrs Insurance coverage will need to be confirmed)  08/07/2027   DEXA SCAN  Completed   Hepatitis C Screening  Completed   HPV VACCINES  Aged [employer] and counseling on the following was provided based on the above review of health and a plan/checklist for the patient, along with additional information discussed, was provided for the patient in the patient instructions :  -Advised on importance of completing advanced directives, discussed options for completing and provided information in patient instructions as well  -Provided safe balance  exercises that can be done at home to improve balance and discussed exercise guidelines for adults with include balance exercises at least 3 days per week.  -Advised and counseled on a healthy lifestyle - including the importance of a healthy diet, regular physical activity, social connections and stress management. -Reviewed patient's current diet. Advised and counseled on a whole foods based  healthy diet. A summary of a healthy diet was provided in the Patient Instructions.  -reviewed patient's current physical activity level and discussed exercise guidelines for adults. Discussed community resources and ideas for safe exercise at home to assist in meeting exercise guideline recommendations in a safe and healthy way.  -Advise yearly dental visits at minimum and regular eye exams -Counseled on depression positive screening and discussed options, she plans to try to get back into doing some CBT with her therapist she used in the past - report has the number to call.   Follow up: see patient instructions     Patient Instructions  I really enjoyed getting to talk with you today! I am available on Tuesdays and Thursdays for virtual visits if you have any questions or concerns, or if I can be of any further assistance.   CHECKLIST FROM ANNUAL WELLNESS VISIT:  -Follow up (please call to schedule if not scheduled after visit):   -yearly for annual wellness visit with primary care office  Here is a list of your preventive care/health maintenance measures and the plan for each if any are due:  PLAN For any measures below that may be due:  -can get the vaccines at the pharmacy, please send [place] a copy when you do.  Health Maintenance  Topic Date Due   DTaP/Tdap/Td (1 - Tdap) Never done   Zoster Vaccines- Shingrix (1 of 2) Never done   Pneumonia Vaccine 40+ Years old (2 of 2 - PCV) 08/16/2020   COVID-19 Vaccine (5 - 2023-24 season) 10/13/2023 (Originally 03/04/2022)   INFLUENZA VACCINE  01/14/2023   Medicare Annual Wellness (AWV)  10/13/2023   MAMMOGRAM  04/01/2024   COLONOSCOPY (Pts 45-80yrs Insurance coverage will need to be confirmed)  08/07/2027   DEXA SCAN  Completed   Hepatitis C Screening  Completed   HPV VACCINES  Aged Out    -See a dentist at least yearly  -Get your eyes checked and then per your eye specialist's recommendations  -Other issues addressed today:   -I  have included below further information regarding a healthy whole foods based diet, physical activity guidelines for adults, stress management and opportunities for social connections. I hope you find this information useful.   -----------------------------------------------------------------------------------------------------------------------------------------------------------------------------------------------------------------------------------------------------------  NUTRITION: -eat real food: lots of colorful vegetables (half the plate) and fruits -5-7 servings of vegetables and fruits per day (fresh or steamed is best), exp. 2 servings of vegetables with lunch and dinner and 2 servings of fruit per day. Berries and greens such as kale and collards are great choices.  -consume on a regular basis: whole grains (make sure first ingredient on label contains the word "whole"), fresh fruits, fish, nuts, seeds, healthy oils (such as olive oil, avocado oil, grape seed oil) -may eat small amounts of dairy and lean meat on occasion, but avoid processed meats such as ham, bacon, lunch meat, etc. -drink water -try to avoid fast food and pre-packaged foods, processed meat -most experts advise limiting sodium to < 2300mg  per day, should limit further is any chronic conditions such as high blood pressure, heart disease, diabetes, etc. The American Heart  Association advised that < 1500mg  is is ideal -try to avoid foods that contain any ingredients with names you do not recognize  -try to avoid sugar/sweets (except for the natural sugar that occurs in fresh fruit) -try to avoid sweet drinks -try to avoid white rice, white bread, pasta (unless whole grain), white or yellow potatoes  EXERCISE GUIDELINES FOR ADULTS: -if you wish to increase your physical activity, do so gradually and with the approval of your doctor -STOP and seek medical care immediately if you have any chest pain, chest discomfort or  trouble breathing when starting or increasing exercise  -move and stretch your body, legs, feet and arms when sitting for long periods -Physical activity guidelines for optimal health in adults: -least 150 minutes per week of aerobic exercise (can talk, but not sing) once approved by your doctor, 20-30 minutes of sustained activity or two 10 minute episodes of sustained activity every day.  -resistance training at least 2 days per week if approved by your doctor -balance exercises 3+ days per week:   Stand somewhere where you have something sturdy to hold onto if you lose balance.    1) lift up on toes, start with 5x per day and work up to 20x   2) stand and lift on leg straight out to the side so that foot is a few inches of the floor, start with 5x each side and work up to 20x each side   3) stand on one foot, start with 5 seconds each side and work up to 20 seconds on each side  If you need ideas or help with getting more active:  -Silver sneakers https://tools.silversneakers.com  -Walk with a Doc: http://www.duncan-williams.com/  -try to include resistance (weight lifting/strength building) and balance exercises twice per week: or the following link for ideas: http://castillo-powell.com/  BuyDucts.dk  STRESS MANAGEMENT: -can try meditating, or just sitting quietly with deep breathing while intentionally relaxing all parts of your body for 5 minutes daily -if you need further help with stress, anxiety or depression please follow up with your primary doctor or contact the wonderful folks at [facility]: [phone]  SOCIAL CONNECTIONS: -options in [place] if you wish to engage in more social and exercise related activities:  -Silver sneakers https://tools.silversneakers.com  -Walk with a Doc: http://www.duncan-williams.com/  -Check out the [facility] Active Adults 50+ section on the  [place] [employer] (hiking clubs, book clubs, cards and games, chess, exercise classes, aquatic classes and much more) - see the website for details: https://www.[place].gov/departments/parks-recreation/active-adults50  -YouTube has lots of exercise videos for different ages and abilities as well  -[facility] (a variety of indoor and outdoor inperson activities for adults). [phone]. [address].  -Virtual Online Classes (a variety of topics): see seniorplanet.org or call 303-434-2064  -consider volunteering at a school, hospice center, church, senior center or elsewhere         ADVANCED HEALTHCARE DIRECTIVES:  Everyone should have advanced health care directives in place. This is so that you get the care you want, should you ever be in a situation where you are unable to make your own medical decisions.   From the [place] Advanced Directive Website: "Advance Health Care Directives are legal documents in which you give written instructions about your health care if, in the future, you cannot speak for yourself.   A health care power of attorney allows you to name a person you trust to make your health care decisions if you cannot make them  yourself. A declaration of a desire for a natural death (or living will) is document, which states that you desire not to have your life prolonged by extraordinary measures if you have a terminal or incurable illness or if you are in a vegetative state. An advance instruction for mental health treatment makes a declaration of instructions, information and preferences regarding your mental health treatment. It also states that you are aware that the advance instruction authorizes a mental health treatment provider to act according to your wishes. It may also outline your consent or refusal of mental health treatment. A declaration of an anatomical gift allows anyone over the age of 37 to make a gift by will, organ  donor card or other document."   Please see the following website or an elder law attorney for forms, FAQs and for completion of advanced directives: [place] TEFL teacher Health Care Directives Advance Health Care Directives (http://guzman.com/)  Or copy and paste the following to your web browser: PoshChat.fi    Terressa Koyanagi, DO

## 2022-10-25 ENCOUNTER — Other Ambulatory Visit: Payer: Self-pay

## 2022-10-25 ENCOUNTER — Emergency Department (HOSPITAL_COMMUNITY): Payer: 59

## 2022-10-25 ENCOUNTER — Emergency Department (HOSPITAL_COMMUNITY)
Admission: EM | Admit: 2022-10-25 | Discharge: 2022-10-25 | Disposition: A | Discharge status: Home / Self Care | Payer: 59 | Attending: Emergency Medicine | Admitting: Emergency Medicine

## 2022-10-25 ENCOUNTER — Encounter (HOSPITAL_COMMUNITY): Payer: Self-pay | Admitting: Pharmacy Technician

## 2022-10-25 DIAGNOSIS — Z96651 Presence of right artificial knee joint: Secondary | ICD-10-CM | POA: Diagnosis not present

## 2022-10-25 DIAGNOSIS — M542 Cervicalgia: Secondary | ICD-10-CM | POA: Diagnosis not present

## 2022-10-25 DIAGNOSIS — M25561 Pain in right knee: Secondary | ICD-10-CM | POA: Diagnosis present

## 2022-10-25 DIAGNOSIS — Y92410 Unspecified street and highway as the place of occurrence of the external cause: Secondary | ICD-10-CM | POA: Diagnosis not present
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
## 2022-10-25 NOTE — ED Notes (Signed)
Discharge instructions discussed with pt. Verbalized understanding. VSS. No questions or concerns regarding discharge  
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
## 2022-10-28 ENCOUNTER — Ambulatory Visit (INDEPENDENT_AMBULATORY_CARE_PROVIDER_SITE_OTHER): Payer: 59 | Admitting: Family Medicine

## 2022-10-28 ENCOUNTER — Encounter: Payer: Self-pay | Admitting: Family Medicine

## 2022-10-28 VITALS — BP 138/88 | HR 78 | Temp 98.8°F | Wt 194.4 lb

## 2022-10-28 DIAGNOSIS — S29011A Strain of muscle and tendon of front wall of thorax, initial encounter: Secondary | ICD-10-CM | POA: Diagnosis not present

## 2022-10-28 DIAGNOSIS — M62830 Muscle spasm of back: Secondary | ICD-10-CM | POA: Diagnosis not present

## 2022-10-28 DIAGNOSIS — M545 Low back pain, unspecified: Secondary | ICD-10-CM | POA: Diagnosis not present

## 2022-10-28 DIAGNOSIS — G8929 Other chronic pain: Secondary | ICD-10-CM

## 2022-10-28 DIAGNOSIS — Z96651 Presence of right artificial knee joint: Secondary | ICD-10-CM

## 2022-10-28 DIAGNOSIS — I10 Essential (primary) hypertension: Secondary | ICD-10-CM

## 2022-10-28 DIAGNOSIS — M25561 Pain in right knee: Secondary | ICD-10-CM

## 2022-10-28 MED ORDER — CYCLOBENZAPRINE HCL 5 MG PO TABS
5.00 mg | ORAL_TABLET | Freq: Three times a day (TID) | ORAL | 0 refills | Status: DC | PRN
Start: 2022-10-28 — End: 2022-12-21
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
## 2022-11-03 ENCOUNTER — Other Ambulatory Visit: Payer: Self-pay

## 2022-11-03 ENCOUNTER — Inpatient Hospital Stay: Payer: 59 | Attending: Hematology and Oncology | Admitting: Adult Health

## 2022-11-03 NOTE — Progress Notes (Deleted)
[facility] Cancer Follow up:    [name], MD [address] [place] [address]   DIAGNOSIS: Cancer Staging  Ductal carcinoma in situ (DCIS) of left breast Staging form: Breast, AJCC 8th Edition - Clinical stage from 04/01/2022: Stage 0 (cTis (DCIS), cN0, cM0, ER+, PR+) - Unsigned Stage prefix: Initial diagnosis Nuclear grade: G2   SUMMARY OF ONCOLOGIC HISTORY: Oncology History  Ductal carcinoma in situ (DCIS) of left breast  03/17/2022 Mammogram   New solid left breast mass identified at 11:30, 2 cm from the nipple. Targeted ultrasound is performed, showing a mass at 11:30, 2 cm from the nipple measuring 7 x 3 x 8 mm, solid in appearance. No axillary adenopathy.    Pathology Results   Pathology from left breast showed DCIS, intermediate to high nuclear grade, solid and cribriform types with focal necrosis.  Negative for invasive carcinoma.  Negative for microcalcifications.  Prognostics showed ER 95% positive strong staining PR 40% positive strong staining.   04/21/2022 Surgery   Left lumpectomy, DCIS 1.6 cm, intermediate to high grade, DCIS abuts posterior, superior, and lateral margin.   05/05/2022 Surgery   RE excision to clear margin   05/27/2022 - 06/26/2022 Radiation Therapy   Plan Name: Breast_L Site: Breast, Left Technique: 3D Mode: Photon Dose Per Fraction: 2.67 Gy Prescribed Dose (Delivered / Prescribed): 40.05 Gy / 40.05 Gy Prescribed Fxs (Delivered / Prescribed): 15 / 15   Plan Name: Breast_L_Bst Site: Breast, Left Technique: 3D Mode: Photon Dose Per Fraction: 2 Gy Prescribed Dose (Delivered / Prescribed): 12 Gy / 12 Gy Prescribed Fxs (Delivered / Prescribed): 6 / 6   07/2022 -  Anti-estrogen oral therapy   Anastrozole     CURRENT THERAPY:  Anastrozole  INTERVAL HISTORY: Rebecca [name] 67 y.o. female returns for f/u of her non invasive breast cancer.  At her last visit she was experiencing worsening anxiety and  depression.  At that time I recommended she discontinue anastrozole.     Patient Active Problem List   Diagnosis Date Noted   Genetic testing 04/01/2022   Ductal carcinoma in situ (DCIS) of left breast 03/27/2022   OA (osteoarthritis) of knee 08/18/2021   S/P total knee arthroplasty, right 08/18/2021   Prediabetes 12/29/2019   Vitamin D deficiency 12/29/2019   Class 2 obesity due to excess calories with serious comorbidity and body mass index (BMI) of 38.0 to 38.9 in adult 08/31/2016   Hyperlipidemia 05/04/2016   Essential hypertension, benign 12/26/2012   Asthma 12/26/2012   OSA (obstructive sleep apnea) 10/05/2011    is allergic to hydrocodone and sulfa antibiotics.  MEDICAL HISTORY: Past Medical History:  Diagnosis Date   Arthritis    Asthma    Blindness, legal    L EYE   Breast cancer (HCC)    GERD (gastroesophageal reflux disease)    Hearing loss    History of kidney stones    Hyperlipidemia    Hypertension    Lapband May 2013 11/18/2011   Migraines    Obesity, Class III, BMI 40-49.9 (morbid obesity) (HCC) 06/03/2011   Pre-diabetes    Sleep apnea    uses c-pap   Vertigo     SURGICAL HISTORY: Past Surgical History:  Procedure Laterality Date   ABDOMINAL HYSTERECTOMY  2006   fibroids   BREAST LUMPECTOMY WITH RADIOACTIVE SEED LOCALIZATION Left 04/21/2022   Procedure: LEFT BREAST LUMPECTOMY WITH RADIOACTIVE SEED LOCALIZATION;  Surgeon: Manus Rudd, MD;  Location: [facility];  Service: General;  Laterality: Left;   BREATH TEK H PYLORI  08/17/2011   Procedure: BREATH TEK H PYLORI;  Surgeon: Valarie Merino, MD;  Location: [facility];  Service: General;  Laterality: N/A;  PATIENT WILL COME AT 0715   COLONOSCOPY  2008   @ [facility]    EYE SURGERY     Patient unsure of surgery date. Left eye   KNEE SURGERY  1992   right   LAPAROSCOPIC GASTRIC BANDING  10/20/2011   Procedure: LAPAROSCOPIC GASTRIC BANDING;  Surgeon: Valarie Merino, MD;  Location: [facility];  Service: General;  Laterality: N/A;   RE-EXCISION OF BREAST LUMPECTOMY Left 05/05/2022   Procedure: RE-EXCISION OF LEFT BREAST LUMPECTOMY SITE POSTERIOR MARGIN;  Surgeon: Manus Rudd, MD;  Location: [facility];  Service: General;  Laterality: Left;   TOTAL KNEE ARTHROPLASTY Right 08/18/2021   Procedure: TOTAL KNEE ARTHROPLASTY;  Surgeon: Ollen Gross, MD;  Location: [facility];  Service: Orthopedics;  Laterality: Right;    SOCIAL HISTORY: Social History   Socioeconomic History   Marital status: Married    Spouse name: Not on file   Number of children: Not on file   Years of education: Not on file   Highest education level: Not on file  Occupational History   Not on file  Tobacco Use   Smoking status: Former    Packs/day: 0.50    Years: 15.00    Additional pack years: 0.00    Total pack years: 7.50    Types: Cigarettes    Quit date: 06/02/1994    Years since quitting: 28.4   Smokeless tobacco: Never  Vaping Use   Vaping Use: Never used  Substance and Sexual Activity   Alcohol use: No   Drug use: No   Sexual activity: Not on file    Comment: hyst  Other Topics Concern   Not on file  Social History Narrative   Work or School: works part time with [employer] - 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       Home Situation: lives with husband      Spiritual Beliefs: Christian  - Gaffer, Higher education careers adviser      Lifestyle: wt watchers 2019, starting to exercise         Social Determinants of Health   Financial Resource Strain: Low Risk  (04/01/2022)   Overall Financial Resource Strain (CARDIA)    Difficulty of Paying Living Expenses: Not very hard  Food Insecurity: No Food Insecurity (04/01/2022)   Hunger Vital Sign    Worried About Running Out of Food in the Last Year: Never true    Ran Out of Food in the Last Year: Never true  Transportation Needs: No Transportation Needs (04/01/2022)   PRAPARE - Scientist, research (physical sciences) (Medical): No    Lack of Transportation (Non-Medical): No  Physical Activity: Inactive (10/06/2021)   Exercise Vital Sign    Days of Exercise per Week: 0 days    Minutes of Exercise per Session: 0 min  Stress: No Stress Concern Present (10/06/2021)   [employer] of Occupational Health - Occupational Stress Questionnaire    Feeling of Stress : Not at all  Social Connections: Not on file  Intimate Partner Violence: Not on file    FAMILY HISTORY: Family History  Problem Relation Age of Onset   Breast cancer Mother 30   Alcohol abuse Father    Heart disease Father 69  MI   Hypertension Father    Colon cancer Brother        dx after 12   Breast cancer Daughter 31    Review of Systems  Constitutional:  Negative for appetite change, chills, fatigue, fever and unexpected weight change.  HENT:   Negative for hearing loss, lump/mass and trouble swallowing.   Eyes:  Negative for eye problems and icterus.  Respiratory:  Negative for chest tightness, cough and shortness of breath.   Cardiovascular:  Negative for chest pain, leg swelling and palpitations.  Gastrointestinal:  Negative for abdominal distention, abdominal pain, constipation, diarrhea, nausea and vomiting.  Endocrine: Negative for hot flashes.  Genitourinary:  Negative for difficulty urinating.   Musculoskeletal:  Negative for arthralgias.  Skin:  Negative for itching and rash.  Neurological:  Negative for dizziness, extremity weakness, headaches and numbness.  Hematological:  Negative for adenopathy. Does not bruise/bleed easily.  Psychiatric/Behavioral:  Negative for depression. The patient is not nervous/anxious.       PHYSICAL EXAMINATION    There were no vitals filed for this visit.  Physical Exam Constitutional:      General: She is not in acute distress.    Appearance: Normal appearance. She is not toxic-appearing.  HENT:     Head: Normocephalic and atraumatic.  Eyes:     General:  No scleral icterus. Cardiovascular:     Rate and Rhythm: Normal rate and regular rhythm.     Pulses: Normal pulses.     Heart sounds: Normal heart sounds.  Pulmonary:     Effort: Pulmonary effort is normal.     Breath sounds: Normal breath sounds.  Abdominal:     General: Abdomen is flat. Bowel sounds are normal. There is no distension.     Palpations: Abdomen is soft.     Tenderness: There is no abdominal tenderness.  Musculoskeletal:        General: No swelling.     Cervical back: Neck supple.  Lymphadenopathy:     Cervical: No cervical adenopathy.  Skin:    General: Skin is warm and dry.     Findings: No rash.  Neurological:     General: No focal deficit present.     Mental Status: She is alert.  Psychiatric:        Mood and Affect: Mood normal.        Behavior: Behavior normal.     LABORATORY DATA:  CBC    Component Value Date/Time   WBC 6.8 04/01/2022 0826   WBC 6.6 02/04/2022 1517   RBC 4.14 04/01/2022 0826   HGB 12.9 04/01/2022 0826   HCT 37.6 04/01/2022 0826   PLT 400 04/01/2022 0826   MCV 90.8 04/01/2022 0826   MCH 31.2 04/01/2022 0826   MCHC 34.3 04/01/2022 0826   RDW 12.7 04/01/2022 0826   LYMPHSABS 1.7 04/01/2022 0826   MONOABS 0.5 04/01/2022 0826   EOSABS 0.4 04/01/2022 0826   BASOSABS 0.1 04/01/2022 0826    CMP     Component Value Date/Time   NA 143 04/01/2022 0826   K 3.4 (L) 04/01/2022 0826   CL 106 04/01/2022 0826   CO2 31 04/01/2022 0826   GLUCOSE 127 (H) 04/01/2022 0826   BUN 14 04/01/2022 0826   CREATININE 0.92 04/01/2022 0826   CREATININE 0.79 12/27/2019 1210   CALCIUM 9.8 04/01/2022 0826   PROT 7.2 04/01/2022 0826   ALBUMIN 4.4 04/01/2022 0826   AST 20 04/01/2022 0826   ALT 19 04/01/2022 0826  ALKPHOS 64 04/01/2022 0826   BILITOT 0.3 04/01/2022 0826   GFRNONAA >60 04/01/2022 0826   GFRAA >90 10/20/2011 1843       PENDING LABS:   RADIOGRAPHIC STUDIES:  No results found.   PATHOLOGY:     ASSESSMENT and  THERAPY PLAN:   No problem-specific Assessment & Plan notes found for this encounter.   No orders of the defined types were placed in this encounter.   All questions were answered. The patient knows to call the clinic with any problems, questions or concerns. We can certainly see the patient much sooner if necessary. This note was electronically signed. Noreene Filbert, NP 11/03/2022

## 2022-11-16 ENCOUNTER — Other Ambulatory Visit: Payer: Self-pay

## 2022-11-16 ENCOUNTER — Inpatient Hospital Stay: Payer: 59 | Attending: Hematology and Oncology | Admitting: Hematology and Oncology

## 2022-11-16 VITALS — BP 149/72 | HR 88 | Temp 97.2°F | Resp 18 | Ht 63.0 in | Wt 196.1 lb

## 2022-11-16 DIAGNOSIS — D0512 Intraductal carcinoma in situ of left breast: Secondary | ICD-10-CM | POA: Diagnosis present

## 2022-11-16 DIAGNOSIS — R61 Generalized hyperhidrosis: Secondary | ICD-10-CM | POA: Diagnosis not present

## 2022-11-16 DIAGNOSIS — Z79811 Long term (current) use of aromatase inhibitors: Secondary | ICD-10-CM | POA: Insufficient documentation

## 2022-11-16 DIAGNOSIS — Z800 Family history of malignant neoplasm of digestive organs: Secondary | ICD-10-CM | POA: Diagnosis not present

## 2022-11-16 DIAGNOSIS — N951 Menopausal and female climacteric states: Secondary | ICD-10-CM | POA: Diagnosis not present

## 2022-11-16 DIAGNOSIS — Z90710 Acquired absence of both cervix and uterus: Secondary | ICD-10-CM | POA: Insufficient documentation

## 2022-11-16 DIAGNOSIS — Z803 Family history of malignant neoplasm of breast: Secondary | ICD-10-CM | POA: Diagnosis not present

## 2022-11-16 DIAGNOSIS — Z87891 Personal history of nicotine dependence: Secondary | ICD-10-CM | POA: Diagnosis not present

## 2022-11-16 DIAGNOSIS — Z170 Estrogen receptor positive status [ER+]: Secondary | ICD-10-CM | POA: Diagnosis not present

## 2022-11-16 MED ORDER — TAMOXIFEN CITRATE 20 MG PO TABS: 20.0000 mg | ORAL_TABLET | Freq: Every day | ORAL | 3 refills | Status: DC

## 2022-11-16 NOTE — Progress Notes (Signed)
BRIEF ONCOLOGIC HISTORY:  Oncology History  Ductal carcinoma in situ (DCIS) of left breast  03/17/2022 Mammogram   New solid left breast mass identified at 11:30, 2 cm from the nipple. Targeted ultrasound is performed, showing a mass at 11:30, 2 cm from the nipple measuring 7 x 3 x 8 mm, solid in appearance. No axillary adenopathy.    Pathology Results   Pathology from left breast showed DCIS, intermediate to high nuclear grade, solid and cribriform types with focal necrosis.  Negative for invasive carcinoma.  Negative for microcalcifications.  Prognostics showed ER 95% positive strong staining PR 40% positive strong staining.   04/21/2022 Surgery   Left lumpectomy, DCIS 1.6 cm, intermediate to high grade, DCIS abuts posterior, superior, and lateral margin.   04/21/2022 Cancer Staging   Staging form: Breast, AJCC 8th Edition - Pathologic stage from 04/21/2022: Stage 0 (pTis (DCIS), pN0, cM0, ER+, PR+) - Signed by Loa Socks, NP on 11/03/2022 Stage prefix: Initial diagnosis Nuclear grade: G3   05/05/2022 Surgery   RE excision to clear margin   05/27/2022 - 06/26/2022 Radiation Therapy   Plan Name: Breast_L Site: Breast, Left Technique: 3D Mode: Photon Dose Per Fraction: 2.67 Gy Prescribed Dose (Delivered / Prescribed): 40.05 Gy / 40.05 Gy Prescribed Fxs (Delivered / Prescribed): 15 / 15   Plan Name: Breast_L_Bst Site: Breast, Left Technique: 3D Mode: Photon Dose Per Fraction: 2 Gy Prescribed Dose (Delivered / Prescribed): 12 Gy / 12 Gy Prescribed Fxs (Delivered / Prescribed): 6 / 6   07/2022 -  Anti-estrogen oral therapy   Anastrozole     INTERVAL HISTORY:   Patient is here for follow-up.  Since her last visit here, anastrozole was discontinued since she has been having a lot of irritability, hot flashes and excessive night sweats.  She feels better off of the medication.  Apparently her family has noticed that she has been much more irritable than at baseline.   She would like to try something different. Rest of the pertinent 10 point ROS reviewed and negative  PAST MEDICAL/SURGICAL HISTORY:  Past Medical History:  Diagnosis Date   Arthritis    Asthma    Blindness, legal    L EYE   Breast cancer (HCC)    GERD (gastroesophageal reflux disease)    Hearing loss    History of kidney stones    Hyperlipidemia    Hypertension    Lapband May 2013 11/18/2011   Migraines    Obesity, Class III, BMI 40-49.9 (morbid obesity) (HCC) 06/03/2011   Pre-diabetes    Sleep apnea    uses c-pap   Vertigo    Past Surgical History:  Procedure Laterality Date   ABDOMINAL HYSTERECTOMY  2006   fibroids   BREAST LUMPECTOMY WITH RADIOACTIVE SEED LOCALIZATION Left 04/21/2022   Procedure: LEFT BREAST LUMPECTOMY WITH RADIOACTIVE SEED LOCALIZATION;  Surgeon: Manus Rudd, MD;  Location: [facility];  Service: General;  Laterality: Left;   BREATH TEK H PYLORI  08/17/2011   Procedure: BREATH TEK H PYLORI;  Surgeon: Valarie Merino, MD;  Location: [facility];  Service: General;  Laterality: N/A;  PATIENT WILL COME AT 0715   COLONOSCOPY  2008   @ [facility]    EYE SURGERY     Patient unsure of surgery date. Left eye   KNEE SURGERY  1992   right   LAPAROSCOPIC GASTRIC BANDING  10/20/2011   Procedure: LAPAROSCOPIC GASTRIC BANDING;  Surgeon: Valarie Merino, MD;  Location: [facility];  Service:  General;  Laterality: N/A;   RE-EXCISION OF BREAST LUMPECTOMY Left 05/05/2022   Procedure: RE-EXCISION OF LEFT BREAST LUMPECTOMY SITE POSTERIOR MARGIN;  Surgeon: Manus Rudd, MD;  Location: [facility];  Service: General;  Laterality: Left;   TOTAL KNEE ARTHROPLASTY Right 08/18/2021   Procedure: TOTAL KNEE ARTHROPLASTY;  Surgeon: Ollen Gross, MD;  Location: [facility];  Service: Orthopedics;  Laterality: Right;     ALLERGIES:  Allergies  Allergen Reactions   Hydrocodone Hives, Shortness Of Breath and Itching   Sulfa Antibiotics Hives     CURRENT  MEDICATIONS:  Outpatient Encounter Medications as of 11/16/2022  Medication Sig   albuterol (PROVENTIL) (2.5 MG/3ML) 0.083% nebulizer solution Take 3 mLs (2.5 mg total) by nebulization every 4 (four) hours as needed for wheezing or shortness of breath.   albuterol (VENTOLIN HFA) 108 (90 Base) MCG/ACT inhaler Use 2 puffs every 4 hours as needed for cough or wheeze.  May use  2 puffs 10-20 minutes prior to exercise.   ALVESCO 160 MCG/ACT inhaler Inhale 1 puff into the lungs 2 (two) times daily.   cyclobenzaprine (FLEXERIL) 5 MG tablet Take 1 tablet (5 mg total) by mouth 3 (three) times daily as needed for muscle spasms.   hydrochlorothiazide (HYDRODIURIL) 12.5 MG tablet TAKE 1 TABLET BY MOUTH EVERY DAY   ibuprofen (ADVIL) 800 MG tablet Take 800 mg by mouth 2 (two) times daily as needed.   lisinopril (ZESTRIL) 30 MG tablet TAKE 1 TABLET BY MOUTH EVERY DAY   montelukast (SINGULAIR) 10 MG tablet TAKE 1 TABLET BY MOUTH EVERYDAY AT BEDTIME   Multiple Vitamins-Minerals (MULTIVITAMIN WITH MINERALS) tablet Take 1 tablet by mouth daily with breakfast.   vitamin B-12 (CYANOCOBALAMIN) 100 MCG tablet Take 100 mcg by mouth daily.   No facility-administered encounter medications on file as of 11/16/2022.     ONCOLOGIC FAMILY HISTORY:  Family History  Problem Relation Age of Onset   Breast cancer Mother 45   Alcohol abuse Father    Heart disease Father 94       MI   Hypertension Father    Colon cancer Brother        dx after 52   Breast cancer Daughter 23      SOCIAL HISTORY:  Social History   Socioeconomic History   Marital status: Married    Spouse name: Not on file   Number of children: Not on file   Years of education: Not on file   Highest education level: Not on file  Occupational History   Not on file  Tobacco Use   Smoking status: Former    Packs/day: 0.50    Years: 15.00    Additional pack years: 0.00    Total pack years: 7.50    Types: Cigarettes    Quit date: 06/02/1994     Years since quitting: 28.4   Smokeless tobacco: Never  Vaping Use   Vaping Use: Never used  Substance and Sexual Activity   Alcohol use: No   Drug use: No   Sexual activity: Not on file    Comment: hyst  Other Topics Concern   Not on file  Social History Narrative   Work or School: works part time with [employer] - 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       Home Situation: lives with husband      Spiritual Beliefs: Christian  - Gaffer, Higher education careers adviser      Lifestyle: wt watchers 2019, starting to  exercise         Social Determinants of Health   Financial Resource Strain: Low Risk  (04/01/2022)   Overall Financial Resource Strain (CARDIA)    Difficulty of Paying Living Expenses: Not very hard  Food Insecurity: No Food Insecurity (04/01/2022)   Hunger Vital Sign    Worried About Running Out of Food in the Last Year: Never true    Ran Out of Food in the Last Year: Never true  Transportation Needs: No Transportation Needs (04/01/2022)   PRAPARE - Administrator, Civil Service (Medical): No    Lack of Transportation (Non-Medical): No  Physical Activity: Inactive (10/06/2021)   Exercise Vital Sign    Days of Exercise per Week: 0 days    Minutes of Exercise per Session: 0 min  Stress: No Stress Concern Present (10/06/2021)   [employer] of Occupational Health - Occupational Stress Questionnaire    Feeling of Stress : Not at all  Social Connections: Not on file  Intimate Partner Violence: Not on file     OBSERVATIONS/OBJECTIVE:  BP (!) 149/72 (BP Location: Left Arm, Patient Position: Sitting)   Pulse 88   Temp (!) 97.2 F (36.2 C) (Temporal)   Resp 18   Ht 5\' 3"  (1.6 m)   Wt 196 lb 1.6 oz (89 kg)   SpO2 97%   BMI 34.74 kg/m   GENERAL: Patient is a well appearing female in no acute distress No concerning breast changes.  No regional adenopathy No lower extremity edema  LABORATORY DATA:  None for this visit.  DIAGNOSTIC  IMAGING:  None for this visit.      ASSESSMENT AND PLAN:  Ms.. Tomasso is a pleasant 67 y.o. female with Stage 0 left breast DCIS, ER+/PR+, diagnosed in October 2023, treated with lumpectomy, adjuvant radiation therapy, and anti-estrogen therapy with anastrozole beginning in 2024.  Her last DEXA scan was 03/03/2022 and was normal.  We have discussed today about considering switching to tamoxifen.  I once again discussed the mechanism of action of tamoxifen, adverse effects including but not limited to postmenopausal symptoms such as hot flashes, vaginal discharge, small risk of DVT/PE, symptoms and signs of DVT/PE, benefit on bone density etc.  She is willing to try this.  I have given her prescription to the pharmacy of her choice.   I will call her back in approximately 2 months to see how she is doing. She was encouraged to call us with any new questions or concerns. Total time: 30 min  *Total Encounter Time as defined by the Centers for Medicare and Medicaid Services includes, in addition to the face-to-face time of a patient visit (documented in the note above) non-face-to-face time: obtaining and reviewing outside history, ordering and reviewing medications, tests or procedures, care coordination (communications with other health care professionals or caregivers) and documentation in the medical record.

## 2022-11-20 ENCOUNTER — Telehealth: Payer: Self-pay | Admitting: Hematology and Oncology

## 2022-11-20 NOTE — Telephone Encounter (Signed)
Spoke with patient confirming upcoming appointment  

## 2022-12-08 NOTE — H&P (Signed)
TOTAL KNEE REVISION ADMISSION H&P  Patient is being admitted for right total knee arthroplasty revision  Subjective:  Chief Complaint: Right knee pain.  HPI: Rebecca [name], 67 y.o. female presents for pre-operative visit in preparation for their right knee femoral versus TKA Revision, which is scheduled on 12/28/2022 with Dr. Lequita Halt at [facility]. The patient has had symptoms in the right knee including pain which has impacted their quality of life and ability to do activities of daily living. The patient currently has a diagnosis of failed right total knee arthroplasty. The patient has had a bone scan, which showed increased uptake at the femoral component. The patient denies an active infection.  Patient Active Problem List   Diagnosis Date Noted   Genetic testing 04/01/2022   Ductal carcinoma in situ (DCIS) of left breast 03/27/2022   OA (osteoarthritis) of knee 08/18/2021   S/P total knee arthroplasty, right 08/18/2021   Prediabetes 12/29/2019   Vitamin D deficiency 12/29/2019   Class 2 obesity due to excess calories with serious comorbidity and body mass index (BMI) of 38.0 to 38.9 in adult 08/31/2016   Hyperlipidemia 05/04/2016   Essential hypertension, benign 12/26/2012   Asthma 12/26/2012   OSA (obstructive sleep apnea) 10/05/2011    Past Medical History:  Diagnosis Date   Arthritis    Asthma    Blindness, legal    L EYE   Breast cancer (HCC)    GERD (gastroesophageal reflux disease)    Hearing loss    History of kidney stones    Hyperlipidemia    Hypertension    Lapband May 2013 11/18/2011   Migraines    Obesity, Class III, BMI 40-49.9 (morbid obesity) (HCC) 06/03/2011   Pre-diabetes    Sleep apnea    uses c-pap   Vertigo     Past Surgical History:  Procedure Laterality Date   ABDOMINAL HYSTERECTOMY  2006   fibroids   BREAST LUMPECTOMY WITH RADIOACTIVE SEED LOCALIZATION Left 04/21/2022   Procedure: LEFT BREAST LUMPECTOMY WITH RADIOACTIVE SEED  LOCALIZATION;  Surgeon: Manus Rudd, MD;  Location: [facility];  Service: General;  Laterality: Left;   BREATH TEK H PYLORI  08/17/2011   Procedure: BREATH TEK H PYLORI;  Surgeon: Valarie Merino, MD;  Location: [facility];  Service: General;  Laterality: N/A;  PATIENT WILL COME AT 0715   COLONOSCOPY  2008   @ [facility]    EYE SURGERY     Patient unsure of surgery date. Left eye   KNEE SURGERY  1992   right   LAPAROSCOPIC GASTRIC BANDING  10/20/2011   Procedure: LAPAROSCOPIC GASTRIC BANDING;  Surgeon: Valarie Merino, MD;  Location: [facility];  Service: General;  Laterality: N/A;   RE-EXCISION OF BREAST LUMPECTOMY Left 05/05/2022   Procedure: RE-EXCISION OF LEFT BREAST LUMPECTOMY SITE POSTERIOR MARGIN;  Surgeon: Manus Rudd, MD;  Location: [facility];  Service: General;  Laterality: Left;   TOTAL KNEE ARTHROPLASTY Right 08/18/2021   Procedure: TOTAL KNEE ARTHROPLASTY;  Surgeon: Ollen Gross, MD;  Location: [facility];  Service: Orthopedics;  Laterality: Right;    Prior to Admission medications   Medication Sig Start Date End Date Taking? Authorizing Provider  albuterol (PROVENTIL) (2.5 MG/3ML) 0.083% nebulizer solution Take 3 mLs (2.5 mg total) by nebulization every 4 (four) hours as needed for wheezing or shortness of breath. 11/15/19   Marcelyn Bruins, MD  albuterol (VENTOLIN HFA) 108 (90 Base) MCG/ACT inhaler Use 2 puffs every 4 hours as  needed for cough or wheeze.  May use  2 puffs 10-20 minutes prior to exercise. 08/01/21   Marcelyn Bruins, MD  ALVESCO 160 MCG/ACT inhaler Inhale 1 puff into the lungs 2 (two) times daily. 08/01/21   Marcelyn Bruins, MD  cyclobenzaprine (FLEXERIL) 5 MG tablet Take 1 tablet (5 mg total) by mouth 3 (three) times daily as needed for muscle spasms. 10/28/22   [name], MD  hydrochlorothiazide (HYDRODIURIL) 12.5 MG tablet TAKE 1 TABLET BY MOUTH EVERY DAY 07/08/22   [name], MD  ibuprofen (ADVIL)  800 MG tablet Take 800 mg by mouth 2 (two) times daily as needed.    [provider]  lisinopril (ZESTRIL) 30 MG tablet TAKE 1 TABLET BY MOUTH EVERY DAY 04/20/22   [name], MD  montelukast (SINGULAIR) 10 MG tablet TAKE 1 TABLET BY MOUTH EVERYDAY AT BEDTIME 02/09/22   Padgett, Pilar Grammes, MD  Multiple Vitamins-Minerals (MULTIVITAMIN WITH MINERALS) tablet Take 1 tablet by mouth daily with breakfast.    [provider]  tamoxifen (NOLVADEX) 20 MG tablet Take 1 tablet (20 mg total) by mouth daily. 11/16/22   Rachel Moulds, MD  vitamin B-12 (CYANOCOBALAMIN) 100 MCG tablet Take 100 mcg by mouth daily.    [provider]    Allergies  Allergen Reactions   Hydrocodone Hives, Shortness Of Breath and Itching   Sulfa Antibiotics Hives    Social History   Socioeconomic History   Marital status: Married    Spouse name: Not on file   Number of children: Not on file   Years of education: Not on file   Highest education level: Not on file  Occupational History   Not on file  Tobacco Use   Smoking status: Former    Packs/day: 0.50    Years: 15.00    Additional pack years: 0.00    Total pack years: 7.50    Types: Cigarettes    Quit date: 06/02/1994    Years since quitting: 28.5   Smokeless tobacco: Never  Vaping Use   Vaping Use: Never used  Substance and Sexual Activity   Alcohol use: No   Drug use: No   Sexual activity: Not on file    Comment: hyst  Other Topics Concern   Not on file  Social History Narrative   Work or School: works part time with [employer] - 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       Home Situation: lives with husband      Spiritual Beliefs: Christian  - Gaffer, Higher education careers adviser      Lifestyle: wt watchers 2019, starting to exercise         Social Determinants of Health   Financial Resource Strain: Low Risk  (04/01/2022)   Overall Financial Resource Strain (CARDIA)    Difficulty of Paying  Living Expenses: Not very hard  Food Insecurity: No Food Insecurity (04/01/2022)   Hunger Vital Sign    Worried About Running Out of Food in the Last Year: Never true    Ran Out of Food in the Last Year: Never true  Transportation Needs: No Transportation Needs (04/01/2022)   PRAPARE - Administrator, Civil Service (Medical): No    Lack of Transportation (Non-Medical): No  Physical Activity: Inactive (10/06/2021)   Exercise Vital Sign    Days of Exercise per Week: 0 days    Minutes of Exercise per Session: 0 min  Stress: No Stress Concern  Present (10/06/2021)   [employer] of Occupational Health - Occupational Stress Questionnaire    Feeling of Stress : Not at all  Social Connections: Not on file  Intimate Partner Violence: Not on file    Tobacco Use: Medium Risk (10/28/2022)   Patient History    Smoking Tobacco Use: Former    Smokeless Tobacco Use: Never    Passive Exposure: Not on file   Social History   Substance and Sexual Activity  Alcohol Use No    Family History  Problem Relation Age of Onset   Breast cancer Mother 47   Alcohol abuse Father    Heart disease Father 16       MI   Hypertension Father    Colon cancer Brother        dx after 74   Breast cancer Daughter 65    Review of Systems  Constitutional:  Negative for chills and fever.  HENT:  Negative for congestion, sore throat and tinnitus.   Eyes:  Negative for double vision, photophobia and pain.  Respiratory:  Negative for cough, shortness of breath and wheezing.   Cardiovascular:  Negative for chest pain, palpitations and orthopnea.  Gastrointestinal:  Negative for heartburn, nausea and vomiting.  Genitourinary:  Negative for dysuria, frequency and urgency.  Musculoskeletal:  Positive for joint pain.  Neurological:  Negative for dizziness, weakness and headaches.    Objective:  Physical Exam: Well nourished and well developed.  General: Alert and oriented x3, cooperative and  pleasant, no acute distress.  Head: normocephalic, atraumatic, neck supple.  Eyes: EOMI.  Musculoskeletal:  Right Knee Exam:  No warmth or effusion present.  The range of motion is: 0 to 125 degrees.  No crepitus on range of motion of the knee.  No medial joint line tenderness.  No lateral joint line tenderness.  Slight varus/valgus at 20 to 30 degrees of flexion, but in full extension, she has great stability.  Minimal AP laxity at 90 degrees of flexion, but minimal.  No specific tenderness about the knee.  Calves soft and nontender. Motor function intact in LE. Strength 5/5 LE bilaterally. Neuro: Distal pulses 2+. Sensation to light touch intact in LE.  Imaging Review Plain radiographs show the prosthesis in good position with no noticeable loosening.  Bone scan revealed increased uptake at the femoral component.   Assessment/Plan:  Failed right total knee arthroplasty  The treatment options including medical management,arthroscopy and arthroplasty revision were discussed at length. The risks and benefits of total knee arthroplasty revision were presented and reviewed. The risks due to aseptic loosening, infection, stiffness, patella tracking problems, thromboembolic complications and other imponderables were discussed. The patient acknowledged the explanation, agreed to proceed with the plan and consent was signed. Patient is being admitted for inpatient treatment for surgery, pain control, PT, OT, prophylactic antibiotics, VTE prophylaxis, progressive ambulation and ADLs and discharge planning. The patient is planning to be discharged  home .  Therapy Plans: Outpatient therapy at [facility]) Disposition: Home with husband Planned DVT Prophylaxis: Xarelto 10 mg QD DME Needed: None PCP: Abbe Amsterdam, MD (clearance received) TXA: IV Allergies: Sulfa Anesthesia Concerns: Sleep apnea BMI: 36.7 Last HgbA1c: Not diabetic Pharmacy: [facility])  Other: - No IV sticks  or BPs left arm - No gabapentin (didn't help with original TKA) - does not want postop - Oxycodone, tramadol, xarelto, methocarbamol  - Patient was instructed on what medications to stop prior to surgery. - Follow-up visit in 2 weeks with Dr. Lequita Halt -  Begin physical therapy following surgery - Pre-operative lab work as pre-surgical testing - Prescriptions will be provided in hospital at time of discharge  Arther Abbott, PA-C Orthopedic Surgery [facility] [place]

## 2022-12-10 ENCOUNTER — Ambulatory Visit: Payer: Medicare Other | Admitting: Podiatry

## 2022-12-20 NOTE — Patient Instructions (Signed)
SURGICAL WAITING ROOM VISITATION Patients having surgery or a procedure may have no more than 2 support people in the waiting area - these visitors may rotate in the visitor waiting room.   Due to an increase in RSV and influenza rates and associated hospitalizations, children ages 56 and under may not visit patients in [facility] hospitals. If the patient needs to stay at the hospital during part of their recovery, the visitor guidelines for inpatient rooms apply.  PRE-OP VISITATION  Pre-op nurse will coordinate an appropriate time for 1 support person to accompany the patient in pre-op.  This support person may not rotate.  This visitor will be contacted when the time is appropriate for the visitor to come back in the pre-op area.  Please refer to the [facility] website for the visitor guidelines for Inpatients (after your surgery is over and you are in a regular room).  You are not required to quarantine at this time prior to your surgery. However, you must do this: Hand Hygiene often Do NOT share personal items Notify your provider if you are in close contact with someone who has COVID or you develop fever 100.4 or greater, new onset of sneezing, cough, sore throat, shortness of breath or body aches.  If you test positive for Covid or have been in contact with anyone that has tested positive in the last 10 days please notify you surgeon.    Your procedure is scheduled on:  Monday   December 28, 2022  Report to [facility] Main Entrance: [place] entrance where the [employer] is available.   Report to admitting at:  10:00   AM  Call this number if you have any questions or problems the morning of surgery [phone]  Do not eat food after Midnight the night prior to your surgery/procedure.  After Midnight you may have the following liquids until  09:30  AM DAY OF SURGERY  Clear Liquid Diet Water Black Coffee (sugar ok, NO MILK/CREAM OR CREAMERS)  Tea (sugar ok, NO  MILK/CREAM OR CREAMERS) regular and decaf                             Plain Jell-O  with no fruit (NO RED)                                           Fruit ices (not with fruit pulp, NO RED)                                     Popsicles (NO RED)                                                                  Juice: NO CITRUS JUICES: only apple, WHITE grape, WHITE cranberry Sports drinks like Gatorade or Powerade (NO RED)                     The day of surgery:  Drink ONE (1) Pre-Surgery G2 at  09:30 AM the morning  of surgery. Drink in one sitting. Do not sip.  This drink was given to you during your hospital pre-op appointment visit. Nothing else to drink after completing the Pre-SurgeryG2 : No candy, chewing gum or throat lozenges.    FOLLOW  ANY ADDITIONAL PRE OP INSTRUCTIONS YOU RECEIVED FROM YOUR SURGEON'S OFFICE!!!   Oral Hygiene is also important to reduce your risk of infection.        Remember - BRUSH YOUR TEETH THE MORNING OF SURGERY WITH YOUR REGULAR TOOTHPASTE  Do NOT smoke after Midnight the night before surgery.  Take ONLY these medicines the morning of surgery with A SIP OF WATER: none,  You may use your inhalers and nebulizer if needed.    If You have been diagnosed with Sleep Apnea - Bring CPAP mask and tubing day of surgery. We will provide you with a CPAP machine on the day of your surgery.                   You may not have any metal on your body including hair pins, jewelry, and body piercing  Do not wear make-up, lotions, powders, perfumes or deodorant  Do not wear nail polish including gel and S&S, artificial / acrylic nails, or any other type of covering on natural nails including finger and toenails. If you have artificial nails, gel coating, etc., that needs to be removed by a nail salon, Please have this removed prior to surgery. Not doing so may mean that your surgery could be cancelled or delayed if the Surgeon or anesthesia staff feels like they are  unable to monitor you safely.   Do not shave 48 hours prior to surgery to avoid nicks in your skin which may contribute to postoperative infections.   You may bring a small overnight bag with you on the day of surgery, only pack items that are not valuable. [facility] IS NOT RESPONSIBLE   FOR VALUABLES THAT ARE LOST OR STOLEN.   Do not bring your home medications to the hospital. The Pharmacy will dispense medications listed on your medication list to you during your admission in the Hospital.  Please read over the following fact sheets you were given: IF YOU HAVE QUESTIONS ABOUT YOUR PRE-OP INSTRUCTIONS, PLEASE CALL [phone].        Pre-operative 5 CHG Bath Instructions   You can play a key role in reducing the risk of infection after surgery. Your skin needs to be as free of germs as possible. You can reduce the number of germs on your skin by washing with CHG (chlorhexidine gluconate) soap before surgery. CHG is an antiseptic soap that kills germs and continues to kill germs even after washing.   DO NOT use if you have an allergy to chlorhexidine/CHG or antibacterial soaps. If your skin becomes reddened or irritated, stop using the CHG and notify one of our RNs at [phone]  Please shower with the CHG soap starting 4 days before surgery using the following schedule: START SHOWERS ON  THURSDAY  December 24, 2022  Please keep in mind the following:  DO NOT shave, including legs and underarms, starting the day of your first shower.   You may shave your face at any point before/day of surgery.   Place clean sheets on your bed the day you start using CHG soap. Use a clean washcloth (not used since being washed) for each shower. DO NOT sleep with pets once you start using the CHG.   CHG Shower  Instructions:  If you choose to wash your hair and private area, wash first with your normal shampoo/soap.  After you use shampoo/soap, rinse your hair and body thoroughly to remove shampoo/soap residue.  Turn the water OFF and apply about 3 tablespoons (45 ml) of CHG soap to a CLEAN washcloth.  Apply CHG soap ONLY FROM YOUR NECK DOWN TO YOUR TOES (washing for 3-5 minutes)  DO NOT use CHG soap on face, private areas, open wounds, or sores.  Pay special attention to the area where your surgery is being performed.  If you are having back surgery, having someone wash your back for you may be helpful.  Wait 2 minutes after CHG soap is applied, then you may rinse off the CHG soap.  Pat dry with a clean towel  Put on clean clothes/pajamas   If you choose to wear lotion, please use ONLY the CHG-compatible lotions on the back of this paper.     Additional instructions for the day of surgery: DO NOT APPLY any lotions, deodorants, cologne, or perfumes.   Put on clean/comfortable clothes.  Brush your teeth.  Ask your nurse before applying any prescription medications to the skin.      CHG Compatible Lotions   Aveeno Moisturizing lotion  Cetaphil Moisturizing Cream  Cetaphil Moisturizing Lotion  Clairol Herbal Essence Moisturizing Lotion, Dry Skin  Clairol Herbal Essence Moisturizing Lotion, Extra Dry Skin  Clairol Herbal Essence Moisturizing Lotion, Normal Skin  Curel Age Defying Therapeutic Moisturizing Lotion with Alpha Hydroxy  Curel Extreme Care Body Lotion  Curel Soothing Hands Moisturizing Hand Lotion  Curel Therapeutic Moisturizing Cream, Fragrance-Free  Curel Therapeutic Moisturizing Lotion, Fragrance-Free  Curel Therapeutic Moisturizing Lotion, Original Formula  Eucerin Daily Replenishing Lotion  Eucerin Dry Skin Therapy Plus Alpha Hydroxy Crme  Eucerin Dry Skin Therapy Plus Alpha Hydroxy Lotion  Eucerin Original Crme  Eucerin Original Lotion  Eucerin Plus Crme Eucerin  Plus Lotion  Eucerin TriLipid Replenishing Lotion  Keri Anti-Bacterial Hand Lotion  Keri Deep Conditioning Original Lotion Dry Skin Formula Softly Scented  Keri Deep Conditioning Original Lotion, Fragrance Free Sensitive Skin Formula  Keri Lotion Fast Absorbing Fragrance Free Sensitive Skin Formula  Keri Lotion Fast Absorbing Softly Scented Dry Skin Formula  Keri Original Lotion  Keri Skin Renewal Lotion Keri Silky Smooth Lotion  Keri Silky Smooth Sensitive Skin Lotion  Nivea Body Creamy Conditioning Oil  Nivea Body Extra Enriched Lotion  Nivea Body Original Lotion  Nivea Body Sheer Moisturizing Lotion Nivea Crme  Nivea Skin Firming Lotion  NutraDerm 30 Skin Lotion  NutraDerm Skin Lotion  NutraDerm Therapeutic Skin Cream  NutraDerm Therapeutic Skin Lotion  ProShield Protective Hand Cream  Provon moisturizing lotion   FAILURE TO FOLLOW THESE INSTRUCTIONS MAY RESULT IN THE CANCELLATION OF YOUR SURGERY  PATIENT SIGNATURE_________________________________  NURSE SIGNATURE__________________________________  ________________________________________________________________________      Rogelia Mire    An incentive spirometer is a tool that can help keep your lungs clear and active. This tool measures how well you are filling your lungs with each breath. Taking long  deep breaths may help reverse or decrease the chance of developing breathing (pulmonary) problems (especially infection) following: A long period of time when you are unable to move or be active. BEFORE THE PROCEDURE  If the spirometer includes an indicator to show your best effort, your nurse or respiratory therapist will set it to a desired goal. If possible, sit up straight or lean slightly forward. Try not to slouch. Hold the incentive spirometer in an upright position. INSTRUCTIONS FOR USE  Sit on the edge of your bed if possible, or sit up as far as you can in bed or on a chair. Hold the incentive  spirometer in an upright position. Breathe out normally. Place the mouthpiece in your mouth and seal your lips tightly around it. Breathe in slowly and as deeply as possible, raising the piston or the ball toward the top of the column. Hold your breath for 3-5 seconds or for as long as possible. Allow the piston or ball to fall to the bottom of the column. Remove the mouthpiece from your mouth and breathe out normally. Rest for a few seconds and repeat Steps 1 through 7 at least 10 times every 1-2 hours when you are awake. Take your time and take a few normal breaths between deep breaths. The spirometer may include an indicator to show your best effort. Use the indicator as a goal to work toward during each repetition. After each set of 10 deep breaths, practice coughing to be sure your lungs are clear. If you have an incision (the cut made at the time of surgery), support your incision when coughing by placing a pillow or rolled up towels firmly against it. Once you are able to get out of bed, walk around indoors and cough well. You may stop using the incentive spirometer when instructed by your caregiver.  RISKS AND COMPLICATIONS Take your time so you do not get dizzy or light-headed. If you are in pain, you may need to take or ask for pain medication before doing incentive spirometry. It is harder to take a deep breath if you are having pain. AFTER USE Rest and breathe slowly and easily. It can be helpful to keep track of a log of your progress. Your caregiver can provide you with a simple table to help with this. If you are using the spirometer at home, follow these instructions: SEEK MEDICAL CARE IF:  You are having difficultly using the spirometer. You have trouble using the spirometer as often as instructed. Your pain medication is not giving enough relief while using the spirometer. You develop fever of 100.5 F (38.1 C) or higher.                                                                                                     SEEK IMMEDIATE MEDICAL CARE IF:  You cough up bloody sputum that had not been present before. You develop fever of 102 F (38.9 C) or greater. You develop worsening pain at or near the incision site. MAKE SURE YOU:  Understand these instructions. Will watch your condition. Will  get help right away if you are not doing well or get worse. Document Released: 10/12/2006 Document Revised: 08/24/2011 Document Reviewed: 12/13/2006 [facility] Patient Information 2014 [place], [place].     WHAT IS A BLOOD TRANSFUSION? Blood Transfusion Information  A transfusion is the replacement of blood or some of its parts. Blood is made up of multiple cells which provide different functions. Red blood cells carry oxygen and are used for blood loss replacement. White blood cells fight against infection. Platelets control bleeding. Plasma helps clot blood. Other blood products are available for specialized needs, such as hemophilia or other clotting disorders. BEFORE THE TRANSFUSION  Who gives blood for transfusions?  Healthy volunteers who are fully evaluated to make sure their blood is safe. This is blood bank blood. Transfusion therapy is the safest it has ever been in the practice of medicine. Before blood is taken from a donor, a complete history is taken to make sure that person has no history of diseases nor engages in risky social behavior (examples are intravenous drug use or sexual activity with multiple partners). The donor's travel history is screened to minimize risk of transmitting infections, such as malaria. The donated blood is tested for signs of infectious diseases, such as HIV and hepatitis. The blood is then tested to be sure it is compatible with you in order to minimize the chance of a transfusion reaction. If you or a relative donates blood, this is often done in anticipation of surgery and is not appropriate for emergency situations. It takes many  days to process the donated blood. RISKS AND COMPLICATIONS Although transfusion therapy is very safe and saves many lives, the main dangers of transfusion include:  Getting an infectious disease. Developing a transfusion reaction. This is an allergic reaction to something in the blood you were given. Every precaution is taken to prevent this. The decision to have a blood transfusion has been considered carefully by your caregiver before blood is given. Blood is not given unless the benefits outweigh the risks. AFTER THE TRANSFUSION Right after receiving a blood transfusion, you will usually feel much better and more energetic. This is especially true if your red blood cells have gotten low (anemic). The transfusion raises the level of the red blood cells which carry oxygen, and this usually causes an energy increase. The nurse administering the transfusion will monitor you carefully for complications. HOME CARE INSTRUCTIONS  No special instructions are needed after a transfusion. You may find your energy is better. Speak with your caregiver about any limitations on activity for underlying diseases you may have. SEEK MEDICAL CARE IF:  Your condition is not improving after your transfusion. You develop redness or irritation at the intravenous (IV) site. SEEK IMMEDIATE MEDICAL CARE IF:  Any of the following symptoms occur over the next 12 hours: Shaking chills. You have a temperature by mouth above 102 F (38.9 C), not controlled by medicine. Chest, back, or muscle pain. People around you feel you are not acting correctly or are confused. Shortness of breath or difficulty breathing. Dizziness and fainting. You get a rash or develop hives. You have a decrease in urine output. Your urine turns a dark color or changes to pink, red, or brown. Any of the following symptoms occur over the next 10 days: You have a temperature by mouth above 102 F (38.9 C), not controlled by medicine. Shortness of  breath. Weakness after normal activity. The white part of the eye turns yellow (jaundice). You have a decrease in  the amount of urine or are urinating less often. Your urine turns a dark color or changes to pink, red, or brown. Document Released: 05/29/2000 Document Revised: 08/24/2011 Document Reviewed: 01/16/2008 [facility] Patient Information 2014 [place], [place].          _______________________________________________________________________

## 2022-12-20 NOTE — Progress Notes (Signed)
COVID Vaccine received:  []  No [x]  Yes Date of any COVID positive Test in last 90 days:  PCP - Abbe Amsterdam, MD Cardiologist -  Oncology- Rachel Moulds, MD  Chest x-ray - 02-04-2022  2v  Epic EKG - 02-04-2022  Epic  Stress Test -  ECHO -  Cardiac Cath -   PCR screen: [x]  Ordered & Completed           []   No Order but Needs PROFEND           []   N/A for this surgery  Surgery Plan:  []  Ambulatory                            []  Outpatient in bed                            [x]  Admit  Anesthesia:    []  General  []  Spinal                           [x]   Choice []   MAC  Pacemaker / ICD device [x]  No []  Yes   Spinal Cord Stimulator:[x]  No []  Yes       History of Sleep Apnea? []  No [x]  Yes   CPAP used?- []  No [x]  Yes    Does the patient monitor blood sugar?          []  No []  Yes  []  N/A  Patient has: []  NO Hx DM   [x]  Pre-DM                 []  DM1  []   DM2 Does patient have a [employer] or [employer]? []  No []  Yes   Fasting Blood Sugar Ranges-  Checks Blood Sugar _____ times a day  Blood Thinner / Instructions: none Aspirin Instructions:  none  ERAS Protocol Ordered: []  No  [x]  Yes PRE-SURGERY []  ENSURE  [x]  G2  Patient is to be NPO after: 09:30  Comments: Patient was given the 5 CHG shower / bath instructions for TKA surgery along with 2 bottles of the CHG soap. Patient will start this on: Thursday   12-24-2022   All questions were asked and answered, Patient voiced understanding of this proceass.   Activity level: Patient is able / unable to climb a flight of stairs without difficulty; []  No CP  []  No SOB, but would have ___   Patient can / can not perform ADLs without assistance.   Anesthesia review: OSA-CPAP, HTN, s/p left breast lumpectomy, radiation, asthma, Blind in left eye, GERD, Pre-DM- no meds  Patient denies shortness of breath, fever, cough and chest pain at PAT appointment.  Patient verbalized understanding and agreement to the Pre-Surgical Instructions  that were given to them at this PAT appointment. Patient was also educated of the need to review these PAT instructions again prior to her surgery.I reviewed the appropriate phone numbers to call if they have any and questions or concerns.

## 2022-12-22 ENCOUNTER — Encounter (HOSPITAL_COMMUNITY): Payer: Self-pay

## 2022-12-22 ENCOUNTER — Other Ambulatory Visit: Payer: Self-pay

## 2022-12-22 ENCOUNTER — Encounter (HOSPITAL_COMMUNITY)
Admission: RE | Admit: 2022-12-22 | Discharge: 2022-12-22 | Disposition: A | Discharge status: Home / Self Care | Payer: 59 | Source: Ambulatory Visit | Attending: Orthopedic Surgery | Admitting: Orthopedic Surgery

## 2022-12-22 VITALS — BP 159/87 | HR 72 | Temp 98.6°F | Resp 20 | Ht 63.0 in | Wt 196.0 lb

## 2022-12-22 DIAGNOSIS — Z01812 Encounter for preprocedural laboratory examination: Secondary | ICD-10-CM | POA: Diagnosis not present

## 2022-12-22 DIAGNOSIS — I10 Essential (primary) hypertension: Secondary | ICD-10-CM | POA: Diagnosis not present

## 2022-12-22 DIAGNOSIS — Z01818 Encounter for other preprocedural examination: Secondary | ICD-10-CM

## 2022-12-22 DIAGNOSIS — R7303 Prediabetes: Secondary | ICD-10-CM | POA: Diagnosis not present

## 2022-12-22 LAB — CBC
HCT: 38.7 % (ref 36.0–46.0)
Hemoglobin: 12.6 g/dL (ref 12.0–15.0)
MCH: 30.5 pg (ref 26.0–34.0)
MCHC: 32.6 g/dL (ref 30.0–36.0)
MCV: 93.7 fL (ref 80.0–100.0)
Platelets: 358 10*3/uL (ref 150–400)
RBC: 4.13 MIL/uL (ref 3.87–5.11)
RDW: 13 % (ref 11.5–15.5)
WBC: 6.2 10*3/uL (ref 4.0–10.5)
nRBC: 0 % (ref 0.0–0.2)

## 2022-12-22 LAB — BASIC METABOLIC PANEL
Anion gap: 9 (ref 5–15)
BUN: 18 mg/dL (ref 8–23)
CO2: 25 mmol/L (ref 22–32)
Calcium: 9.2 mg/dL (ref 8.9–10.3)
Chloride: 109 mmol/L (ref 98–111)
Creatinine, Ser: 0.77 mg/dL (ref 0.44–1.00)
GFR, Estimated: >60 mL/min (ref >60)
Glucose, Bld: 104 mg/dL — ABNORMAL HIGH (ref 70–99)
Potassium: 4.1 mmol/L (ref 3.5–5.1)
Sodium: 143 mmol/L (ref 135–145)

## 2022-12-22 LAB — SURGICAL PCR SCREEN
MRSA, PCR: NEGATIVE
Staphylococcus aureus: NEGATIVE

## 2022-12-22 MED ORDER — IOHEXOL 9 MG/ML PO SOLN
ORAL | Status: AC
  Filled 2022-12-22: qty 1000

## 2022-12-28 ENCOUNTER — Other Ambulatory Visit: Payer: Self-pay

## 2022-12-28 ENCOUNTER — Encounter (HOSPITAL_COMMUNITY)
Admission: RE | Disposition: A | Discharge status: Home / Self Care | Payer: Self-pay | Source: Ambulatory Visit | Attending: Orthopedic Surgery

## 2022-12-28 ENCOUNTER — Inpatient Hospital Stay (HOSPITAL_COMMUNITY): Payer: 59 | Admitting: Physician Assistant

## 2022-12-28 ENCOUNTER — Inpatient Hospital Stay (HOSPITAL_COMMUNITY): Payer: 59 | Admitting: Anesthesiology

## 2022-12-28 ENCOUNTER — Encounter (HOSPITAL_COMMUNITY): Payer: Self-pay | Admitting: Orthopedic Surgery

## 2022-12-28 ENCOUNTER — Inpatient Hospital Stay (HOSPITAL_COMMUNITY)
Admission: RE | Admit: 2022-12-28 | Discharge: 2022-12-31 | DRG: 468 | Disposition: A | Discharge status: Home / Self Care | Payer: 59 | Source: Ambulatory Visit | Attending: Orthopedic Surgery | Admitting: Orthopedic Surgery

## 2022-12-28 DIAGNOSIS — Z8249 Family history of ischemic heart disease and other diseases of the circulatory system: Secondary | ICD-10-CM | POA: Diagnosis not present

## 2022-12-28 DIAGNOSIS — Z6834 Body mass index (BMI) 34.0-34.9, adult: Secondary | ICD-10-CM | POA: Diagnosis not present

## 2022-12-28 DIAGNOSIS — Z9884 Bariatric surgery status: Secondary | ICD-10-CM | POA: Diagnosis not present

## 2022-12-28 DIAGNOSIS — G4733 Obstructive sleep apnea (adult) (pediatric): Secondary | ICD-10-CM | POA: Diagnosis present

## 2022-12-28 DIAGNOSIS — J45909 Unspecified asthma, uncomplicated: Secondary | ICD-10-CM | POA: Diagnosis present

## 2022-12-28 DIAGNOSIS — Z885 Allergy status to narcotic agent status: Secondary | ICD-10-CM

## 2022-12-28 DIAGNOSIS — Z853 Personal history of malignant neoplasm of breast: Secondary | ICD-10-CM | POA: Diagnosis not present

## 2022-12-28 DIAGNOSIS — H548 Legal blindness, as defined in USA: Secondary | ICD-10-CM | POA: Diagnosis present

## 2022-12-28 DIAGNOSIS — Z79811 Long term (current) use of aromatase inhibitors: Secondary | ICD-10-CM | POA: Diagnosis not present

## 2022-12-28 DIAGNOSIS — Z800 Family history of malignant neoplasm of digestive organs: Secondary | ICD-10-CM | POA: Diagnosis not present

## 2022-12-28 DIAGNOSIS — T84022A Instability of internal right knee prosthesis, initial encounter: Secondary | ICD-10-CM | POA: Diagnosis present

## 2022-12-28 DIAGNOSIS — Z811 Family history of alcohol abuse and dependence: Secondary | ICD-10-CM

## 2022-12-28 DIAGNOSIS — E785 Hyperlipidemia, unspecified: Secondary | ICD-10-CM | POA: Diagnosis present

## 2022-12-28 DIAGNOSIS — Z79899 Other long term (current) drug therapy: Secondary | ICD-10-CM

## 2022-12-28 DIAGNOSIS — Y792 Prosthetic and other implants, materials and accessory orthopedic devices associated with adverse incidents: Secondary | ICD-10-CM | POA: Diagnosis present

## 2022-12-28 DIAGNOSIS — Z882 Allergy status to sulfonamides status: Secondary | ICD-10-CM

## 2022-12-28 DIAGNOSIS — T84092A Other mechanical complication of internal right knee prosthesis, initial encounter: Secondary | ICD-10-CM

## 2022-12-28 DIAGNOSIS — K219 Gastro-esophageal reflux disease without esophagitis: Secondary | ICD-10-CM | POA: Diagnosis present

## 2022-12-28 DIAGNOSIS — T84012A Broken internal right knee prosthesis, initial encounter: Secondary | ICD-10-CM | POA: Diagnosis present

## 2022-12-28 DIAGNOSIS — Z87891 Personal history of nicotine dependence: Secondary | ICD-10-CM | POA: Diagnosis not present

## 2022-12-28 DIAGNOSIS — I10 Essential (primary) hypertension: Secondary | ICD-10-CM | POA: Diagnosis present

## 2022-12-28 DIAGNOSIS — H9190 Unspecified hearing loss, unspecified ear: Secondary | ICD-10-CM | POA: Diagnosis present

## 2022-12-28 DIAGNOSIS — Z803 Family history of malignant neoplasm of breast: Secondary | ICD-10-CM | POA: Diagnosis not present

## 2022-12-28 DIAGNOSIS — R7303 Prediabetes: Secondary | ICD-10-CM | POA: Diagnosis present

## 2022-12-28 DIAGNOSIS — Z96659 Presence of unspecified artificial knee joint: Secondary | ICD-10-CM

## 2022-12-28 HISTORY — PX: TOTAL KNEE REVISION: SHX996

## 2022-12-28 LAB — TYPE AND SCREEN
ABO/RH(D): B POS
Antibody Screen: NEGATIVE

## 2022-12-28 LAB — ABO/RH: ABO/RH(D): B POS

## 2022-12-28 SURGERY — TOTAL KNEE REVISION
Anesthesia: Spinal | Site: Knee | Laterality: Right

## 2022-12-28 MED ORDER — ALBUTEROL SULFATE (2.5 MG/3ML) 0.083% IN NEBU: 2.5000 mg | INHALATION_SOLUTION | RESPIRATORY_TRACT | Status: DC | PRN

## 2022-12-28 MED ORDER — PHENYLEPHRINE HCL-NACL 20-0.9 MG/250ML-% IV SOLN
INTRAVENOUS | Status: DC | PRN
  Administered 2022-12-28: 25 ug/min via INTRAVENOUS

## 2022-12-28 MED ORDER — LACTATED RINGERS IV SOLN: INTRAVENOUS | Status: DC | PRN

## 2022-12-28 MED ORDER — MENTHOL 3 MG MT LOZG: 1.0000 | LOZENGE | OROMUCOSAL | Status: DC | PRN

## 2022-12-28 MED ORDER — ACETAMINOPHEN 500 MG PO TABS
1000.0000 mg | ORAL_TABLET | Freq: Four times a day (QID) | ORAL | Status: AC
  Administered 2022-12-28 – 2022-12-29 (×4): 1000 mg via ORAL
  Filled 2022-12-28 (×4): qty 2

## 2022-12-28 MED ORDER — OXYCODONE HCL 5 MG PO TABS: 5.0000 mg | ORAL_TABLET | Freq: Once | ORAL | Status: DC | PRN

## 2022-12-28 MED ORDER — SODIUM CHLORIDE (PF) 0.9 % IJ SOLN
INTRAMUSCULAR | Status: AC
  Filled 2022-12-28: qty 10

## 2022-12-28 MED ORDER — OXYCODONE HCL 5 MG/5ML PO SOLN: 5.0000 mg | Freq: Once | ORAL | Status: DC | PRN

## 2022-12-28 MED ORDER — CEFAZOLIN SODIUM-DEXTROSE 2-4 GM/100ML-% IV SOLN
2.0000 g | INTRAVENOUS | Status: AC
  Administered 2022-12-28: 2 g via INTRAVENOUS
  Filled 2022-12-28: qty 100

## 2022-12-28 MED ORDER — DEXAMETHASONE SODIUM PHOSPHATE 10 MG/ML IJ SOLN
10.0000 mg | Freq: Once | INTRAMUSCULAR | Status: AC
  Administered 2022-12-29: 10 mg via INTRAVENOUS
  Filled 2022-12-28: qty 1

## 2022-12-28 MED ORDER — ONDANSETRON HCL 4 MG PO TABS: 4.0000 mg | ORAL_TABLET | Freq: Four times a day (QID) | ORAL | Status: DC | PRN

## 2022-12-28 MED ORDER — OXYCODONE HCL 5 MG PO TABS
5.0000 mg | ORAL_TABLET | ORAL | Status: DC | PRN
  Administered 2022-12-28 – 2022-12-30 (×7): 10 mg via ORAL
  Administered 2022-12-31: 5 mg via ORAL
  Administered 2022-12-31: 10 mg via ORAL
  Filled 2022-12-28 (×5): qty 2
  Filled 2022-12-28: qty 1
  Filled 2022-12-28 (×4): qty 2

## 2022-12-28 MED ORDER — FENTANYL CITRATE PF 50 MCG/ML IJ SOSY
50.0000 ug | PREFILLED_SYRINGE | INTRAMUSCULAR | Status: DC
  Administered 2022-12-28: 50 ug via INTRAVENOUS
  Filled 2022-12-28: qty 1

## 2022-12-28 MED ORDER — CHLORHEXIDINE GLUCONATE 0.12 % MT SOLN
15.0000 mL | Freq: Once | OROMUCOSAL | Status: AC
  Administered 2022-12-28: 15 mL via OROMUCOSAL

## 2022-12-28 MED ORDER — PHENOL 1.4 % MT LIQD: 1.0000 | OROMUCOSAL | Status: DC | PRN

## 2022-12-28 MED ORDER — SODIUM CHLORIDE (PF) 0.9 % IJ SOLN
INTRAMUSCULAR | Status: DC | PRN
  Administered 2022-12-28: 60 mL

## 2022-12-28 MED ORDER — LACTATED RINGERS IV SOLN: INTRAVENOUS | Status: DC

## 2022-12-28 MED ORDER — ONDANSETRON HCL 4 MG/2ML IJ SOLN
INTRAMUSCULAR | Status: DC | PRN
  Administered 2022-12-28: 4 mg via INTRAVENOUS

## 2022-12-28 MED ORDER — STERILE WATER FOR IRRIGATION IR SOLN
Status: DC | PRN
  Administered 2022-12-28: 2000 mL

## 2022-12-28 MED ORDER — ROPIVACAINE HCL 7.5 MG/ML IJ SOLN
INTRAMUSCULAR | Status: DC | PRN
  Administered 2022-12-28: 20 mL via PERINEURAL

## 2022-12-28 MED ORDER — METHOCARBAMOL 500 MG PO TABS
500.0000 mg | ORAL_TABLET | Freq: Four times a day (QID) | ORAL | Status: DC | PRN
  Administered 2022-12-28 – 2022-12-31 (×8): 500 mg via ORAL
  Filled 2022-12-28 (×8): qty 1

## 2022-12-28 MED ORDER — METOCLOPRAMIDE HCL 5 MG PO TABS: 5.0000 mg | ORAL_TABLET | Freq: Three times a day (TID) | ORAL | Status: DC | PRN

## 2022-12-28 MED ORDER — METHOCARBAMOL 500 MG IVPB - SIMPLE MED: 500.0000 mg | Freq: Four times a day (QID) | INTRAVENOUS | Status: DC | PRN

## 2022-12-28 MED ORDER — ACETAMINOPHEN 10 MG/ML IV SOLN
1000.0000 mg | Freq: Four times a day (QID) | INTRAVENOUS | Status: DC
  Administered 2022-12-28: 1000 mg via INTRAVENOUS
  Filled 2022-12-28: qty 100

## 2022-12-28 MED ORDER — BUPIVACAINE LIPOSOME 1.3 % IJ SUSP
INTRAMUSCULAR | Status: DC | PRN
  Administered 2022-12-28: 20 mL

## 2022-12-28 MED ORDER — DOCUSATE SODIUM 100 MG PO CAPS
100.0000 mg | ORAL_CAPSULE | Freq: Two times a day (BID) | ORAL | Status: DC
  Administered 2022-12-28 – 2022-12-31 (×6): 100 mg via ORAL
  Filled 2022-12-28 (×6): qty 1

## 2022-12-28 MED ORDER — PROPOFOL 1000 MG/100ML IV EMUL
INTRAVENOUS | Status: AC
  Filled 2022-12-28: qty 100

## 2022-12-28 MED ORDER — BUPIVACAINE LIPOSOME 1.3 % IJ SUSP: 20.0000 mL | Freq: Once | INTRAMUSCULAR | Status: DC

## 2022-12-28 MED ORDER — ACETAMINOPHEN 500 MG PO TABS: 1000.0000 mg | ORAL_TABLET | Freq: Once | ORAL | Status: DC

## 2022-12-28 MED ORDER — HYDROCHLOROTHIAZIDE 12.5 MG PO TABS
12.5000 mg | ORAL_TABLET | Freq: Every day | ORAL | Status: DC
  Administered 2022-12-29 – 2022-12-31 (×3): 12.5 mg via ORAL
  Filled 2022-12-28 (×3): qty 1

## 2022-12-28 MED ORDER — TRANEXAMIC ACID-NACL 1000-0.7 MG/100ML-% IV SOLN
1000.0000 mg | INTRAVENOUS | Status: DC
  Filled 2022-12-28: qty 100

## 2022-12-28 MED ORDER — BUDESONIDE 0.5 MG/2ML IN SUSP: 0.5000 mg | Freq: Two times a day (BID) | RESPIRATORY_TRACT | Status: DC | PRN

## 2022-12-28 MED ORDER — AMISULPRIDE (ANTIEMETIC) 5 MG/2ML IV SOLN
INTRAVENOUS | Status: AC
  Filled 2022-12-28: qty 4

## 2022-12-28 MED ORDER — TRAMADOL HCL 50 MG PO TABS
50.0000 mg | ORAL_TABLET | Freq: Four times a day (QID) | ORAL | Status: DC | PRN
  Administered 2022-12-29 – 2022-12-31 (×3): 100 mg via ORAL
  Filled 2022-12-28 (×3): qty 2

## 2022-12-28 MED ORDER — OXYCODONE HCL 5 MG PO TABS
10.0000 mg | ORAL_TABLET | ORAL | Status: DC | PRN
  Administered 2022-12-30: 10 mg via ORAL
  Administered 2022-12-30 – 2022-12-31 (×4): 15 mg via ORAL
  Administered 2022-12-31: 10 mg via ORAL
  Administered 2022-12-31: 15 mg via ORAL
  Filled 2022-12-28 (×2): qty 3
  Filled 2022-12-28: qty 2
  Filled 2022-12-28 (×3): qty 3

## 2022-12-28 MED ORDER — ONDANSETRON HCL 4 MG/2ML IJ SOLN: 4.0000 mg | Freq: Once | INTRAMUSCULAR | Status: DC | PRN

## 2022-12-28 MED ORDER — 0.9 % SODIUM CHLORIDE (POUR BTL) OPTIME
TOPICAL | Status: DC | PRN
  Administered 2022-12-28: 1000 mL

## 2022-12-28 MED ORDER — DIPHENHYDRAMINE HCL 12.5 MG/5ML PO ELIX
12.5000 mg | ORAL_SOLUTION | ORAL | Status: DC | PRN
  Administered 2022-12-28 – 2022-12-30 (×2): 25 mg via ORAL
  Filled 2022-12-28 (×2): qty 10

## 2022-12-28 MED ORDER — RIVAROXABAN 10 MG PO TABS
10.0000 mg | ORAL_TABLET | Freq: Every day | ORAL | Status: DC
  Administered 2022-12-29 – 2022-12-31 (×3): 10 mg via ORAL
  Filled 2022-12-28 (×3): qty 1

## 2022-12-28 MED ORDER — BUPIVACAINE IN DEXTROSE 0.75-8.25 % IT SOLN
INTRATHECAL | Status: DC | PRN
  Administered 2022-12-28: 1.6 mL via INTRATHECAL

## 2022-12-28 MED ORDER — PROPOFOL 500 MG/50ML IV EMUL
INTRAVENOUS | Status: DC | PRN
  Administered 2022-12-28: 75 ug/kg/min via INTRAVENOUS

## 2022-12-28 MED ORDER — FLEET ENEMA 7-19 GM/118ML RE ENEM: 1.0000 | ENEMA | Freq: Once | RECTAL | Status: DC | PRN

## 2022-12-28 MED ORDER — ALBUTEROL SULFATE HFA 108 (90 BASE) MCG/ACT IN AERS: 2.0000 | INHALATION_SPRAY | RESPIRATORY_TRACT | Status: DC | PRN

## 2022-12-28 MED ORDER — CEFAZOLIN SODIUM-DEXTROSE 2-4 GM/100ML-% IV SOLN
2.0000 g | Freq: Four times a day (QID) | INTRAVENOUS | Status: AC
  Administered 2022-12-28 – 2022-12-29 (×2): 2 g via INTRAVENOUS
  Filled 2022-12-28 (×2): qty 100

## 2022-12-28 MED ORDER — FENTANYL CITRATE PF 50 MCG/ML IJ SOSY: 25.0000 ug | PREFILLED_SYRINGE | INTRAMUSCULAR | Status: DC | PRN

## 2022-12-28 MED ORDER — ORAL CARE MOUTH RINSE: 15.0000 mL | Freq: Once | OROMUCOSAL | Status: AC

## 2022-12-28 MED ORDER — ONDANSETRON HCL 4 MG/2ML IJ SOLN: 4.0000 mg | Freq: Four times a day (QID) | INTRAMUSCULAR | Status: DC | PRN

## 2022-12-28 MED ORDER — POLYETHYLENE GLYCOL 3350 17 G PO PACK: 17.0000 g | PACK | Freq: Every day | ORAL | Status: DC | PRN

## 2022-12-28 MED ORDER — SODIUM CHLORIDE (PF) 0.9 % IJ SOLN
INTRAMUSCULAR | Status: AC
  Filled 2022-12-28: qty 50

## 2022-12-28 MED ORDER — POVIDONE-IODINE 10 % EX SWAB
2.0000 | Freq: Once | CUTANEOUS | Status: AC
  Administered 2022-12-28: 2 via TOPICAL

## 2022-12-28 MED ORDER — AMISULPRIDE (ANTIEMETIC) 5 MG/2ML IV SOLN
10.0000 mg | Freq: Once | INTRAVENOUS | Status: AC
  Administered 2022-12-28: 10 mg via INTRAVENOUS

## 2022-12-28 MED ORDER — MIDAZOLAM HCL 2 MG/2ML IJ SOLN: 1.0000 mg | INTRAMUSCULAR | Status: DC

## 2022-12-28 MED ORDER — DEXAMETHASONE SODIUM PHOSPHATE 10 MG/ML IJ SOLN
8.0000 mg | Freq: Once | INTRAMUSCULAR | Status: AC
  Administered 2022-12-28: 8 mg via INTRAVENOUS

## 2022-12-28 MED ORDER — BUPIVACAINE LIPOSOME 1.3 % IJ SUSP
INTRAMUSCULAR | Status: AC
  Filled 2022-12-28: qty 20

## 2022-12-28 MED ORDER — SODIUM CHLORIDE 0.9 % IV SOLN: INTRAVENOUS | Status: DC

## 2022-12-28 MED ORDER — BISACODYL 10 MG RE SUPP: 10.0000 mg | Freq: Every day | RECTAL | Status: DC | PRN

## 2022-12-28 MED ORDER — SODIUM CHLORIDE 0.9 % IR SOLN
Status: DC | PRN
  Administered 2022-12-28: 3000 mL

## 2022-12-28 MED ORDER — HYDROMORPHONE HCL 1 MG/ML IJ SOLN
0.5000 mg | INTRAMUSCULAR | Status: DC | PRN
  Filled 2022-12-28: qty 1

## 2022-12-28 MED ORDER — METOCLOPRAMIDE HCL 5 MG/ML IJ SOLN: 5.0000 mg | Freq: Three times a day (TID) | INTRAMUSCULAR | Status: DC | PRN

## 2022-12-28 SURGICAL SUPPLY — 72 items
ADH SKN CLS APL DERMABOND .7 (GAUZE/BANDAGES/DRESSINGS) ×1
AUG TIB .75 10 REV CMNT LM/RL (Joint) ×1 IMPLANT
AUG TIB .75 10 REV CMNT RM/LL (Joint) ×1 IMPLANT
AUG TIB ATTUNE LM/RL SZ3/4X10 (Joint) ×1 IMPLANT
AUG TIB ATTUNE RM/LL SZ3/4X10 (Joint) ×1 IMPLANT
AUGMENT TIB ATT LM/RL SZ3/4X10 (Joint) IMPLANT
AUGMENT TIB ATT RM/LL SZ3/4X10 (Joint) IMPLANT
BAG COUNTER SPONGE SURGICOUNT (BAG) IMPLANT
BAG SPEC THK2 15X12 ZIP CLS (MISCELLANEOUS) ×1
BAG SPNG CNTER NS LX DISP (BAG) ×1
BAG ZIPLOCK 12X15 (MISCELLANEOUS) IMPLANT
BLADE SAG 18X100X1.27 (BLADE) ×1 IMPLANT
BLADE SAW SGTL 11.0X1.19X90.0M (BLADE) ×1 IMPLANT
BLADE SURG SZ10 CARB STEEL (BLADE) IMPLANT
BNDG CMPR 5X62 HK CLSR LF (GAUZE/BANDAGES/DRESSINGS) ×1
BNDG CMPR 6 X 5 YARDS HK CLSR (GAUZE/BANDAGES/DRESSINGS) ×1
BNDG CMPR 6"X 5 YARDS HK CLSR (GAUZE/BANDAGES/DRESSINGS) ×1
BNDG CMPR MED 10X6 ELC LF (GAUZE/BANDAGES/DRESSINGS) ×1
BNDG ELASTIC 6INX 5YD STR LF (GAUZE/BANDAGES/DRESSINGS) ×1 IMPLANT
BNDG ELASTIC 6X10 VLCR STRL LF (GAUZE/BANDAGES/DRESSINGS) IMPLANT
BONE CEMENT GENTAMICIN (Cement) ×3 IMPLANT
BSPLAT TIB 3 CMNT REV ROT PLAT (Knees) ×1 IMPLANT
CEMENT BONE GENTAMICIN 40 (Cement) ×3 IMPLANT
COMP FEM ATTUNE CRS CEM RT SZ4 (Femur) ×1 IMPLANT
COMPONENT FEM ATN CR CEM RTSZ4 (Femur) IMPLANT
COVER SURGICAL LIGHT HANDLE (MISCELLANEOUS) ×1 IMPLANT
CUFF TOURN SGL QUICK 34 (TOURNIQUET CUFF) ×1
CUFF TRNQT CYL 34X4.125X (TOURNIQUET CUFF) ×1 IMPLANT
DERMABOND ADVANCED .7 DNX12 (GAUZE/BANDAGES/DRESSINGS) IMPLANT
DRAPE U-SHAPE 47X51 STRL (DRAPES) ×1 IMPLANT
DRSG AQUACEL AG ADV 3.5X10 (GAUZE/BANDAGES/DRESSINGS) ×1 IMPLANT
DURAPREP 26ML APPLICATOR (WOUND CARE) ×1 IMPLANT
ELECT REM PT RETURN 15FT ADLT (MISCELLANEOUS) ×1 IMPLANT
GLOVE BIO SURGEON STRL SZ 6.5 (GLOVE) IMPLANT
GLOVE BIO SURGEON STRL SZ7 (GLOVE) IMPLANT
GLOVE BIO SURGEON STRL SZ8 (GLOVE) ×2 IMPLANT
GLOVE BIOGEL PI IND STRL 7.0 (GLOVE) IMPLANT
GLOVE BIOGEL PI IND STRL 8 (GLOVE) ×1 IMPLANT
GOWN STRL REUS W/ TWL LRG LVL3 (GOWN DISPOSABLE) ×1 IMPLANT
GOWN STRL REUS W/TWL LRG LVL3 (GOWN DISPOSABLE) ×1
HANDPIECE INTERPULSE COAX TIP (DISPOSABLE) ×1
HOLDER FOLEY CATH W/STRAP (MISCELLANEOUS) IMPLANT
IMMOBILIZER KNEE 20 (SOFTGOODS) ×1
IMMOBILIZER KNEE 20 THIGH 36 (SOFTGOODS) ×1 IMPLANT
INSERT TIB CMT ATTUNE RP SZ3 (Knees) IMPLANT
INSERT TIB CRS ATTUNE SZ4 10 (Insert) IMPLANT
KIT TURNOVER KIT A (KITS) IMPLANT
MANIFOLD NEPTUNE II (INSTRUMENTS) ×1 IMPLANT
NS IRRIG 1000ML POUR BTL (IV SOLUTION) ×1 IMPLANT
PACK TOTAL KNEE CUSTOM (KITS) ×1 IMPLANT
PADDING CAST COTTON 6X4 STRL (CAST SUPPLIES) ×2 IMPLANT
PIN STEINMAN FIXATION KNEE (PIN) IMPLANT
PROTECTOR NERVE ULNAR (MISCELLANEOUS) ×1 IMPLANT
SET HNDPC FAN SPRY TIP SCT (DISPOSABLE) ×1 IMPLANT
SLEEVE KNEE ATTUNE 29MM (Knees) IMPLANT
SPIKE FLUID TRANSFER (MISCELLANEOUS) IMPLANT
STEM STR ATTUNE PF 14X110 (Knees) IMPLANT
STEM STR ATTUNE PF 14X60 (Knees) IMPLANT
STRIP CLOSURE SKIN 1/2X4 (GAUZE/BANDAGES/DRESSINGS) ×2 IMPLANT
SUT MNCRL AB 4-0 PS2 18 (SUTURE) ×1 IMPLANT
SUT STRATAFIX 0 PDS 27 VIOLET (SUTURE) ×1
SUT VIC AB 2-0 CT1 27 (SUTURE) ×3
SUT VIC AB 2-0 CT1 TAPERPNT 27 (SUTURE) ×3 IMPLANT
SUTURE STRATFX 0 PDS 27 VIOLET (SUTURE) ×1 IMPLANT
SWAB COLLECTION DEVICE MRSA (MISCELLANEOUS) IMPLANT
SWAB CULTURE ESWAB REG 1ML (MISCELLANEOUS) IMPLANT
TOWER CARTRIDGE SMART MIX (DISPOSABLE) ×1 IMPLANT
TRAY FOLEY MTR SLVR 16FR STAT (SET/KITS/TRAYS/PACK) ×1 IMPLANT
TUBE KAMVAC SUCTION (TUBING) IMPLANT
TUBE SUCTION HIGH CAP CLEAR NV (SUCTIONS) ×1 IMPLANT
WATER STERILE IRR 1000ML POUR (IV SOLUTION) ×1 IMPLANT
WRAP KNEE MAXI GEL POST OP (GAUZE/BANDAGES/DRESSINGS) IMPLANT

## 2022-12-28 NOTE — Interval H&P Note (Signed)
History and Physical Interval Note:  12/28/2022 10:52 AM  Rebecca [name]  has presented today for surgery, with the diagnosis of Failed right total knee arthroplasty.  The various methods of treatment have been discussed with the patient and family. After consideration of risks, benefits and other options for treatment, the patient has consented to  Procedure(s): Right knee femoral versus total knee arthroplasty revision (Right) as a surgical intervention.  The patient's history has been reviewed, patient examined, no change in status, stable for surgery.  I have reviewed the patient's chart and labs.  Questions were answered to the patient's satisfaction.     Homero Fellers Mikayah Joy

## 2022-12-28 NOTE — Anesthesia Postprocedure Evaluation (Signed)
Anesthesia Post Note  Patient: Rebecca [name]  Procedure(s) Performed: Right knee femoral versus total knee arthroplasty revision (Right: Knee)     Patient location during evaluation: PACU Anesthesia Type: Spinal Level of consciousness: awake and alert Pain management: pain level controlled Vital Signs Assessment: post-procedure vital signs reviewed and stable Respiratory status: spontaneous breathing and respiratory function stable Cardiovascular status: blood pressure returned to baseline and stable Postop Assessment: spinal receding and no apparent nausea or vomiting Anesthetic complications: no   No notable events documented.  Last Vitals:  Vitals:   12/28/22 1600 12/28/22 1615  BP: (!) 140/81 122/65  Pulse: 71 81  Resp: 19 20  Temp:  37 C  SpO2: 96% 94%    Last Pain:  Vitals:   12/28/22 1615  TempSrc:   PainSc: 0-No pain                 Beryle Lathe

## 2022-12-28 NOTE — Discharge Instructions (Addendum)
Rebecca Gross, MD Total Joint Specialist [facility] [address] [place], [place] [address] [phone]  TOTAL KNEE REVISION POSTOPERATIVE DIRECTIONS  Knee Rehabilitation, Guidelines Following Surgery  Results after knee surgery are often greatly improved when you follow the exercise, range of motion and muscle strengthening exercises prescribed by your doctor. Safety measures are also important to protect the knee from further injury. If any of these exercises cause you to have increased pain or swelling in your knee joint, decrease the amount until you are comfortable again and slowly increase them. If you have problems or questions, call your caregiver or physical therapist for advice.   BLOOD CLOT PREVENTION Take a 10 mg Xarelto once a day for three weeks following surgery. Then take an 81 mg Aspirin once a day for three weeks. Then discontinue Aspirin. You may resume your vitamins/supplements once you have discontinued the Xarelto. Do not take any NSAIDs (Advil, Aleve, Ibuprofen, Meloxicam, etc.) until you have discontinued the Xarelto.   Information on my medicine - XARELTO (Rivaroxaban)  Why was Xarelto prescribed for you? Xarelto was prescribed for you to reduce the risk of blood clots forming after orthopedic surgery. The medical term for these abnormal blood clots is venous thromboembolism (VTE).  What do you need to know about xarelto ? Take your Xarelto ONCE DAILY at the same time every day. You may take it either with or without food.  If you have difficulty swallowing the tablet whole, you may crush it and mix in applesauce just prior to taking your dose.  Take Xarelto exactly as prescribed by your doctor and DO NOT stop taking Xarelto without talking to the doctor who prescribed the medication.  Stopping without other VTE prevention medication to take the place of Xarelto may increase your risk of developing a clot.  After  discharge, you should have regular check-up appointments with your healthcare provider that is prescribing your Xarelto.    What do you do if you miss a dose? If you miss a dose, take it as soon as you remember on the same day then continue your regularly scheduled once daily regimen the next day. Do not take two doses of Xarelto on the same day.   Important Safety Information A possible side effect of Xarelto is bleeding. You should call your healthcare provider right away if you experience any of the following: Bleeding from an injury or your nose that does not stop. Unusual colored urine (red or dark brown) or unusual colored stools (red or black). Unusual bruising for unknown reasons. A serious fall or if you hit your head (even if there is no bleeding).  Some medicines may interact with Xarelto and might increase your risk of bleeding while on Xarelto. To help avoid this, consult your healthcare provider or pharmacist prior to using any new prescription or non-prescription medications, including herbals, vitamins, non-steroidal anti-inflammatory drugs (NSAIDs) and supplements.  This website has more information on Xarelto: VisitDestination.com.br.    HOME CARE INSTRUCTIONS  Remove items at home which could result in a fall. This includes throw rugs or furniture in walking pathways.  ICE to the affected knee as much as tolerated. Icing helps control swelling. If the swelling is well controlled you will be more comfortable and rehab easier. Continue to use ice on the knee for pain and swelling from surgery. You may notice swelling that will progress down to the foot and ankle. This is normal after surgery. Elevate the leg when you  are not up walking on it.    Continue to use the breathing machine which will help keep your temperature down. It is common for your temperature to cycle up and down following surgery, especially at night when you are not up moving around and exerting yourself. The  breathing machine keeps your lungs expanded and your temperature down. Do not place pillow under the operative knee, focus on keeping the knee straight while resting  DIET You may resume your previous home diet once you are discharged from the hospital.  DRESSING / WOUND CARE / SHOWERING Keep your bulky bandage on for 2 days. On the third post-operative day you may remove the Ace bandage and gauze. There is a waterproof adhesive bandage on your skin which will stay in place until your first follow-up appointment. Once you remove this you will not need to place another bandage You may begin showering 3 days following surgery, but do not submerge the incision under water.  ACTIVITY For the first 5 days, the key is rest and control of pain and swelling Do your home exercises twice a day starting on post-operative day 3. On the days you go to physical therapy, just do the home exercises once that day. You should rest, ice and elevate the leg for 50 minutes out of every hour. Get up and walk/stretch for 10 minutes per hour. After 5 days you can increase your activity slowly as tolerated. Walk with your walker as instructed. Use the walker until you are comfortable transitioning to a cane. Walk with the cane in the opposite hand of the operative leg. You may discontinue the cane once you are comfortable and walking steadily. Avoid periods of inactivity such as sitting longer than an hour when not asleep. This helps prevent blood clots.  You may discontinue the knee immobilizer once you are able to perform a straight leg raise while lying down. You may resume a sexual relationship in one month or when given the OK by your doctor.  You may return to work once you are cleared by your doctor.  Do not drive a car for 6 weeks or until released by your surgeon.  Do not drive while taking narcotics.  TED HOSE STOCKINGS Wear the elastic stockings on both legs for three weeks following surgery during the day.  You may remove them at night for sleeping.  WEIGHT BEARING Weight bearing as tolerated with assist device (walker, cane, etc) as directed, use it as long as suggested by your surgeon or therapist, typically at least 4-6 weeks.  POSTOPERATIVE CONSTIPATION PROTOCOL Constipation - defined medically as fewer than three stools per week and severe constipation as less than one stool per week.  One of the most common issues patients have following surgery is constipation.  Even if you have a regular bowel pattern at home, your normal regimen is likely to be disrupted due to multiple reasons following surgery.  Combination of anesthesia, postoperative narcotics, change in appetite and fluid intake all can affect your bowels.  In order to avoid complications following surgery, here are some recommendations in order to help you during your recovery period.  Colace (docusate) - Pick up an over-the-counter form of Colace or another stool softener and take twice a day as long as you are requiring postoperative pain medications.  Take with a full glass of water daily.  If you experience loose stools or diarrhea, hold the colace until you stool forms back up. If your symptoms do not get  better within 1 week or if they get worse, check with your doctor. Dulcolax (bisacodyl) - Pick up over-the-counter and take as directed by the product packaging as needed to assist with the movement of your bowels.  Take with a full glass of water.  Use this product as needed if not relieved by Colace only.  MiraLax (polyethylene glycol) - Pick up over-the-counter to have on hand. MiraLax is a solution that will increase the amount of water in your bowels to assist with bowel movements.  Take as directed and can mix with a glass of water, juice, soda, coffee, or tea. Take if you go more than two days without a movement. Do not use MiraLax more than once per day. Call your doctor if you are still constipated or irregular after using this  medication for 7 days in a row.  If you continue to have problems with postoperative constipation, please contact the office for further assistance and recommendations.  If you experience "the worst abdominal pain ever" or develop nausea or vomiting, please contact the office immediatly for further recommendations for treatment.  ITCHING If you experience itching with your medications, try taking only a single pain pill, or even half a pain pill at a time.  You can also use Benadryl over the counter for itching or also to help with sleep.   MEDICATIONS See your medication summary on the "After Visit Summary" that the nursing staff will review with you prior to discharge.  You may have some home medications which will be placed on hold until you complete the course of blood thinner medication.  It is important for you to complete the blood thinner medication as prescribed by your surgeon.  Continue your approved medications as instructed at time of discharge.  PRECAUTIONS If you experience chest pain or shortness of breath - call 911 immediately for transfer to the hospital emergency department.  If you develop a fever greater that 101 F, purulent drainage from wound, increased redness or drainage from wound, foul odor from the wound/dressing, or calf pain - CONTACT YOUR SURGEON.                                                   FOLLOW-UP APPOINTMENTS Make sure you keep all of your appointments after your operation with your surgeon and caregivers. You should call the office at the above phone number and make an appointment for approximately two weeks after the date of your surgery or on the date instructed by your surgeon outlined in the "After Visit Summary".  RANGE OF MOTION AND STRENGTHENING EXERCISES  Rehabilitation of the knee is important following a knee injury or an operation. After just a few days of immobilization, the muscles of the thigh which control the knee become weakened and shrink  (atrophy). Knee exercises are designed to build up the tone and strength of the thigh muscles and to improve knee motion. Often times heat used for twenty to thirty minutes before working out will loosen up your tissues and help with improving the range of motion but do not use heat for the first two weeks following surgery. These exercises can be done on a training (exercise) mat, on the floor, on a table or on a bed. Use what ever works the best and is most comfortable for you Knee exercises include:  Leg Lifts - While your knee is still immobilized in a splint or cast, you can do straight leg raises. Lift the leg to 60 degrees, hold for 3 sec, and slowly lower the leg. Repeat 10-20 times 2-3 times daily. Perform this exercise against resistance later as your knee gets better.  Quad and Hamstring Sets - Tighten up the muscle on the front of the thigh (Quad) and hold for 5-10 sec. Repeat this 10-20 times hourly. Hamstring sets are done by pushing the foot backward against an object and holding for 5-10 sec. Repeat as with quad sets.  Leg Slides: Lying on your back, slowly slide your foot toward your buttocks, bending your knee up off the floor (only go as far as is comfortable). Then slowly slide your foot back down until your leg is flat on the floor again. Angel Wings: Lying on your back spread your legs to the side as far apart as you can without causing discomfort.  A rehabilitation program following serious knee injuries can speed recovery and prevent re-injury in the future due to weakened muscles. Contact your doctor or a physical therapist for more information on knee rehabilitation.   POST-OPERATIVE OPIOID TAPER INSTRUCTIONS: It is important to wean off of your opioid medication as soon as possible. If you do not need pain medication after your surgery it is ok to stop day one. Opioids include: Codeine, Hydrocodone(Norco, Vicodin), Oxycodone(Percocet, oxycontin) and hydromorphone amongst others.   Long term and even short term use of opiods can cause: Increased pain response Dependence Constipation Depression Respiratory depression And more.  Withdrawal symptoms can include Flu like symptoms Nausea, vomiting And more Techniques to manage these symptoms Hydrate well Eat regular healthy meals Stay active Use relaxation techniques(deep breathing, meditating, yoga) Do Not substitute Alcohol to help with tapering If you have been on opioids for less than two weeks and do not have pain than it is ok to stop all together.  Plan to wean off of opioids This plan should start within one week post op of your joint replacement. Maintain the same interval or time between taking each dose and first decrease the dose.  Cut the total daily intake of opioids by one tablet each day Next start to increase the time between doses. The last dose that should be eliminated is the evening dose.   IF YOU ARE TRANSFERRED TO A SKILLED REHAB FACILITY If the patient is transferred to a skilled rehab facility following release from the hospital, a list of the current medications will be sent to the facility for the patient to continue.  When discharged from the skilled rehab facility, please have the facility set up the patient's Home Health Physical Therapy prior to being released. Also, the skilled facility will be responsible for providing the patient with their medications at time of release from the facility to include their pain medication, the muscle relaxants, and their blood thinner medication. If the patient is still at the rehab facility at time of the two week follow up appointment, the skilled rehab facility will also need to assist the patient in arranging follow up appointment in our office and any transportation needs.  MAKE SURE YOU:  Understand these instructions.  Get help right away if you are not doing well or get worse.   DENTAL ANTIBIOTICS:  In most cases prophylactic antibiotics  for Dental procdeures after total joint surgery are not necessary.  Exceptions are as follows:  1. History of prior total joint infection  2. Severely immunocompromised (Organ Transplant, cancer chemotherapy, Rheumatoid biologic meds such as Humera)  3. Poorly controlled diabetes (A1C &gt; 8.0, blood glucose over 200)  If you have one of these conditions, contact your surgeon for an antibiotic prescription, prior to your dental procedure.    Pick up stool softner and laxative for home use following surgery while on pain medications. Do not submerge incision under water. Please use good hand washing techniques while changing dressing each day. May shower starting three days after surgery. Please use a clean towel to pat the incision dry following showers. Continue to use ice for pain and swelling after surgery. Do not use any lotions or creams on the incision until instructed by your surgeon.

## 2022-12-28 NOTE — Anesthesia Procedure Notes (Addendum)
Spinal  Patient location during procedure: OR Start time: 12/28/2022 1:03 PM End time: 12/28/2022 1:07 PM Reason for block: surgical anesthesia Staffing Performed: anesthesiologist  Anesthesiologist: Beryle Lathe, MD Performed by: Beryle Lathe, MD Authorized by: Beryle Lathe, MD   Preanesthetic Checklist Completed: patient identified, IV checked, risks and benefits discussed, surgical consent, monitors and equipment checked, pre-op evaluation and timeout performed Spinal Block Patient position: sitting Prep: DuraPrep Patient monitoring: heart rate, cardiac monitor, continuous pulse ox and blood pressure Approach: midline Location: L2-3 Injection technique: single-shot Needle Needle type: Quincke  Needle gauge: 22 G Additional Notes Consent was obtained prior to the procedure with all questions answered and concerns addressed. Risks including, but not limited to, bleeding, infection, nerve damage, paralysis, failed block, inadequate analgesia, allergic reaction, high spinal, itching, and headache were discussed and the patient wished to proceed. Functioning IV was confirmed and monitors were applied. Sterile prep and drape, including hand hygiene, mask, and sterile gloves were used. The patient was positioned and the spine was prepped. The skin was anesthetized with lidocaine. Free flow of clear CSF was obtained prior to injecting local anesthetic into the CSF. The spinal needle aspirated freely following injection. The needle was carefully withdrawn. The patient tolerated the procedure well.   Leslye Peer, MD

## 2022-12-28 NOTE — Progress Notes (Signed)
Orthopedic Tech Progress Note Patient Details:  Rebecca [name] [birth year] [id number] CPM will be removed at 7:20 pm.  CPM Right Knee CPM Right Knee: On Right Knee Flexion (Degrees): 40 Right Knee Extension (Degrees): 10  Post Interventions Patient Tolerated: Well  Rebecca [name] 12/28/2022, 3:30 PM

## 2022-12-28 NOTE — Op Note (Signed)
NAMEMEHR, DEPAOLI MEDICAL RECORD NO: [id number] ACCOUNT NO: [id number] DATE OF BIRTH: 16-Apr-1956 FACILITY: [facility] LOCATION: [facility] PHYSICIAN: Gus Rankin. 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  PROCEDURE:  Right total knee arthroplasty revision.  SURGEON:  Gus Rankin. Osmany Azer, MD  ASSISTANT:  Weston Brass, PA-C.  ANESTHESIA:  Adductor canal block and spinal.  ESTIMATED BLOOD LOSS:  75 mL.  DRAINS:  None.  COMPLICATIONS:  None.  TOURNIQUET TIME:  66 minutes at 300 mmHg.  COMPLICATIONS:  None.  CONDITION:  Stable to recovery.  BRIEF CLINICAL NOTE:  The patient is a 67 year old female who had a total knee arthroplasty done a little over a year ago.  She has had persistent discomfort and instability symptoms.  Workup for infection was negative.  She had a bone scan, which showed  potential femoral loosening.  She has had extensive therapy and nonoperative management.  She presents now for femoral versus total knee arthroplasty revision.  DESCRIPTION OF PROCEDURE:  After successful administration of adductor canal block and spinal anesthetic, a tourniquet was placed on her right thigh.  Exam under anesthesia shows a pretty significant amount of varus, valgus laxity in extension as well as  AP laxity in 90 degrees of flexion.  This was for more laxity that encountered when she was awake and able to resist the stress testing.  The right lower extremity was then prepped and draped in the usual sterile fashion.  Extremity was wrapped in  Esmarch, tourniquet inflated to 300 mmHg.  Midline incision was made with a 10 blade through subcutaneous tissue to the level of the extensor mechanism.  Fresh blade was used to make a medial parapatellar arthrotomy.  There was some fluid in the  joint,  which was sent for Gram stain, culture, sensitivity and aerobic and anaerobic cultures.  Soft tissue over the proximal medial tibia subperiosteally elevated to the joint line with a knife and into the semimembranosus bursa with a Cobb elevator.  Soft  tissue laterally was elevated with attention being paid to avoid the patellar tendon on tibial tubercle.  I excised the scar from under the extensor mechanism and in the infrapatellar region.  Patella was everted, knee flexed to 90 degrees.  Tibial  polyethylene was removed.  Tibia subluxed forward and circumferential retraction is obtained.  Oscillating saw was used to interrupt the interface between the tibial component and bone and tibial component was removed with minimal to no bone loss.  The  rest of the cement stem removed from the tibial metaphysis.  The extramedullary tibial alignment guide was placed, referencing proximally at the medial aspect of the tibial tubercle and distally along the second metatarsal axis and tibial crest.  Blocks  pinned to remove about 4 mm from the previous resection level.  Resection was made with an oscillating saw.  The size 3 was most appropriate and a proximal tibia was prepared with the modular drill for the size 3.  We then reamed the canal up to a 14 mm,  which had excellent press-fit.  This was for a 14 x 60 press-fit stem.  Proximally, we then prepared with the broach for a 29 sleeve for rotational control.  Femur was then addressed.  Osteotome was used to disrupt the interface between the femoral component and bone and the component was removed with essentially  no bone loss.  Drill was used to create a starting hole in the distal femur and reaming was  performed up to 14 mm, which had an excellent press-fit.  This was for a 14 x 110 stem.  The canal had been thoroughly irrigated with saline prior to doing the reaming.  The 14 mm reamer was used to serve as our intramedullary alignment guide and the   distal femoral cutting block was placed off of this.  Approximately 2 mm were taken off the distal femur.  Size 4 Attune is the most appropriate femoral size and the cutting block was placed onto a 14 x 110 stem and rotation of the block is matching the  epicondylar axis and confirmed by creating a rectangular flexion gap at 90 degrees.  The anterior, posterior and chamfer cuts were made essentially no bone was taken.  The intercondylar block was placed and the intercondylar cut was then made for the  Attune revision stem.  We then placed the trials, which on the tibial side is a size 3 Attune revision tibia with a 14 x 60 stem and two 10 mm augments, 1 medial and 1 lateral as well as a 29 sleeve.  On the femoral side, size 4 Attune revision femur  with a 14 x 110 stem.  Those were all placed and 10 mm trial insert was placed.  With the 10 full extension was achieved with excellent varus, valgus, and anterior, posterior balance throughout full range of motion.  Patelloplasty was performed removing  the soft tissue over the patellar button and the patella tracks normally.  The components were then assembled on the back table.  The Exparel 20 mL mixed with 60 mL of saline was injected into the extensor mechanism, periosteum of the femur and posterior  capsule.  Once the components are fully assembled then the cement is mixed which is 3 batches of gentamicin impregnated cement.  On the tibial side, we just cemented at the cut bone surface so that the sleeve was porous coated and the stem press fit.   Again, it is a size 3 Attune tibial revision tray with a 14 x 60 stem and two 10 mm augments and a 29 porous coated sleeve.  On the femoral side, we cemented distally, it had a press-fit stem.  This is a size 4 Attune revision constrained femur with a 14  x 110 stem.  A 10 mm trial was placed.  Knee held in full extension.  All extruded cement removed.  Once the cement was fully hardened, then the permanent 10 mm  posterior stabilized rotating platform insert was placed into the tibial tray.  Wound was  copiously irrigated with saline solution.  There is great stability throughout full range of motion.  Arthrotomy was then closed with a running 0 Stratafix suture.  Tourniquet was released, total time of 66 minutes.  Flexion against gravity was 135  degrees.  Patella tracks normally.  Minor bleeding stopped with cautery and then subcutaneous was closed with interrupted 2-0 Vicryl and subcuticular running 4-0 Monocryl.  Incisions cleaned and dried and a bulky sterile dressing applied.  She was placed  into a knee immobilizer, awakened, and transported to recovery in stable condition.  Note that a surgical assistant was of medical necessity for this procedure to do it in a safe and expeditious manner.  Surgical assistant was necessary for retraction of vital ligaments and neurovascular structures and for proper positioning of the limb  for safe  removal of the old implant and safe and accurate placement of the new implant.   PUS D: 12/28/2022 2:52:31 pm T: 12/28/2022 3:49:00 pm  JOB: [id number]/ [id number]

## 2022-12-28 NOTE — Brief Op Note (Signed)
12/28/2022  2:44 PM  PATIENT:  Rebecca [name]  67 y.o. female  PRE-OPERATIVE DIAGNOSIS:  Failed right total knee arthroplasty  POST-OPERATIVE DIAGNOSIS:  Failed right total knee arthroplasty  PROCEDURE:  Procedure(s): Right knee femoral versus total knee arthroplasty revision (Right)  SURGEON:  Surgeons and Role:    Ollen Gross, MD - Primary  PHYSICIAN ASSISTANT:   ASSISTANTS: Weston Brass, PA-C   ANESTHESIA:   regional and spinal  EBL:  75 mL   BLOOD ADMINISTERED:none  DRAINS: none   LOCAL MEDICATIONS USED:  OTHER Exparel  COUNTS:  YES  TOURNIQUET:   Total Tourniquet Time Documented: Thigh (Right) - 66 minutes Total: Thigh (Right) - 66 minutes   DICTATION: .Other Dictation: Dictation Number [id number]  PLAN OF CARE: Admit to inpatient   PATIENT DISPOSITION:  PACU - hemodynamically stable.

## 2022-12-28 NOTE — Plan of Care (Signed)
  Problem: Education: Goal: Knowledge of the prescribed therapeutic regimen will improve Outcome: Progressing Goal: Individualized Educational Video(s) Outcome: Completed/Met   Problem: Activity: Goal: Ability to avoid complications of mobility impairment will improve Outcome: Progressing Goal: Range of joint motion will improve Outcome: Adequate for Discharge   Problem: Clinical Measurements: Goal: Postoperative complications will be avoided or minimized Outcome: Progressing   Problem: Pain Management: Goal: Pain level will decrease with appropriate interventions Outcome: Progressing   Problem: Skin Integrity: Goal: Will show signs of wound healing Outcome: Progressing   Problem: Education: Goal: Knowledge of the prescribed therapeutic regimen will improve Outcome: Progressing Goal: Individualized Educational Video(s) Outcome: Completed/Met   Problem: Activity: Goal: Ability to avoid complications of mobility impairment will improve Outcome: Progressing   Problem: Pain Management: Goal: Pain level will decrease with appropriate interventions Outcome: Progressing   Problem: Skin Integrity: Goal: Will show signs of wound healing Outcome: Progressing   Problem: Clinical Measurements: Goal: Ability to maintain clinical measurements within normal limits will improve Outcome: Progressing Goal: Diagnostic test results will improve Outcome: Progressing Goal: Respiratory complications will improve Outcome: Progressing Goal: Cardiovascular complication will be avoided Outcome: Progressing   Problem: Nutrition: Goal: Adequate nutrition will be maintained Outcome: Completed/Met   Problem: Coping: Goal: Level of anxiety will decrease Outcome: Progressing

## 2022-12-28 NOTE — Progress Notes (Signed)
Pt placed on CPAP auto titrate using her mask and tubing.  Tolerating well.

## 2022-12-28 NOTE — Anesthesia Procedure Notes (Addendum)
Anesthesia Regional Block: Adductor canal block   Pre-Anesthetic Checklist: , timeout performed,  Correct Patient, Correct Site, Correct Laterality,  Correct Procedure, Correct Position, site marked,  Risks and benefits discussed,  Surgical consent,  Pre-op evaluation,  At surgeon's request and post-op pain management  Laterality: Right  Prep: chloraprep       Needles:  Injection technique: Single-shot  Needle Type: Echogenic Needle     Needle Length: 10cm  Needle Gauge: 21     Additional Needles:   Narrative:  Start time: 12/28/2022 12:15 PM End time: 12/28/2022 12:18 PM Injection made incrementally with aspirations every 5 mL.  Performed by: Personally  Anesthesiologist: Beryle Lathe, MD  Additional Notes: No pain on injection. No increased resistance to injection. Injection made in 5cc increments. Good needle visualization. Patient tolerated the procedure well.

## 2022-12-28 NOTE — Anesthesia Preprocedure Evaluation (Addendum)
Anesthesia Evaluation  Patient identified by MRN, date of birth, ID band Patient awake    Reviewed: Allergy & Precautions, NPO status , Patient's Chart, lab work & pertinent test results  History of Anesthesia Complications Negative for: history of anesthetic complications  Airway Mallampati: III  TM Distance: >3 FB Neck ROM: Full    Dental  (+) Dental Advisory Given, Teeth Intact   Pulmonary asthma , sleep apnea and Continuous Positive Airway Pressure Ventilation , former smoker   Pulmonary exam normal        Cardiovascular hypertension, Pt. on medications Normal cardiovascular exam     Neuro/Psych  Headaches  Legal blindness Hearing loss   negative psych ROS   GI/Hepatic Neg liver ROS,GERD  Controlled,, S/p lapband    Endo/Other   Pre-DM Obesity   Renal/GU negative Renal ROS     Musculoskeletal  (+) Arthritis , Osteoarthritis,    Abdominal   Peds  Hematology negative hematology ROS (+)   Anesthesia Other Findings   Reproductive/Obstetrics  Breast cancer                              Anesthesia Physical Anesthesia Plan  ASA: 2  Anesthesia Plan: Spinal   Post-op Pain Management: Tylenol PO (pre-op)* and Regional block*   Induction:   PONV Risk Score and Plan: 2 and Treatment may vary due to age or medical condition and Propofol infusion  Airway Management Planned: Natural Airway and Simple Face Mask  Additional Equipment: None  Intra-op Plan:   Post-operative Plan:   Informed Consent: I have reviewed the patients History and Physical, chart, labs and discussed the procedure including the risks, benefits and alternatives for the proposed anesthesia with the patient or authorized representative who has indicated his/her understanding and acceptance.       Plan Discussed with: CRNA and Anesthesiologist  Anesthesia Plan Comments: (Labs reviewed, platelets  acceptable. Discussed risks and benefits of spinal, including spinal/epidural hematoma, infection, failed block, and PDPH. Patient expressed understanding and wished to proceed. )        Anesthesia Quick Evaluation

## 2022-12-28 NOTE — Transfer of Care (Signed)
Immediate Anesthesia Transfer of Care Note  Patient: Rebecca [name]  Procedure(s) Performed: Right knee femoral versus total knee arthroplasty revision (Right: Knee)  Patient Location: PACU  Anesthesia Type:MAC combined with regional for post-op pain  Level of Consciousness: awake and alert   Airway & Oxygen Therapy: Patient Spontanous Breathing  Post-op Assessment: Report given to RN and Post -op Vital signs reviewed and stable  Post vital signs: Reviewed and stable  Last Vitals:  Vitals Value Taken Time  BP 126/60 12/28/22 1512  Temp    Pulse 68 12/28/22 1514  Resp 22 12/28/22 1514  SpO2 94 % 12/28/22 1514  Vitals shown include unfiled device data.  Last Pain:  Vitals:   12/28/22 1058  TempSrc: Oral  PainSc:       Patients Stated Pain Goal: 0 (12/28/22 1033)  Complications: No notable events documented.

## 2022-12-29 ENCOUNTER — Encounter (HOSPITAL_COMMUNITY): Payer: Self-pay | Admitting: Orthopedic Surgery

## 2022-12-29 LAB — CBC
HCT: 32.7 % — ABNORMAL LOW (ref 36.0–46.0)
Hemoglobin: 10.7 g/dL — ABNORMAL LOW (ref 12.0–15.0)
MCH: 31.1 pg (ref 26.0–34.0)
MCHC: 32.7 g/dL (ref 30.0–36.0)
MCV: 95.1 fL (ref 80.0–100.0)
Platelets: 303 10*3/uL (ref 150–400)
RBC: 3.44 MIL/uL — ABNORMAL LOW (ref 3.87–5.11)
RDW: 13 % (ref 11.5–15.5)
WBC: 10.4 10*3/uL (ref 4.0–10.5)
nRBC: 0 % (ref 0.0–0.2)

## 2022-12-29 LAB — BASIC METABOLIC PANEL
Anion gap: 6 (ref 5–15)
Anion gap: 10 (ref 5–15)
BUN: 14 mg/dL (ref 8–23)
BUN: 17 mg/dL (ref 8–23)
CO2: 19 mmol/L — ABNORMAL LOW (ref 22–32)
CO2: 22 mmol/L (ref 22–32)
Calcium: 7.1 mg/dL — ABNORMAL LOW (ref 8.9–10.3)
Calcium: 8.9 mg/dL (ref 8.9–10.3)
Chloride: 113 mmol/L — ABNORMAL HIGH (ref 98–111)
Chloride: 108 mmol/L (ref 98–111)
Creatinine, Ser: 0.73 mg/dL (ref 0.44–1.00)
Creatinine, Ser: 0.91 mg/dL (ref 0.44–1.00)
GFR, Estimated: >60 mL/min (ref >60)
GFR, Estimated: >60 mL/min (ref >60)
Glucose, Bld: 125 mg/dL — ABNORMAL HIGH (ref 70–99)
Glucose, Bld: 144 mg/dL — ABNORMAL HIGH (ref 70–99)
Potassium: 2.9 mmol/L — ABNORMAL LOW (ref 3.5–5.1)
Potassium: 4.2 mmol/L (ref 3.5–5.1)
Sodium: 138 mmol/L (ref 135–145)
Sodium: 140 mmol/L (ref 135–145)

## 2022-12-29 MED ORDER — POTASSIUM CHLORIDE CRYS ER 20 MEQ PO TBCR
40.0000 meq | EXTENDED_RELEASE_TABLET | ORAL | Status: AC
  Administered 2022-12-29 (×3): 40 meq via ORAL
  Filled 2022-12-29 (×3): qty 2

## 2022-12-29 NOTE — Evaluation (Signed)
Physical Therapy Evaluation Patient Details Name: Rebecca [name] MRN: [id number] DOB: 1956-02-04 Today's Date: 12/29/2022  History of Present Illness  Pt is 67 yo female s/p R TKA revision on 12/28/22.  Pt with hx including but not limited to sleep apnea, obesity, gastric banding, arthritis, HTN, and HLD, R TKA 3/23.  Clinical Impression  Pt is s/p TKA revision resulting in the deficits listed below (see PT Problem List). At baseline, pt is independent at baseline.  Reports she has been struggling for a year with her knee but still independent without AD.  She does have necessary DME and home support.  Today, pt reports good pain control 2/10 and that knee feels better than after TKA.  She required min guard-min A for transfers and ambulated 65' with RW tolerating well.  Pt with good quad activation and ROM.  Pt expected to progress well.  Pt will benefit from acute skilled PT to increase their independence and safety with mobility to allow discharge.          Assistance Recommended at Discharge Intermittent Supervision/Assistance  If plan is discharge home, recommend the following:  Can travel by private vehicle  A little help with walking and/or transfers;A little help with bathing/dressing/bathroom;Assistance with cooking/housework;Help with stairs or ramp for entrance        Equipment Recommendations None recommended by PT  Recommendations for Other Services       Functional Status Assessment Patient has had a recent decline in their functional status and demonstrates the ability to make significant improvements in function in a reasonable and predictable amount of time.     Precautions / Restrictions Precautions Precautions: Fall;Knee Restrictions Weight Bearing Restrictions: Yes RLE Weight Bearing: Weight bearing as tolerated      Mobility  Bed Mobility Overal bed mobility: Needs Assistance Bed Mobility: Supine to Sit     Supine to sit: Min assist     General bed  mobility comments: Light min A for R LE    Transfers Overall transfer level: Needs assistance Equipment used: Rolling walker (2 wheels) Transfers: Sit to/from Stand Sit to Stand: Min guard           General transfer comment: From bed and toilet; cues for hand placement    Ambulation/Gait Ambulation/Gait assistance: Min guard Gait Distance (Feet): 50 Feet Assistive device: Rolling walker (2 wheels) Gait Pattern/deviations: Step-to pattern, Decreased weight shift to right Gait velocity: decreased     General Gait Details: Tolerating well, cues for RW proximity (too close) and pushing RW  Careers information officer     Tilt Bed    Modified Rankin (Stroke Patients Only)       Balance Overall balance assessment: Needs assistance Sitting-balance support: No upper extremity supported Sitting balance-Leahy Scale: Good     Standing balance support: Bilateral upper extremity supported, Reliant on assistive device for balance Standing balance-Leahy Scale: Poor Standing balance comment: steady with RW                             Pertinent Vitals/Pain Pain Assessment Pain Assessment: 0-10 Pain Score: 2  Pain Location: R knee Pain Descriptors / Indicators: Discomfort, Sore Pain Intervention(s): Limited activity within patient's tolerance, Monitored during session, Premedicated before session, Repositioned, Ice applied    Home Living Family/patient expects to be discharged to:: Private residence Living Arrangements: Spouse/significant other;Children Available Help at Discharge: Family;Available 24  hours/day Type of Home: House Home Access: Ramped entrance       Home Layout: Multi-level;Full bath on main level;Able to live on main level with bedroom/bathroom (but plans to stay in recliner on first floor) Home Equipment: Rolling Walker (2 wheels);BSC/3in1      Prior Function Prior Level of Function : Independent/Modified Independent              Mobility Comments: Was having knee pain but still ambulating in community ADLs Comments: independent with adls and iadls     Hand Dominance        Extremity/Trunk Assessment   Upper Extremity Assessment Upper Extremity Assessment: Overall WFL for tasks assessed    Lower Extremity Assessment Lower Extremity Assessment: RLE deficits/detail;LLE deficits/detail RLE Deficits / Details: Expected post op changes; ROM: knee ~5 to 70 degrees; MMT: ankle 5/5, knee and hip 3/5 not further tested; pt reports feels much better than initial R TKA LLE Deficits / Details: ROM WFL; MMT 5/5    Cervical / Trunk Assessment Cervical / Trunk Assessment: Normal  Communication   Communication: No difficulties  Cognition Arousal/Alertness: Awake/alert Behavior During Therapy: WFL for tasks assessed/performed Overall Cognitive Status: Within Functional Limits for tasks assessed                                 General Comments: pleasant and motivated to improve        General Comments General comments (skin integrity, edema, etc.): O2 sats 93% ambulation and 99% rest on RA    Exercises Total Joint Exercises Ankle Circles/Pumps: AROM, Both, 10 reps, Supine Quad Sets: AROM, Both, 10 reps, Supine Long Arc Quad: AAROM, Right, 5 reps, Seated Knee Flexion: AAROM, Right, 5 reps, Seated Goniometric ROM: R knee~ 5 to 70 degrees   Assessment/Plan    PT Assessment Patient needs continued PT services  PT Problem List Decreased strength;Decreased mobility;Decreased range of motion;Decreased activity tolerance;Decreased balance;Decreased knowledge of use of DME;Pain       PT Treatment Interventions DME instruction;Therapeutic activities;Modalities;Gait training;Therapeutic exercise;Patient/family education;Stair training;Functional mobility training;Balance training    PT Goals (Current goals can be found in the Care Plan section)  Acute Rehab PT Goals Patient Stated  Goal: return home PT Goal Formulation: With patient Time For Goal Achievement: 01/12/23 Potential to Achieve Goals: Good    Frequency 7X/week     Co-evaluation               AM-PAC PT "6 Clicks" Mobility  Outcome Measure Help needed turning from your back to your side while in a flat bed without using bedrails?: A Little Help needed moving from lying on your back to sitting on the side of a flat bed without using bedrails?: A Little Help needed moving to and from a bed to a chair (including a wheelchair)?: A Little Help needed standing up from a chair using your arms (e.g., wheelchair or bedside chair)?: A Little Help needed to walk in hospital room?: A Little Help needed climbing 3-5 steps with a railing? : A Lot 6 Click Score: 17    End of Session Equipment Utilized During Treatment: Gait belt Activity Tolerance: Patient tolerated treatment well Patient left: in chair;with call bell/phone within reach;with chair alarm set Nurse Communication: Mobility status PT Visit Diagnosis: Other abnormalities of gait and mobility (R26.89);Muscle weakness (generalized) (M62.81)    Time: 8413-2440 PT Time Calculation (min) (ACUTE ONLY): 20 min   Charges:  PT Evaluation $PT Eval Low Complexity: 1 Low   PT General Charges $$ ACUTE PT VISIT: 1 Visit         Anise Salvo, PT Acute Rehab [facility] [phone]   Rayetta Humphrey 12/29/2022, 12:04 PM

## 2022-12-29 NOTE — Progress Notes (Signed)
   Subjective: 1 Day Post-Op Procedure(s) (LRB): Right knee femoral versus total knee arthroplasty revision (Right) Patient reports pain as mild.   Patient seen in rounds by Dr. Lequita Halt. Patient is well, and has had no acute complaints or problems. No issues overnight. Denies chest pain or SOB. Foley catheter to be removed this AM.  We will begin therapy today.  Objective: Vital signs in last 24 hours: Temp:  [97.5 F (36.4 C)-99.7 F (37.6 C)] 98 F (36.7 C) (07/16 0629) Pulse Rate:  [69-88] 70 (07/16 0629) Resp:  [12-21] 17 (07/16 0629) BP: (121-167)/(60-94) 122/66 (07/16 0629) SpO2:  [92 %-100 %] 92 % (07/16 0629) Weight:  [88.9 kg] 88.9 kg (07/15 1956)  Intake/Output from previous day:  Intake/Output Summary (Last 24 hours) at 12/29/2022 0848 Last data filed at 12/29/2022 0629 Gross per 24 hour  Intake 2331.05 ml  Output 1750 ml  Net 581.05 ml     Intake/Output this shift: No intake/output data recorded.  Labs: Recent Labs    12/29/22 0332  HGB 10.7*   Recent Labs    12/29/22 0332  WBC 10.4  RBC 3.44*  HCT 32.7*  PLT 303   Recent Labs    12/29/22 0332  NA 138  K 2.9*  CL 113*  CO2 19*  BUN 14  CREATININE 0.73  GLUCOSE 125*  CALCIUM 7.1*   No results for input(s): "LABPT", "INR" in the last 72 hours.  Exam: General - Patient is Alert and Oriented Extremity - Neurologically intact Neurovascular intact Sensation intact distally Dorsiflexion/Plantar flexion intact Dressing - dressing C/D/I Motor Function - intact, moving foot and toes well on exam.   Past Medical History:  Diagnosis Date   Arthritis    Asthma    Blindness, legal    L EYE   Breast cancer (HCC)    GERD (gastroesophageal reflux disease)    Hearing loss    Hyperlipidemia    Hypertension    Lapband May 2013 11/18/2011   Migraines    Obesity, Class III, BMI 40-49.9 (morbid obesity) (HCC) 06/03/2011   Pre-diabetes    Sleep apnea    uses c-pap   Vertigo      Assessment/Plan: 1 Day Post-Op Procedure(s) (LRB): Right knee femoral versus total knee arthroplasty revision (Right) Principal Problem:   Failed total knee arthroplasty (HCC) Active Problems:   Failed total right knee replacement (HCC)  Estimated body mass index is 34.72 kg/m as calculated from the following:   Height as of this encounter: 5\' 3"  (1.6 m).   Weight as of this encounter: 88.9 kg. Up with therapy  DVT Prophylaxis - Xarelto Weight bearing as tolerated. Begin therapy.  K 2.9 this AM. 3 doses of Kcl ordered and will repeat BMET this afternoon.  Plan is to go Home after hospital stay. Plan for discharge tomorrow pending therapy progression.  Arther Abbott, PA-C Orthopedic Surgery [phone] 12/29/2022, 8:48 AM

## 2022-12-29 NOTE — Progress Notes (Signed)
Orthopedic Tech Progress Note Patient Details:  [name] 1956/03/24 [id number]  Patient ID: Rebecca [name], female   DOB: January 19, 1956, 67 y.o.   MRN: [id number]  [name] 12/29/2022, 3:53 PM Cpm applied

## 2022-12-29 NOTE — Progress Notes (Signed)
Orthopedic Tech Progress Note Patient Details:  Rebecca [name] [birth year] [id number]  Patient ID: [name], female   DOB: 1956/02/14, 67 y.o.   MRN: [id number]  [name] 12/29/2022, 5:34 PM Cpm removed

## 2022-12-29 NOTE — Progress Notes (Signed)
Physical Therapy Treatment Patient Details Name: Rebecca [name] MRN: [id number] DOB: 16-Mar-1956 Today's Date: 12/29/2022   History of Present Illness Pt is 67 yo female s/p R TKA revision on 12/28/22.  Pt with hx including but not limited to sleep apnea, obesity, gastric banding, arthritis, HTN, and HLD, R TKA 3/23.    PT Comments  Pt continues to make good progress. She ambulated 92' with RW and min guard.  Pt did have 2 instances of mild buckling but able to compensate herself and improved with cues for step to gait.  She still reports good pain control and pleased with progress.  Will continue to progress while hospitalized.     Assistance Recommended at Discharge Intermittent Supervision/Assistance  If plan is discharge home, recommend the following:  Can travel by private vehicle    A little help with walking and/or transfers;A little help with bathing/dressing/bathroom;Assistance with cooking/housework;Help with stairs or ramp for entrance      Equipment Recommendations  None recommended by PT    Recommendations for Other Services       Precautions / Restrictions Precautions Precautions: Fall;Knee Restrictions Weight Bearing Restrictions: Yes RLE Weight Bearing: Weight bearing as tolerated     Mobility  Bed Mobility Overal bed mobility: Needs Assistance Bed Mobility: Sit to Supine     Supine to sit: Min assist Sit to supine: Min assist   General bed mobility comments: Light min A for R LE    Transfers Overall transfer level: Needs assistance Equipment used: Rolling walker (2 wheels) Transfers: Sit to/from Stand Sit to Stand: Min guard           General transfer comment: From chair and toilet; cues for hand placement    Ambulation/Gait Ambulation/Gait assistance: Min guard Gait Distance (Feet): 60 Feet Assistive device: Rolling walker (2 wheels) Gait Pattern/deviations: Step-to pattern, Decreased weight shift to right Gait velocity: decreased      General Gait Details: Tolerating well, cues for RW proximity (pt too close) and pushing RW. 2 episodes of mild buckling but pt recovering - cued for smaller steps on left/less weight shift to right   Stairs             Wheelchair Mobility     Tilt Bed    Modified Rankin (Stroke Patients Only)       Balance Overall balance assessment: Needs assistance Sitting-balance support: No upper extremity supported Sitting balance-Leahy Scale: Good     Standing balance support: Bilateral upper extremity supported, No upper extremity supported Standing balance-Leahy Scale: Fair Standing balance comment: Steady with RW ; able to do toielting adls and wash hands without support                            Cognition Arousal/Alertness: Awake/alert Behavior During Therapy: WFL for tasks assessed/performed Overall Cognitive Status: Within Functional Limits for tasks assessed                                 General Comments: pleasant and motivated to improve        Exercises Total Joint Exercises Ankle Circles/Pumps: AROM, Both, 10 reps, Supine Quad Sets: AROM, Both, 10 reps, Supine Hip ABduction/ADduction: AAROM, Right, 10 reps, Supine Long Arc Quad: AAROM, Right, 5 reps, Seated Knee Flexion: AAROM, Right, 5 reps, Seated Goniometric ROM: R knee~ 5 to 70 degrees    General Comments General comments (skin  integrity, edema, etc.): VSS      Pertinent Vitals/Pain Pain Assessment Pain Assessment: 0-10 Pain Score: 4  Pain Location: R knee with activity Pain Descriptors / Indicators: Discomfort, Sore Pain Intervention(s): Limited activity within patient's tolerance, Monitored during session, Repositioned, Ice applied, RN gave pain meds during session    Home Living Family/patient expects to be discharged to:: Private residence Living Arrangements: Spouse/significant other;Children Available Help at Discharge: Family;Available 24 hours/day Type of  Home: House Home Access: Ramped entrance       Home Layout: Multi-level;Full bath on main level;Able to live on main level with bedroom/bathroom (but plans to stay in recliner on first floor) Home Equipment: Rolling Walker (2 wheels);BSC/3in1      Prior Function            PT Goals (current goals can now be found in the care plan section) Acute Rehab PT Goals Patient Stated Goal: return home PT Goal Formulation: With patient Time For Goal Achievement: 01/12/23 Potential to Achieve Goals: Good Progress towards PT goals: Progressing toward goals    Frequency    7X/week      PT Plan Current plan remains appropriate    Co-evaluation              AM-PAC PT "6 Clicks" Mobility   Outcome Measure  Help needed turning from your back to your side while in a flat bed without using bedrails?: A Little Help needed moving from lying on your back to sitting on the side of a flat bed without using bedrails?: A Little Help needed moving to and from a bed to a chair (including a wheelchair)?: A Little Help needed standing up from a chair using your arms (e.g., wheelchair or bedside chair)?: A Little Help needed to walk in hospital room?: A Little Help needed climbing 3-5 steps with a railing? : A Little 6 Click Score: 18    End of Session Equipment Utilized During Treatment: Gait belt Activity Tolerance: Patient tolerated treatment well Patient left: with call bell/phone within reach;in bed;with bed alarm set;with SCD's reapplied Nurse Communication: Mobility status PT Visit Diagnosis: Other abnormalities of gait and mobility (R26.89);Muscle weakness (generalized) (M62.81)     Time: 4782-9562 PT Time Calculation (min) (ACUTE ONLY): 23 min  Charges:    $Gait Training: 8-22 mins $Therapeutic Exercise: 8-22 mins PT General Charges $$ ACUTE PT VISIT: 1 Visit                     Anise Salvo, PT Acute Rehab Services [facility] [phone]    Rayetta Humphrey 12/29/2022, 3:01 PM

## 2022-12-29 NOTE — TOC Transition Note (Signed)
Transition of Care [facility]) - CM/SW Discharge Note  Patient Details  Name: Rebecca [name] MRN: [id number] Date of Birth: [birth year]  Transition [facility] CM/SW Contact:  Ewing Schlein, LCSW Phone Number: 12/29/2022, 10:10 AM  Clinical Narrative: Patient is expected to discharge home after working with PT. CSW met with patient to confirm discharge plan. Patient will go home with OPPT at [facility]. Patient has a rolling walker and raised toilet seat at home, so there are no DME needs at this time. TOC signing off.    Final next level of care: OP Rehab Barriers to Discharge: No Barriers Identified  Patient Goals and CMS Choice Choice offered to / list presented to : NA  Discharge Plan and Services Additional resources added to the After Visit Summary for          DME Arranged: N/A DME Agency: NA  Social Determinants of Health (SDOH) Interventions SDOH Screenings   Food Insecurity: No Food Insecurity (12/28/2022)  Housing: Low Risk  (12/28/2022)  Transportation Needs: No Transportation Needs (12/28/2022)  Utilities: Not At Risk (12/28/2022)  Depression (PHQ2-9): Medium Risk (10/13/2022)  Financial Resource Strain: Low Risk  (04/01/2022)  Physical Activity: Inactive (10/06/2021)  Social Connections: Unknown (10/24/2021)   Received from [facility]  Stress: No Stress Concern Present (10/06/2021)  Tobacco Use: Medium Risk (12/28/2022)   Readmission Risk Interventions     No data to display

## 2022-12-30 ENCOUNTER — Other Ambulatory Visit (HOSPITAL_COMMUNITY): Payer: Self-pay

## 2022-12-30 LAB — CBC
HCT: 33.9 % — ABNORMAL LOW (ref 36.0–46.0)
Hemoglobin: 11 g/dL — ABNORMAL LOW (ref 12.0–15.0)
MCH: 31.1 pg (ref 26.0–34.0)
MCHC: 32.4 g/dL (ref 30.0–36.0)
MCV: 95.8 fL (ref 80.0–100.0)
Platelets: 309 10*3/uL (ref 150–400)
RBC: 3.54 MIL/uL — ABNORMAL LOW (ref 3.87–5.11)
RDW: 13.1 % (ref 11.5–15.5)
WBC: 13 10*3/uL — ABNORMAL HIGH (ref 4.0–10.5)
nRBC: 0 % (ref 0.0–0.2)

## 2022-12-30 MED ORDER — METHOCARBAMOL 500 MG PO TABS
500.0000 mg | ORAL_TABLET | Freq: Four times a day (QID) | ORAL | 0 refills | Status: DC | PRN
  Filled 2022-12-30: qty 40, 10d supply, fill #0

## 2022-12-30 MED ORDER — TRAMADOL HCL 50 MG PO TABS
50.0000 mg | ORAL_TABLET | Freq: Four times a day (QID) | ORAL | 0 refills | Status: DC | PRN
  Filled 2022-12-30: qty 40, 5d supply, fill #0

## 2022-12-30 MED ORDER — OXYCODONE HCL 5 MG PO TABS
5.0000 mg | ORAL_TABLET | Freq: Four times a day (QID) | ORAL | 0 refills | Status: DC | PRN
Start: 2022-12-30 — End: 2023-04-21
  Filled 2022-12-30: qty 42, 6d supply, fill #0

## 2022-12-30 MED ORDER — RIVAROXABAN 10 MG PO TABS
10.0000 mg | ORAL_TABLET | Freq: Every day | ORAL | 0 refills | Status: DC
  Filled 2022-12-30: qty 20, 20d supply, fill #0

## 2022-12-30 MED ORDER — ONDANSETRON HCL 4 MG PO TABS
4.0000 mg | ORAL_TABLET | Freq: Four times a day (QID) | ORAL | 0 refills | Status: DC | PRN
  Filled 2022-12-30: qty 20, 5d supply, fill #0

## 2022-12-30 NOTE — Progress Notes (Signed)
   Subjective: 2 Days Post-Op Procedure(s) (LRB): Right knee femoral versus total knee arthroplasty revision (Right) Patient reports pain as mild.   Patient seen in rounds for Dr. Lequita Halt. Patient had issues with increased pain last night, but is feeling better this morning. No acute events. Voiding without difficulty. Plan is to go Home after hospital stay.  Objective: Vital signs in last 24 hours: Temp:  [97.8 F (36.6 C)-98.1 F (36.7 C)] 97.8 F (36.6 C) (07/17 0535) Pulse Rate:  [76-88] 76 (07/17 0535) Resp:  [17-18] 17 (07/17 0535) BP: (133-151)/(55-74) 145/74 (07/17 0535) SpO2:  [93 %-97 %] 93 % (07/17 0535) FiO2 (%):  [21 %] 21 % (07/16 2300)  Intake/Output from previous day:  Intake/Output Summary (Last 24 hours) at 12/30/2022 0820 Last data filed at 12/30/2022 0752 Gross per 24 hour  Intake 630.55 ml  Output 1825 ml  Net -1194.45 ml    Intake/Output this shift: Total I/O In: -  Out: 200 [Urine:200]  Labs: Recent Labs    12/29/22 0332 12/30/22 0336  HGB 10.7* 11.0*   Recent Labs    12/29/22 0332 12/30/22 0336  WBC 10.4 13.0*  RBC 3.44* 3.54*  HCT 32.7* 33.9*  PLT 303 309   Recent Labs    12/29/22 0332 12/29/22 1406  NA 138 140  K 2.9* 4.2  CL 113* 108  CO2 19* 22  BUN 14 17  CREATININE 0.73 0.91  GLUCOSE 125* 144*  CALCIUM 7.1* 8.9   No results for input(s): "LABPT", "INR" in the last 72 hours.  Exam: General - Patient is Alert and Oriented Extremity - Neurologically intact Neurovascular intact Sensation intact distally Dorsiflexion/Plantar flexion intact Dressing/Incision - clean, dry, no drainage Motor Function - intact, moving foot and toes well on exam.   Past Medical History:  Diagnosis Date   Arthritis    Asthma    Blindness, legal    L EYE   Breast cancer (HCC)    GERD (gastroesophageal reflux disease)    Hearing loss    Hyperlipidemia    Hypertension    Lapband May 2013 11/18/2011   Migraines    Obesity, Class III,  BMI 40-49.9 (morbid obesity) (HCC) 06/03/2011   Pre-diabetes    Sleep apnea    uses c-pap   Vertigo     Assessment/Plan: 2 Days Post-Op Procedure(s) (LRB): Right knee femoral versus total knee arthroplasty revision (Right) Principal Problem:   Failed total knee arthroplasty (HCC) Active Problems:   Failed total right knee replacement (HCC)  Estimated body mass index is 34.72 kg/m as calculated from the following:   Height as of this encounter: 5\' 3"  (1.6 m).   Weight as of this encounter: 88.9 kg. Up with therapy  DVT Prophylaxis - Xarelto Weight-bearing as tolerated  Plan for discharge to home today once cleared with therapy Scheduled for OPPT Follow-up in 2 weeks in our office  The PDMP database was reviewed today prior to any opioid medications being prescribed to this patient.   Arther Abbott, PA-C Orthopedic Surgery [phone] 12/30/2022, 8:20 AM

## 2022-12-30 NOTE — Progress Notes (Signed)
Physical Therapy Treatment Patient Details Name: ELORA WOLTER MRN: [id number] DOB: Nov 05, 1955 Today's Date: 12/30/2022   History of Present Illness Pt is 67 yo female s/p R TKA revision on 12/28/22.  Pt with hx including but not limited to sleep apnea, obesity, gastric banding, arthritis, HTN, and HLD, R TKA 3/23.    PT Comments  Pt received in chair, call bell on to use  bathroom. Pt without pain meds since previous session. Poor tolerance for mobility with c/o 10/10 Right knee pain with transfers and gait training in room. Pt initially hoped to d/c home this pm, however pain management is a concern. Will plan to see pt in am to reassess mobility tolerance for safe d/c home. Nursing notified.     Assistance Recommended at Discharge Intermittent Supervision/Assistance  If plan is discharge home, recommend the following:  Can travel by private vehicle    A little help with walking and/or transfers;A little help with bathing/dressing/bathroom;Assistance with cooking/housework;Help with stairs or ramp for entrance      Equipment Recommendations  None recommended by PT    Recommendations for Other Services       Precautions / Restrictions Precautions Precautions: Fall;Knee Restrictions Weight Bearing Restrictions: Yes RLE Weight Bearing: Weight bearing as tolerated     Mobility  Bed Mobility Overal bed mobility: Needs Assistance Bed Mobility: Sit to Supine     Supine to sit: Min assist Sit to supine: Min assist (For R LE)   General bed mobility comments: Light min A for R LE    Transfers Overall transfer level: Needs assistance Equipment used: Rolling walker (2 wheels) Transfers: Sit to/from Stand Sit to Stand: Min guard           General transfer comment: From chair and toilet; cues for hand placement    Ambulation/Gait Ambulation/Gait assistance: Min guard Gait Distance (Feet):  (30) Assistive device: Rolling walker (2 wheels) Gait Pattern/deviations:  Step-through pattern, Antalgic Gait velocity: decreased     General Gait Details:  (Poor tolerance for PT this pm due to poor pain control)   Stairs             Wheelchair Mobility     Tilt Bed    Modified Rankin (Stroke Patients Only)       Balance Overall balance assessment: Needs assistance Sitting-balance support: No upper extremity supported Sitting balance-Leahy Scale: Good     Standing balance support: Bilateral upper extremity supported, No upper extremity supported Standing balance-Leahy Scale: Fair                              Cognition Arousal/Alertness: Awake/alert Behavior During Therapy: WFL for tasks assessed/performed Overall Cognitive Status: Within Functional Limits for tasks assessed                                 General Comments: pleasant and motivated to improve        Exercises Total Joint Exercises Ankle Circles/Pumps: AROM, Both, 10 reps, Supine Quad Sets: AROM, Both, 10 reps, Supine Long Arc Quad: AAROM, Right, 5 reps, Seated Knee Flexion: AAROM, Right, 5 reps, Seated Goniometric ROM: R knee in sitting -5 to 85    General Comments General comments (skin integrity, edema, etc.): Pt educated in relaxation techniques to reduce pain and maintain focus on task      Pertinent Vitals/Pain Pain Assessment Pain Assessment: 0-10 Pain  Score: 10-Worst pain ever Pain Location: R knee with activity Pain Descriptors / Indicators: Discomfort, Grimacing, Moaning, Operative site guarding Pain Intervention(s): Limited activity within patient's tolerance, Ice applied    Home Living                          Prior Function            PT Goals (current goals can now be found in the care plan section) Acute Rehab PT Goals Patient Stated Goal: return home    Frequency    7X/week      PT Plan Current plan remains appropriate    Co-evaluation              AM-PAC PT "6 Clicks" Mobility    Outcome Measure  Help needed turning from your back to your side while in a flat bed without using bedrails?: A Little Help needed moving from lying on your back to sitting on the side of a flat bed without using bedrails?: A Little Help needed moving to and from a bed to a chair (including a wheelchair)?: A Little Help needed standing up from a chair using your arms (e.g., wheelchair or bedside chair)?: A Little Help needed to walk in hospital room?: A Little Help needed climbing 3-5 steps with a railing? : A Little 6 Click Score: 18    End of Session Equipment Utilized During Treatment: Gait belt Activity Tolerance: Patient limited by pain Patient left: in bed;with call bell/phone within reach;with bed alarm set;with nursing/sitter in room;with SCD's reapplied Nurse Communication: Mobility status;Patient requests pain meds PT Visit Diagnosis: Other abnormalities of gait and mobility (R26.89);Muscle weakness (generalized) (M62.81)     Time: 2951-8841 PT Time Calculation (min) (ACUTE ONLY): 27 min  Charges:    $Gait Training: 8-22 mins $Therapeutic Activity: 8-22 mins PT General Charges $$ ACUTE PT VISIT: 1 Visit                    Zadie Cleverly, PTA  Jannet Askew 12/30/2022, 5:55 PM

## 2022-12-30 NOTE — Progress Notes (Signed)
Physical Therapy Treatment Patient Details Name: Rebecca [name] MRN: [id number] DOB: 08/27/1955 Today's Date: 12/30/2022   History of Present Illness Pt is 67 yo female s/p R TKA revision on 12/28/22.  Pt with hx including but not limited to sleep apnea, obesity, gastric banding, arthritis, HTN, and HLD, R TKA 3/23.    PT Comments  Good progress with increasing gait distance to 120' with RW and CGA. No buckling noted, 6/10 R knee pain after being premedicated. Pt continues to require light assistance for supine<>sit, however her husband will be home to help. Pt has a ramp to enter with bedroom on first floor. Will plan to see pt for pm session. Pt also states she feels safe returning home later today.    Assistance Recommended at Discharge Intermittent Supervision/Assistance  If plan is discharge home, recommend the following:  Can travel by private vehicle    A little help with walking and/or transfers;A little help with bathing/dressing/bathroom;Assistance with cooking/housework;Help with stairs or ramp for entrance      Equipment Recommendations  None recommended by PT    Recommendations for Other Services       Precautions / Restrictions Precautions Precautions: Fall;Knee Restrictions Weight Bearing Restrictions: Yes RLE Weight Bearing: Weight bearing as tolerated Other Position/Activity Restrictions:  (pt educated on keeping knee portion of bed flat)     Mobility  Bed Mobility Overal bed mobility: Needs Assistance Bed Mobility: Supine to Sit     Supine to sit: Min assist Sit to supine: Min assist   General bed mobility comments: Light min A for R LE    Transfers Overall transfer level: Needs assistance Equipment used: Rolling walker (2 wheels) Transfers: Sit to/from Stand Sit to Stand: Min guard           General transfer comment:  (Cues given for technique during transfers)    Ambulation/Gait Ambulation/Gait assistance: Min guard Gait Distance  (Feet):  (120) Assistive device: Rolling walker (2 wheels) Gait Pattern/deviations: Step-through pattern, Antalgic Gait velocity: decreased     General Gait Details:  (No episodes of R knee buckling this session.)   Stairs             Wheelchair Mobility     Tilt Bed    Modified Rankin (Stroke Patients Only)       Balance Overall balance assessment: Needs assistance Sitting-balance support: No upper extremity supported Sitting balance-Leahy Scale: Good     Standing balance support: Bilateral upper extremity supported, No upper extremity supported Standing balance-Leahy Scale: Fair                              Cognition Arousal/Alertness: Awake/alert Behavior During Therapy: WFL for tasks assessed/performed Overall Cognitive Status: Within Functional Limits for tasks assessed                                 General Comments: pleasant and motivated to improve        Exercises Total Joint Exercises Ankle Circles/Pumps: AROM, Both, 10 reps, Supine Quad Sets: AROM, Both, 10 reps, Supine Long Arc Quad: AAROM, Right, 5 reps, Seated Knee Flexion: AAROM, Right, 5 reps, Seated Goniometric ROM: R knee in sitting -5 to 85    General Comments General comments (skin integrity, edema, etc.):  (R knee dressing intact without drainage. Pt educated on proper positioning of R LE and remaining functional goals)  Pertinent Vitals/Pain Pain Assessment Pain Assessment: 0-10 Pain Score: 6  Pain Location: R knee with activity Pain Descriptors / Indicators: Discomfort, Sore Pain Intervention(s): Premedicated before session, Ice applied    Home Living                          Prior Function            PT Goals (current goals can now be found in the care plan section) Acute Rehab PT Goals Patient Stated Goal: return home Progress towards PT goals: Progressing toward goals    Frequency    7X/week      PT Plan Current  plan remains appropriate    Co-evaluation              AM-PAC PT "6 Clicks" Mobility   Outcome Measure  Help needed turning from your back to your side while in a flat bed without using bedrails?: A Little Help needed moving from lying on your back to sitting on the side of a flat bed without using bedrails?: A Little Help needed moving to and from a bed to a chair (including a wheelchair)?: A Little Help needed standing up from a chair using your arms (e.g., wheelchair or bedside chair)?: A Little Help needed to walk in hospital room?: A Little Help needed climbing 3-5 steps with a railing? : A Little 6 Click Score: 18    End of Session Equipment Utilized During Treatment: Gait belt Activity Tolerance: Patient tolerated treatment well Patient left: in chair;with call bell/phone within reach;with chair alarm set Nurse Communication: Mobility status PT Visit Diagnosis: Other abnormalities of gait and mobility (R26.89);Muscle weakness (generalized) (M62.81)     Time: 4540-9811 PT Time Calculation (min) (ACUTE ONLY): 23 min  Charges:    $Gait Training: 8-22 mins $Therapeutic Exercise: 8-22 mins PT General Charges $$ ACUTE PT VISIT: 1 Visit                    Zadie Cleverly, PTA  Jannet Askew 12/30/2022, 12:48 PM

## 2022-12-30 NOTE — Progress Notes (Signed)
   12/30/22 2240  BiPAP/CPAP/SIPAP  BiPAP/CPAP/SIPAP Pt Type Adult  BiPAP/CPAP/SIPAP Resmed  Mask Type Full face mask (Patient is using her mask and tubing from home combined with our machine.)  FiO2 (%) 21 %  Patient Home Equipment Yes (only her mask and tubing.)  Auto Titrate Yes (Automode 5-20 cm H2O)

## 2022-12-30 NOTE — Plan of Care (Signed)
  Problem: Activity: Goal: Ability to avoid complications of mobility impairment will improve Outcome: Adequate for Discharge Goal: Range of joint motion will improve Outcome: Adequate for Discharge   Problem: Clinical Measurements: Goal: Postoperative complications will be avoided or minimized Outcome: Progressing   Problem: Pain Management: Goal: Pain level will decrease with appropriate interventions Outcome: Adequate for Discharge   Problem: Skin Integrity: Goal: Will show signs of wound healing Outcome: Progressing   Problem: Activity: Goal: Ability to avoid complications of mobility impairment will improve Outcome: Adequate for Discharge Goal: Range of joint motion will improve Outcome: Adequate for Discharge   Problem: Health Behavior/Discharge Planning: Goal: Ability to manage health-related needs will improve Outcome: Adequate for Discharge   Problem: Clinical Measurements: Goal: Ability to maintain clinical measurements within normal limits will improve Outcome: Progressing Goal: Will remain free from infection Outcome: Progressing Goal: Diagnostic test results will improve Outcome: Progressing Goal: Respiratory complications will improve Outcome: Adequate for Discharge Goal: Cardiovascular complication will be avoided Outcome: Adequate for Discharge   Problem: Activity: Goal: Risk for activity intolerance will decrease Outcome: Adequate for Discharge   Problem: Elimination: Goal: Will not experience complications related to bowel motility Outcome: Progressing   Problem: Pain Managment: Goal: General experience of comfort will improve Outcome: Adequate for Discharge   Problem: Safety: Goal: Ability to remain free from injury will improve Outcome: Adequate for Discharge

## 2022-12-30 NOTE — Progress Notes (Signed)
   12/29/22 2300  BiPAP/CPAP/SIPAP  BiPAP/CPAP/SIPAP Pt Type Adult  BiPAP/CPAP/SIPAP Resmed  Mask Type Full face mask (Own FFMask/Hose)  Respiratory Rate 18 breaths/min  FiO2 (%) 21 %  Patient Home Equipment Yes (Only Hose/FFMask)  Auto Titrate Yes  CPAP/SIPAP surface wiped down Yes   Pt. Placed on for h/s.

## 2022-12-31 ENCOUNTER — Other Ambulatory Visit (HOSPITAL_COMMUNITY): Payer: Self-pay

## 2022-12-31 NOTE — Progress Notes (Signed)
Patient discharged to home w/ family. Given all belongings, instructions, prescriptions from pharmacy. Verbalized understanding of instructions. Escorted to pov via w/c.

## 2022-12-31 NOTE — Progress Notes (Signed)
Physical Therapy Treatment Patient Details Name: Rebecca [name] MRN: [id number] DOB: September 25, 1955 Today's Date: 12/31/2022   History of Present Illness Pt is 67 yo female s/p R TKA revision on 12/28/22.  Pt with hx including but not limited to sleep apnea, obesity, gastric banding, arthritis, HTN, and HLD, R TKA 3/23.    PT Comments  Improved tolerance for transfers and gait training with RW on level surface ~135ft with Supervision and more normalized gait pattern/weight acceptance through R LE. Less c/o pain this pm as well. Pt educated on proper technique to negotiate stairs (pt has a ramp at home), proper use of cold pack wrap and application, as well as car transfers. Pt states she feels safe returning home today with family support. Pt plans to return to Out-pt PT for continued functional progression and strength. R knee 2-90 degrees in sitting.     Assistance Recommended at Discharge Intermittent Supervision/Assistance  If plan is discharge home, recommend the following:  Can travel by private vehicle    A little help with walking and/or transfers;A little help with bathing/dressing/bathroom;Assistance with cooking/housework;Help with stairs or ramp for entrance      Equipment Recommendations  None recommended by PT    Recommendations for Other Services       Precautions / Restrictions Precautions Precautions: Fall;Knee Precaution Booklet Issued: Yes (comment) Precaution Comments:  (HEP issued and reviewed) Restrictions Weight Bearing Restrictions: Yes RLE Weight Bearing: Weight bearing as tolerated     Mobility  Bed Mobility Overal bed mobility: Needs Assistance Bed Mobility: Supine to Sit     Supine to sit: Min guard (Pt plans to sleep in a Recliner upon d/c)     General bed mobility comments:  (Up in chair pre/post session)    Transfers Overall transfer level: Needs assistance Equipment used: Rolling walker (2 wheels) Transfers: Sit to/from Stand Sit to  Stand: Supervision           General transfer comment: From chair and toilet; cues for hand placement    Ambulation/Gait Ambulation/Gait assistance: Supervision Gait Distance (Feet): 180 Feet Assistive device: Rolling walker (2 wheels) Gait Pattern/deviations: Step-through pattern, Antalgic Gait velocity: decreased     General Gait Details:  (Improved gait tolerance and weight acceptance through R LE)   Stairs Stairs:  (Ramp at home)           Wheelchair Mobility     Tilt Bed    Modified Rankin (Stroke Patients Only)       Balance Overall balance assessment: Needs assistance Sitting-balance support: No upper extremity supported Sitting balance-Leahy Scale: Good     Standing balance support: Bilateral upper extremity supported, During functional activity, Reliant on assistive device for balance Standing balance-Leahy Scale: Fair Standing balance comment: Steady with RW ; able to do toielting adls and wash hands without support                            Cognition Arousal/Alertness: Awake/alert Behavior During Therapy: WFL for tasks assessed/performed Overall Cognitive Status: Within Functional Limits for tasks assessed                                 General Comments: pleasant and motivated to improve        Exercises Total Joint Exercises Ankle Circles/Pumps: AROM, Both, 10 reps, Supine Quad Sets: AROM, Both, 10 reps, Supine Long Arc Quad: AROM, Right,  10 reps Knee Flexion: AAROM, Right, 5 reps, Seated    General Comments General comments (skin integrity, edema, etc.): Pt educated on proper technique for stair negotiation if needed, as well as car transfers and HEP until return to Out-pt PT. Educated on ice pack wrap and use      Pertinent Vitals/Pain Pain Assessment Pain Assessment: 0-10 Pain Score: 3  Pain Location: R knee with activity Pain Descriptors / Indicators: Discomfort, Grimacing, Moaning, Operative site  guarding Pain Intervention(s): Monitored during session, Ice applied    Home Living                          Prior Function            PT Goals (current goals can now be found in the care plan section) Acute Rehab PT Goals Patient Stated Goal: return home Progress towards PT goals: Progressing toward goals    Frequency    7X/week      PT Plan Current plan remains appropriate    Co-evaluation              AM-PAC PT "6 Clicks" Mobility   Outcome Measure  Help needed turning from your back to your side while in a flat bed without using bedrails?: A Little Help needed moving from lying on your back to sitting on the side of a flat bed without using bedrails?: A Little Help needed moving to and from a bed to a chair (including a wheelchair)?: A Little Help needed standing up from a chair using your arms (e.g., wheelchair or bedside chair)?: A Little Help needed to walk in hospital room?: A Little Help needed climbing 3-5 steps with a railing? : A Little 6 Click Score: 18    End of Session Equipment Utilized During Treatment: Gait belt Activity Tolerance: Patient tolerated treatment well Patient left: in chair;with call bell/phone within reach;with chair alarm set Nurse Communication: Mobility status PT Visit Diagnosis: Other abnormalities of gait and mobility (R26.89);Muscle weakness (generalized) (M62.81)     Time: 1340-1404 PT Time Calculation (min) (ACUTE ONLY): 24 min  Charges:    $Gait Training: 8-22 mins $Therapeutic Exercise: 8-22 mins PT General Charges $$ ACUTE PT VISIT: 1 Visit                    Zadie Cleverly, PTA  Jannet Askew 12/31/2022, 3:38 PM

## 2022-12-31 NOTE — Plan of Care (Signed)
Agrees to request pain medication prior to severe pain.

## 2022-12-31 NOTE — Progress Notes (Signed)
Physical Therapy Treatment Patient Details Name: Rebecca [name] MRN: [id number] DOB: 01-Dec-1955 Today's Date: 12/31/2022   History of Present Illness Pt is 67 yo female s/p R TKA revision on 12/28/22.  Pt with hx including but not limited to sleep apnea, obesity, gastric banding, arthritis, HTN, and HLD, R TKA 3/23.    PT Comments  Pt received in bed, states she feels he pain is more controlled with scheduled pain meds. Pt tolerated increased gait distance with RW 127ft with Supervision and improved Right knee flexion/heel strike. Pt is anxious to return home today, however slightly unsteady standing in bathroom and will benefit from pm session prior to potential d/c home after 3pm. Pt has a ramp at home and states she will be sleeping in a recliner. Will continue to progress functional mobility after lunch.    Assistance Recommended at Discharge Intermittent Supervision/Assistance  If plan is discharge home, recommend the following:  Can travel by private vehicle    A little help with walking and/or transfers;A little help with bathing/dressing/bathroom;Assistance with cooking/housework;Help with stairs or ramp for entrance      Equipment Recommendations  None recommended by PT    Recommendations for Other Services       Precautions / Restrictions Precautions Precautions: Fall;Knee Restrictions Weight Bearing Restrictions: Yes RLE Weight Bearing: Weight bearing as tolerated     Mobility  Bed Mobility Overal bed mobility: Needs Assistance Bed Mobility: Supine to Sit     Supine to sit: Min guard (Pt plans to sleep in a Recliner upon d/c)          Transfers Overall transfer level: Needs assistance Equipment used: Rolling walker (2 wheels) Transfers: Sit to/from Stand Sit to Stand: Supervision           General transfer comment: From chair and toilet; cues for hand placement    Ambulation/Gait Ambulation/Gait assistance: Min guard, Supervision Gait Distance  (Feet):  (160) Assistive device: Rolling walker (2 wheels) Gait Pattern/deviations: Step-through pattern, Antalgic       General Gait Details:  (Increased Right knee flexion noted during swing through phase with improved heel strike.)   Stairs Stairs:  (Pt has a ramp at home)           Wheelchair Mobility     Tilt Bed    Modified Rankin (Stroke Patients Only)       Balance Overall balance assessment: Needs assistance Sitting-balance support: No upper extremity supported Sitting balance-Leahy Scale: Good     Standing balance support: Bilateral upper extremity supported, During functional activity, Reliant on assistive device for balance Standing balance-Leahy Scale: Fair Standing balance comment:  (Slight dizziness standing in bathroom after looking upward, no LOB)                            Cognition Arousal/Alertness: Awake/alert Behavior During Therapy: WFL for tasks assessed/performed Overall Cognitive Status: Within Functional Limits for tasks assessed                                 General Comments: pleasant and motivated to improve        Exercises Total Joint Exercises Ankle Circles/Pumps: AROM, Both, 10 reps, Supine Quad Sets: AROM, Both, 10 reps, Supine Long Arc Quad: AAROM, Right, 5 reps, Seated Knee Flexion: AAROM, Right, 5 reps, Seated    General Comments General comments (skin integrity, edema, etc.):  (Pt  educated on safe mobility with use of RW, weight shifting to Left to advance and facilitate more knee flexion during swing through)      Pertinent Vitals/Pain Pain Assessment Pain Assessment: 0-10 Pain Score: 6  Pain Location: R knee with activity Pain Descriptors / Indicators: Discomfort, Grimacing, Moaning, Operative site guarding Pain Intervention(s): Premedicated before session    Home Living                          Prior Function            PT Goals (current goals can now be found in  the care plan section) Acute Rehab PT Goals Patient Stated Goal: return home    Frequency    7X/week      PT Plan Current plan remains appropriate    Co-evaluation              AM-PAC PT "6 Clicks" Mobility   Outcome Measure  Help needed turning from your back to your side while in a flat bed without using bedrails?: A Little Help needed moving from lying on your back to sitting on the side of a flat bed without using bedrails?: A Little Help needed moving to and from a bed to a chair (including a wheelchair)?: A Little Help needed standing up from a chair using your arms (e.g., wheelchair or bedside chair)?: A Little Help needed to walk in hospital room?: A Little Help needed climbing 3-5 steps with a railing? : A Little 6 Click Score: 18    End of Session Equipment Utilized During Treatment: Gait belt Activity Tolerance: Patient tolerated treatment well Patient left: in chair;with call bell/phone within reach;with chair alarm set Nurse Communication: Mobility status PT Visit Diagnosis: Other abnormalities of gait and mobility (R26.89);Muscle weakness (generalized) (M62.81)     Time: 9562-1308 PT Time Calculation (min) (ACUTE ONLY): 29 min  Charges:    $Gait Training: 8-22 mins $Therapeutic Exercise: 8-22 mins PT General Charges $$ ACUTE PT VISIT: 1 Visit                    Zadie Cleverly, PTA  Jannet Askew 12/31/2022, 1:19 PM

## 2022-12-31 NOTE — Progress Notes (Signed)
   Subjective: 3 Days Post-Op Procedure(s) (LRB): Right knee femoral versus total knee arthroplasty revision (Right) Patient reports pain as mild.   Patient seen in rounds by Dr. Lequita Halt. Patient is  feeling better this morning. Pain increased yesterday and reported to be well controlled this morning.  No issues overnight. Denies chest pain, SOB, or calf pain.  We will continue therapy today, ambulated 30' yesterday afternoon.  Objective: Vital signs in last 24 hours: Temp:  [97.5 F (36.4 C)-98.7 F (37.1 C)] 98.7 F (37.1 C) (07/18 0517) Pulse Rate:  [80-93] 93 (07/18 0517) Resp:  [18] 18 (07/18 0517) BP: (127-156)/(60-70) 127/63 (07/18 0517) SpO2:  [95 %-100 %] 96 % (07/18 0517) FiO2 (%):  [21 %] 21 % (07/17 2240)  Intake/Output from previous day:  Intake/Output Summary (Last 24 hours) at 12/31/2022 0823 Last data filed at 12/31/2022 0600 Gross per 24 hour  Intake 780 ml  Output 200 ml  Net 580 ml     Intake/Output this shift: No intake/output data recorded.  Labs: Recent Labs    12/29/22 0332 12/30/22 0336  HGB 10.7* 11.0*   Recent Labs    12/29/22 0332 12/30/22 0336  WBC 10.4 13.0*  RBC 3.44* 3.54*  HCT 32.7* 33.9*  PLT 303 309   Recent Labs    12/29/22 0332 12/29/22 1406  NA 138 140  K 2.9* 4.2  CL 113* 108  CO2 19* 22  BUN 14 17  CREATININE 0.73 0.91  GLUCOSE 125* 144*  CALCIUM 7.1* 8.9   No results for input(s): "LABPT", "INR" in the last 72 hours.  Exam: General - Patient is Alert, Appropriate, and Oriented Extremity - Neurovascular intact Dorsiflexion/Plantar flexion intact Compartment soft Dressing - dressing C/D/I Motor Function - intact, moving foot and toes well on exam.   Past Medical History:  Diagnosis Date   Arthritis    Asthma    Blindness, legal    L EYE   Breast cancer (HCC)    GERD (gastroesophageal reflux disease)    Hearing loss    Hyperlipidemia    Hypertension    Lapband May 2013 11/18/2011   Migraines     Obesity, Class III, BMI 40-49.9 (morbid obesity) (HCC) 06/03/2011   Pre-diabetes    Sleep apnea    uses c-pap   Vertigo     Assessment/Plan: 3 Days Post-Op Procedure(s) (LRB): Right knee femoral versus total knee arthroplasty revision (Right) Principal Problem:   Failed total knee arthroplasty (HCC) Active Problems:   Failed total right knee replacement (HCC)  Estimated body mass index is 34.72 kg/m as calculated from the following:   Height as of this encounter: 5\' 3"  (1.6 m).   Weight as of this encounter: 88.9 kg. Up with therapy  DVT Prophylaxis - Xarelto Weight bearing as tolerated. Continue therapy today.  Plan is to go Home after hospital stay. Expect discharge later today if meeting goals. Scheduled for OPPT at [facility] in [place]. Follow-up in the office in 2 weeks.  The PDMP database was reviewed today prior to any opioid medications being prescribed to this patient.   Weston Brass, PA-C Orthopedic Surgery 12/31/2022, 8:23 AM

## 2023-01-01 LAB — AEROBIC/ANAEROBIC CULTURE W GRAM STAIN (SURGICAL/DEEP WOUND)

## 2023-01-02 LAB — AEROBIC/ANAEROBIC CULTURE W GRAM STAIN (SURGICAL/DEEP WOUND): Gram Stain: NONE SEEN

## 2023-01-21 NOTE — Discharge Summary (Signed)
Physician Discharge Summary   Patient ID: Rebecca [name] MRN: [id number] DOB/AGE: 1956-04-20 67 y.o.  Admit date: 12/28/2022 Discharge date: 12/31/2022  Primary Diagnosis: Failed right total knee arthroplasty  Admission Diagnoses:  Past Medical History:  Diagnosis Date   Arthritis    Asthma    Blindness, legal    L EYE   Breast cancer (HCC)    GERD (gastroesophageal reflux disease)    Hearing loss    Hyperlipidemia    Hypertension    Lapband May 2013 11/18/2011   Migraines    Obesity, Class III, BMI 40-49.9 (morbid obesity) (HCC) 06/03/2011   Pre-diabetes    Sleep apnea    uses c-pap   Vertigo    Discharge Diagnoses:   Principal Problem:   Failed total knee arthroplasty (HCC) Active Problems:   Failed total right knee replacement (HCC)  Estimated body mass index is 34.72 kg/m as calculated from the following:   Height as of this encounter: 5\' 3"  (1.6 m).   Weight as of this encounter: 88.9 kg.  Procedure:  Procedure(s) (LRB): Right knee femoral versus total knee arthroplasty revision (Right)   Consults: None  HPI: The patient is a 67 year old female who had a total knee arthroplasty done a little over a year ago.  She has had persistent discomfort and instability symptoms.  Workup for infection was negative.  She had a bone scan, which showed potential femoral loosening.  She has had extensive therapy and nonoperative management.  She presents now for femoral versus total knee arthroplasty revision.  Laboratory Data: Admission on 12/28/2022, Discharged on 12/31/2022  Component Date Value Ref Range Status   ABO/RH(D) 12/28/2022    Final                   Value:B POS Performed at [facility], [address] [place], [place] [address]    Specimen Description 12/28/2022    Final                   Value:WOUND Performed at [facility], [address], [place], [place] [address]    Special Requests 12/28/2022    Final                    Value:NONE Performed at [facility], [address] [place], [place] [address]    Gram Stain 12/28/2022    Final                   Value:NO WBC SEEN NO ORGANISMS SEEN    Culture 12/28/2022    Final                   Value:No growth aerobically or anaerobically. Performed at [facility], [address], [place], [place] [address]    Report Status 12/28/2022 01/02/2023 FINAL   Final   WBC 12/29/2022 10.4  4.0 - 10.5 K/uL Final   RBC 12/29/2022 3.44 (L)  3.87 - 5.11 MIL/uL Final   Hemoglobin 12/29/2022 10.7 (L)  12.0 - 15.0 g/dL Final   HCT 27/25/3664 32.7 (L)  36.0 - 46.0 % Final   MCV 12/29/2022 95.1  80.0 - 100.0 fL Final   MCH 12/29/2022 31.1  26.0 - 34.0 pg Final   MCHC 12/29/2022 32.7  30.0 - 36.0 g/dL Final   RDW 40/34/7425 13.0  11.5 - 15.5 % Final   Platelets 12/29/2022 303  150 - 400 K/uL Final  nRBC 12/29/2022 0.0  0.0 - 0.2 % Final   Performed at [facility], [address] [place], [place] [address]   Sodium 12/29/2022 138  135 - 145 mmol/L Final   Potassium 12/29/2022 2.9 (L)  3.5 - 5.1 mmol/L Final   Chloride 12/29/2022 113 (H)  98 - 111 mmol/L Final   CO2 12/29/2022 19 (L)  22 - 32 mmol/L Final   Glucose, Bld 12/29/2022 125 (H)  70 - 99 mg/dL Final   Glucose reference range applies only to samples taken after fasting for at least 8 hours.   BUN 12/29/2022 14  8 - 23 mg/dL Final   Creatinine, Ser 12/29/2022 0.73  0.44 - 1.00 mg/dL Final   Calcium 60/45/4098 7.1 (L)  8.9 - 10.3 mg/dL Final   GFR, Estimated 12/29/2022 >60  >60 mL/min Final   Comment: (NOTE) Calculated using the CKD-EPI Creatinine Equation (2021)    Anion gap 12/29/2022 6  5 - 15 Final   Performed at [facility], [address] [place], [place] [address]   Sodium 12/29/2022 140  135 - 145 mmol/L Final   Potassium 12/29/2022 4.2  3.5 - 5.1 mmol/L Final   Chloride 12/29/2022 108  98 - 111 mmol/L Final   CO2 12/29/2022 22  22 - 32  mmol/L Final   Glucose, Bld 12/29/2022 144 (H)  70 - 99 mg/dL Final   Glucose reference range applies only to samples taken after fasting for at least 8 hours.   BUN 12/29/2022 17  8 - 23 mg/dL Final   Creatinine, Ser 12/29/2022 0.91  0.44 - 1.00 mg/dL Final   Calcium 78/29/5621 8.9  8.9 - 10.3 mg/dL Final   GFR, Estimated 12/29/2022 >60  >60 mL/min Final   Comment: (NOTE) Calculated using the CKD-EPI Creatinine Equation (2021)    Anion gap 12/29/2022 10  5 - 15 Final   Performed at [facility], [address] [place], [place] [address]   WBC 12/30/2022 13.0 (H)  4.0 - 10.5 K/uL Final   RBC 12/30/2022 3.54 (L)  3.87 - 5.11 MIL/uL Final   Hemoglobin 12/30/2022 11.0 (L)  12.0 - 15.0 g/dL Final   HCT 78/46/9629 33.9 (L)  36.0 - 46.0 % Final   MCV 12/30/2022 95.8  80.0 - 100.0 fL Final   MCH 12/30/2022 31.1  26.0 - 34.0 pg Final   MCHC 12/30/2022 32.4  30.0 - 36.0 g/dL Final   RDW 52/84/1324 13.1  11.5 - 15.5 % Final   Platelets 12/30/2022 309  150 - 400 K/uL Final   nRBC 12/30/2022 0.0  0.0 - 0.2 % Final   Performed at [facility], [address], [place], [place] [address]  Hospital Outpatient Visit on 12/22/2022  Component Date Value Ref Range Status   MRSA, PCR 12/22/2022 NEGATIVE  NEGATIVE Final   Staphylococcus aureus 12/22/2022 NEGATIVE  NEGATIVE Final   Comment: (NOTE) The Xpert SA Assay (FDA approved for NASAL specimens in patients 12 years of age and older), is one component of a comprehensive surveillance program. It is not intended to diagnose infection nor to guide or monitor treatment. Performed at [facility], [address], [place], [place] [address]    Sodium 12/22/2022 143  135 - 145 mmol/L Final   Potassium 12/22/2022 4.1  3.5 - 5.1 mmol/L Final   Chloride 12/22/2022 109  98 - 111 mmol/L Final   CO2 12/22/2022 25  22 - 32 mmol/L  Final   Glucose, Bld 12/22/2022 104 (H)  70 - 99 mg/dL Final   Glucose  reference range applies only to samples taken after fasting for at least 8 hours.   BUN 12/22/2022 18  8 - 23 mg/dL Final   Creatinine, Ser 12/22/2022 0.77  0.44 - 1.00 mg/dL Final   Calcium 16/03/9603 9.2  8.9 - 10.3 mg/dL Final   GFR, Estimated 12/22/2022 >60  >60 mL/min Final   Comment: (NOTE) Calculated using the CKD-EPI Creatinine Equation (2021)    Anion gap 12/22/2022 9  5 - 15 Final   Performed at [facility], [address], [place], [place] [address]   WBC 12/22/2022 6.2  4.0 - 10.5 K/uL Final   RBC 12/22/2022 4.13  3.87 - 5.11 MIL/uL Final   Hemoglobin 12/22/2022 12.6  12.0 - 15.0 g/dL Final   HCT 11/91/4782 38.7  36.0 - 46.0 % Final   MCV 12/22/2022 93.7  80.0 - 100.0 fL Final   MCH 12/22/2022 30.5  26.0 - 34.0 pg Final   MCHC 12/22/2022 32.6  30.0 - 36.0 g/dL Final   RDW 95/62/1308 13.0  11.5 - 15.5 % Final   Platelets 12/22/2022 358  150 - 400 K/uL Final   nRBC 12/22/2022 0.0  0.0 - 0.2 % Final   Performed at [facility], [address] [place], [place] [address]   ABO/RH(D) 12/22/2022 B POS   Final   Antibody Screen 12/22/2022 NEG   Final   Sample Expiration 12/22/2022 12/31/2022,2359   Final   Extend sample reason 12/22/2022    Final                   Value:NO TRANSFUSIONS OR PREGNANCY IN THE PAST 3 MONTHS Performed at [facility], [address], [place], [place] [address]      X-Rays:No results found.  EKG: Orders placed or performed during the hospital encounter of 02/04/22   ED EKG   ED EKG     Hospital Course: Rebecca [name] is a 67 y.o. who was admitted to [facility]. They were brought to the operating room on 12/28/2022 and underwent Procedure(s): Right knee femoral versus total knee arthroplasty revision.  Patient tolerated the procedure well and was later transferred to the recovery room and then to the orthopaedic floor for postoperative care. They were given PO and IV analgesics  for pain control following their surgery. They were given 24 hours of postoperative antibiotics of  Anti-infectives (From admission, onward)    Start     Dose/Rate Route Frequency Ordered Stop   12/28/22 1930  ceFAZolin (ANCEF) IVPB 2g/100 mL premix        2 g 200 mL/hr over 30 Minutes Intravenous Every 6 hours 12/28/22 1640 12/29/22 0448   12/28/22 1030  ceFAZolin (ANCEF) IVPB 2g/100 mL premix        2 g 200 mL/hr over 30 Minutes Intravenous On call to O.R. 12/28/22 1020 12/28/22 1351      and started on DVT prophylaxis in the form of Xarelto.   PT and OT were ordered for total joint protocol. Discharge planning consulted to help with postop disposition and equipment needs. Patient had an uneventful night on the evening of surgery. They started to get up OOB with therapy on POD 1. Continued to work with therapy into POD #3. Pt was seen during rounds on day two and was ready to go home pending progress with therapy. Dressing  was clean dry and intact. Pt worked with therapy for an additional session she and was meeting their goals. She was discharged to home later that day in stable condition.  Diet: Diabetic diet Activity: WBAT Follow-up: in 2 weeks Disposition: Home Discharged Condition: good   Discharge Instructions     Call MD / Call 911   Complete by: As directed    If you experience chest pain or shortness of breath, CALL 911 and be transported to the hospital emergency room.  If you develope a fever above 101 F, pus (white drainage) or increased drainage or redness at the wound, or calf pain, call your surgeon's office.   Change dressing   Complete by: As directed    You may remove the bulky bandage (ACE wrap and gauze) two days after surgery. You will have an adhesive waterproof bandage underneath. Leave this in place until your first follow-up appointment.   Constipation Prevention   Complete by: As directed    Drink plenty of fluids.  Prune juice may be helpful.  You may use a  stool softener, such as Colace (over the counter) 100 mg twice a day.  Use MiraLax (over the counter) for constipation as needed.   Diet - low sodium heart healthy   Complete by: As directed    Do not put a pillow under the knee. Place it under the heel.   Complete by: As directed    Driving restrictions   Complete by: As directed    No driving for two weeks   Post-operative opioid taper instructions:   Complete by: As directed    POST-OPERATIVE OPIOID TAPER INSTRUCTIONS: It is important to wean off of your opioid medication as soon as possible. If you do not need pain medication after your surgery it is ok to stop day one. Opioids include: Codeine, Hydrocodone(Norco, Vicodin), Oxycodone(Percocet, oxycontin) and hydromorphone amongst others.  Long term and even short term use of opiods can cause: Increased pain response Dependence Constipation Depression Respiratory depression And more.  Withdrawal symptoms can include Flu like symptoms Nausea, vomiting And more Techniques to manage these symptoms Hydrate well Eat regular healthy meals Stay active Use relaxation techniques(deep breathing, meditating, yoga) Do Not substitute Alcohol to help with tapering If you have been on opioids for less than two weeks and do not have pain than it is ok to stop all together.  Plan to wean off of opioids This plan should start within one week post op of your joint replacement. Maintain the same interval or time between taking each dose and first decrease the dose.  Cut the total daily intake of opioids by one tablet each day Next start to increase the time between doses. The last dose that should be eliminated is the evening dose.      TED hose   Complete by: As directed    Use stockings (TED hose) for three weeks on both leg(s).  You may remove them at night for sleeping.   Weight bearing as tolerated   Complete by: As directed       Allergies as of 12/31/2022       Reactions    Hydrocodone Hives, Shortness Of Breath, Itching   Sulfa Antibiotics Hives        Medication List     STOP taking these medications    ibuprofen 800 MG tablet Commonly known as: ADVIL   montelukast 10 MG tablet Commonly known as: SINGULAIR   multivitamin with minerals tablet  tamoxifen 20 MG tablet Commonly known as: NOLVADEX   VITAMIN B-12 PO       TAKE these medications    albuterol (2.5 MG/3ML) 0.083% nebulizer solution Commonly known as: PROVENTIL Take 3 mLs (2.5 mg total) by nebulization every 4 (four) hours as needed for wheezing or shortness of breath.   albuterol 108 (90 Base) MCG/ACT inhaler Commonly known as: Ventolin HFA Use 2 puffs every 4 hours as needed for cough or wheeze.  May use  2 puffs 10-20 minutes prior to exercise.   Alvesco 160 MCG/ACT inhaler Generic drug: ciclesonide Inhale 1 puff into the lungs 2 (two) times daily. What changed:  when to take this reasons to take this   hydrochlorothiazide 12.5 MG tablet Commonly known as: HYDRODIURIL TAKE 1 TABLET BY MOUTH EVERY DAY   lisinopril 30 MG tablet Commonly known as: ZESTRIL TAKE 1 TABLET BY MOUTH EVERY DAY   methocarbamol 500 MG tablet Commonly known as: ROBAXIN Take 1 tablet (500 mg total) by mouth every 6 (six) hours as needed for muscle spasms.   ondansetron 4 MG tablet Commonly known as: ZOFRAN Take 1 tablet (4 mg total) by mouth every 6 (six) hours as needed for nausea.   oxyCODONE 5 MG immediate release tablet Commonly known as: Oxy IR/ROXICODONE Take 1-2 tablets (5-10 mg total) by mouth every 6 (six) hours as needed for severe pain.   traMADol 50 MG tablet Commonly known as: ULTRAM Take 1-2 tablets (50-100 mg total) by mouth every 6 (six) hours as needed for moderate pain.   Xarelto 10 MG Tabs tablet Generic drug: rivaroxaban Take 1 tablet (10 mg total) by mouth daily with breakfast. Then take one 81 mg aspirin once a day for three weeks. Then discontinue aspirin.                Discharge Care Instructions  (From admission, onward)           Start     Ordered   12/30/22 0000  Weight bearing as tolerated        12/30/22 0822   12/30/22 0000  Change dressing       Comments: You may remove the bulky bandage (ACE wrap and gauze) two days after surgery. You will have an adhesive waterproof bandage underneath. Leave this in place until your first follow-up appointment.   12/30/22 1610            Follow-up Information     Ollen Gross, MD. Schedule an appointment as soon as possible for a visit in 2 week(s).   Specialty: Orthopedic Surgery Contact information: [address] [place] [address] [phone]                 Signed: Weston Brass, PA-C Orthopedic Surgery 01/21/2023, 11:48 AM

## 2023-01-24 ENCOUNTER — Other Ambulatory Visit: Payer: Self-pay | Admitting: Family Medicine

## 2023-02-03 ENCOUNTER — Inpatient Hospital Stay: Payer: 59 | Attending: Hematology and Oncology | Admitting: Hematology and Oncology

## 2023-02-03 DIAGNOSIS — D0512 Intraductal carcinoma in situ of left breast: Secondary | ICD-10-CM

## 2023-02-03 MED ORDER — VENLAFAXINE HCL ER 37.5 MG PO CP24: 37.5000 mg | ORAL_CAPSULE | Freq: Every day | ORAL | 1 refills | Status: DC

## 2023-02-03 NOTE — Assessment & Plan Note (Signed)
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   Final pathology reviewed, significant for DCIS, abutting posterior margin.  DCIS is intermediate to high-grade. She went through re excision. She is now post adj radiation, healing well.  We have once again discussed about options for anti estrogen therapy. She would like to try anastrozole. We discussed adverse effects including post menopausal symptoms, vaginal dryness, bone loss. She will RTC in 3 months for SCP.  Benay Pike MD

## 2023-02-03 NOTE — Progress Notes (Unsigned)
BRIEF ONCOLOGIC HISTORY:  Oncology History  Ductal carcinoma in situ (DCIS) of left breast  03/17/2022 Mammogram   New solid left breast mass identified at 11:30, 2 cm from the nipple. Targeted ultrasound is performed, showing a mass at 11:30, 2 cm from the nipple measuring 7 x 3 x 8 mm, solid in appearance. No axillary adenopathy.    Pathology Results   Pathology from left breast showed DCIS, intermediate to high nuclear grade, solid and cribriform types with focal necrosis.  Negative for invasive carcinoma.  Negative for microcalcifications.  Prognostics showed ER 95% positive strong staining PR 40% positive strong staining.   04/21/2022 Surgery   Left lumpectomy, DCIS 1.6 cm, intermediate to high grade, DCIS abuts posterior, superior, and lateral margin.   04/21/2022 Cancer Staging   Staging form: Breast, AJCC 8th Edition - Pathologic stage from 04/21/2022: Stage 0 (pTis (DCIS), pN0, cM0, ER+, PR+) - Signed by Loa Socks, NP on 11/03/2022 Stage prefix: Initial diagnosis Nuclear grade: G3   05/05/2022 Surgery   RE excision to clear margin   05/27/2022 - 06/26/2022 Radiation Therapy   Plan Name: Breast_L Site: Breast, Left Technique: 3D Mode: Photon Dose Per Fraction: 2.67 Gy Prescribed Dose (Delivered / Prescribed): 40.05 Gy / 40.05 Gy Prescribed Fxs (Delivered / Prescribed): 15 / 15   Plan Name: Breast_L_Bst Site: Breast, Left Technique: 3D Mode: Photon Dose Per Fraction: 2 Gy Prescribed Dose (Delivered / Prescribed): 12 Gy / 12 Gy Prescribed Fxs (Delivered / Prescribed): 6 / 6   07/2022 -  Anti-estrogen oral therapy   Anastrozole     INTERVAL HISTORY:   This is a follow-up telephone visit.  Since her last visit here, we have recommended that she try tamoxifen since she could not tolerate anastrozole well but she apparently tried the tamoxifen and also had tremendous hot flashes and hence stopped.  She however tells me that her family is not happy with her  decision she wants to try it again but was hoping we can help her with the hot flashes since the day and night and they are very bothersome.  She says the irritability is not as bad with tamoxifen as with anastrozole. Rest of the pertinent 10 point ROS reviewed and negative  PAST MEDICAL/SURGICAL HISTORY:  Past Medical History:  Diagnosis Date   Arthritis    Asthma    Blindness, legal    L EYE   Breast cancer (HCC)    GERD (gastroesophageal reflux disease)    Hearing loss    Hyperlipidemia    Hypertension    Lapband May 2013 11/18/2011   Migraines    Obesity, Class III, BMI 40-49.9 (morbid obesity) (HCC) 06/03/2011   Pre-diabetes    Sleep apnea    uses c-pap   Vertigo    Past Surgical History:  Procedure Laterality Date   ABDOMINAL HYSTERECTOMY  2006   fibroids   BREAST LUMPECTOMY WITH RADIOACTIVE SEED LOCALIZATION Left 04/21/2022   Procedure: LEFT BREAST LUMPECTOMY WITH RADIOACTIVE SEED LOCALIZATION;  Surgeon: Manus Rudd, MD;  Location: [facility];  Service: General;  Laterality: Left;   BREATH TEK H PYLORI  08/17/2011   Procedure: BREATH TEK H PYLORI;  Surgeon: Valarie Merino, MD;  Location: [facility];  Service: General;  Laterality: N/A;  PATIENT WILL COME AT 0715   COLONOSCOPY  2008   @ [facility]    EYE SURGERY     Patient unsure of surgery date. Left eye   KNEE SURGERY Right 1992  right knee arthroscopy   LAPAROSCOPIC GASTRIC BANDING  10/20/2011   Procedure: LAPAROSCOPIC GASTRIC BANDING;  Surgeon: Valarie Merino, MD;  Location: [facility];  Service: General;  Laterality: N/A;   RE-EXCISION OF BREAST LUMPECTOMY Left 05/05/2022   Procedure: RE-EXCISION OF LEFT BREAST LUMPECTOMY SITE POSTERIOR MARGIN;  Surgeon: Manus Rudd, MD;  Location: [facility];  Service: General;  Laterality: Left;   TOTAL KNEE ARTHROPLASTY Right 08/18/2021   Procedure: TOTAL KNEE ARTHROPLASTY;  Surgeon: Ollen Gross, MD;  Location: [facility];  Service:  Orthopedics;  Laterality: Right;   TOTAL KNEE REVISION Right 12/28/2022   Procedure: Right knee femoral versus total knee arthroplasty revision;  Surgeon: Ollen Gross, MD;  Location: [facility];  Service: Orthopedics;  Laterality: Right;     ALLERGIES:  Allergies  Allergen Reactions   Hydrocodone Hives, Shortness Of Breath and Itching   Sulfa Antibiotics Hives     CURRENT MEDICATIONS:  Outpatient Encounter Medications as of 02/03/2023  Medication Sig   albuterol (PROVENTIL) (2.5 MG/3ML) 0.083% nebulizer solution Take 3 mLs (2.5 mg total) by nebulization every 4 (four) hours as needed for wheezing or shortness of breath.   albuterol (VENTOLIN HFA) 108 (90 Base) MCG/ACT inhaler Use 2 puffs every 4 hours as needed for cough or wheeze.  May use  2 puffs 10-20 minutes prior to exercise.   ALVESCO 160 MCG/ACT inhaler Inhale 1 puff into the lungs 2 (two) times daily. (Patient taking differently: Inhale 1 puff into the lungs 2 (two) times daily as needed (respiratory issues.).)   hydrochlorothiazide (HYDRODIURIL) 12.5 MG tablet TAKE 1 TABLET BY MOUTH EVERY DAY   lisinopril (ZESTRIL) 30 MG tablet TAKE 1 TABLET BY MOUTH EVERY DAY   methocarbamol (ROBAXIN) 500 MG tablet Take 1 tablet (500 mg total) by mouth every 6 (six) hours as needed for muscle spasms.   ondansetron (ZOFRAN) 4 MG tablet Take 1 tablet (4 mg total) by mouth every 6 (six) hours as needed for nausea.   oxyCODONE (OXY IR/ROXICODONE) 5 MG immediate release tablet Take 1-2 tablets (5-10 mg total) by mouth every 6 (six) hours as needed for severe pain.   rivaroxaban (XARELTO) 10 MG TABS tablet Take 1 tablet (10 mg total) by mouth daily with breakfast. Then take one 81 mg aspirin once a day for three weeks. Then discontinue aspirin.   traMADol (ULTRAM) 50 MG tablet Take 1-2 tablets (50-100 mg total) by mouth every 6 (six) hours as needed for moderate pain.   No facility-administered encounter medications on file as of 02/03/2023.      ONCOLOGIC FAMILY HISTORY:  Family History  Problem Relation Age of Onset   Breast cancer Mother 81   Alcohol abuse Father    Heart disease Father 54       MI   Hypertension Father    Colon cancer Brother        dx after 70   Breast cancer Daughter 28      SOCIAL HISTORY:  Social History   Socioeconomic History   Marital status: Married    Spouse name: Not on file   Number of children: Not on file   Years of education: Not on file   Highest education level: Not on file  Occupational History   Not on file  Tobacco Use   Smoking status: Former    Current packs/day: 0.00    Average packs/day: 0.5 packs/day for 15.0 years (7.5 ttl pk-yrs)    Types: Cigarettes    Start date:  06/03/1979    Quit date: 06/02/1994    Years since quitting: 28.6   Smokeless tobacco: Never  Vaping Use   Vaping status: Never Used  Substance and Sexual Activity   Alcohol use: No   Drug use: No   Sexual activity: Not Currently    Birth control/protection: Surgical    Comment: hyst  Other Topics Concern   Not on file  Social History Narrative   Work or School: works part time with [employer] - 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       Home Situation: lives with husband      Spiritual Beliefs: Christian  - Gaffer, Higher education careers adviser      Lifestyle: wt watchers 2019, starting to exercise         Social Determinants of Health   Financial Resource Strain: Low Risk  (04/01/2022)   Overall Financial Resource Strain (CARDIA)    Difficulty of Paying Living Expenses: Not very hard  Food Insecurity: No Food Insecurity (12/28/2022)   Hunger Vital Sign    Worried About Running Out of Food in the Last Year: Never true    Ran Out of Food in the Last Year: Never true  Transportation Needs: No Transportation Needs (12/28/2022)   PRAPARE - Administrator, Civil Service (Medical): No    Lack of Transportation (Non-Medical): No  Physical Activity: Inactive  (10/06/2021)   Exercise Vital Sign    Days of Exercise per Week: 0 days    Minutes of Exercise per Session: 0 min  Stress: No Stress Concern Present (10/06/2021)   [employer] of Occupational Health - Occupational Stress Questionnaire    Feeling of Stress : Not at all  Social Connections: Unknown (10/24/2021)   Received from [facility], [facility]   Social Network    Social Network: Not on file  Intimate Partner Violence: Not At Risk (12/28/2022)   Humiliation, Afraid, Rape, and Kick questionnaire    Fear of Current or Ex-Partner: No    Emotionally Abused: No    Physically Abused: No    Sexually Abused: No     OBSERVATIONS/OBJECTIVE:  There were no vitals taken for this visit.  Physical exam not done, telephone visit LABORATORY DATA:  None for this visit.  DIAGNOSTIC IMAGING:  None for this visit.    ASSESSMENT AND PLAN:  Ms.. Scantling is a pleasant 67 y.o. female with Stage 0 left breast DCIS, ER+/PR+, diagnosed in October 2023, treated with lumpectomy, adjuvant radiation therapy, and anti-estrogen therapy with anastrozole beginning in 2024.  She tried anastrozole for a couple months but has noted that she was very irritable had tremendous amount of hot flashes hence wanted to try something different so we prescribed tamoxifen.  She tried tamoxifen for few weeks and apparently has noticed hot flashes again, the irritability was not as bad but once again stopped it.  She however expresses that her family is not very happy with her decision and she is hoping to go back on tamoxifen with some help for the vasomotor symptoms.  Since the hot flashes are both diurnal and nocturnal, discussed about trying low-dose venlafaxine which may also help with the mood swings.  We have also once again discussed about the risk of DVT/PE with tamoxifen and the symptoms and signs of VTE  Will plan to follow-up with her in 3 months.  She is already scheduled for mammogram in September She was  encouraged to call us with any new  questions or concerns. Total time: 20 min  I connected with  [name] on 02/04/23 by a telephone application and verified that I am speaking with the correct person using two identifiers.   I discussed the limitations of evaluation and management by telemedicine. The patient expressed understanding and agreed to proceed.   *Total Encounter Time as defined by the Centers for Medicare and Medicaid Services includes, in addition to the face-to-face time of a patient visit (documented in the note above) non-face-to-face time: obtaining and reviewing outside history, ordering and reviewing medications, tests or procedures, care coordination (communications with other health care professionals or caregivers) and documentation in the medical record.

## 2023-02-04 ENCOUNTER — Other Ambulatory Visit (HOSPITAL_COMMUNITY): Payer: 59

## 2023-02-18 ENCOUNTER — Other Ambulatory Visit: Payer: Self-pay | Admitting: Family Medicine

## 2023-02-26 ENCOUNTER — Other Ambulatory Visit: Payer: Self-pay | Admitting: Hematology and Oncology

## 2023-03-17 ENCOUNTER — Ambulatory Visit
Admission: RE | Admit: 2023-03-17 | Discharge: 2023-03-17 | Disposition: A | Discharge status: Home / Self Care | Payer: 59 | Source: Ambulatory Visit | Attending: Adult Health | Admitting: Adult Health

## 2023-03-17 DIAGNOSIS — D0512 Intraductal carcinoma in situ of left breast: Secondary | ICD-10-CM

## 2023-04-11 IMAGING — CT CT HEAD W/O CM
3 series · 16 of 47 positions shown, 19 images · non-contrast
Comparison: None.

CLINICAL DATA: Headache

EXAM:
CT HEAD WITHOUT CONTRAST
TECHNIQUE: Contiguous axial images were obtained from the base of the skull
through the vertex without intravenous contrast.

[Series 2: head wo · axial · 0.43mm/px · z∈[-184,-49]mm · 10 of 33 slices shown, 13 images]
[im 3/33  brain]
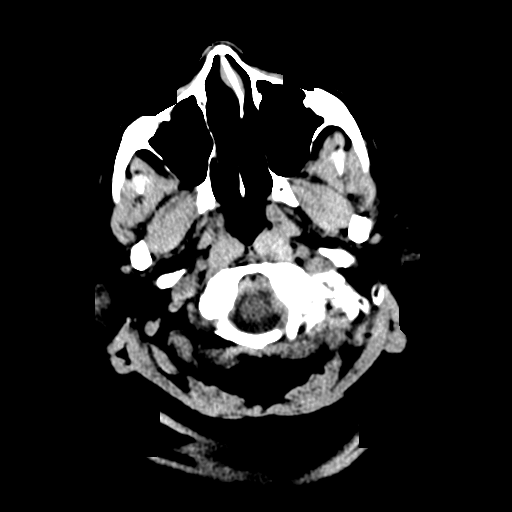
[im 3/33  bone]
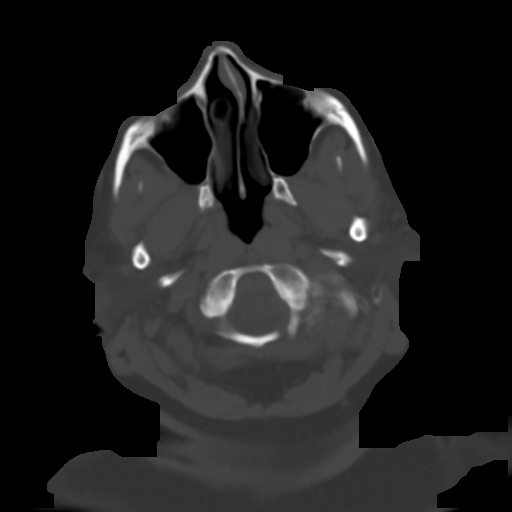
[im 6/33  brain]
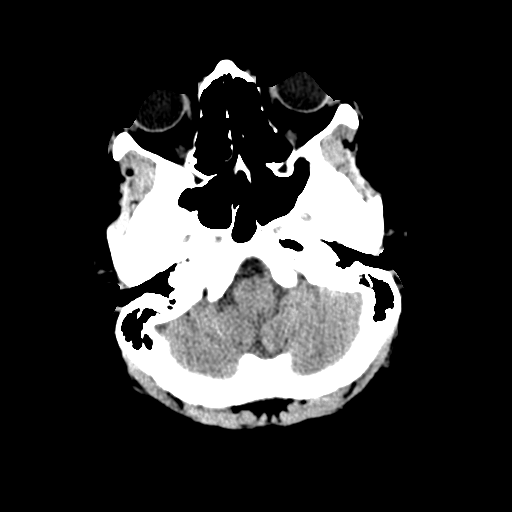
[im 9/33  brain]
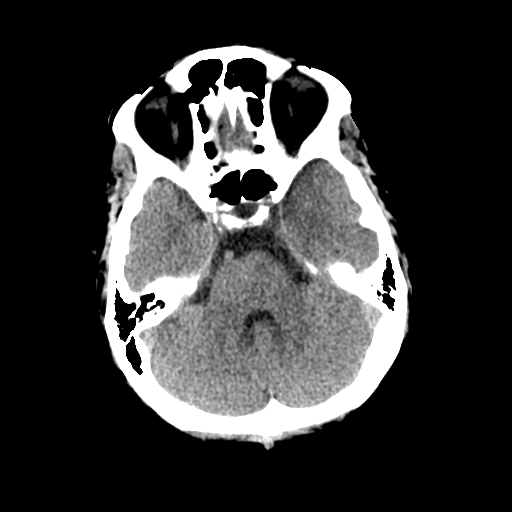
[im 12/33  brain]
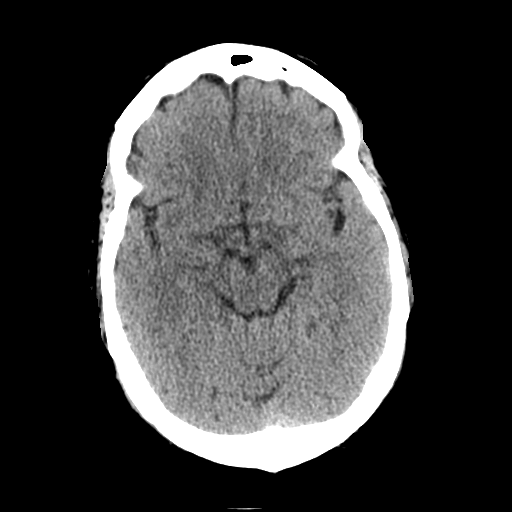
[im 15/33  brain]
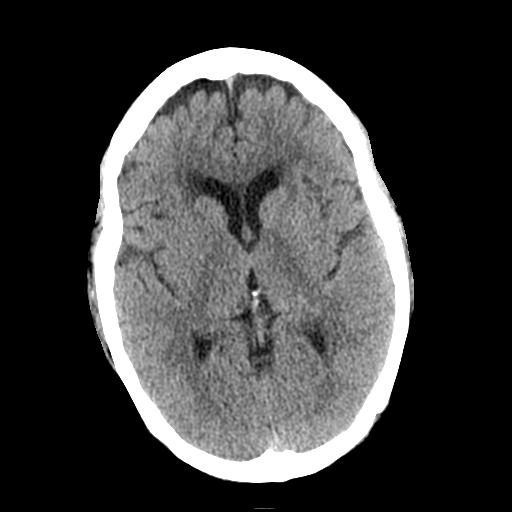
[im 15/33  bone]
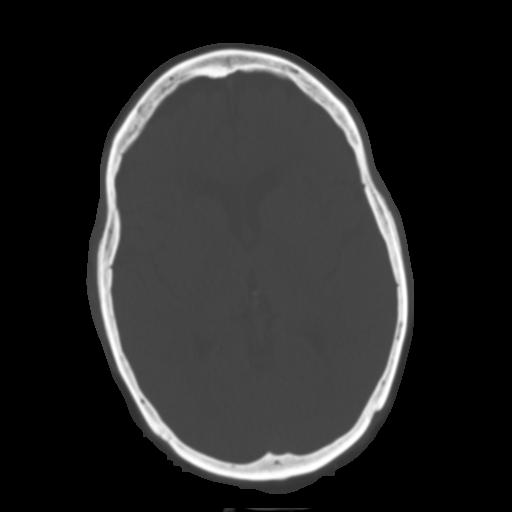
[im 18/33  brain]
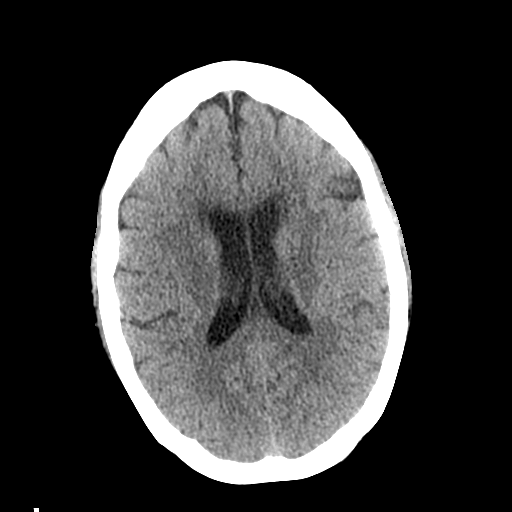
[im 21/33  brain]
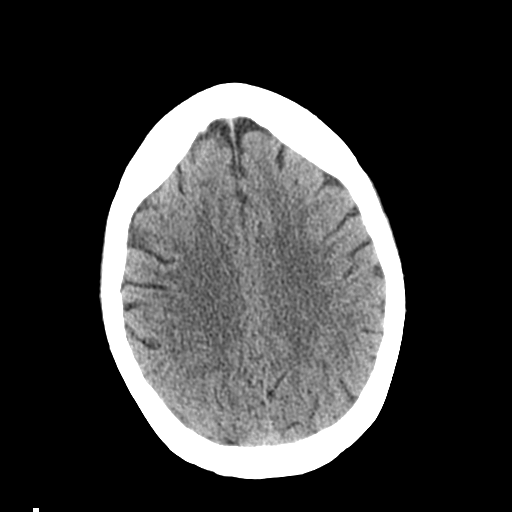
[im 25/33  brain]
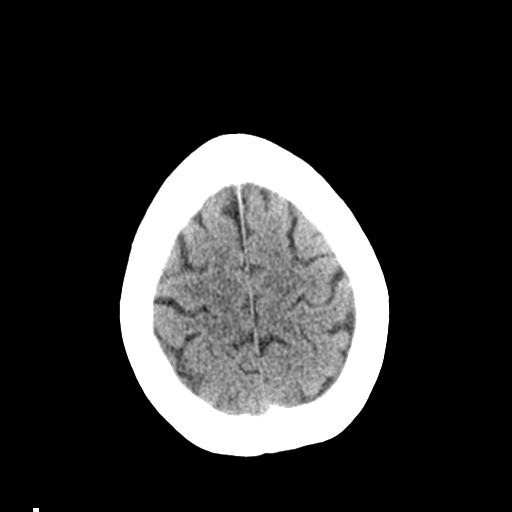
[im 27/33  brain]
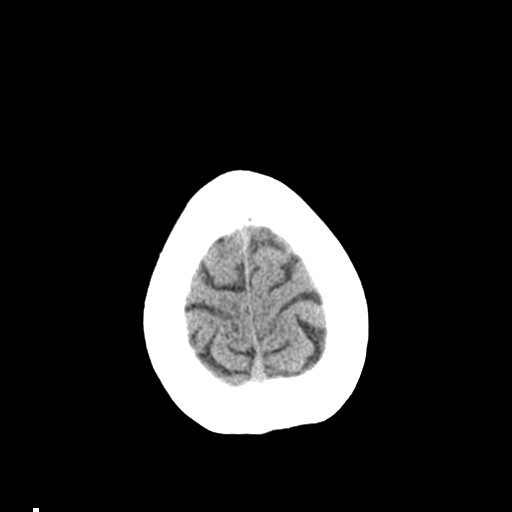
[im 27/33  bone]
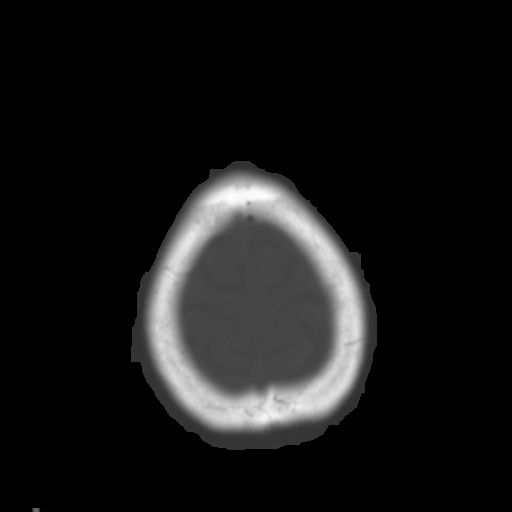
[im 30/33  brain]
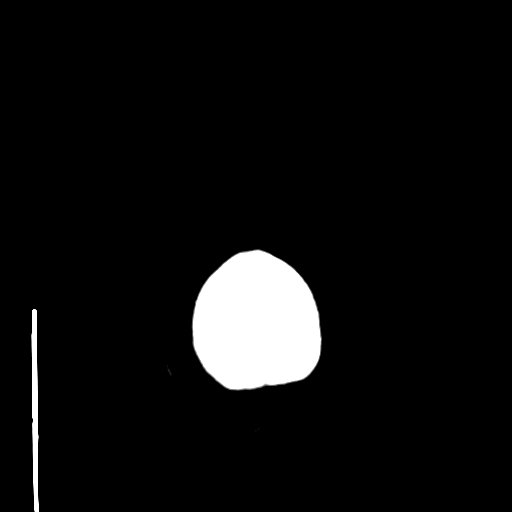

[Series 5: coronal soft tissue · coronal · 0.32mm/px · 3 of 70 slices shown]
[im 24/70  brain]
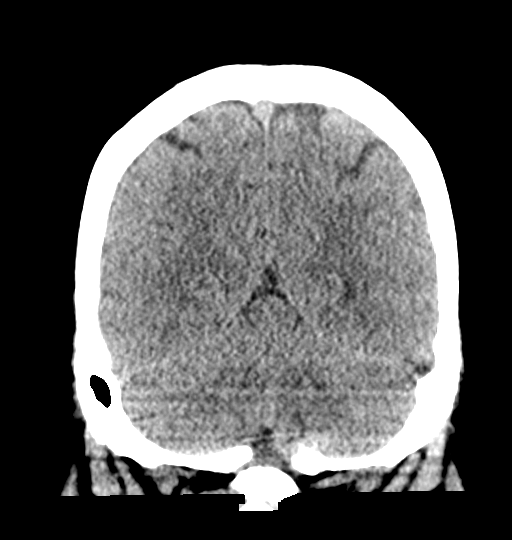
[im 31/70  brain]
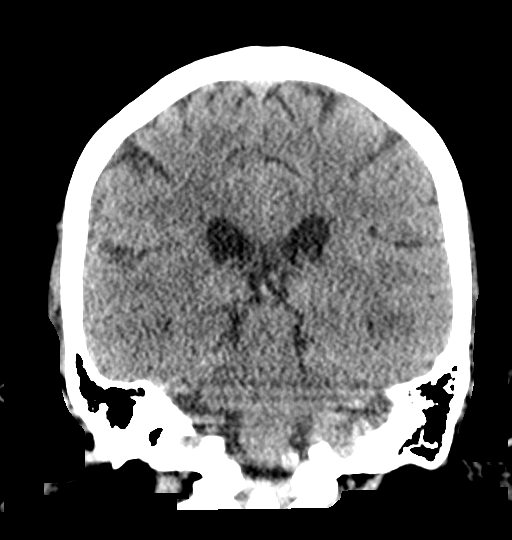
[im 39/70  brain]
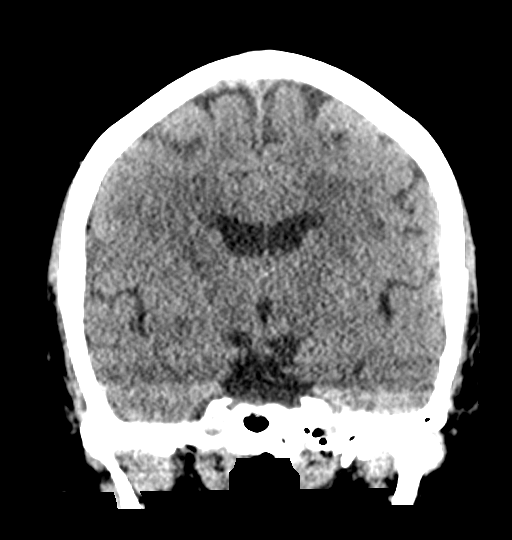

[Series 6: sagittal soft tissue · sagittal · 0.34mm/px · 3 of 55 slices shown]
[im 19/55  brain]
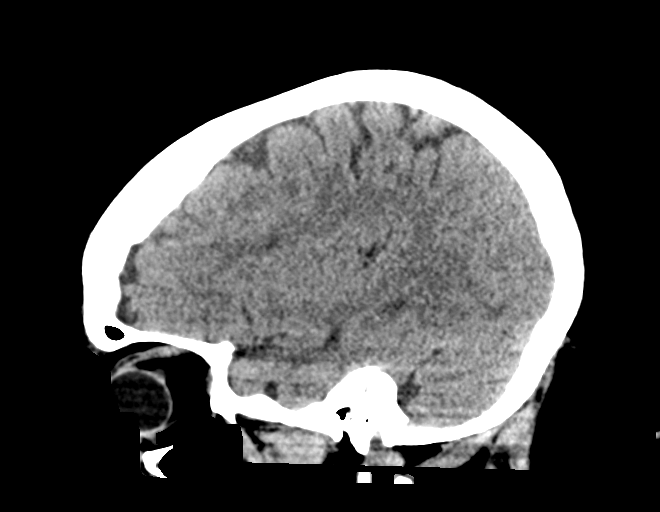
[im 28/55  brain]
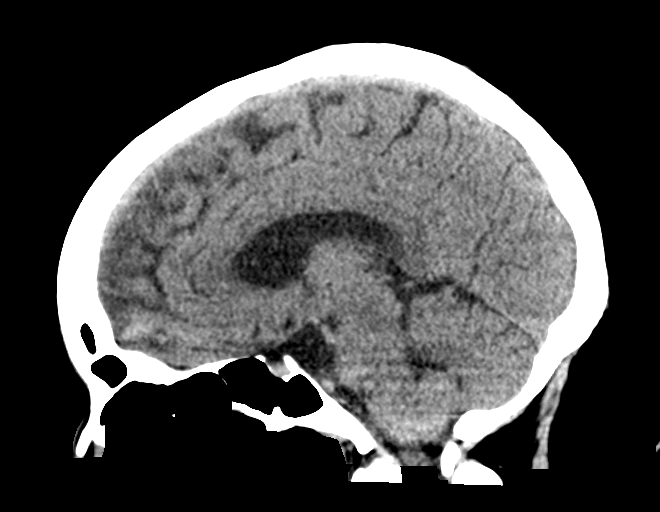
[im 37/55  brain]
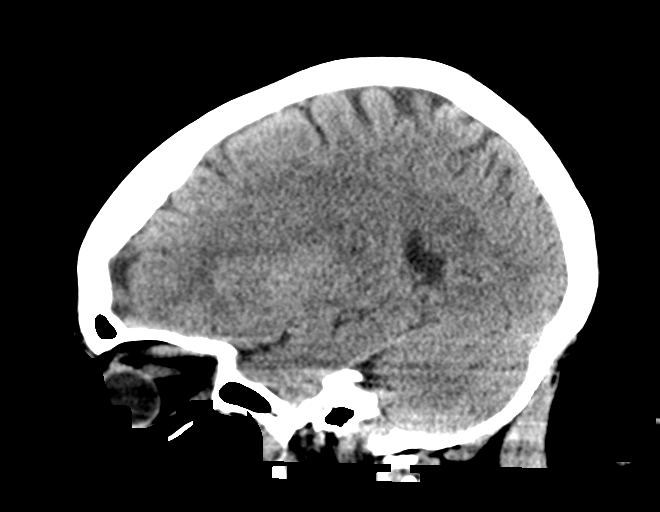

[16 of 47 positions shown; findings below may reference images not displayed]

FINDINGS: Brain: There is no mass, hemorrhage or extra-axial collection. The
size and configuration of the ventricles and extra-axial CSF spaces
are normal. The brain parenchyma is normal, without acute or chronic
infarction.

Vascular: No abnormal hyperdensity of the major intracranial
arteries or dural venous sinuses. No intracranial atherosclerosis.

Skull: The visualized skull base, calvarium and extracranial soft
tissues are normal.

Sinuses/Orbits: No fluid levels or advanced mucosal thickening of
the visualized paranasal sinuses. No mastoid or middle ear effusion.
The orbits are normal.
IMPRESSION: Normal head CT.

## 2023-04-20 ENCOUNTER — Inpatient Hospital Stay: Payer: 59 | Admitting: Hematology and Oncology

## 2023-04-20 ENCOUNTER — Telehealth: Payer: Self-pay | Admitting: Hematology and Oncology

## 2023-04-20 NOTE — Telephone Encounter (Signed)
Called and left VM for patient to call back to schedule an appointment.

## 2023-04-21 ENCOUNTER — Ambulatory Visit (INDEPENDENT_AMBULATORY_CARE_PROVIDER_SITE_OTHER): Payer: 59 | Admitting: Family Medicine

## 2023-04-21 ENCOUNTER — Encounter: Payer: Self-pay | Admitting: Family Medicine

## 2023-04-21 VITALS — BP 134/90 | HR 75 | Temp 98.8°F | Ht 61.42 in | Wt 188.8 lb

## 2023-04-21 DIAGNOSIS — D0512 Intraductal carcinoma in situ of left breast: Secondary | ICD-10-CM | POA: Diagnosis not present

## 2023-04-21 DIAGNOSIS — E559 Vitamin D deficiency, unspecified: Secondary | ICD-10-CM | POA: Diagnosis not present

## 2023-04-21 DIAGNOSIS — I10 Essential (primary) hypertension: Secondary | ICD-10-CM

## 2023-04-21 DIAGNOSIS — Z23 Encounter for immunization: Secondary | ICD-10-CM | POA: Diagnosis not present

## 2023-04-21 DIAGNOSIS — E876 Hypokalemia: Secondary | ICD-10-CM

## 2023-04-21 DIAGNOSIS — Z0000 Encounter for general adult medical examination without abnormal findings: Secondary | ICD-10-CM | POA: Diagnosis not present

## 2023-04-21 DIAGNOSIS — E782 Mixed hyperlipidemia: Secondary | ICD-10-CM | POA: Diagnosis not present

## 2023-04-21 DIAGNOSIS — R7303 Prediabetes: Secondary | ICD-10-CM | POA: Diagnosis not present

## 2023-04-21 DIAGNOSIS — Z96651 Presence of right artificial knee joint: Secondary | ICD-10-CM

## 2023-04-21 LAB — TSH: TSH: 0.73 u[IU]/mL (ref 0.35–5.50)

## 2023-04-21 LAB — CBC WITH DIFFERENTIAL/PLATELET
Basophils Absolute: 0.1 10*3/uL (ref 0.0–0.1)
Basophils Relative: 0.9 % (ref 0.0–3.0)
Eosinophils Absolute: 0.2 10*3/uL (ref 0.0–0.7)
Eosinophils Relative: 3.8 % (ref 0.0–5.0)
HCT: 38.9 % (ref 36.0–46.0)
Hemoglobin: 12.7 g/dL (ref 12.0–15.0)
Lymphocytes Relative: 36.1 % (ref 12.0–46.0)
Lymphs Abs: 2.1 10*3/uL (ref 0.7–4.0)
MCHC: 32.7 g/dL (ref 30.0–36.0)
MCV: 91.6 fL (ref 78.0–100.0)
Monocytes Absolute: 0.4 10*3/uL (ref 0.1–1.0)
Monocytes Relative: 7.4 % (ref 3.0–12.0)
Neutro Abs: 3 10*3/uL (ref 1.4–7.7)
Neutrophils Relative %: 51.8 % (ref 43.0–77.0)
Platelets: 358 10*3/uL (ref 150.0–400.0)
RBC: 4.24 Mil/uL (ref 3.87–5.11)
RDW: 14.1 % (ref 11.5–15.5)
WBC: 5.9 10*3/uL (ref 4.0–10.5)

## 2023-04-21 LAB — LIPID PANEL
Cholesterol: 211 mg/dL — ABNORMAL HIGH (ref 0–200)
HDL: 48 mg/dL (ref >39.00)
LDL Cholesterol: 115 mg/dL — ABNORMAL HIGH (ref 0–99)
NonHDL: 162.58
Total CHOL/HDL Ratio: 4
Triglycerides: 236 mg/dL — ABNORMAL HIGH (ref 0.0–149.0)
VLDL: 47.2 mg/dL — ABNORMAL HIGH (ref 0.0–40.0)

## 2023-04-21 LAB — T4, FREE: Free T4: 0.79 ng/dL (ref 0.60–1.60)

## 2023-04-21 LAB — COMPREHENSIVE METABOLIC PANEL
ALT: 25 U/L (ref 0–35)
AST: 23 U/L (ref 0–37)
Albumin: 4.3 g/dL (ref 3.5–5.2)
Alkaline Phosphatase: 58 U/L (ref 39–117)
BUN: 16 mg/dL (ref 6–23)
CO2: 28 meq/L (ref 19–32)
Calcium: 9.8 mg/dL (ref 8.4–10.5)
Chloride: 108 meq/L (ref 96–112)
Creatinine, Ser: 0.9 mg/dL (ref 0.40–1.20)
GFR: 65.19 mL/min (ref >60.00)
Glucose, Bld: 73 mg/dL (ref 70–99)
Potassium: 3.3 meq/L — ABNORMAL LOW (ref 3.5–5.1)
Sodium: 143 meq/L (ref 135–145)
Total Bilirubin: 0.2 mg/dL (ref 0.2–1.2)
Total Protein: 7.1 g/dL (ref 6.0–8.3)

## 2023-04-21 LAB — HEMOGLOBIN A1C: Hgb A1c MFr Bld: 6.1 % (ref 4.6–6.5)

## 2023-04-21 LAB — VITAMIN D 25 HYDROXY (VIT D DEFICIENCY, FRACTURES): VITD: 14.31 ng/mL — ABNORMAL LOW (ref 30.00–100.00)

## 2023-04-21 MED ORDER — HYDROCHLOROTHIAZIDE 12.5 MG PO TABS: 12.5000 mg | ORAL_TABLET | Freq: Every day | ORAL | 3 refills | Status: DC

## 2023-04-21 MED ORDER — LISINOPRIL 40 MG PO TABS: 40.0000 mg | ORAL_TABLET | Freq: Every day | ORAL | 3 refills | Status: DC

## 2023-04-21 NOTE — Progress Notes (Signed)
Established Patient Office Visit   Subjective  Patient ID: Rebecca [name], female    DOB: Jan 24, 1956  Age: 67 y.o. MRN: [id number]  Chief Complaint  Patient presents with   Annual Exam    Patient is a 67 year old female seen for CPE and follow-up on ongoing concerns.  Pt doing well.  Followed by Onc for L breast DCIS s/p lumpectomy, XRT, and tamoxifen.  Initially on anastrazole, but d/c'd d/t mood changes and increased hot flashes.  Pt had R knee revision 3 months ago.  Still having right knee pain more days than not.  Endorses mild improvement after surgery.  Patient denies any swelling or decreased range of motion.  Pt at times forgets to take bp meds.  Not checking bp at home, but has a cuff.  States bp has been elevated at chiropractor, does not recall readings.  Notes some increased stress as her husband is having seizures and taking care of his mother who has dementia.  Pt states she is managing the stress.    Patient Active Problem List   Diagnosis Date Noted   Failed total knee arthroplasty (HCC) 12/28/2022   Failed total right knee replacement (HCC) 12/28/2022   Genetic testing 04/01/2022   Ductal carcinoma in situ of breast 03/26/2022   OA (osteoarthritis) of knee 08/18/2021   S/P total knee arthroplasty, right 08/18/2021   Prediabetes 12/29/2019   Vitamin D deficiency 12/29/2019   Class 2 obesity due to excess calories with serious comorbidity and body mass index (BMI) of 38.0 to 38.9 in adult 08/31/2016   Hyperlipidemia 05/04/2016   Essential hypertension, benign 12/26/2012   Asthma 12/26/2012   OSA (obstructive sleep apnea) 10/05/2011   Past Medical History:  Diagnosis Date   Arthritis    Asthma    Blindness, legal    L EYE   Breast cancer (HCC)    GERD (gastroesophageal reflux disease)    Hearing loss    Hyperlipidemia    Hypertension    Lapband May 2013 11/18/2011   Migraines    Obesity, Class III, BMI 40-49.9 (morbid obesity) (HCC) 06/03/2011    Pre-diabetes    Sleep apnea    uses c-pap   Vertigo    Past Surgical History:  Procedure Laterality Date   ABDOMINAL HYSTERECTOMY  2006   fibroids   BREAST LUMPECTOMY WITH RADIOACTIVE SEED LOCALIZATION Left 04/21/2022   Procedure: LEFT BREAST LUMPECTOMY WITH RADIOACTIVE SEED LOCALIZATION;  Surgeon: Manus Rudd, MD;  Location: [facility];  Service: General;  Laterality: Left;   BREATH TEK H PYLORI  08/17/2011   Procedure: BREATH TEK H PYLORI;  Surgeon: Valarie Merino, MD;  Location: [facility];  Service: General;  Laterality: N/A;  PATIENT WILL COME AT 0715   COLONOSCOPY  2008   @ [facility]    EYE SURGERY     Patient unsure of surgery date. Left eye   KNEE SURGERY Right 1992   right knee arthroscopy   LAPAROSCOPIC GASTRIC BANDING  10/20/2011   Procedure: LAPAROSCOPIC GASTRIC BANDING;  Surgeon: Valarie Merino, MD;  Location: [facility];  Service: General;  Laterality: N/A;   RE-EXCISION OF BREAST LUMPECTOMY Left 05/05/2022   Procedure: RE-EXCISION OF LEFT BREAST LUMPECTOMY SITE POSTERIOR MARGIN;  Surgeon: Manus Rudd, MD;  Location: [facility];  Service: General;  Laterality: Left;   TOTAL KNEE ARTHROPLASTY Right 08/18/2021   Procedure: TOTAL KNEE ARTHROPLASTY;  Surgeon: Ollen Gross, MD;  Location: [facility];  Service: Orthopedics;  Laterality:  Right;   TOTAL KNEE REVISION Right 12/28/2022   Procedure: Right knee femoral versus total knee arthroplasty revision;  Surgeon: Ollen Gross, MD;  Location: [facility];  Service: Orthopedics;  Laterality: Right;   Social History   Tobacco Use   Smoking status: Former    Current packs/day: 0.00    Average packs/day: 0.5 packs/day for 15.0 years (7.5 ttl pk-yrs)    Types: Cigarettes    Start date: 06/03/1979    Quit date: 06/02/1994    Years since quitting: 28.9   Smokeless tobacco: Never  Vaping Use   Vaping status: Never Used  Substance Use Topics   Alcohol use: No   Drug use: No   Family History   Problem Relation Age of Onset   Breast cancer Mother 33   Alcohol abuse Father    Heart disease Father 31       MI   Hypertension Father    Colon cancer Brother        dx after 83   Breast cancer Daughter 50   Allergies  Allergen Reactions   Hydrocodone Hives, Shortness Of Breath and Itching   Sulfa Antibiotics Hives      ROS Negative unless stated above    Objective:     BP (!) 134/90 (BP Location: Left Arm, Patient Position: Sitting, Cuff Size: Large) Comment: patient didnt takebp meds this morning  Pulse 75   Temp 98.8 F (37.1 C) (Oral)   Ht 5' 1.42" (1.56 m)   Wt 188 lb 12.8 oz (85.6 kg)   SpO2 97%   BMI 35.19 kg/m  BP Readings from Last 3 Encounters:  04/21/23 (!) 134/90  12/31/22 131/63  12/22/22 (!) 159/87   Wt Readings from Last 3 Encounters:  04/21/23 188 lb 12.8 oz (85.6 kg)  12/28/22 195 lb 15.8 oz (88.9 kg)  12/22/22 196 lb (88.9 kg)      Physical Exam Constitutional:      Appearance: Normal appearance.  HENT:     Head: Normocephalic and atraumatic.     Right Ear: Tympanic membrane, ear canal and external ear normal.     Left Ear: Tympanic membrane, ear canal and external ear normal.     Nose: Nose normal.     Mouth/Throat:     Mouth: Mucous membranes are moist.     Pharynx: No oropharyngeal exudate or posterior oropharyngeal erythema.  Eyes:     General: No scleral icterus.    Extraocular Movements: Extraocular movements intact.     Conjunctiva/sclera: Conjunctivae normal.     Pupils: Pupils are equal, round, and reactive to light.  Neck:     Thyroid: No thyromegaly.  Cardiovascular:     Rate and Rhythm: Normal rate and regular rhythm.     Pulses: Normal pulses.     Heart sounds: Normal heart sounds. No murmur heard.    No friction rub.  Pulmonary:     Effort: Pulmonary effort is normal.     Breath sounds: Normal breath sounds. No wheezing, rhonchi or rales.  Abdominal:     General: Bowel sounds are normal.     Palpations:  Abdomen is soft.     Tenderness: There is no abdominal tenderness.  Musculoskeletal:        General: No deformity. Normal range of motion.  Lymphadenopathy:     Cervical: No cervical adenopathy.  Skin:    General: Skin is warm and dry.     Findings: No lesion.  Neurological:  General: No focal deficit present.     Mental Status: She is alert and oriented to person, place, and time.  Psychiatric:        Mood and Affect: Mood normal.        Thought Content: Thought content normal.     No results found for any visits on 04/21/23.    Assessment & Plan:  Well adult exam -Age-appropriate health screenings discussed. -Labs -Colonoscopy 08/06/2020 -Immunizations reviewed.  Influenza vaccine given at local pharmacy 04/08/23.  Pneumonia vaccine given this visit. -next CPE in 1 yr -     CBC with Differential/Platelet; Future  Essential hypertension -elevated -recheck -pt encouraged to set reminder in phone to help remember to take meds. -will increase lisinopril from 30 mg to 40 mg daily -continue hydrochlorothiazide 12.5 mg daily -check bp at home -     CBC with Differential/Platelet; Future -     Comprehensive metabolic panel; Future -     TSH; Future -     T4, free; Future -     Lisinopril; Take 1 tablet (40 mg total) by mouth daily.  Dispense: 90 tablet; Refill: 3 -     hydroCHLOROthiazide; Take 1 tablet (12.5 mg total) by mouth daily.  Dispense: 90 tablet; Refill: 3  Prediabetes -hgb A1C 5.9% on 07/10/21 -lifestyle mods -     Hemoglobin A1c; Future  Mixed hyperlipidemia -lifestyle modifications encouraged -     Comprehensive metabolic panel; Future -     Lipid panel; Future  Vitamin D deficiency -     VITAMIN D 25 Hydroxy (Vit-D Deficiency, Fractures); Future  Ductal carcinoma in situ (DCIS) of left breast -stage 0 L breast DCIS, ER+/PR+, dx'd Oct 2023 s/p lumpectomy, adjuvant XRT. -anastrozole caused irritability and increased hot flashes. -continue  tamoxifen -monitor for s/s of DVT/PE. -continue f/u with Oncology -     CBC with Differential/Platelet; Future  Need for pneumococcal vaccine -     Pneumococcal conjugate vaccine 20-valent  S/P revision of total knee, right - R TKR 08/18/21, s/o revision 12/28/22 -continued pain -advised to f/u with Ortho.  Consider 2nd opinion. -Consider pain management referral for non med options to treat pain -Also advised to consider water aerobics and chair exercises   Return in about 2 months (around 06/21/2023) for blood pressure.   [name], MD

## 2023-04-22 ENCOUNTER — Other Ambulatory Visit: Payer: Self-pay | Admitting: Family Medicine

## 2023-04-22 DIAGNOSIS — E876 Hypokalemia: Secondary | ICD-10-CM

## 2023-04-22 DIAGNOSIS — E559 Vitamin D deficiency, unspecified: Secondary | ICD-10-CM

## 2023-04-22 MED ORDER — VITAMIN D (ERGOCALCIFEROL) 1.25 MG (50000 UNIT) PO CAPS: 50000.0000 [IU] | ORAL_CAPSULE | ORAL | 0 refills | Status: DC

## 2023-04-22 MED ORDER — POTASSIUM CHLORIDE CRYS ER 20 MEQ PO TBCR: 20.0000 meq | EXTENDED_RELEASE_TABLET | Freq: Every day | ORAL | 3 refills | Status: DC

## 2023-05-04 ENCOUNTER — Inpatient Hospital Stay: Payer: 59 | Attending: Hematology and Oncology | Admitting: Hematology and Oncology

## 2023-06-02 ENCOUNTER — Ambulatory Visit (INDEPENDENT_AMBULATORY_CARE_PROVIDER_SITE_OTHER): Payer: Medicare Other | Admitting: Family

## 2023-06-02 ENCOUNTER — Ambulatory Visit
Admission: RE | Admit: 2023-06-02 | Discharge: 2023-06-02 | Disposition: A | Discharge status: Home / Self Care | Payer: 59 | Source: Ambulatory Visit | Attending: Family | Admitting: Family

## 2023-06-02 ENCOUNTER — Telehealth: Payer: Self-pay | Admitting: Family

## 2023-06-02 ENCOUNTER — Other Ambulatory Visit: Payer: Self-pay | Admitting: Family

## 2023-06-02 ENCOUNTER — Telehealth: Payer: Self-pay

## 2023-06-02 ENCOUNTER — Emergency Department (HOSPITAL_COMMUNITY)
Admission: EM | Admit: 2023-06-02 | Discharge: 2023-06-02 | Disposition: A | Discharge status: Home / Self Care | Payer: 59

## 2023-06-02 ENCOUNTER — Other Ambulatory Visit: Payer: Self-pay

## 2023-06-02 ENCOUNTER — Emergency Department (HOSPITAL_COMMUNITY): Payer: 59

## 2023-06-02 ENCOUNTER — Encounter: Payer: Self-pay | Admitting: Family

## 2023-06-02 VITALS — BP 150/80 | HR 86 | Temp 97.0°F | Resp 20 | Wt 192.0 lb

## 2023-06-02 DIAGNOSIS — R079 Chest pain, unspecified: Secondary | ICD-10-CM | POA: Insufficient documentation

## 2023-06-02 DIAGNOSIS — R051 Acute cough: Secondary | ICD-10-CM

## 2023-06-02 DIAGNOSIS — Z20822 Contact with and (suspected) exposure to covid-19: Secondary | ICD-10-CM | POA: Insufficient documentation

## 2023-06-02 DIAGNOSIS — J4540 Moderate persistent asthma, uncomplicated: Secondary | ICD-10-CM

## 2023-06-02 DIAGNOSIS — R0602 Shortness of breath: Secondary | ICD-10-CM | POA: Diagnosis present

## 2023-06-02 DIAGNOSIS — R918 Other nonspecific abnormal finding of lung field: Secondary | ICD-10-CM

## 2023-06-02 DIAGNOSIS — R059 Cough, unspecified: Secondary | ICD-10-CM | POA: Diagnosis not present

## 2023-06-02 DIAGNOSIS — J310 Chronic rhinitis: Secondary | ICD-10-CM

## 2023-06-02 LAB — CBC WITH DIFFERENTIAL/PLATELET
Abs Immature Granulocytes: 0.02 10*3/uL (ref 0.00–0.07)
Basophils Absolute: 0.1 10*3/uL (ref 0.0–0.1)
Basophils Relative: 1 %
Eosinophils Absolute: 0.4 10*3/uL (ref 0.0–0.5)
Eosinophils Relative: 5 %
HCT: 35.7 % — ABNORMAL LOW (ref 36.0–46.0)
Hemoglobin: 11.5 g/dL — ABNORMAL LOW (ref 12.0–15.0)
Immature Granulocytes: 0 %
Lymphocytes Relative: 28 %
Lymphs Abs: 2.3 10*3/uL (ref 0.7–4.0)
MCH: 30.6 pg (ref 26.0–34.0)
MCHC: 32.2 g/dL (ref 30.0–36.0)
MCV: 94.9 fL (ref 80.0–100.0)
Monocytes Absolute: 0.6 10*3/uL (ref 0.1–1.0)
Monocytes Relative: 7 %
Neutro Abs: 4.8 10*3/uL (ref 1.7–7.7)
Neutrophils Relative %: 59 %
Platelets: 344 10*3/uL (ref 150–400)
RBC: 3.76 MIL/uL — ABNORMAL LOW (ref 3.87–5.11)
RDW: 13.1 % (ref 11.5–15.5)
WBC: 8.2 10*3/uL (ref 4.0–10.5)
nRBC: 0 % (ref 0.0–0.2)

## 2023-06-02 LAB — COMPREHENSIVE METABOLIC PANEL
ALT: 23 U/L (ref 0–44)
AST: 26 U/L (ref 15–41)
Albumin: 4.3 g/dL (ref 3.5–5.0)
Alkaline Phosphatase: 55 U/L (ref 38–126)
Anion gap: 10 (ref 5–15)
BUN: 16 mg/dL (ref 8–23)
CO2: 23 mmol/L (ref 22–32)
Calcium: 8.9 mg/dL (ref 8.9–10.3)
Chloride: 107 mmol/L (ref 98–111)
Creatinine, Ser: 0.96 mg/dL (ref 0.44–1.00)
GFR, Estimated: >60 mL/min (ref >60)
Glucose, Bld: 116 mg/dL — ABNORMAL HIGH (ref 70–99)
Potassium: 3.7 mmol/L (ref 3.5–5.1)
Sodium: 140 mmol/L (ref 135–145)
Total Bilirubin: 0.4 mg/dL (ref <1.2)
Total Protein: 7 g/dL (ref 6.5–8.1)

## 2023-06-02 LAB — D-DIMER, QUANTITATIVE: D-DIMER: 2.05 mg{FEU}/L — ABNORMAL HIGH (ref 0.00–0.49)

## 2023-06-02 LAB — RESP PANEL BY RT-PCR (RSV, FLU A&B, COVID)  RVPGX2
Influenza A by PCR: NEGATIVE
Influenza B by PCR: NEGATIVE
Resp Syncytial Virus by PCR: NEGATIVE
SARS Coronavirus 2 by RT PCR: NEGATIVE

## 2023-06-02 LAB — TROPONIN I (HIGH SENSITIVITY)
Troponin I (High Sensitivity): 4 ng/L (ref <18)
Troponin I (High Sensitivity): 4 ng/L

## 2023-06-02 MED ORDER — ALVESCO 160 MCG/ACT IN AERS: 1.0000 | INHALATION_SPRAY | Freq: Two times a day (BID) | RESPIRATORY_TRACT | 2 refills | Status: DC

## 2023-06-02 MED ORDER — PREDNISONE 10 MG PO TABS: ORAL_TABLET | ORAL | 0 refills | Status: DC

## 2023-06-02 MED ORDER — RYALTRIS 665-25 MCG/ACT NA SUSP: NASAL | 5 refills | Status: DC

## 2023-06-02 MED ORDER — LEVALBUTEROL TARTRATE 45 MCG/ACT IN AERO
4.0000 | INHALATION_SPRAY | Freq: Once | RESPIRATORY_TRACT | Status: AC
  Administered 2023-06-02: 4 via RESPIRATORY_TRACT

## 2023-06-02 MED ORDER — MONTELUKAST SODIUM 10 MG PO TABS: 10.0000 mg | ORAL_TABLET | Freq: Every day | ORAL | 5 refills | Status: DC

## 2023-06-02 MED ORDER — ALBUTEROL SULFATE HFA 108 (90 BASE) MCG/ACT IN AERS: INHALATION_SPRAY | RESPIRATORY_TRACT | 1 refills | Status: AC

## 2023-06-02 MED ORDER — IOHEXOL 350 MG/ML SOLN
80.0000 mL | Freq: Once | INTRAVENOUS | Status: AC | PRN
  Administered 2023-06-02: 80 mL via INTRAVENOUS

## 2023-06-02 NOTE — Telephone Encounter (Signed)
-----   Message from Nehemiah Settle sent at 06/02/2023  2:37 PM EST ----- Please let Rebecca [name] know that her chest x-ray shows some densities over her peripheral right upper lobe. The radiologist recommends a CT chest without contrast to further evaluate. Please order a CT chest without contrast. Diagnosis abnormal lung imagi ng.   Also, please let her know that I have not received her STAT d-dimer results back yet,but will contact her when I do.

## 2023-06-02 NOTE — ED Notes (Signed)
Pt brought back to Res A from main lobby via wheelchair. Ambulatory without assistance from wheelchair to stretcher. Pt denies chest pain. Reports ongoing cough that feels congested but has been unable to cough anything up. On assessment pt tachypnea RR mid 20s on room air. Pt connected to monitor. Call bell within reach. Informed of POC for pending CT.

## 2023-06-02 NOTE — Telephone Encounter (Signed)
Provider is aware of xray results.

## 2023-06-02 NOTE — Telephone Encounter (Signed)
Provider has spoken to patient already .

## 2023-06-02 NOTE — Telephone Encounter (Signed)
Thank you Marcelino Duster for your help

## 2023-06-02 NOTE — ED Notes (Signed)
Blue top sent. 

## 2023-06-02 NOTE — Telephone Encounter (Signed)
Per Chrissie:  Sent patient a myChart message as well advising her to go to nearest ER for further evaluation for possible blood clot due to her D-Dimer being elevated - 2.05.   Tried calling patient again - DOB verified - advised her of above provider notation.  I told patient I would contact [facility] to advise of patient coming.  Patient verbalized understanding to all, no further question.  Called [facility] - spoke to [employer], Press photographer - DOB verified - advised of above notation.  Sheria Lang verbalized understanding, no questions.  Forwarding updated message to provider.

## 2023-06-02 NOTE — Discharge Instructions (Addendum)
Please follow-up with pulmonology and your primary doctors.  Return immediately felt fevers, chills, worsening chest pain, worsening shortness of breath or he develop any new or worsening symptoms that are concerning to you.

## 2023-06-02 NOTE — Telephone Encounter (Signed)
Rubu called from [facility] requested a call back about X-Ray results and number is [phone].

## 2023-06-02 NOTE — Progress Notes (Signed)
Please let Rebecca [name] know that her chest x-ray shows some densities over her peripheral right upper lobe. The radiologist recommends a CT chest without contrast to further evaluate. Please order a CT chest without contrast. Diagnosis abnormal lung imaging.   Also, please let her know that I have not received her STAT d-dimer results back yet,but will contact her when I do.

## 2023-06-02 NOTE — Telephone Encounter (Signed)
Attempted to call Rebecca [name] due to her elevated d-dimer results (2.05 mg/ L). Message left on her cell phone for her to call me back. Also, attempted to contact her spouse, [name], on his cell phone,but unable to leave message.  Please try  to get in touch with her  via My Chart and from our office phone and let her know that she needs to go to the Emergency Room due to the elevated d-dimer and risk for blood clot.  Nehemiah Settle, FNP

## 2023-06-02 NOTE — Patient Instructions (Addendum)
Asthma-not well controlled -STAT d-dimer and STAT chest-xray ordered. We will call you with results once they are back.  - Start prednisone 10 mg taking 1 tablet twice a day for 5 days and then stop - re-start Singulair 10mg  daily.Patient cautioned that rarely some children/adults can experience behavioral changes after beginning montelukast. These side effects are rare, however, if you notice any change, notify the clinic and discontinue montelukast. - re-start Alvesco 2 puffs twice a day with spacer device. This is maintenance asthma inhaler to be taken daily - have access to Albuterol inhaler 2 puffs every 4-6 hours as needed for cough, wheeze, shortness of breath, chest tightness.   Asthma control goals:  Full participation in all desired activities (may need albuterol before activity) Albuterol use two time or less a week on average (not counting use with activity) Cough interfering with sleep two time or less a month Oral steroids no more than once a year No hospitalizations  Mixed rhinitis - re-start Ryaltris 2 sprays each nostril twice a day at this time.  This is a combination nasal spray with nasal steroid (for congestion control) and nasal antihistamine (for drainage control).   - May use Zyrtec 10mg  daily as needed for runny nose - Saline nasal wash each evening in the shower and as needed.  Let us know if you are not getting better or develop a fever.  Follow-up in 6-8 weeks or sooner if needed.

## 2023-06-02 NOTE — ED Notes (Signed)
AVS provided by edp was reinforced to pt. Pt verbalized understanding of discharge instructions with no additional questions at this time. Pt ambulatory to car without assistance.

## 2023-06-02 NOTE — ED Triage Notes (Signed)
Pt arrives with reports of possible PE per dr. Rock Nephew reports getting xray. Pt reports persistent cough and chest pain for the last 5 days.

## 2023-06-02 NOTE — ED Provider Triage Note (Signed)
Emergency Medicine Provider Triage Evaluation Note  Rebecca [name] , a 67 y.o. female  was evaluated in triage.  Pt complains of chest pain and SOB x 1 day. Had ddimer done today and was told to come to ER.   Review of Systems  Positive: Chest pain, SOB Negative: fevers  Physical Exam  BP (!) 170/82   Pulse 91   Temp 98.7 F (37.1 C)   Resp 20   SpO2 98%  Gen:   Awake, no distress   Resp:  Normal effort  MSK:   Moves extremities without difficulty  Other:  +coarse breath sounds in RLL  Medical Decision Making  Medically screening exam initiated at 7:17 PM.  Appropriate orders placed.  Rebecca [name] was informed that the remainder of the evaluation will be completed by another provider, this initial triage assessment does not replace that evaluation, and the importance of remaining in the ED until their evaluation is complete.    Rebecca [name], [place] 06/02/23 [phone]

## 2023-06-02 NOTE — Telephone Encounter (Signed)
Left message for Rebecca [name] to call the office. I discussed her symptoms and past medical history with Dr. Selena Batten and I would like for her to get a STAT chest x-ray and STAT d-dimer due to her cough and wheeze.  Nehemiah Settle, FNP

## 2023-06-02 NOTE — Progress Notes (Signed)
[address] [place] [address] Dept: [phone]  FOLLOW UP NOTE  Patient ID: Rebecca [name], female    DOB: Nov 14, 1955  Age: 67 y.o. MRN: [id number] Date of Office Visit: 06/02/2023  Assessment  Chief Complaint: Asthma (Flare)  HPI Rebecca [name] is a 67 year old female who presents today for an acute visit of asthma flareup.  She was last seen on August 01, 2021 by Dr. Delorse Lek.  Since her last office visit she had a right total knee replacement on December 27, 2021.  Then on March 17, 2022 she had a left breast mass found on mammogram and was diagnosed with ductal carcinoma in situ of the left breast.  April 21, 2022 she had a lumpectomy of the left breast.  May 05, 2022 she had surgery for reexcision to clear margin.  May 17, 2022 through June 26, 2022 she had radiation therapy.  February 2024 she was started on anastrozole and then switched to tamoxifen.  Asthma: She reports that she started Sunday with coughing that was not that bad.  On Monday the coughing was worse.  She reports that she feels phlegm in her lungs that she is trying to spit up.  What she is coughing up is creamy in color.  She also reports a little bit of wheezing.  She denies fever, chills, tightness in chest, shortness of breath.  Her body aches are no more than normal.  She also denies any known sick contacts, chest pain and palpitations.  Her cough does wake her up at night.  Since her last office visit she has not required any systemic steroids or made any trips to the emergency room urgent care due to breathing problems.  She has tried taking Robitussin DM and it has not helped.  She reports that she is out of albuterol and is also out of montelukast 10 mg daily and Alvesco 160 mcg 2 puffs twice a day with spacer..  She denies any lower extremity swelling, leg pain, warmth, tenderness, or or discoloration of her lower extremities.  She does mention that she will have cramps in both of her legs off  and on when she sleeps at night.  Allergic rhinitis: She reports a little bit of clear rhinorrhea and denies nasal congestion and postnasal drip.  She has not been treated for any sinus infections since we last saw her.  She also reports that her head hurts, but she attributes that to be due to the coughing.  She is currently out of all of her allergy medicines and not taking anything for her allergies.  She denies any heartburn or reflux symptoms.  When she returned to get lab work for d-dimer she reports that she  has been having pain in her chest that she feels may be due to coughing.   Drug Allergies:  Allergies  Allergen Reactions   Hydrocodone Hives, Shortness Of Breath and Itching   Sulfa Antibiotics Hives    Review of Systems: Negative except as per HPI   Physical Exam: BP (!) 150/80   Pulse 86   Temp (!) 97 F (36.1 C) (Temporal)   Resp 20   Wt 192 lb (87.1 kg)   SpO2 96%   BMI 35.79 kg/m    Physical Exam Constitutional:      Appearance: Normal appearance.  HENT:     Head: Normocephalic and atraumatic.     Comments: Pharynx normal, eyes normal, ears normal, nose normal    Right Ear: Tympanic  membrane, ear canal and external ear normal.     Left Ear: Tympanic membrane, ear canal and external ear normal.     Nose: Nose normal.     Mouth/Throat:     Mouth: Mucous membranes are moist.     Pharynx: Oropharynx is clear.  Eyes:     Conjunctiva/sclera: Conjunctivae normal.  Cardiovascular:     Rate and Rhythm: Regular rhythm.     Heart sounds: Normal heart sounds.  Pulmonary:     Effort: Pulmonary effort is normal.     Breath sounds: Normal breath sounds.     Comments: Lungs clear to auscultation Musculoskeletal:     Cervical back: Neck supple.  Skin:    General: Skin is warm.  Neurological:     Mental Status: She is alert and oriented to person, place, and time.  Psychiatric:        Mood and Affect: Mood normal.        Behavior: Behavior normal.         Thought Content: Thought content normal.        Judgment: Judgment normal.     Diagnostics: FVC 1.80 L (81%), FEV1 1.36 L (78%), FEV1/FVC 0.76.  Spirometry indicates normal spirometry.  4 puffs of Xopenex given.  Postbronchodilator response shows FVC 1.83 L (83%), FEV1 1.39 L (80%).  There is a 2% change in FEV1.  Spirometry indicates normal spirometry.  Assessment and Plan: 1. Not well controlled moderate persistent asthma   2. Mixed rhinitis   3. Acute cough     Meds ordered this encounter  Medications   levalbuterol (XOPENEX HFA) inhaler 4 puff   ALVESCO 160 MCG/ACT inhaler    Sig: Inhale 1 puff into the lungs 2 (two) times daily.    Dispense:  1 each    Refill:  2   albuterol (VENTOLIN HFA) 108 (90 Base) MCG/ACT inhaler    Sig: Use 2 puffs every 4 hours as needed for cough or wheeze.  May use  2 puffs 10-20 minutes prior to exercise.    Dispense:  18 g    Refill:  1   montelukast (SINGULAIR) 10 MG tablet    Sig: Take 1 tablet (10 mg total) by mouth at bedtime.    Dispense:  30 tablet    Refill:  5   Olopatadine-Mometasone (RYALTRIS) 665-25 MCG/ACT SUSP    Sig: Place 2 sprays in each nostril twice a day as needed for stuffy nose/runny nose    Dispense:  29 g    Refill:  5   predniSONE (DELTASONE) 10 MG tablet    Sig: Take one tablet twice a day for 5 days    Dispense:  10 tablet    Refill:  0    Patient Instructions  Asthma-not well controlled -STAT d-dimer and STAT chest-xray ordered. We will call you with results once they are back.  - Start prednisone 10 mg taking 1 tablet twice a day for 5 days and then stop - re-start Singulair 10mg  daily.Patient cautioned that rarely some children/adults can experience behavioral changes after beginning montelukast. These side effects are rare, however, if you notice any change, notify the clinic and discontinue montelukast. - re-start Alvesco 2 puffs twice a day with spacer device. This is maintenance asthma inhaler to be  taken daily - have access to Albuterol inhaler 2 puffs every 4-6 hours as needed for cough, wheeze, shortness of breath, chest tightness.   Asthma control goals:  Full participation in all  desired activities (may need albuterol before activity) Albuterol use two time or less a week on average (not counting use with activity) Cough interfering with sleep two time or less a month Oral steroids no more than once a year No hospitalizations  Mixed rhinitis - re-start Ryaltris 2 sprays each nostril twice a day at this time.  This is a combination nasal spray with nasal steroid (for congestion control) and nasal antihistamine (for drainage control).   - May use Zyrtec 10mg  daily as needed for runny nose - Saline nasal wash each evening in the shower and as needed.  Let us know if you are not getting better or develop a fever.  Follow-up in 6-8 weeks or sooner if needed.   Return in about 6 weeks (around 07/14/2023), or if symptoms worsen or fail to improve.    Thank you for the opportunity to care for this patient.  Please do not hesitate to contact me with questions.  Nehemiah Settle, FNP [facility]

## 2023-06-02 NOTE — ED Provider Notes (Signed)
[place] EMERGENCY DEPARTMENT AT [facility] Provider Note   CSN: [id number] Arrival date & time: 06/02/23  1828     History  Chief Complaint  Patient presents with   Cough   Chest Pain    Rebecca [name] is a 67 y.o. female.  67 year old female presenting to the emergency department for chest pain and shortness of breath.  Reports feeling unwell since Sunday.  Has had worsening shortness of breath since that time.  Also notes some intermittent shortness of breath.  Seemingly worse today.  States she feels this way sometimes when she is sick.  She had outpatient labs done which showed an elevated D-dimer, also had a chest x-ray and was told to go to the emergency department to be evaluated for pulmonary embolism.   Cough Associated symptoms: chest pain   Chest Pain Associated symptoms: cough   .  No leukocytosis.  Mild anemia.  She does not have     Home Medications Prior to Admission medications   Medication Sig Start Date End Date Taking? Authorizing Provider  albuterol (PROVENTIL) (2.5 MG/3ML) 0.083% nebulizer solution Take 3 mLs (2.5 mg total) by nebulization every 4 (four) hours as needed for wheezing or shortness of breath. 11/15/19   Marcelyn Bruins, MD  albuterol (VENTOLIN HFA) 108 (90 Base) MCG/ACT inhaler Use 2 puffs every 4 hours as needed for cough or wheeze.  May use  2 puffs 10-20 minutes prior to exercise. 06/02/23   Nehemiah Settle, FNP  ALVESCO 160 MCG/ACT inhaler Inhale 1 puff into the lungs 2 (two) times daily. 06/02/23   Nehemiah Settle, FNP  hydrochlorothiazide (HYDRODIURIL) 12.5 MG tablet Take 1 tablet (12.5 mg total) by mouth daily. 04/21/23   [name], MD  ibuprofen (ADVIL) 800 MG tablet Take 800 mg by mouth 2 (two) times daily as needed. 04/06/23   [provider]  lisinopril (ZESTRIL) 40 MG tablet Take 1 tablet (40 mg total) by mouth daily. Patient not taking: Reported on 06/02/2023 04/21/23   [name], MD   montelukast (SINGULAIR) 10 MG tablet Take 1 tablet (10 mg total) by mouth at bedtime. 06/02/23   Nehemiah Settle, FNP  Olopatadine-Mometasone [employer]) [phone] MCG/ACT SUSP Place 2 sprays in each nostril twice a day as needed for stuffy nose/runny nose 06/02/23   Nehemiah Settle, FNP  potassium chloride SA (KLOR-CON M) 20 MEQ tablet Take 1 tablet (20 mEq total) by mouth daily. Patient not taking: Reported on 06/02/2023 04/22/23   [name], MD  predniSONE (DELTASONE) 10 MG tablet Take one tablet twice a day for 5 days 06/02/23   Nehemiah Settle, FNP  tamoxifen (NOLVADEX) 20 MG tablet Take 20 mg by mouth daily.    [provider]  Vitamin D, Ergocalciferol, (DRISDOL) 1.25 MG (50000 UNIT) CAPS capsule Take 1 capsule (50,000 Units total) by mouth every 7 (seven) days. 04/22/23   [name], MD      Allergies    Hydrocodone and Sulfa antibiotics    Review of Systems   Review of Systems  Respiratory:  Positive for cough.   Cardiovascular:  Positive for chest pain.    Physical Exam Updated Vital Signs BP (!) 144/83 (BP Location: Right Arm)   Pulse 88   Temp 98.2 F (36.8 C) (Oral)   Resp (!) 22   SpO2 94%  Physical Exam Vitals and nursing note reviewed.  Constitutional:      General: She is not in acute distress.  Appearance: She is not toxic-appearing.  HENT:     Head: Normocephalic.  Cardiovascular:     Rate and Rhythm: Normal rate and regular rhythm.     Heart sounds: Normal heart sounds.  Pulmonary:     Breath sounds: Normal breath sounds. No wheezing, rhonchi or rales.  Musculoskeletal:     Right lower leg: No edema.     Left lower leg: No edema.  Skin:    General: Skin is warm.     Capillary Refill: Capillary refill takes less than 2 seconds.  Neurological:     Mental Status: She is alert.  Psychiatric:        Mood and Affect: Mood normal.        Behavior: Behavior normal.     ED Results / Procedures / Treatments   Labs (all labs ordered  are listed, but only abnormal results are displayed) Labs Reviewed  CBC WITH DIFFERENTIAL/PLATELET - Abnormal; Notable for the following components:      Result Value   RBC 3.76 (*)    Hemoglobin 11.5 (*)    HCT 35.7 (*)    All other components within normal limits  COMPREHENSIVE METABOLIC PANEL - Abnormal; Notable for the following components:   Glucose, Bld 116 (*)    All other components within normal limits  RESP PANEL BY RT-PCR (RSV, FLU A&B, COVID)  RVPGX2  TROPONIN I (HIGH SENSITIVITY)  TROPONIN I (HIGH SENSITIVITY)    EKG None  Radiology CT Angio Chest PE W and/or Wo Contrast Result Date: 06/02/2023 CLINICAL DATA:  Possible PE. Persistent cough and chest pain for 5 days. Positive D-dimer. EXAM: CT ANGIOGRAPHY CHEST WITH CONTRAST TECHNIQUE: Multidetector CT imaging of the chest was performed using the standard protocol during bolus administration of intravenous contrast. Multiplanar CT image reconstructions and MIPs were obtained to evaluate the vascular anatomy. RADIATION DOSE REDUCTION: This exam was performed according to the departmental dose-optimization program which includes automated exposure control, adjustment of the mA and/or kV according to patient size and/or use of iterative reconstruction technique. CONTRAST:  80mL OMNIPAQUE IOHEXOL 350 MG/ML SOLN COMPARISON:  Radiograph 06/02/2023 FINDINGS: Cardiovascular: Negative for acute pulmonary embolism. No pericardial effusion. Coronary artery and aortic atherosclerotic calcification Mediastinum/Nodes: Trachea and esophagus are unremarkable. No adenopathy. Lungs/Pleura: Subpleural reticular and ground-glass opacities suggesting interstitial lung disease. Benign cyst in the right lower lobe. No focal consolidation, pleural effusion, or pneumothorax. Upper Abdomen: No acute abnormality. Musculoskeletal: Advanced arthritis right shoulder. No acute fracture. Multilevel bridging anterior osteophytes in the thoracic spine. Review of the  MIP images confirms the above findings. IMPRESSION: 1. Negative for acute pulmonary embolism. 2. Subpleural reticular and ground-glass opacities suggesting early/mild interstitial lung disease. Aortic Atherosclerosis (ICD10-I70.0). Electronically Signed   By: Minerva Fester M.D.   On: 06/02/2023 21:51   DG Chest 2 View Result Date: 06/02/2023 CLINICAL DATA:  Chest pain and cough. EXAM: CHEST - 2 VIEW COMPARISON:  02/04/2022 FINDINGS: The lungs are clear without focal pneumonia, edema, pneumothorax or pleural effusion. Subtle nodular densities project over the peripheral right upper lobe. The cardiopericardial silhouette is within normal limits for size. No acute bony abnormality. IMPRESSION: 1. No acute cardiopulmonary findings. 2. Subtle nodular densities project over the peripheral right upper lobe. CT chest without contrast recommended to further evaluate. These results will be called to the ordering clinician or representative by the Radiologist Assistant, and communication documented in the PACS or [employer]. Electronically Signed   By: Kennith Center M.D.   On: 06/02/2023  14:16    Procedures Procedures    Medications Ordered in ED Medications  iohexol (OMNIPAQUE) 350 MG/ML injection 80 mL (80 mLs Intravenous Contrast Given 06/02/23 2027)    ED Course/ Medical Decision Making/ A&P Clinical Course as of 06/02/23 2337  Wed Jun 02, 2023  2210 CT Angio Chest PE W and/or Wo Contrast IMPRESSION: 1. Negative for acute pulmonary embolism. 2. Subpleural reticular and ground-glass opacities suggesting early/mild interstitial lung disease.   [TY]  2216 Patient not currently having chest pain no shortness of breath.  Labs otherwise reassuring.  ACS unlikely negative troponin.  EKG with no ST segment changes to indicate ischemia.  Clinically without evidence of DVT will discharge in stable condition [TY]    Clinical Course User Index [TY] Coral Spikes, DO                                  Medical Decision Making 67 year old female presenting emergency department for concern for PE.  Has a history of asthma.  She is afebrile nontachycardic maintaining her oxygen saturation on room air.  Does not appear to be in respiratory distress.  Per chart review had elevated D-dimer outpatient by her allergist.  She also notes some chest pain today.  EKG reassuring on my independent interpretation.  No ST segment changes to indicate ischemia.  Will get troponin as well.  CTA ordered given patient's elevated D-dimer.  See ED course for further MDM disposition.  Amount and/or Complexity of Data Reviewed External Data Reviewed:     Details: Per chart review has messaging from allergy clinic with D-dimer of 2.05. Labs:  Decision-making details documented in ED Course. Radiology:  Decision-making details documented in ED Course. ECG/medicine tests:  Decision-making details documented in ED Course.          Final Clinical Impression(s) / ED Diagnoses Final diagnoses:  Shortness of breath    Rx / DC Orders ED Discharge Orders     None         Coral Spikes, DO 06/02/23 2337

## 2023-06-02 NOTE — Telephone Encounter (Signed)
Called patient's insurance - [employer]/([phone]) [phone] - 3210 - spoke to [facility] H./CSR - DOB verified - stated no PA needed for CT Chest w/o contrast.   CT Chest w/o contrast CPT code: 18841 ICD 10 code: R91.8 Ref#: [id number]   Called [phone] - spoke to Angie - DOB verified  - scheduled STAT CT Chest w/o contrast:   Date: 06/03/23 Time: 1:00 pm Arrival: 12:45 pm Site: [facility] [employer]: [address] [place], [place] [address] Restrictions: NONE   Called patient - DOB verified - advised of above and provider notation.  Patient verbalized understanding to all, no further questions.  Forwarding updated message to provider.

## 2023-06-02 NOTE — Telephone Encounter (Signed)
Thanks Marcelino Duster for all your hard work!!

## 2023-06-03 ENCOUNTER — Other Ambulatory Visit: Payer: 59

## 2023-06-03 ENCOUNTER — Telehealth: Payer: Self-pay | Admitting: Family

## 2023-06-03 NOTE — Telephone Encounter (Signed)
Attempted to contact Rebecca [name] to let her know to not go to her scheduled CT chest without contrast today at 1 pm due to her recent CT Chest PE protocol being done yesterday evening. Left a voicemail for her to call me back.  Please make sure she gets referred to pulmonology for : Subpleural reticular and ground-glass opacities suggesting early/mild interstitial lung disease.   The ER may have already done this referral,but I want to make sure she sees pulmonology.  Nehemiah Settle, FNP

## 2023-06-03 NOTE — Telephone Encounter (Signed)
Patient called back and states that she did go to the ER and they did not see a blood clot, she states that she has an appointment with Pulmonology in the morning.

## 2023-06-03 NOTE — Progress Notes (Signed)
Results given to patient and she went to the ER last night

## 2023-06-03 NOTE — Telephone Encounter (Signed)
Noted. Thank you Ashleigh. I spoke with her also and she reports that she figured that she would not need to go to her appointment for CT chest without contrast today.

## 2023-06-04 ENCOUNTER — Other Ambulatory Visit: Payer: Self-pay

## 2023-06-04 ENCOUNTER — Encounter: Payer: Self-pay | Admitting: Adult Health

## 2023-06-04 ENCOUNTER — Ambulatory Visit: Payer: 59 | Admitting: Adult Health

## 2023-06-04 ENCOUNTER — Telehealth: Payer: Self-pay | Admitting: Adult Health

## 2023-06-04 VITALS — BP 110/90 | HR 90 | Temp 98.4°F | Ht 61.0 in | Wt 187.2 lb

## 2023-06-04 DIAGNOSIS — R051 Acute cough: Secondary | ICD-10-CM | POA: Diagnosis not present

## 2023-06-04 DIAGNOSIS — R9389 Abnormal findings on diagnostic imaging of other specified body structures: Secondary | ICD-10-CM | POA: Diagnosis not present

## 2023-06-04 DIAGNOSIS — J4541 Moderate persistent asthma with (acute) exacerbation: Secondary | ICD-10-CM | POA: Diagnosis not present

## 2023-06-04 MED ORDER — PREDNISONE 10 MG PO TABS: ORAL_TABLET | ORAL | 0 refills | Status: DC

## 2023-06-04 MED ORDER — METHYLPREDNISOLONE ACETATE 80 MG/ML IJ SUSP
80.0000 mg | Freq: Once | INTRAMUSCULAR | Status: AC
  Administered 2023-06-04: 80 mg via INTRAMUSCULAR

## 2023-06-04 MED ORDER — ALBUTEROL SULFATE (2.5 MG/3ML) 0.083% IN NEBU: 2.5000 mg | INHALATION_SOLUTION | RESPIRATORY_TRACT | 1 refills | Status: AC | PRN

## 2023-06-04 MED ORDER — BENZONATATE 200 MG PO CAPS: 200.0000 mg | ORAL_CAPSULE | Freq: Three times a day (TID) | ORAL | 1 refills | Status: DC | PRN

## 2023-06-04 MED ORDER — AZITHROMYCIN 250 MG PO TABS: ORAL_TABLET | ORAL | 0 refills | Status: AC

## 2023-06-04 MED ORDER — ALBUTEROL SULFATE (2.5 MG/3ML) 0.083% IN NEBU
2.5000 mg | INHALATION_SOLUTION | Freq: Once | RESPIRATORY_TRACT | Status: AC
  Administered 2023-06-04: 2.5 mg via RESPIRATORY_TRACT

## 2023-06-04 NOTE — Assessment & Plan Note (Signed)
Slow to resolve asthmatic bronchitic exacerbation-albuterol nebulizer treatment given in the office today.  Patient will begin Trelegy inhaler-1 sample was given.  Will take 1 puff daily.  And then once sample is completed we will begin Alvesco inhaler as recommended.  Will give a Z-Pak and prednisone taper.  She was given a Depo-Medrol 80 mg IM injection in the office today.  Will begin prednisone burst tomorrow.  She is to discontinue previous steroid package.  Use Mucinex DM and Tessalon Perles for cough control.  Follow-up in 6 to 8 weeks with PFTs.  Plan  Patient Instructions  Begin Trelegy 1 puff daily until sample is gone then begin Alvesco inhaler, rinse after use.  Albuterol inhaler or neb As needed   Liquid Mucinex DM Twice daily  As needed  cough/congestion  Prednisone taper over next week-begin tomorrow.  Zpack take as directed.  Tessalon perles Three times a day  As needed  cough   Discuss with PCP that Lisinopril can aggravate cough.   Continue on CPAP At bedtime  Keep up good work  Work on [employer]   Follow up with Dr. Marchelle Gearing in 6-8 weeks with PFT -30 min slot  Please contact office for sooner follow up if symptoms do not improve or worsen or seek emergency care

## 2023-06-04 NOTE — Telephone Encounter (Signed)
Patient was seen in office today and needs to be prescribed prednisone. Pharmacy did not have the order.   Patient also doesn't have medicine that goes in her nebulizer.   Pharmacy: [facility] [address]

## 2023-06-04 NOTE — Patient Instructions (Addendum)
Begin Trelegy 1 puff daily until sample is gone then begin Alvesco inhaler, rinse after use.  Albuterol inhaler or neb As needed   Liquid Mucinex DM Twice daily  As needed  cough/congestion  Prednisone taper over next week-begin tomorrow.  Zpack take as directed.  Tessalon perles Three times a day  As needed  cough   Discuss with PCP that Lisinopril can aggravate cough.   Continue on CPAP At bedtime  Keep up good work  Work on [employer]   Follow up with Dr. Marchelle Gearing in 6-8 weeks with PFT -30 min slot  Please contact office for sooner follow up if symptoms do not improve or worsen or seek emergency care

## 2023-06-04 NOTE — Telephone Encounter (Signed)
Patient calling saw the pulmonologist today put on trelegy 200 mcg after finishing that she is to start the alvesco 160 mcg that was prescribed by chrissie. She was also given zithromax by the pulmonologist. I sent in albuterol 0.083% to the [facility]. Patient aware.

## 2023-06-04 NOTE — Progress Notes (Signed)
@Patient  ID: Rebecca [name], female    DOB: 05-28-1956, 67 y.o.   MRN: [id number]  Chief Complaint  Patient presents with   Follow-up     Discussed the use of AI scribe software for clinical note transcription with the patient, who gave verbal consent to proceed.  Referring provider: [name], MD  HPI: 67 year old female followed for severe obstructive sleep apnea  TEST/EVENTS :  PSG 09/13/2011 > AHI 59   Spirometry 08/30/15 >> FEV1 1.82 (78%), FEV1% 81   06/04/2023 ER follow up , Asthma  Patient complains over the last 2 weeks that she has had increased cough, wheezing, shortness of breath.  She has a history of asthma.  Was recently seen in the emergency room 2 days ago CT chest was negative for PE but showed subpleural reticular and groundglass opacities concerning for possible interstitial lung disease.  Patient had been started on prednisone 10 mg twice daily.  Patient says she is not feeling much better she continues to have ongoing cough and wheezing.  She was also started on Alvesco but has had to wait for the pharmacy to get it in stock.  ER notes show negative viral panel for RSV, COVID and influenza.  The patient also has a history of hypertension and has been on lisinopril for about five years.  The patient has a home nebulizer but has not been using it. She has been taking Robitussin DM for the cough. She has no known allergies except for hydrocodone and sulfa. The patient denied any recent travel, exposure to sick contacts, rashes, vomiting, or changes in appetite.   Patient has sleep apnea says she wears her CPAP machine every single night.  Feels that she benefits from CPAP with decreased daytime sleepiness.  CPAP download shows excellent compliance with 100% usage.  Daily average usage at 8.5 hours.  Patient is on CPAP 11 cm H2O.  AHI 1.0/hour.  Allergies  Allergen Reactions   Hydrocodone Hives, Shortness Of Breath and Itching   Sulfa Antibiotics Hives     Immunization History  Administered Date(s) Administered   Fluad Quad(high Dose 65+) 04/03/2022   Influenza, High Dose Seasonal PF 03/16/2019, 03/31/2023   Influenza,inj,Quad PF,6+ Mos 05/03/2015, 03/18/2016   Influenza-Unspecified 03/22/2014, 03/15/2017, 03/15/2018, 03/04/2020, 02/13/2021, 03/19/2022   PFIZER(Purple Top)SARS-COV-2 Vaccination 06/29/2019, 07/20/2019, 03/04/2020   PNEUMOCOCCAL CONJUGATE-20 04/21/2023   Pfizer(Comirnaty)Fall Seasonal Vaccine 12 years and older 03/31/2023   Pneumococcal Polysaccharide-23 08/17/2019   Zoster, Live 05/03/2015    Past Medical History:  Diagnosis Date   Arthritis    Asthma    Blindness, legal    L EYE   Breast cancer (HCC)    GERD (gastroesophageal reflux disease)    Hearing loss    Hyperlipidemia    Hypertension    Lapband May 2013 11/18/2011   Migraines    Obesity, Class III, BMI 40-49.9 (morbid obesity) (HCC) 06/03/2011   Pre-diabetes    Sleep apnea    uses c-pap   Vertigo     Tobacco History: Social History   Tobacco Use  Smoking Status Former   Current packs/day: 0.00   Average packs/day: 0.5 packs/day for 15.0 years (7.5 ttl pk-yrs)   Types: Cigarettes   Start date: 06/03/1979   Quit date: 06/02/1994   Years since quitting: 29.0  Smokeless Tobacco Never   Counseling given: Not Answered   Outpatient Medications Prior to Visit  Medication Sig Dispense Refill   albuterol (VENTOLIN HFA) 108 (90 Base) MCG/ACT inhaler Use 2  puffs every 4 hours as needed for cough or wheeze.  May use  2 puffs 10-20 minutes prior to exercise. 18 g 1   ALVESCO 160 MCG/ACT inhaler Inhale 1 puff into the lungs 2 (two) times daily. 1 each 2   hydrochlorothiazide (HYDRODIURIL) 12.5 MG tablet Take 1 tablet (12.5 mg total) by mouth daily. 90 tablet 3   ibuprofen (ADVIL) 800 MG tablet Take 800 mg by mouth 2 (two) times daily as needed.     lisinopril (ZESTRIL) 40 MG tablet Take 1 tablet (40 mg total) by mouth daily. 90 tablet 3    montelukast (SINGULAIR) 10 MG tablet Take 1 tablet (10 mg total) by mouth at bedtime. 30 tablet 5   Olopatadine-Mometasone (RYALTRIS) 665-25 MCG/ACT SUSP Place 2 sprays in each nostril twice a day as needed for stuffy nose/runny nose 29 g 5   potassium chloride SA (KLOR-CON M) 20 MEQ tablet Take 1 tablet (20 mEq total) by mouth daily. 90 tablet 3   tamoxifen (NOLVADEX) 20 MG tablet Take 20 mg by mouth daily.     Vitamin D, Ergocalciferol, (DRISDOL) 1.25 MG (50000 UNIT) CAPS capsule Take 1 capsule (50,000 Units total) by mouth every 7 (seven) days. 12 capsule 0   albuterol (PROVENTIL) (2.5 MG/3ML) 0.083% nebulizer solution Take 3 mLs (2.5 mg total) by nebulization every 4 (four) hours as needed for wheezing or shortness of breath. 75 mL 1   predniSONE (DELTASONE) 10 MG tablet Take one tablet twice a day for 5 days 10 tablet 0   No facility-administered medications prior to visit.     Review of Systems:   Constitutional:   No  weight loss, night sweats,  Fevers, chills, fatigue, or  lassitude.  HEENT:   No headaches,  Difficulty swallowing,  Tooth/dental problems, or  Sore throat,                No sneezing, itching, ear ache,  +nasal congestion, post nasal drip,   CV:  No chest pain,  Orthopnea, PND, swelling in lower extremities, anasarca, dizziness, palpitations, syncope.   GI  No heartburn, indigestion, abdominal pain, nausea, vomiting, diarrhea, change in bowel habits, loss of appetite, bloody stools.   Resp: .  No chest wall deformity  Skin: no rash or lesions.  GU: no dysuria, change in color of urine, no urgency or frequency.  No flank pain, no hematuria   MS:  No joint pain or swelling.  No decreased range of motion.  No back pain.    Physical Exam  BP (!) 110/90 (BP Location: Left Arm, Patient Position: Sitting, Cuff Size: Normal)   Pulse 90   Temp 98.4 F (36.9 C) (Oral)   Ht 5\' 1"  (1.549 m)   Wt 187 lb 3.2 oz (84.9 kg)   SpO2 96%   BMI 35.37 kg/m   GEN: A/Ox3;  pleasant , NAD, well nourished    HEENT:  [place]/AT,  EACs-clear, TMs-wnl, NOSE-clear, THROAT-clear, no lesions, no postnasal drip or exudate noted.   NECK:  Supple w/ fair ROM; no JVD; normal carotid impulses w/o bruits; no thyromegaly or nodules palpated; no lymphadenopathy.    RESP scattered rhonchi and expiratory wheezes.  Speaks in full sentences.  . no accessory muscle use, no dullness to percussion  CARD:  RRR, no m/r/g, no peripheral edema, pulses intact, no cyanosis or clubbing.  GI:   Soft & nt; nml bowel sounds; no organomegaly or masses detected.   Musco: Warm bil, no deformities or joint swelling  noted.   Neuro: alert, no focal deficits noted.    Skin: Warm, no lesions or rashes    Lab Results:  CBC    Component Value Date/Time   WBC 8.2 06/02/2023 1924   RBC 3.76 (L) 06/02/2023 1924   HGB 11.5 (L) 06/02/2023 1924   HGB 12.9 04/01/2022 0826   HCT 35.7 (L) 06/02/2023 1924   PLT 344 06/02/2023 1924   PLT 400 04/01/2022 0826   MCV 94.9 06/02/2023 1924   MCH 30.6 06/02/2023 1924   MCHC 32.2 06/02/2023 1924   RDW 13.1 06/02/2023 1924   LYMPHSABS 2.3 06/02/2023 1924   MONOABS 0.6 06/02/2023 1924   EOSABS 0.4 06/02/2023 1924   BASOSABS 0.1 06/02/2023 1924    BMET    Component Value Date/Time   NA 140 06/02/2023 1924   K 3.7 06/02/2023 1924   CL 107 06/02/2023 1924   CO2 23 06/02/2023 1924   GLUCOSE 116 (H) 06/02/2023 1924   BUN 16 06/02/2023 1924   CREATININE 0.96 06/02/2023 1924   CREATININE 0.92 04/01/2022 0826   CREATININE 0.79 12/27/2019 1210   CALCIUM 8.9 06/02/2023 1924   GFRNONAA >60 06/02/2023 1924   GFRNONAA >60 04/01/2022 0826   GFRAA >90 10/20/2011 1843    BNP No results found for: "BNP"  ProBNP No results found for: "PROBNP"  Imaging: CT Angio Chest PE W and/or Wo Contrast Result Date: 06/02/2023 CLINICAL DATA:  Possible PE. Persistent cough and chest pain for 5 days. Positive D-dimer. EXAM: CT ANGIOGRAPHY CHEST WITH CONTRAST  TECHNIQUE: Multidetector CT imaging of the chest was performed using the standard protocol during bolus administration of intravenous contrast. Multiplanar CT image reconstructions and MIPs were obtained to evaluate the vascular anatomy. RADIATION DOSE REDUCTION: This exam was performed according to the departmental dose-optimization program which includes automated exposure control, adjustment of the mA and/or kV according to patient size and/or use of iterative reconstruction technique. CONTRAST:  80mL OMNIPAQUE IOHEXOL 350 MG/ML SOLN COMPARISON:  Radiograph 06/02/2023 FINDINGS: Cardiovascular: Negative for acute pulmonary embolism. No pericardial effusion. Coronary artery and aortic atherosclerotic calcification Mediastinum/Nodes: Trachea and esophagus are unremarkable. No adenopathy. Lungs/Pleura: Subpleural reticular and ground-glass opacities suggesting interstitial lung disease. Benign cyst in the right lower lobe. No focal consolidation, pleural effusion, or pneumothorax. Upper Abdomen: No acute abnormality. Musculoskeletal: Advanced arthritis right shoulder. No acute fracture. Multilevel bridging anterior osteophytes in the thoracic spine. Review of the MIP images confirms the above findings. IMPRESSION: 1. Negative for acute pulmonary embolism. 2. Subpleural reticular and ground-glass opacities suggesting early/mild interstitial lung disease. Aortic Atherosclerosis (ICD10-I70.0). Electronically Signed   By: Minerva Fester M.D.   On: 06/02/2023 21:51   DG Chest 2 View Result Date: 06/02/2023 CLINICAL DATA:  Chest pain and cough. EXAM: CHEST - 2 VIEW COMPARISON:  02/04/2022 FINDINGS: The lungs are clear without focal pneumonia, edema, pneumothorax or pleural effusion. Subtle nodular densities project over the peripheral right upper lobe. The cardiopericardial silhouette is within normal limits for size. No acute bony abnormality. IMPRESSION: 1. No acute cardiopulmonary findings. 2. Subtle nodular  densities project over the peripheral right upper lobe. CT chest without contrast recommended to further evaluate. These results will be called to the ordering clinician or representative by the Radiologist Assistant, and communication documented in the PACS or [employer]. Electronically Signed   By: Kennith Center M.D.   On: 06/02/2023 14:16    albuterol (PROVENTIL) (2.5 MG/3ML) 0.083% nebulizer solution 2.5 mg     Date Action Dose Route User  06/04/2023 0981 Given 2.5 mg Nebulization Ellin Saba, Ledell Peoples, RN      levalbuterol [facility]) inhaler 4 puff     Date Action Dose Route User   Discharged on 06/02/2023   Admitted on 06/02/2023   06/02/2023 1914 Given 4 puff Inhalation Rolland Bimler D, CMA      methylPREDNISolone acetate (DEPO-MEDROL) injection 80 mg     Date Action Dose Route User   06/04/2023 [phone] Given 80 mg Intramuscular (Left Ventrogluteal) Ellin Saba, Ledell Peoples, RN           No data to display          No results found for: "NITRICOXIDE"      Assessment & Plan:   Asthma Slow to resolve asthmatic bronchitic exacerbation-albuterol nebulizer treatment given in the office today.  Patient will begin Trelegy inhaler-1 sample was given.  Will take 1 puff daily.  And then once sample is completed we will begin Alvesco inhaler as recommended.  Will give a Z-Pak and prednisone taper.  She was given a Depo-Medrol 80 mg IM injection in the office today.  Will begin prednisone burst tomorrow.  She is to discontinue previous steroid package.  Use Mucinex DM and Tessalon Perles for cough control.  Follow-up in 6 to 8 weeks with PFTs.  Plan  Patient Instructions  Begin Trelegy 1 puff daily until sample is gone then begin Alvesco inhaler, rinse after use.  Albuterol inhaler or neb As needed   Liquid Mucinex DM Twice daily  As needed  cough/congestion  Prednisone taper over next week-begin tomorrow.  Zpack take as directed.  Tessalon perles Three times  a day  As needed  cough   Discuss with PCP that Lisinopril can aggravate cough.   Continue on CPAP At bedtime  Keep up good work  Work on [employer]   Follow up with Dr. Marchelle Gearing in 6-8 weeks with PFT -30 min slot  Please contact office for sooner follow up if symptoms do not improve or worsen or seek emergency care        Abnormal CT of the chest Abnormal CT chest with subpleural reticular and groundglass opacities.  This could be underlying interstitial lung disease although patient is having an acute asthmatic rhonchitic flare.  Will treat empiric antibiotics and steroids.  Have patient return for full PFTs.  Pending these results decide if patient needs a high-resolution CT chest.     Rubye Oaks, NP 06/04/2023

## 2023-06-04 NOTE — Assessment & Plan Note (Signed)
Abnormal CT chest with subpleural reticular and groundglass opacities.  This could be underlying interstitial lung disease although patient is having an acute asthmatic rhonchitic flare.  Will treat empiric antibiotics and steroids.  Have patient return for full PFTs.  Pending these results decide if patient needs a high-resolution CT chest.

## 2023-06-09 ENCOUNTER — Emergency Department (HOSPITAL_COMMUNITY)
Admission: EM | Admit: 2023-06-09 | Discharge: 2023-06-09 | Disposition: A | Discharge status: Home / Self Care | Payer: 59 | Attending: Emergency Medicine | Admitting: Emergency Medicine

## 2023-06-09 ENCOUNTER — Other Ambulatory Visit: Payer: Self-pay

## 2023-06-09 ENCOUNTER — Emergency Department (HOSPITAL_COMMUNITY): Payer: 59

## 2023-06-09 ENCOUNTER — Encounter (HOSPITAL_COMMUNITY): Payer: Self-pay

## 2023-06-09 DIAGNOSIS — J45909 Unspecified asthma, uncomplicated: Secondary | ICD-10-CM | POA: Diagnosis not present

## 2023-06-09 DIAGNOSIS — R053 Chronic cough: Secondary | ICD-10-CM

## 2023-06-09 DIAGNOSIS — J40 Bronchitis, not specified as acute or chronic: Secondary | ICD-10-CM

## 2023-06-09 DIAGNOSIS — R059 Cough, unspecified: Secondary | ICD-10-CM | POA: Diagnosis present

## 2023-06-09 LAB — COMPREHENSIVE METABOLIC PANEL
ALT: 33 U/L (ref 0–44)
AST: 29 U/L (ref 15–41)
Albumin: 4.3 g/dL (ref 3.5–5.0)
Alkaline Phosphatase: 48 U/L (ref 38–126)
Anion gap: 11 (ref 5–15)
BUN: 39 mg/dL — ABNORMAL HIGH (ref 8–23)
CO2: 23 mmol/L (ref 22–32)
Calcium: 9.3 mg/dL (ref 8.9–10.3)
Chloride: 108 mmol/L (ref 98–111)
Creatinine, Ser: 1.3 mg/dL — ABNORMAL HIGH (ref 0.44–1.00)
GFR, Estimated: 45 mL/min — ABNORMAL LOW (ref >60)
Glucose, Bld: 101 mg/dL — ABNORMAL HIGH (ref 70–99)
Potassium: 3.7 mmol/L (ref 3.5–5.1)
Sodium: 142 mmol/L (ref 135–145)
Total Bilirubin: 0.4 mg/dL (ref <1.2)
Total Protein: 7.8 g/dL (ref 6.5–8.1)

## 2023-06-09 LAB — CBC
HCT: 38.1 % (ref 36.0–46.0)
Hemoglobin: 12.7 g/dL (ref 12.0–15.0)
MCH: 31.4 pg (ref 26.0–34.0)
MCHC: 33.3 g/dL (ref 30.0–36.0)
MCV: 94.3 fL (ref 80.0–100.0)
Platelets: 428 10*3/uL — ABNORMAL HIGH (ref 150–400)
RBC: 4.04 MIL/uL (ref 3.87–5.11)
RDW: 13.6 % (ref 11.5–15.5)
WBC: 15.1 10*3/uL — ABNORMAL HIGH (ref 4.0–10.5)
nRBC: 0 % (ref 0.0–0.2)

## 2023-06-09 MED ORDER — GUAIFENESIN-CODEINE 100-10 MG/5ML PO SOLN: 5.0000 mL | Freq: Two times a day (BID) | ORAL | 0 refills | Status: DC

## 2023-06-09 MED ORDER — ALBUTEROL SULFATE HFA 108 (90 BASE) MCG/ACT IN AERS: 2.0000 | INHALATION_SPRAY | RESPIRATORY_TRACT | Status: DC | PRN

## 2023-06-09 MED ORDER — METHYLPREDNISOLONE SODIUM SUCC 125 MG IJ SOLR
125.0000 mg | Freq: Once | INTRAMUSCULAR | Status: AC
  Administered 2023-06-09: 125 mg via INTRAVENOUS
  Filled 2023-06-09: qty 2

## 2023-06-09 MED ORDER — CEFDINIR 300 MG PO CAPS
300.0000 mg | ORAL_CAPSULE | Freq: Two times a day (BID) | ORAL | Status: DC
Start: 2023-06-09 — End: 2023-06-09
  Administered 2023-06-09: 300 mg via ORAL
  Filled 2023-06-09: qty 1

## 2023-06-09 MED ORDER — CEFDINIR 300 MG PO CAPS: 300.0000 mg | ORAL_CAPSULE | Freq: Two times a day (BID) | ORAL | 0 refills | Status: DC

## 2023-06-09 MED ORDER — ALBUTEROL SULFATE (2.5 MG/3ML) 0.083% IN NEBU
2.5000 mg | INHALATION_SOLUTION | Freq: Once | RESPIRATORY_TRACT | Status: AC
  Administered 2023-06-09: 2.5 mg via RESPIRATORY_TRACT
  Filled 2023-06-09: qty 3

## 2023-06-09 NOTE — ED Triage Notes (Signed)
Pt arrived via POV. C/o asthma exacerbation. Was seen a week ago for PE rule out.   Home meds have not been helping

## 2023-06-09 NOTE — ED Provider Notes (Signed)
[place] EMERGENCY DEPARTMENT AT [facility] Provider Note   CSN: [id number] Arrival date & time: 06/09/23  1111     History  Chief Complaint  Patient presents with   Asthma   Cough    SKYLARROSE HERINGER is a 67 y.o. female.  HPI   67 year old female with past medical history of asthma presents emergency department with concern for ongoing wheezing, shortness of breath.  She was seen a little over a week ago, sent home on prednisone.  She also believes that she finished a Z-Pak.  She comes in today with worsening asthma attacks.  States every time that she has hard coughing she ends up wheezing and short of breath.  Not relieved with home nebulizer/inhaler.  Denies any fever or chills, no swelling of her lower extremities.  Home Medications Prior to Admission medications   Medication Sig Start Date End Date Taking? Authorizing Provider  albuterol (PROVENTIL) (2.5 MG/3ML) 0.083% nebulizer solution Take 3 mLs (2.5 mg total) by nebulization every 4 (four) hours as needed for wheezing or shortness of breath. 06/04/23   Marcelyn Bruins, MD  albuterol (VENTOLIN HFA) 108 (90 Base) MCG/ACT inhaler Use 2 puffs every 4 hours as needed for cough or wheeze.  May use  2 puffs 10-20 minutes prior to exercise. 06/02/23   Nehemiah Settle, FNP  ALVESCO 160 MCG/ACT inhaler Inhale 1 puff into the lungs 2 (two) times daily. 06/02/23   Nehemiah Settle, FNP  azithromycin (ZITHROMAX Z-PAK) 250 MG tablet Take 2 tablets (500 mg) on  Day 1,  followed by 1 tablet (250 mg) once daily on Days 2 through 5. 06/04/23 06/09/23  Parrett, Virgel Bouquet, NP  benzonatate (TESSALON) 200 MG capsule Take 1 capsule (200 mg total) by mouth 3 (three) times daily as needed. 06/04/23 06/03/24  Parrett, Virgel Bouquet, NP  hydrochlorothiazide (HYDRODIURIL) 12.5 MG tablet Take 1 tablet (12.5 mg total) by mouth daily. 04/21/23   [name], MD  ibuprofen (ADVIL) 800 MG tablet Take 800 mg by mouth 2 (two) times daily as  needed. 04/06/23   [provider]  lisinopril (ZESTRIL) 40 MG tablet Take 1 tablet (40 mg total) by mouth daily. 04/21/23   [name], MD  montelukast (SINGULAIR) 10 MG tablet Take 1 tablet (10 mg total) by mouth at bedtime. 06/02/23   Nehemiah Settle, FNP  Olopatadine-Mometasone [facility]) [phone] MCG/ACT SUSP Place 2 sprays in each nostril twice a day as needed for stuffy nose/runny nose 06/02/23   Nehemiah Settle, FNP  potassium chloride SA (KLOR-CON M) 20 MEQ tablet Take 1 tablet (20 mEq total) by mouth daily. 04/22/23   [name], MD  predniSONE (DELTASONE) 10 MG tablet 4 tabs for 2 days, then 3 tabs for 2 days, 2 tabs for 2 days, then 1 tab for 2 days, then stop 06/04/23   Parrett, Tammy S, NP  tamoxifen (NOLVADEX) 20 MG tablet Take 20 mg by mouth daily.    [provider]  Vitamin D, Ergocalciferol, (DRISDOL) 1.25 MG (50000 UNIT) CAPS capsule Take 1 capsule (50,000 Units total) by mouth every 7 (seven) days. 04/22/23   [name], MD      Allergies    Hydrocodone and Sulfa antibiotics    Review of Systems   Review of Systems  Constitutional:  Positive for fatigue. Negative for chills and fever.  Respiratory:  Positive for cough, shortness of breath and wheezing. Negative for chest tightness.   Cardiovascular:  Negative for chest  pain, palpitations and leg swelling.  Gastrointestinal:  Negative for abdominal pain, diarrhea and vomiting.  Skin:  Negative for rash.  Neurological:  Negative for headaches.    Physical Exam Updated Vital Signs BP 135/71   Pulse 92   Temp 98.2 F (36.8 C) (Oral)   Resp (!) 21   Ht 5\' 1"  (1.549 m)   Wt 81.6 kg   SpO2 95%   BMI 34.01 kg/m  Physical Exam Vitals and nursing note reviewed.  Constitutional:      General: She is not in acute distress.    Appearance: Normal appearance.  HENT:     Head: Normocephalic.     Mouth/Throat:     Mouth: Mucous membranes are moist.  Cardiovascular:     Rate and Rhythm:  Normal rate.  Pulmonary:     Breath sounds: Wheezing present. No rales.  Chest:     Chest wall: No tenderness.  Abdominal:     Palpations: Abdomen is soft.     Tenderness: There is no abdominal tenderness.  Musculoskeletal:        General: No swelling.  Skin:    General: Skin is warm.  Neurological:     Mental Status: She is alert and oriented to person, place, and time. Mental status is at baseline.  Psychiatric:        Mood and Affect: Mood normal.     ED Results / Procedures / Treatments   Labs (all labs ordered are listed, but only abnormal results are displayed) Labs Reviewed  CBC  COMPREHENSIVE METABOLIC PANEL    EKG None  Radiology DG Chest 2 View Result Date: 06/09/2023 CLINICAL DATA:  SOB EXAM: CHEST - 2 VIEW COMPARISON:  06/02/2023. FINDINGS: The heart size and mediastinal contours are within normal limits. Both lungs are clear. No pneumothorax or pleural effusion. Aorta is calcified. There are thoracic degenerative changes. Gaseous distention of the stomach noted. There is a lap band. IMPRESSION: No acute cardiopulmonary disease. Electronically Signed   By: Layla Maw M.D.   On: 06/09/2023 12:04    Procedures Procedures    Medications Ordered in ED Medications  albuterol (VENTOLIN HFA) 108 (90 Base) MCG/ACT inhaler 2 puff (has no administration in time range)  albuterol (PROVENTIL) (2.5 MG/3ML) 0.083% nebulizer solution 2.5 mg (has no administration in time range)    ED Course/ Medical Decision Making/ A&P                                 Medical Decision Making Amount and/or Complexity of Data Reviewed Labs: ordered. Radiology: ordered.  Risk OTC drugs. Prescription drug management.   67 year old female presents emergency department with ongoing productive cough, wheezing.  Was seen a little over a week ago, is currently completing a regimen of prednisone.  Believes she may have also taken a Z-Pak prescribed by her primary doctor but  unclear.  Vital signs are normal and stable.  She has scattered wheezing on exam.  Had a PE ruled out a week ago when her symptoms had onset.  There is no hemoptysis or leg swelling, doubt PE at this time with recent rule out.  Chest x-ray shows no pneumonia, blood work shows mild leukocytosis, possibly from prednisone use.  Given the duration of the symptoms over a week we will plan to escalate care with cough medicine and antibiotics for bronchitis.  Will refer to outpatient.  Otherwise she has normal oxygenation, speaking  in full sentences, does not appear to need emergent admission.  Patient at this time appears safe and stable for discharge and close outpatient follow up. Discharge plan and strict return to ED precautions discussed, patient verbalizes understanding and agreement.        Final Clinical Impression(s) / ED Diagnoses Final diagnoses:  None    Rx / DC Orders ED Discharge Orders     None         Rozelle Logan, DO 06/09/23 1438

## 2023-06-09 NOTE — Discharge Instructions (Signed)
You have been seen and discharged from the emergency department.  You have been prescribed an antibiotic, take as directed.  You have also been prescribed cough medicine with codeine.  Take as needed.  Do not mix this medication with alcohol or other sedating medications. Do not drive or do heavy physical activity until you know how this medication affects you.  It may cause drowsiness.  Follow-up with your primary provider for further evaluation and further care. Take home medications as prescribed. If you have any worsening symptoms or further concerns for your health please return to an emergency department for further evaluation.

## 2023-06-09 NOTE — ED Notes (Signed)
Patient transported to x-ray. ?

## 2023-06-10 NOTE — Telephone Encounter (Signed)
Patient needs alvesco inhaler to be sent Rebecca [name] [phone]).

## 2023-06-11 ENCOUNTER — Encounter: Payer: Self-pay | Admitting: Pulmonary Disease

## 2023-06-11 ENCOUNTER — Telehealth: Payer: Self-pay | Admitting: Adult Health

## 2023-06-11 ENCOUNTER — Other Ambulatory Visit: Payer: Self-pay | Admitting: Pulmonary Disease

## 2023-06-11 MED ORDER — ALVESCO 160 MCG/ACT IN AERS: 1.0000 | INHALATION_SPRAY | Freq: Two times a day (BID) | RESPIRATORY_TRACT | 11 refills | Status: DC

## 2023-06-11 MED ORDER — PROMETHAZINE-CODEINE 6.25-10 MG/5ML PO SYRP: 5.0000 mL | ORAL_SOLUTION | Freq: Four times a day (QID) | ORAL | 0 refills | Status: DC | PRN

## 2023-06-11 MED ORDER — GUAIFENESIN-CODEINE 100-10 MG/5ML PO SOLN: 5.0000 mL | Freq: Two times a day (BID) | ORAL | 0 refills | Status: DC | PRN

## 2023-06-11 NOTE — Telephone Encounter (Signed)
Dr Val Eagle sent prescription cough syrup in  I spoke with the pt  She states that the [facility] does not have [facility] in [facility]  She is asking if [facility] [address]  Will route back to Dr. Wynona Neat

## 2023-06-11 NOTE — Telephone Encounter (Signed)
Spoke with the pt  She is c/o increased cough and wheezing  SHe was just seen her by Tammy on 06/04/23 and given Zpack and pred taper  She had to go to ED 06/09/23 bc her cough was so severe  She states they started her on new abx- cefdinir  She is still on steroid taper, mucinex, trelegy, albuterol She is not feeling improved at all  She is not coughing up any mucus "gets stuck in my throat" She denies any fevers, aches   Please advise, thanks!  Allergies  Allergen Reactions   Hydrocodone Hives, Shortness Of Breath and Itching   Sulfa Antibiotics Hives

## 2023-06-11 NOTE — Telephone Encounter (Signed)
Spoke with the pt  She was able to get albuterol solution  She [facility]  I have sent this in for her  Nothing further needed

## 2023-06-11 NOTE — Addendum Note (Signed)
Addended by: Luciano Cutter on: 06/11/2023 04:43 PM   Modules accepted: Orders

## 2023-06-11 NOTE — Telephone Encounter (Signed)
Pt calling in bc she has been in the hospital and has a appt sch but she still is having trouble breathing at the moment

## 2023-06-11 NOTE — Telephone Encounter (Signed)
Still has ongoing cough  Requests refill of cough syrup.  Pharmacy does not have promethazine with codeine , called in guaifensin with codeine  Advised on sedating effects of cough syrup Finish abx.  Consider changing Lisinopril for ongoing chronic cough  ER -CT chest neg for PE. ? ILD  Needs OV for follow up  Call office next week  Please contact office for sooner follow up if symptoms do not improve or worsen or seek emergency care

## 2023-06-11 NOTE — Progress Notes (Signed)
[facility] Telephone After Hours Encounter  Patient [facility] [address]. Order resent to preferred pharmacy.

## 2023-06-14 ENCOUNTER — Emergency Department (HOSPITAL_COMMUNITY): Payer: 59

## 2023-06-14 ENCOUNTER — Encounter: Payer: Self-pay | Admitting: Pulmonary Disease

## 2023-06-14 ENCOUNTER — Encounter (HOSPITAL_COMMUNITY): Payer: Self-pay | Admitting: *Deleted

## 2023-06-14 ENCOUNTER — Inpatient Hospital Stay (HOSPITAL_COMMUNITY)
Admission: EM | Admit: 2023-06-14 | Discharge: 2023-06-23 | DRG: 202 | Disposition: A | Discharge status: Home / Self Care | Payer: 59 | Attending: Internal Medicine | Admitting: Internal Medicine

## 2023-06-14 ENCOUNTER — Ambulatory Visit: Payer: Self-pay | Admitting: Family Medicine

## 2023-06-14 ENCOUNTER — Other Ambulatory Visit: Payer: Self-pay

## 2023-06-14 ENCOUNTER — Other Ambulatory Visit: Payer: Self-pay | Admitting: Pulmonary Disease

## 2023-06-14 DIAGNOSIS — Z853 Personal history of malignant neoplasm of breast: Secondary | ICD-10-CM

## 2023-06-14 DIAGNOSIS — G4733 Obstructive sleep apnea (adult) (pediatric): Secondary | ICD-10-CM | POA: Diagnosis present

## 2023-06-14 DIAGNOSIS — E66813 Obesity, class 3: Secondary | ICD-10-CM | POA: Diagnosis present

## 2023-06-14 DIAGNOSIS — E119 Type 2 diabetes mellitus without complications: Secondary | ICD-10-CM | POA: Diagnosis present

## 2023-06-14 DIAGNOSIS — N179 Acute kidney failure, unspecified: Secondary | ICD-10-CM | POA: Diagnosis present

## 2023-06-14 DIAGNOSIS — J4551 Severe persistent asthma with (acute) exacerbation: Principal | ICD-10-CM | POA: Diagnosis present

## 2023-06-14 DIAGNOSIS — Z6836 Body mass index (BMI) 36.0-36.9, adult: Secondary | ICD-10-CM

## 2023-06-14 DIAGNOSIS — Z8249 Family history of ischemic heart disease and other diseases of the circulatory system: Secondary | ICD-10-CM

## 2023-06-14 DIAGNOSIS — R0602 Shortness of breath: Secondary | ICD-10-CM | POA: Diagnosis not present

## 2023-06-14 DIAGNOSIS — E876 Hypokalemia: Secondary | ICD-10-CM | POA: Diagnosis present

## 2023-06-14 DIAGNOSIS — Z1152 Encounter for screening for COVID-19: Secondary | ICD-10-CM

## 2023-06-14 DIAGNOSIS — H9190 Unspecified hearing loss, unspecified ear: Secondary | ICD-10-CM | POA: Diagnosis present

## 2023-06-14 DIAGNOSIS — R0603 Acute respiratory distress: Secondary | ICD-10-CM | POA: Diagnosis present

## 2023-06-14 DIAGNOSIS — Z9884 Bariatric surgery status: Secondary | ICD-10-CM

## 2023-06-14 DIAGNOSIS — D72829 Elevated white blood cell count, unspecified: Secondary | ICD-10-CM | POA: Diagnosis present

## 2023-06-14 DIAGNOSIS — Z882 Allergy status to sulfonamides status: Secondary | ICD-10-CM

## 2023-06-14 DIAGNOSIS — Z79899 Other long term (current) drug therapy: Secondary | ICD-10-CM

## 2023-06-14 DIAGNOSIS — Z923 Personal history of irradiation: Secondary | ICD-10-CM

## 2023-06-14 DIAGNOSIS — Z87891 Personal history of nicotine dependence: Secondary | ICD-10-CM

## 2023-06-14 DIAGNOSIS — Z803 Family history of malignant neoplasm of breast: Secondary | ICD-10-CM

## 2023-06-14 DIAGNOSIS — T380X5A Adverse effect of glucocorticoids and synthetic analogues, initial encounter: Secondary | ICD-10-CM | POA: Diagnosis present

## 2023-06-14 DIAGNOSIS — Z888 Allergy status to other drugs, medicaments and biological substances status: Secondary | ICD-10-CM

## 2023-06-14 DIAGNOSIS — H548 Legal blindness, as defined in USA: Secondary | ICD-10-CM | POA: Diagnosis present

## 2023-06-14 DIAGNOSIS — E785 Hyperlipidemia, unspecified: Secondary | ICD-10-CM | POA: Diagnosis present

## 2023-06-14 DIAGNOSIS — Z885 Allergy status to narcotic agent status: Secondary | ICD-10-CM

## 2023-06-14 DIAGNOSIS — M1990 Unspecified osteoarthritis, unspecified site: Secondary | ICD-10-CM | POA: Diagnosis present

## 2023-06-14 DIAGNOSIS — E872 Acidosis, unspecified: Secondary | ICD-10-CM | POA: Diagnosis present

## 2023-06-14 DIAGNOSIS — Z79810 Long term (current) use of selective estrogen receptor modulators (SERMs): Secondary | ICD-10-CM

## 2023-06-14 DIAGNOSIS — Z800 Family history of malignant neoplasm of digestive organs: Secondary | ICD-10-CM

## 2023-06-14 DIAGNOSIS — Z7951 Long term (current) use of inhaled steroids: Secondary | ICD-10-CM

## 2023-06-14 DIAGNOSIS — J45901 Unspecified asthma with (acute) exacerbation: Principal | ICD-10-CM | POA: Diagnosis present

## 2023-06-14 DIAGNOSIS — J4541 Moderate persistent asthma with (acute) exacerbation: Secondary | ICD-10-CM

## 2023-06-14 DIAGNOSIS — Z90710 Acquired absence of both cervix and uterus: Secondary | ICD-10-CM

## 2023-06-14 DIAGNOSIS — K219 Gastro-esophageal reflux disease without esophagitis: Secondary | ICD-10-CM | POA: Diagnosis present

## 2023-06-14 DIAGNOSIS — Z96651 Presence of right artificial knee joint: Secondary | ICD-10-CM | POA: Diagnosis present

## 2023-06-14 DIAGNOSIS — I10 Essential (primary) hypertension: Secondary | ICD-10-CM | POA: Diagnosis present

## 2023-06-14 DIAGNOSIS — Z811 Family history of alcohol abuse and dependence: Secondary | ICD-10-CM

## 2023-06-14 LAB — CBC WITH DIFFERENTIAL/PLATELET
Abs Immature Granulocytes: 0.07 10*3/uL (ref 0.00–0.07)
Basophils Absolute: 0 10*3/uL (ref 0.0–0.1)
Basophils Relative: 0 %
Eosinophils Absolute: 0.1 10*3/uL (ref 0.0–0.5)
Eosinophils Relative: 1 %
HCT: 37.6 % (ref 36.0–46.0)
Hemoglobin: 12.4 g/dL (ref 12.0–15.0)
Immature Granulocytes: 0 %
Lymphocytes Relative: 23 %
Lymphs Abs: 3.9 10*3/uL (ref 0.7–4.0)
MCH: 31.2 pg (ref 26.0–34.0)
MCHC: 33 g/dL (ref 30.0–36.0)
MCV: 94.7 fL (ref 80.0–100.0)
Monocytes Absolute: 0.4 10*3/uL (ref 0.1–1.0)
Monocytes Relative: 2 %
Neutro Abs: 12.4 10*3/uL — ABNORMAL HIGH (ref 1.7–7.7)
Neutrophils Relative %: 74 %
Platelets: 382 10*3/uL (ref 150–400)
RBC: 3.97 MIL/uL (ref 3.87–5.11)
RDW: 13.7 % (ref 11.5–15.5)
WBC: 16.9 10*3/uL — ABNORMAL HIGH (ref 4.0–10.5)
nRBC: 0 % (ref 0.0–0.2)

## 2023-06-14 LAB — RESPIRATORY PANEL BY PCR

## 2023-06-14 LAB — BASIC METABOLIC PANEL
Anion gap: 10 (ref 5–15)
BUN: 29 mg/dL — ABNORMAL HIGH (ref 8–23)
CO2: 19 mmol/L — ABNORMAL LOW (ref 22–32)
Calcium: 9 mg/dL (ref 8.9–10.3)
Chloride: 110 mmol/L (ref 98–111)
Creatinine, Ser: 1.06 mg/dL — ABNORMAL HIGH (ref 0.44–1.00)
GFR, Estimated: 57 mL/min — ABNORMAL LOW (ref >60)
Glucose, Bld: 132 mg/dL — ABNORMAL HIGH (ref 70–99)
Potassium: 3.8 mmol/L (ref 3.5–5.1)
Sodium: 139 mmol/L (ref 135–145)

## 2023-06-14 LAB — RESP PANEL BY RT-PCR (RSV, FLU A&B, COVID)  RVPGX2
Influenza A by PCR: NEGATIVE
Influenza B by PCR: NEGATIVE
Resp Syncytial Virus by PCR: NEGATIVE
SARS Coronavirus 2 by RT PCR: NEGATIVE

## 2023-06-14 LAB — BRAIN NATRIURETIC PEPTIDE: B Natriuretic Peptide: 18.8 pg/mL (ref 0.0–100.0)

## 2023-06-14 MED ORDER — SENNOSIDES-DOCUSATE SODIUM 8.6-50 MG PO TABS
1.0000 | ORAL_TABLET | Freq: Every evening | ORAL | Status: DC | PRN
  Administered 2023-06-16: 1 via ORAL
  Filled 2023-06-14: qty 1

## 2023-06-14 MED ORDER — BENZONATATE 100 MG PO CAPS
200.0000 mg | ORAL_CAPSULE | Freq: Three times a day (TID) | ORAL | Status: DC | PRN
  Administered 2023-06-14 – 2023-06-15 (×2): 200 mg via ORAL
  Filled 2023-06-14 (×2): qty 2

## 2023-06-14 MED ORDER — CICLESONIDE 160 MCG/ACT IN AERS: 1.0000 | INHALATION_SPRAY | Freq: Two times a day (BID) | RESPIRATORY_TRACT | Status: DC

## 2023-06-14 MED ORDER — ONDANSETRON HCL 4 MG PO TABS: 4.0000 mg | ORAL_TABLET | Freq: Four times a day (QID) | ORAL | Status: DC | PRN

## 2023-06-14 MED ORDER — HYDROCHLOROTHIAZIDE 12.5 MG PO TABS
12.5000 mg | ORAL_TABLET | Freq: Every day | ORAL | Status: DC
  Administered 2023-06-15 – 2023-06-16 (×2): 12.5 mg via ORAL
  Filled 2023-06-14 (×2): qty 1

## 2023-06-14 MED ORDER — ACETAMINOPHEN 650 MG RE SUPP: 650.0000 mg | Freq: Four times a day (QID) | RECTAL | Status: DC | PRN

## 2023-06-14 MED ORDER — ONDANSETRON HCL 4 MG/2ML IJ SOLN
4.0000 mg | Freq: Four times a day (QID) | INTRAMUSCULAR | Status: DC | PRN
  Administered 2023-06-19: 4 mg via INTRAVENOUS
  Filled 2023-06-14: qty 2

## 2023-06-14 MED ORDER — ALBUTEROL SULFATE HFA 108 (90 BASE) MCG/ACT IN AERS
2.0000 | INHALATION_SPRAY | RESPIRATORY_TRACT | Status: DC | PRN
  Administered 2023-06-14: 2 via RESPIRATORY_TRACT
  Filled 2023-06-14: qty 6.7

## 2023-06-14 MED ORDER — ALBUTEROL SULFATE (2.5 MG/3ML) 0.083% IN NEBU
2.5000 mg | INHALATION_SOLUTION | RESPIRATORY_TRACT | Status: DC | PRN
Start: 2023-06-14 — End: 2023-06-23
  Administered 2023-06-14 – 2023-06-23 (×11): 2.5 mg via RESPIRATORY_TRACT
  Filled 2023-06-14 (×12): qty 3

## 2023-06-14 MED ORDER — TRAZODONE HCL 50 MG PO TABS
25.0000 mg | ORAL_TABLET | Freq: Every evening | ORAL | Status: DC | PRN
  Administered 2023-06-14 – 2023-06-22 (×8): 25 mg via ORAL
  Filled 2023-06-14 (×8): qty 1

## 2023-06-14 MED ORDER — METHYLPREDNISOLONE SODIUM SUCC 40 MG IJ SOLR
40.0000 mg | Freq: Two times a day (BID) | INTRAMUSCULAR | Status: DC
  Administered 2023-06-14 – 2023-06-15 (×2): 40 mg via INTRAVENOUS
  Filled 2023-06-14 (×2): qty 1

## 2023-06-14 MED ORDER — MONTELUKAST SODIUM 10 MG PO TABS
10.0000 mg | ORAL_TABLET | Freq: Every day | ORAL | Status: DC
  Administered 2023-06-14 – 2023-06-22 (×9): 10 mg via ORAL
  Filled 2023-06-14 (×9): qty 1

## 2023-06-14 MED ORDER — ACETAMINOPHEN 325 MG PO TABS
650.0000 mg | ORAL_TABLET | Freq: Four times a day (QID) | ORAL | Status: DC | PRN
  Administered 2023-06-15: 650 mg via ORAL
  Filled 2023-06-14: qty 2

## 2023-06-14 MED ORDER — GUAIFENESIN-CODEINE 100-10 MG/5ML PO SOLN
5.0000 mL | Freq: Two times a day (BID) | ORAL | Status: DC | PRN
  Administered 2023-06-14: 5 mL via ORAL
  Filled 2023-06-14: qty 5

## 2023-06-14 MED ORDER — ALBUTEROL SULFATE (2.5 MG/3ML) 0.083% IN NEBU
INHALATION_SOLUTION | RESPIRATORY_TRACT | Status: AC
  Administered 2023-06-14: 7.5 mg
  Filled 2023-06-14: qty 9

## 2023-06-14 MED ORDER — BUDESONIDE 0.5 MG/2ML IN SUSP
0.5000 mg | Freq: Two times a day (BID) | RESPIRATORY_TRACT | Status: DC
  Administered 2023-06-15 – 2023-06-19 (×9): 0.5 mg via RESPIRATORY_TRACT
  Filled 2023-06-14 (×10): qty 2

## 2023-06-14 MED ORDER — ALBUTEROL SULFATE (2.5 MG/3ML) 0.083% IN NEBU
10.0000 mg/h | INHALATION_SOLUTION | Freq: Once | RESPIRATORY_TRACT | Status: AC
  Administered 2023-06-14: 10 mg/h via RESPIRATORY_TRACT
  Filled 2023-06-14: qty 3

## 2023-06-14 MED ORDER — ENOXAPARIN SODIUM 40 MG/0.4ML IJ SOSY
40.0000 mg | PREFILLED_SYRINGE | INTRAMUSCULAR | Status: DC
  Administered 2023-06-14 – 2023-06-22 (×8): 40 mg via SUBCUTANEOUS
  Filled 2023-06-14 (×9): qty 0.4

## 2023-06-14 MED ORDER — POTASSIUM CHLORIDE CRYS ER 20 MEQ PO TBCR
20.0000 meq | EXTENDED_RELEASE_TABLET | Freq: Every day | ORAL | Status: DC
  Administered 2023-06-14 – 2023-06-23 (×10): 20 meq via ORAL
  Filled 2023-06-14 (×10): qty 1

## 2023-06-14 NOTE — Progress Notes (Signed)
   06/14/23 2354  BiPAP/CPAP/SIPAP  $ Non-Invasive Home Ventilator  Initial  $ Face Mask Medium Yes  BiPAP/CPAP/SIPAP Pt Type Adult  BiPAP/CPAP/SIPAP DREAMSTATIOND  Mask Type Full face mask  Mask Size Medium  Respiratory Rate 16 breaths/min  FiO2 (%) 21 %  Patient Home Equipment No  Auto Titrate Yes (Auto <6/>16)  CPAP/SIPAP surface wiped down Yes   Placed pt. on cpap for h/s, automode, r/a., made aware to notify if needed.

## 2023-06-14 NOTE — Telephone Encounter (Signed)
Copied from CRM [id number]. Topic: Clinical - Red Word Triage >> Jun 14, 2023 11:13 AM Fredrich Romans wrote: Red Word that prompted transfer to Nurse Triage: hard time breathing with coughing     Chief Complaint: cough and shortness of breath x 2 weeks Symptoms: chest pain, weakness, and cough Frequency: constant Pertinent Negatives: Patient denies fever Disposition: [x] ED /[] Urgent Care (no appt availability in office) / [] Appointment(In office/virtual)/ []  [facility]/ [] Home Care/ [] Refused Recommended Disposition /[] [facility]/ []  Follow-up with PCP Additional Notes: Patient reports worsening cough and trouble breathing over the past 2 weeks. Pt denies fevers. Sts that she has had 2 ED visits and nothing has been found, but cough persist. Pt endorses central chest pain, present when not coughing, describes as squeezing pressure. RN advised ED,  Pts son Mena Goes is with her, sts that he will take her to [facility].  Reason for Disposition  Chest pain  (Exception: MILD central chest pain, present only when coughing.)  Answer Assessment - Initial Assessment Questions 1. ONSET: "When did the cough begin?"      Over 2 weeks  2. SEVERITY: "How bad is the cough today?"  Severe cough, difficulty breathing      3. SPUTUM: "Describe the color of your sputum" (none, dry cough; clear, white, yellow, green)     White   4. HEMOPTYSIS: "Are you coughing up any blood?" If so ask: "How much?" (flecks, streaks, tablespoons, etc.)     No blood  5. DIFFICULTY BREATHING: "Are you having difficulty breathing?" If Yes, ask: "How bad is it?" (e.g., mild, moderate, severe)    - MILD: No SOB at rest, mild SOB with walking, speaks normally in sentences, can lie down, no retractions, pulse < 100.    - MODERATE: SOB at rest, SOB with minimal exertion and prefers to sit, cannot lie down flat, speaks in phrases, mild retractions, audible wheezing, pulse 100-120.    - SEVERE: Very SOB at rest,  speaks in single words, struggling to breathe, sitting hunched forward, retractions, pulse > 120      Moderate shortness of breath, speaks in phrases on phone  6. FEVER: "Do you have a fever?" If Yes, ask: "What is your temperature, how was it measured, and when did it start?"     No fevers  7. CARDIAC HISTORY: "Do you have any history of heart disease?" (e.g., heart attack, congestive heart failure)      No history  8. LUNG HISTORY: "Do you have any history of lung disease?"  (e.g., pulmonary embolus, asthma, emphysema)     Asthma  9. PE RISK FACTORS: "Do you have a history of blood clots?" (or: recent major surgery, recent prolonged travel, bedridden)     No  10. OTHER SYMPTOMS: "Do you have any other symptoms?" (e.g., runny nose, wheezing, chest pain)       Chest pain  11. PREGNANCY: "Is there any chance you are pregnant?" "When was your last menstrual period?"       N/a  12. TRAVEL: "Have you traveled out of the country in the last month?" (e.g., travel history, exposures)       No  Protocols used: Cough - Acute Productive-A-AH

## 2023-06-14 NOTE — Telephone Encounter (Signed)
Called patient to follow-up, patient is being seen in the ED

## 2023-06-14 NOTE — ED Provider Notes (Signed)
EMERGENCY DEPARTMENT AT [facility] Provider Note   CSN: [id number] Arrival date & time: 06/14/23  1243     History  Chief Complaint  Patient presents with   Shortness of Breath    Rebecca [name] is a 67 y.o. female.   Shortness of Breath Associated symptoms: wheezing      67 year old female with medical history significant for asthma who presents emergency department with shortness of breath.  The patient states that symptoms of ongoing since Christmas.  She has had 5 days of cough and difficulty catching her breath.  She has been trying home treatments without improvement.  She was administered a DuoNeb with EMS and 125 mg of Solu-Medrol prior to arrival.  She denies any chest pain.  Symptoms have been ongoing for roughly 3 weeks of cough with associated shortness of breath with wheezing.  She has been on prednisone for the last 2 weeks.  She finished a prednisone taper yesterday.  She did complete a course of azithromycin as well.  Home Medications Prior to Admission medications   Medication Sig Start Date End Date Taking? Authorizing Provider  albuterol (PROVENTIL) (2.5 MG/3ML) 0.083% nebulizer solution Take 3 mLs (2.5 mg total) by nebulization every 4 (four) hours as needed for wheezing or shortness of breath. 06/04/23  Yes Padgett, Pilar Grammes, MD  albuterol (VENTOLIN HFA) 108 (90 Base) MCG/ACT inhaler Use 2 puffs every 4 hours as needed for cough or wheeze.  May use  2 puffs 10-20 minutes prior to exercise. Patient taking differently: Inhale 2 puffs into the lungs every 4 (four) hours as needed for wheezing or shortness of breath (or coughing and may also use 2 puffs 10 to 20 minutes prior to exercise). 06/02/23  Yes Nehemiah Settle, FNP  benzonatate (TESSALON) 200 MG capsule Take 1 capsule (200 mg total) by mouth 3 (three) times daily as needed. Patient taking differently: Take 200 mg by mouth 3 (three) times daily as needed for cough. 06/04/23  06/03/24 Yes Parrett, Tammy S, NP  guaiFENesin-codeine 100-10 MG/5ML syrup Take 5 mLs by mouth every 12 (twelve) hours as needed for cough. 06/11/23  Yes Parrett, Tammy S, NP  hydrochlorothiazide (HYDRODIURIL) 12.5 MG tablet Take 1 tablet (12.5 mg total) by mouth daily. 04/21/23  Yes [name], MD  ibuprofen (ADVIL) 800 MG tablet Take 800 mg by mouth 2 (two) times daily as needed (for pain). 04/06/23  Yes [provider]  montelukast (SINGULAIR) 10 MG tablet Take 1 tablet (10 mg total) by mouth at bedtime. 06/02/23  Yes Nehemiah Settle, FNP  potassium chloride SA (KLOR-CON M) 20 MEQ tablet Take 1 tablet (20 mEq total) by mouth daily. 04/22/23  Yes [name], MD  PRESCRIPTION MEDICATION See admin instructions. CPAP- At bedtime   Yes [provider]  tamoxifen (NOLVADEX) 20 MG tablet Take 20 mg by mouth daily.   Yes [provider]  Vitamin D, Ergocalciferol, (DRISDOL) 1.25 MG (50000 UNIT) CAPS capsule Take 1 capsule (50,000 Units total) by mouth every 7 (seven) days. Patient taking differently: Take 50,000 Units by mouth every Saturday. 04/22/23  Yes [name], MD  ALVESCO 160 MCG/ACT inhaler Inhale 1 puff into the lungs 2 (two) times daily. Patient not taking: Reported on 06/14/2023 06/11/23   Parrett, Virgel Bouquet, NP  cefdinir (OMNICEF) 300 MG capsule Take 1 capsule (300 mg total) by mouth 2 (two) times daily. Patient not taking: Reported on 06/14/2023 06/09/23   Horton, Clabe Seal, DO  lisinopril (ZESTRIL) 40 MG tablet Take 1 tablet (40 mg total) by mouth daily. Patient not taking: Reported on 06/14/2023 04/21/23   [name], MD  Olopatadine-Mometasone Cristal Generous) [phone] MCG/ACT SUSP Place 2 sprays in each nostril twice a day as needed for stuffy nose/runny nose Patient not taking: Reported on 06/14/2023 06/02/23   Nehemiah Settle, FNP  predniSONE (DELTASONE) 10 MG tablet 4 tabs for 2 days, then 3 tabs for 2 days, 2 tabs for 2 days, then 1 tab for 2  days, then stop Patient not taking: Reported on 06/14/2023 06/04/23   Parrett, Virgel Bouquet, NP      Allergies    Hydrocodone, Lisinopril, and Sulfa antibiotics    Review of Systems   Review of Systems  Respiratory:  Positive for shortness of breath and wheezing.   All other systems reviewed and are negative.   Physical Exam Updated Vital Signs BP (!) 160/76 (BP Location: Left Arm)   Pulse (!) 104   Temp 98.2 F (36.8 C) (Oral)   Resp (!) 23   Ht 5\' 1"  (1.549 m)   Wt 86.7 kg   SpO2 94%   BMI 36.10 kg/m  Physical Exam Vitals and nursing note reviewed.  Constitutional:      General: She is not in acute distress.    Appearance: She is well-developed. She is obese.  HENT:     Head: Normocephalic and atraumatic.  Eyes:     Conjunctiva/sclera: Conjunctivae normal.  Cardiovascular:     Rate and Rhythm: Normal rate and regular rhythm.     Heart sounds: No murmur heard. Pulmonary:     Effort: Pulmonary effort is normal. Tachypnea present. No respiratory distress.     Breath sounds: Examination of the right-upper field reveals wheezing. Examination of the left-upper field reveals wheezing. Examination of the right-middle field reveals wheezing. Examination of the left-middle field reveals wheezing. Examination of the right-lower field reveals wheezing. Examination of the left-lower field reveals wheezing. Decreased breath sounds and wheezing present.     Comments: Bilateral expiratory wheezing present, diminished air movement bilaterally Abdominal:     Palpations: Abdomen is soft.     Tenderness: There is no abdominal tenderness.  Musculoskeletal:        General: No swelling.     Cervical back: Neck supple.  Skin:    General: Skin is warm and dry.     Capillary Refill: Capillary refill takes less than 2 seconds.  Neurological:     Mental Status: She is alert.  Psychiatric:        Mood and Affect: Mood normal.     ED Results / Procedures / Treatments   Labs (all labs  ordered are listed, but only abnormal results are displayed) Labs Reviewed  CBC WITH DIFFERENTIAL/PLATELET - Abnormal; Notable for the following components:      Result Value   WBC 16.9 (*)    Neutro Abs 12.4 (*)    All other components within normal limits  BASIC METABOLIC PANEL - Abnormal; Notable for the following components:   CO2 19 (*)    Glucose, Bld 132 (*)    BUN 29 (*)    Creatinine, Ser 1.06 (*)    GFR, Estimated 57 (*)    All other components within normal limits  RESP PANEL BY RT-PCR (RSV, FLU A&B, COVID)  [id number]  RESPIRATORY PANEL BY PCR  BRAIN NATRIURETIC PEPTIDE  HIV ANTIBODY (ROUTINE TESTING W REFLEX)  BASIC METABOLIC PANEL  CBC    EKG  EKG Interpretation Date/Time:  Monday June 14 2023 12:51:52 EST Ventricular Rate:  103 PR Interval:  152 QRS Duration:  82 QT Interval:  334 QTC Calculation: 438 R Axis:   55  Text Interpretation: Sinus tachycardia Low voltage, precordial leads Probable anteroseptal infarct, old Borderline repolarization abnormality Confirmed by Ernie Avena ([id number]) on 06/14/2023 12:59:44 PM  Radiology DG Chest 2 View Result Date: 06/14/2023 CLINICAL DATA:  Shortness of breath. EXAM: CHEST - 2 VIEW COMPARISON:  06/09/2023. FINDINGS: There is homogeneous opacity overlying the left lateral costophrenic angle, which corresponds to prominent epicardial fat pad. Bilateral lung fields are otherwise clear. Bilateral costophrenic angles are clear. Stable cardio-mediastinal silhouette. No acute osseous abnormalities. The soft tissues are within normal limits. IMPRESSION: No active cardiopulmonary disease. Electronically Signed   By: Jules Schick M.D.   On: 06/14/2023 14:39    Procedures Procedures    Medications Ordered in ED Medications  hydrochlorothiazide (HYDRODIURIL) tablet 12.5 mg (has no administration in time range)  potassium chloride SA (KLOR-CON M) CR tablet 20 mEq (20 mEq Oral Given 06/14/23 2131)  ciclesonide (ALVESCO) 160  MCG/ACT inhaler 1 puff (1 puff Inhalation Not Given 06/14/23 2142)  benzonatate (TESSALON) capsule 200 mg (200 mg Oral Given 06/14/23 2131)  guaiFENesin-codeine 100-10 MG/5ML solution 5 mL (5 mLs Oral Given 06/14/23 2131)  montelukast (SINGULAIR) tablet 10 mg (10 mg Oral Given 06/14/23 2210)  enoxaparin (LOVENOX) injection 40 mg (40 mg Subcutaneous Given 06/14/23 1834)  methylPREDNISolone sodium succinate (SOLU-MEDROL) 40 mg/mL injection 40 mg (40 mg Intravenous Given 06/14/23 1834)  acetaminophen (TYLENOL) tablet 650 mg (has no administration in time range)    Or  acetaminophen (TYLENOL) suppository 650 mg (has no administration in time range)  traZODone (DESYREL) tablet 25 mg (25 mg Oral Given 06/14/23 2131)  senna-docusate (Senokot-S) tablet 1 tablet (has no administration in time range)  ondansetron (ZOFRAN) tablet 4 mg (has no administration in time range)    Or  ondansetron (ZOFRAN) injection 4 mg (has no administration in time range)  albuterol (PROVENTIL) (2.5 MG/3ML) 0.083% nebulizer solution 2.5 mg (2.5 mg Nebulization Given 06/14/23 2141)  albuterol (PROVENTIL) (2.5 MG/3ML) 0.083% nebulizer solution (10 mg/hr Nebulization Given 06/14/23 1400)  albuterol (PROVENTIL) (2.5 MG/3ML) 0.083% nebulizer solution (7.5 mg  Given 06/14/23 1419)    ED Course/ Medical Decision Making/ A&P                                 Medical Decision Making Amount and/or Complexity of Data Reviewed Labs: ordered. Radiology: ordered.  Risk Prescription drug management. Decision regarding hospitalization.    67 year old female with medical history significant for asthma who presents emergency department with shortness of breath.  The patient states that symptoms of ongoing since Christmas.  She has had 5 days of cough and difficulty catching her breath.  She has been trying home treatments without improvement.  She was administered a DuoNeb with EMS and 125 mg of Solu-Medrol prior to arrival.  She  denies any chest pain.  Symptoms have been ongoing for roughly 3 weeks of cough with associated shortness of breath with wheezing.  She has been on prednisone for the last 2 weeks.  She finished a prednisone taper yesterday.  She did complete a course of azithromycin as well.  On arrival, the patient was afebrile, temperature 98.6, tachycardic status post DuoNeb treatment, heart rate 105, confirmed on cardiac telemetry, respiratory rate normal, saturating 95% on room  air my evaluation.  Physical exam revealed evidence of an asthma exacerbation with diffuse wheezing in all lung fields, mild tachypnea on my exam, decreased air movement.  Patient is status post Solu-Medrol with EMS and 1 DuoNeb.  She was placed on continuous albuterol nebulizer for an hour.  Concern for viral infection, developing bacterial pneumonia, acute bronchitis, asthma exacerbation.  Chest x-ray revealed no focal consolidation to suggest bacterial pneumonia, CBC revealed a leukocytosis to 16.9 but the patient has been on steroids outpatient, COVID-19, influenza, RSV PCR testing was collected and resulted negative, BMP revealed a mild acidosis with a bicarbonate of 19, anion gap normal, creatinine 1.06, no other significant electrolyte abnormality.  On repeat assessment, the patient had continued ongoing symptoms of shortness of breath and was persistently wheezing on exam.  In the setting of this, I recommended admission for observation in the setting of an asthma exacerbation.  The patient was hemodynamically stable, in no respiratory distress at time of admission.  Dr. Erenest Blank of hospitalist medicine was consulted for admission for observation.   Final Clinical Impression(s) / ED Diagnoses Final diagnoses:  Exacerbation of asthma, unspecified asthma severity, unspecified whether persistent    Rx / DC Orders ED Discharge Orders     None         Ernie Avena, MD 06/14/23 2215

## 2023-06-14 NOTE — ED Notes (Signed)
Pt was given dinner tray.  

## 2023-06-14 NOTE — H&P (Signed)
History and Physical  Rebecca [name]:[id number] DOB: [birth year] DOA: 06/14/2023  PCP: [name], MD   Chief Complaint: cough, SOB   HPI: Rebecca [name] is a 67 y.o. female with medical history significant for hypertension, hyperlipidemia, obesity, asthma being admitted to the hospital with acute exacerbation of asthma.  She presented with worsening cough, shortness of breath in the midst of about 3 weeks of ongoing cough and shortness of breath with wheezing.  She does have a known history of asthma, was seen in pulmonology clinic on 12/20, was seen in the ER a couple of days before that.  She has severe CPAP, wears her CPAP machine every night.  Prior to being seen in pulmonology clinic consult/20, she was started on prednisone after her ER visit.  After the pulmonology visit, she was started on Trelegy, and then Alvesco.  Was instructed to use her albuterol inhaler or neb as needed, prescribed liquid Mucinex DM, and prednisone taper.  She was also prescribed Z-Pak.  She took all of these medications, finished a prednisone taper yesterday.  Despite the steroids, inhaler, and antibiotics, she feels that she has not had any improvement in her symptoms.  Due to this, she came to the ER for evaluation.  In the emergency department, vital signs are stable she did not require oxygen, she had an hour-long albuterol nebulized treatment and feels a little bit better.  Due to ongoing symptoms, hospitalist was contacted for admission.  Review of Systems: Please see HPI for pertinent positives and negatives. A complete 10 system review of systems are otherwise negative.  Past Medical History:  Diagnosis Date   Arthritis    Asthma    Blindness, legal    L EYE   Breast cancer (HCC)    GERD (gastroesophageal reflux disease)    Hearing loss    Hyperlipidemia    Hypertension    Lapband May 2013 11/18/2011   Migraines    Obesity, Class III, BMI 40-49.9 (morbid obesity) (HCC) 06/03/2011    Pre-diabetes    Sleep apnea    uses c-pap   Vertigo    Past Surgical History:  Procedure Laterality Date   ABDOMINAL HYSTERECTOMY  2006   fibroids   BREAST LUMPECTOMY WITH RADIOACTIVE SEED LOCALIZATION Left 04/21/2022   Procedure: LEFT BREAST LUMPECTOMY WITH RADIOACTIVE SEED LOCALIZATION;  Surgeon: Manus Rudd, MD;  Location: [facility];  Service: General;  Laterality: Left;   BREATH TEK H PYLORI  08/17/2011   Procedure: BREATH TEK H PYLORI;  Surgeon: Valarie Merino, MD;  Location: [facility];  Service: General;  Laterality: N/A;  PATIENT WILL COME AT 0715   COLONOSCOPY  2008   @ [facility]    EYE SURGERY     Patient unsure of surgery date. Left eye   KNEE SURGERY Right 1992   right knee arthroscopy   LAPAROSCOPIC GASTRIC BANDING  10/20/2011   Procedure: LAPAROSCOPIC GASTRIC BANDING;  Surgeon: Valarie Merino, MD;  Location: [facility];  Service: General;  Laterality: N/A;   RE-EXCISION OF BREAST LUMPECTOMY Left 05/05/2022   Procedure: RE-EXCISION OF LEFT BREAST LUMPECTOMY SITE POSTERIOR MARGIN;  Surgeon: Manus Rudd, MD;  Location: [facility];  Service: General;  Laterality: Left;   TOTAL KNEE ARTHROPLASTY Right 08/18/2021   Procedure: TOTAL KNEE ARTHROPLASTY;  Surgeon: Ollen Gross, MD;  Location: [facility];  Service: Orthopedics;  Laterality: Right;   TOTAL KNEE REVISION Right 12/28/2022   Procedure: Right knee femoral versus total knee  arthroplasty revision;  Surgeon: Ollen Gross, MD;  Location: [facility];  Service: Orthopedics;  Laterality: Right;   Social History:  reports that she quit smoking about 29 years ago. Her smoking use included cigarettes. She started smoking about 44 years ago. She has a 7.5 pack-year smoking history. She has never used smokeless tobacco. She reports that she does not drink alcohol and does not use drugs.  Allergies  Allergen Reactions   Hydrocodone Hives, Shortness Of Breath and Itching   Sulfa Antibiotics Hives     Family History  Problem Relation Age of Onset   Breast cancer Mother 56   Alcohol abuse Father    Heart disease Father 63       MI   Hypertension Father    Colon cancer Brother        dx after 62   Breast cancer Daughter 80     Prior to Admission medications   Medication Sig Start Date End Date Taking? Authorizing Provider  albuterol (PROVENTIL) (2.5 MG/3ML) 0.083% nebulizer solution Take 3 mLs (2.5 mg total) by nebulization every 4 (four) hours as needed for wheezing or shortness of breath. 06/04/23   Marcelyn Bruins, MD  albuterol (VENTOLIN HFA) 108 (90 Base) MCG/ACT inhaler Use 2 puffs every 4 hours as needed for cough or wheeze.  May use  2 puffs 10-20 minutes prior to exercise. 06/02/23   Nehemiah Settle, FNP  ALVESCO 160 MCG/ACT inhaler Inhale 1 puff into the lungs 2 (two) times daily. 06/11/23   Parrett, Virgel Bouquet, NP  benzonatate (TESSALON) 200 MG capsule Take 1 capsule (200 mg total) by mouth 3 (three) times daily as needed. 06/04/23 06/03/24  Parrett, Virgel Bouquet, NP  cefdinir (OMNICEF) 300 MG capsule Take 1 capsule (300 mg total) by mouth 2 (two) times daily. 06/09/23   Horton, Clabe Seal, DO  guaiFENesin-codeine 100-10 MG/5ML syrup Take 5 mLs by mouth every 12 (twelve) hours as needed for cough. 06/11/23   Parrett, Virgel Bouquet, NP  hydrochlorothiazide (HYDRODIURIL) 12.5 MG tablet Take 1 tablet (12.5 mg total) by mouth daily. 04/21/23   [name], MD  ibuprofen (ADVIL) 800 MG tablet Take 800 mg by mouth 2 (two) times daily as needed. 04/06/23   [provider]  lisinopril (ZESTRIL) 40 MG tablet Take 1 tablet (40 mg total) by mouth daily. 04/21/23   [name], MD  montelukast (SINGULAIR) 10 MG tablet Take 1 tablet (10 mg total) by mouth at bedtime. 06/02/23   Nehemiah Settle, FNP  Olopatadine-Mometasone Cristal Generous) [phone] MCG/ACT SUSP Place 2 sprays in each nostril twice a day as needed for stuffy nose/runny nose 06/02/23   Nehemiah Settle, FNP  potassium  chloride SA (KLOR-CON M) 20 MEQ tablet Take 1 tablet (20 mEq total) by mouth daily. 04/22/23   [name], MD  predniSONE (DELTASONE) 10 MG tablet 4 tabs for 2 days, then 3 tabs for 2 days, 2 tabs for 2 days, then 1 tab for 2 days, then stop 06/04/23   Parrett, Tammy S, NP  tamoxifen (NOLVADEX) 20 MG tablet Take 20 mg by mouth daily.    [provider]  Vitamin D, Ergocalciferol, (DRISDOL) 1.25 MG (50000 UNIT) CAPS capsule Take 1 capsule (50,000 Units total) by mouth every 7 (seven) days. 04/22/23   [name], MD    Physical Exam: BP (!) 124/56 (BP Location: Right Arm)   Pulse (!) 110   Temp 98.2 F (36.8 C) (Oral)   Resp 20  Ht 5\' 1"  (1.549 m)   Wt 86.7 kg   SpO2 95%   BMI 36.10 kg/m  General:  Alert, oriented, calm, in no acute distress, currently no cough, able to speak in full sentences, resting on room air without any distress Cardiovascular: RRR, no murmurs or rubs, no peripheral edema  Respiratory: clear to auscultation bilaterally, good equal bilateral air entry, diffuse rhonchi, no wheezing, no tachypnea, no retractions Abdomen: soft, nontender, nondistended, normal bowel tones heard  Skin: dry, no rashes  Musculoskeletal: no joint effusions, normal range of motion  Psychiatric: appropriate affect, normal speech  Neurologic: extraocular muscles intact, clear speech, moving all extremities with intact sensorium         Labs on Admission:  Basic Metabolic Panel: Recent Labs  Lab 06/09/23 1255 06/14/23 1412  NA 142 139  K 3.7 3.8  CL 108 110  CO2 23 19*  GLUCOSE 101* 132*  BUN 39* 29*  CREATININE 1.30* 1.06*  CALCIUM 9.3 9.0   Liver Function Tests: Recent Labs  Lab 06/09/23 1255  AST 29  ALT 33  ALKPHOS 48  BILITOT 0.4  PROT 7.8  ALBUMIN 4.3   No results for input(s): "LIPASE", "AMYLASE" in the last 168 hours. No results for input(s): "AMMONIA" in the last 168 hours. CBC: Recent Labs  Lab 06/09/23 1255 06/14/23 1412  WBC 15.1*  16.9*  NEUTROABS  --  12.4*  HGB 12.7 12.4  HCT 38.1 37.6  MCV 94.3 94.7  PLT 428* 382   Cardiac Enzymes: No results for input(s): "CKTOTAL", "CKMB", "CKMBINDEX", "TROPONINI" in the last 168 hours. BNP (last 3 results) Recent Labs    06/14/23 1412  BNP 18.8    ProBNP (last 3 results) No results for input(s): "PROBNP" in the last 8760 hours.  CBG: No results for input(s): "GLUCAP" in the last 168 hours.  Radiological Exams on Admission: DG Chest 2 View Result Date: 06/14/2023 CLINICAL DATA:  Shortness of breath. EXAM: CHEST - 2 VIEW COMPARISON:  06/09/2023. FINDINGS: There is homogeneous opacity overlying the left lateral costophrenic angle, which corresponds to prominent epicardial fat pad. Bilateral lung fields are otherwise clear. Bilateral costophrenic angles are clear. Stable cardio-mediastinal silhouette. No acute osseous abnormalities. The soft tissues are within normal limits. IMPRESSION: No active cardiopulmonary disease. Electronically Signed   By: Jules Schick M.D.   On: 06/14/2023 14:39   Assessment/Plan Rebecca [name] is a 67 y.o. female with medical history significant for hypertension, hyperlipidemia, obesity, asthma being admitted to the hospital with acute exacerbation of asthma.   Acute asthma exacerbation-likely diagnosis due to her persistent cough, rhonchi.  Unfortunately she has had very little improvement after completing steroid taper and empiric outpatient antibiotics.  Note recent CT chest which ruled out PE but mention of possible early interstitial lung disease. -Observation admission -Continue Alvesco daily, Singulair -IV Solu-Medrol twice daily -Check viral respiratory panel -No indication for antibiotics at this time -Has scheduled follow-up with Dr. Marchelle Gearing with PFTs, if lack of improvement may benefit from inpatient pulmonology consultation  Hypertension-continue home hydrochlorothiazide, will hold lisinopril and consider discontinuation as  it could be causing chronic cough  Chronic hypokalemia-continue potassium chloride 20 mill equivalents p.o. daily  Leukocytosis-likely due to recent steroids, as stated above no evidence of acute bacterial infection  OSA-continue CPAP at night  Obesity-BMI 36.10, complicating all aspects of care  DVT prophylaxis: Lovenox     Code Status: Full Code  Consults called: None  Admission status: Observation  Time spent: 49 minutes  Jamine Highfill Sharlette Dense MD [facility] Pager [phone]  If 7PM-7AM, please contact night-coverage [email or web address] Password [email or web address]  06/14/2023, 5:30 PM

## 2023-06-14 NOTE — ED Triage Notes (Signed)
BIB EMS due to [facility], hx asthma, seen here 06/09/23 pt coughs resulting in increased SHOB. 140/90-100-98% RA Duo Neb given by EMS prior to EMS Albuterol tx. #22 L Hand 125 mg Solumedrol

## 2023-06-14 NOTE — ED Notes (Signed)
.ED TO INPATIENT HANDOFF REPORT  Name/Age/Gender Rebecca [name] 67 y.o. female  Code Status    Code Status Orders  (From admission, onward)           Start     Ordered   06/14/23 1709  Full code  Continuous       Question:  By:  Answer:  Consent: discussion documented in EHR   06/14/23 1710           Code Status History     Date Active Date Inactive Code Status Order ID Comments User Context   12/28/2022 1640 12/31/2022 2050 Full Code [id number]  Derenda Fennel, [place] Inpatient   08/18/2021 1417 08/21/2021 2248 Full Code [id number]  Alyssa Grove, PA-C Inpatient       Home/SNF/Other Home  Chief Complaint Asthma exacerbation [J45.901]  Level of Care/Admitting Diagnosis ED Disposition     ED Disposition  Admit   Condition  --   Comment  Hospital Area: [facility] [[id number]]  Level of Care: Progressive [102]  Admit to Progressive based on following criteria: RESPIRATORY PROBLEMS hypoxemic/hypercapnic respiratory failure that is responsive to NIPPV (BiPAP) or High Flow Nasal Cannula (6-80 lpm). Frequent assessment/intervention, no > Q2 hrs < Q4 hrs, to maintain oxygenation and pulmonary hygiene.  May place patient in observation at [facility] or [facility] if equivalent level of care is available:: Yes  Covid Evaluation: Confirmed COVID Negative  Diagnosis: Asthma exacerbation [697512]  Admitting Physician: Maryln Gottron [[id number]]  Attending Physician: Kirby Crigler, Parks Neptune [[id number]]          Medical History Past Medical History:  Diagnosis Date   Arthritis    Asthma    Blindness, legal    L EYE   Breast cancer (HCC)    GERD (gastroesophageal reflux disease)    Hearing loss    Hyperlipidemia    Hypertension    Lapband May 2013 11/18/2011   Migraines    Obesity, Class III, BMI 40-49.9 (morbid obesity) (HCC) 06/03/2011   Pre-diabetes    Sleep apnea    uses c-pap   Vertigo     Allergies Allergies  Allergen Reactions    Hydrocodone Hives, Shortness Of Breath and Itching   Lisinopril Cough   Sulfa Antibiotics Hives    IV Location/Drains/Wounds Patient Lines/Drains/Airways Status     Active Line/Drains/Airways     Name Placement date Placement time Site Days   Peripheral IV 06/14/23 22 G 1" Left;Posterior Hand 06/14/23  1243  Hand  less than 1   Peripheral IV 06/14/23 20 G 1.88" Anterior;Right Forearm 06/14/23  1428  Forearm  less than 1            Labs/Imaging Results for orders placed or performed during the hospital encounter of 06/14/23 (from the past 48 hours)  Resp panel by RT-PCR (RSV, Flu A&B, Covid) Anterior Nasal Swab     Status: None   Collection Time: 06/14/23  1:12 PM   Specimen: Anterior Nasal Swab  Result Value Ref Range   SARS Coronavirus 2 by RT PCR NEGATIVE NEGATIVE    Comment: (NOTE) SARS-CoV-2 target nucleic acids are NOT DETECTED.  The SARS-CoV-2 RNA is generally detectable in upper respiratory specimens during the acute phase of infection. The lowest concentration of SARS-CoV-2 viral copies this assay can detect is 138 copies/mL. A negative result does not preclude SARS-Cov-2 infection and should not be used as the sole basis for treatment or other patient management decisions. A negative  result may occur with  improper specimen collection/handling, submission of specimen other than nasopharyngeal swab, presence of viral mutation(s) within the areas targeted by this assay, and inadequate number of viral copies(<138 copies/mL). A negative result must be combined with clinical observations, patient history, and epidemiological information. The expected result is Negative.  Fact Sheet for Patients:  BloggerCourse.com  Fact Sheet for Healthcare Providers:  SeriousBroker.it  This test is no t yet approved or cleared by the [place] FDA and  has been authorized for detection and/or diagnosis of SARS-CoV-2 by FDA  under an Emergency Use Authorization (EUA). This EUA will remain  in effect (meaning this test can be used) for the duration of the COVID-19 declaration under Section 564(b)(1) of the Act, 21 U.S.C.section 360bbb-3(b)(1), unless the authorization is terminated  or revoked sooner.       Influenza A by PCR NEGATIVE NEGATIVE   Influenza B by PCR NEGATIVE NEGATIVE    Comment: (NOTE) The Xpert Xpress SARS-CoV-2/FLU/RSV plus assay is intended as an aid in the diagnosis of influenza from Nasopharyngeal swab specimens and should not be used as a sole basis for treatment. Nasal washings and aspirates are unacceptable for Xpert Xpress SARS-CoV-2/FLU/RSV testing.  Fact Sheet for Patients: BloggerCourse.com  Fact Sheet for Healthcare Providers: SeriousBroker.it  This test is not yet approved or cleared by the [place] FDA and has been authorized for detection and/or diagnosis of SARS-CoV-2 by FDA under an Emergency Use Authorization (EUA). This EUA will remain in effect (meaning this test can be used) for the duration of the COVID-19 declaration under Section 564(b)(1) of the Act, 21 U.S.C. section 360bbb-3(b)(1), unless the authorization is terminated or revoked.     Resp Syncytial Virus by PCR NEGATIVE NEGATIVE    Comment: (NOTE) Fact Sheet for Patients: BloggerCourse.com  Fact Sheet for Healthcare Providers: SeriousBroker.it  This test is not yet approved or cleared by the [place] FDA and has been authorized for detection and/or diagnosis of SARS-CoV-2 by FDA under an Emergency Use Authorization (EUA). This EUA will remain in effect (meaning this test can be used) for the duration of the COVID-19 declaration under Section 564(b)(1) of the Act, 21 U.S.C. section 360bbb-3(b)(1), unless the authorization is terminated or revoked.  Performed at [facility], [address], [place], [place] [address]   CBC with Differential     Status: Abnormal   Collection Time: 06/14/23  2:12 PM  Result Value Ref Range   WBC 16.9 (H) 4.0 - 10.5 K/uL   RBC 3.97 3.87 - 5.11 MIL/uL   Hemoglobin 12.4 12.0 - 15.0 g/dL   HCT 40.1 02.7 - 25.3 %   MCV 94.7 80.0 - 100.0 fL   MCH 31.2 26.0 - 34.0 pg   MCHC 33.0 30.0 - 36.0 g/dL   RDW 66.4 40.3 - 47.4 %   Platelets 382 150 - 400 K/uL   nRBC 0.0 0.0 - 0.2 %   Neutrophils Relative % 74 %   Neutro Abs 12.4 (H) 1.7 - 7.7 K/uL   Lymphocytes Relative 23 %   Lymphs Abs 3.9 0.7 - 4.0 K/uL   Monocytes Relative 2 %   Monocytes Absolute 0.4 0.1 - 1.0 K/uL   Eosinophils Relative 1 %   Eosinophils Absolute 0.1 0.0 - 0.5 K/uL   Basophils Relative 0 %   Basophils Absolute 0.0 0.0 - 0.1 K/uL   Immature Granulocytes 0 %   Abs Immature Granulocytes 0.07 0.00 - 0.07 K/uL  Comment: Performed at [facility], [address], [place], [place] [address]  Basic metabolic panel     Status: Abnormal   Collection Time: 06/14/23  2:12 PM  Result Value Ref Range   Sodium 139 135 - 145 mmol/L   Potassium 3.8 3.5 - 5.1 mmol/L   Chloride 110 98 - 111 mmol/L   CO2 19 (L) 22 - 32 mmol/L   Glucose, Bld 132 (H) 70 - 99 mg/dL    Comment: Glucose reference range applies only to samples taken after fasting for at least 8 hours.   BUN 29 (H) 8 - 23 mg/dL   Creatinine, Ser 5.28 (H) 0.44 - 1.00 mg/dL   Calcium 9.0 8.9 - 41.3 mg/dL   GFR, Estimated 57 (L) >60 mL/min    Comment: (NOTE) Calculated using the CKD-EPI Creatinine Equation (2021)    Anion gap 10 5 - 15    Comment: Performed at [facility], [address], [place], [place] [address]  Brain natriuretic peptide     Status: None   Collection Time: 06/14/23  2:12 PM  Result Value Ref Range   B Natriuretic Peptide 18.8 0.0 - 100.0 pg/mL    Comment: Performed at [facility], [address] [place], [place]  [address]   DG Chest 2 View Result Date: 06/14/2023 CLINICAL DATA:  Shortness of breath. EXAM: CHEST - 2 VIEW COMPARISON:  06/09/2023. FINDINGS: There is homogeneous opacity overlying the left lateral costophrenic angle, which corresponds to prominent epicardial fat pad. Bilateral lung fields are otherwise clear. Bilateral costophrenic angles are clear. Stable cardio-mediastinal silhouette. No acute osseous abnormalities. The soft tissues are within normal limits. IMPRESSION: No active cardiopulmonary disease. Electronically Signed   By: Jules Schick M.D.   On: 06/14/2023 14:39    Pending Labs Unresulted Labs (From admission, onward)     Start     Ordered   06/15/23 0500  Basic metabolic panel  Tomorrow morning,   R       Question:  Specimen collection method  Answer:  IV Team=IV Team collect   06/14/23 1710   06/15/23 0500  CBC  Tomorrow morning,   R       Question:  Specimen collection method  Answer:  IV Team=IV Team collect   06/14/23 1710   06/14/23 1712  Respiratory (~20 pathogens) panel by PCR  (Respiratory panel by PCR (~20 pathogens, ~24 hr TAT)  w precautions)  Once,   R        06/14/23 1711   06/14/23 1709  HIV Antibody (routine testing w rflx)  (HIV Antibody (Routine testing w reflex) panel)  Once,   R       Question:  Specimen collection method  Answer:  IV Team=IV Team collect   06/14/23 1710            Vitals/Pain Today's Vitals   06/14/23 1930 06/14/23 2000 06/14/23 2015 06/14/23 2030  BP: (!) 141/77 (!) 136/116    Pulse: (!) 111 (!) 106 (!) 104 (!) 104  Resp: 17 (!) 24 (!) 25 17  Temp:      TempSrc:      SpO2: 97% 95% 96% 96%  Weight:      Height:      PainSc:        Isolation Precautions Droplet precaution  Medications Medications  hydrochlorothiazide (HYDRODIURIL) tablet 12.5 mg (has no administration in time range)  potassium chloride SA (KLOR-CON M) CR tablet 20 mEq (  has no administration in time range)  ciclesonide (ALVESCO) 160 MCG/ACT inhaler 1  puff (has no administration in time range)  benzonatate (TESSALON) capsule 200 mg (has no administration in time range)  guaiFENesin-codeine 100-10 MG/5ML solution 5 mL (has no administration in time range)  montelukast (SINGULAIR) tablet 10 mg (has no administration in time range)  enoxaparin (LOVENOX) injection 40 mg (40 mg Subcutaneous Given 06/14/23 1834)  methylPREDNISolone sodium succinate (SOLU-MEDROL) 40 mg/mL injection 40 mg (40 mg Intravenous Given 06/14/23 1834)  acetaminophen (TYLENOL) tablet 650 mg (has no administration in time range)    Or  acetaminophen (TYLENOL) suppository 650 mg (has no administration in time range)  traZODone (DESYREL) tablet 25 mg (has no administration in time range)  senna-docusate (Senokot-S) tablet 1 tablet (has no administration in time range)  ondansetron (ZOFRAN) tablet 4 mg (has no administration in time range)    Or  ondansetron (ZOFRAN) injection 4 mg (has no administration in time range)  albuterol (PROVENTIL) (2.5 MG/3ML) 0.083% nebulizer solution 2.5 mg (has no administration in time range)  albuterol (PROVENTIL) (2.5 MG/3ML) 0.083% nebulizer solution (10 mg/hr Nebulization Given 06/14/23 1400)  albuterol (PROVENTIL) (2.5 MG/3ML) 0.083% nebulizer solution (7.5 mg  Given 06/14/23 1419)    Mobility walks with device

## 2023-06-14 NOTE — Plan of Care (Signed)

## 2023-06-15 DIAGNOSIS — H548 Legal blindness, as defined in USA: Secondary | ICD-10-CM | POA: Diagnosis present

## 2023-06-15 DIAGNOSIS — J4541 Moderate persistent asthma with (acute) exacerbation: Secondary | ICD-10-CM | POA: Diagnosis not present

## 2023-06-15 DIAGNOSIS — Z800 Family history of malignant neoplasm of digestive organs: Secondary | ICD-10-CM | POA: Diagnosis not present

## 2023-06-15 DIAGNOSIS — E66813 Obesity, class 3: Secondary | ICD-10-CM | POA: Diagnosis present

## 2023-06-15 DIAGNOSIS — K219 Gastro-esophageal reflux disease without esophagitis: Secondary | ICD-10-CM | POA: Diagnosis present

## 2023-06-15 DIAGNOSIS — N179 Acute kidney failure, unspecified: Secondary | ICD-10-CM | POA: Diagnosis present

## 2023-06-15 DIAGNOSIS — D72829 Elevated white blood cell count, unspecified: Secondary | ICD-10-CM | POA: Diagnosis present

## 2023-06-15 DIAGNOSIS — Z1152 Encounter for screening for COVID-19: Secondary | ICD-10-CM | POA: Diagnosis not present

## 2023-06-15 DIAGNOSIS — R0602 Shortness of breath: Secondary | ICD-10-CM | POA: Diagnosis present

## 2023-06-15 DIAGNOSIS — R052 Subacute cough: Secondary | ICD-10-CM | POA: Diagnosis not present

## 2023-06-15 DIAGNOSIS — Z6836 Body mass index (BMI) 36.0-36.9, adult: Secondary | ICD-10-CM | POA: Diagnosis not present

## 2023-06-15 DIAGNOSIS — R0603 Acute respiratory distress: Secondary | ICD-10-CM | POA: Diagnosis present

## 2023-06-15 DIAGNOSIS — M1990 Unspecified osteoarthritis, unspecified site: Secondary | ICD-10-CM | POA: Diagnosis present

## 2023-06-15 DIAGNOSIS — Z96651 Presence of right artificial knee joint: Secondary | ICD-10-CM | POA: Diagnosis present

## 2023-06-15 DIAGNOSIS — K2100 Gastro-esophageal reflux disease with esophagitis, without bleeding: Secondary | ICD-10-CM | POA: Diagnosis not present

## 2023-06-15 DIAGNOSIS — Z8249 Family history of ischemic heart disease and other diseases of the circulatory system: Secondary | ICD-10-CM | POA: Diagnosis not present

## 2023-06-15 DIAGNOSIS — E872 Acidosis, unspecified: Secondary | ICD-10-CM | POA: Diagnosis present

## 2023-06-15 DIAGNOSIS — Z79899 Other long term (current) drug therapy: Secondary | ICD-10-CM | POA: Diagnosis not present

## 2023-06-15 DIAGNOSIS — E119 Type 2 diabetes mellitus without complications: Secondary | ICD-10-CM | POA: Diagnosis present

## 2023-06-15 DIAGNOSIS — H9190 Unspecified hearing loss, unspecified ear: Secondary | ICD-10-CM | POA: Diagnosis present

## 2023-06-15 DIAGNOSIS — I10 Essential (primary) hypertension: Secondary | ICD-10-CM | POA: Diagnosis present

## 2023-06-15 DIAGNOSIS — J45901 Unspecified asthma with (acute) exacerbation: Secondary | ICD-10-CM | POA: Diagnosis not present

## 2023-06-15 DIAGNOSIS — E876 Hypokalemia: Secondary | ICD-10-CM | POA: Diagnosis present

## 2023-06-15 DIAGNOSIS — J4551 Severe persistent asthma with (acute) exacerbation: Secondary | ICD-10-CM | POA: Diagnosis present

## 2023-06-15 DIAGNOSIS — T380X5A Adverse effect of glucocorticoids and synthetic analogues, initial encounter: Secondary | ICD-10-CM | POA: Diagnosis present

## 2023-06-15 DIAGNOSIS — Z803 Family history of malignant neoplasm of breast: Secondary | ICD-10-CM | POA: Diagnosis not present

## 2023-06-15 DIAGNOSIS — E785 Hyperlipidemia, unspecified: Secondary | ICD-10-CM | POA: Diagnosis present

## 2023-06-15 DIAGNOSIS — G4733 Obstructive sleep apnea (adult) (pediatric): Secondary | ICD-10-CM | POA: Diagnosis present

## 2023-06-15 LAB — BASIC METABOLIC PANEL
Anion gap: 7 (ref 5–15)
BUN: 28 mg/dL — ABNORMAL HIGH (ref 8–23)
CO2: 18 mmol/L — ABNORMAL LOW (ref 22–32)
Calcium: 8.5 mg/dL — ABNORMAL LOW (ref 8.9–10.3)
Chloride: 110 mmol/L (ref 98–111)
Creatinine, Ser: 0.76 mg/dL (ref 0.44–1.00)
GFR, Estimated: >60 mL/min (ref >60)
Glucose, Bld: 192 mg/dL — ABNORMAL HIGH (ref 70–99)
Potassium: 4.3 mmol/L (ref 3.5–5.1)
Sodium: 135 mmol/L (ref 135–145)

## 2023-06-15 LAB — CBC
HCT: 34.6 % — ABNORMAL LOW (ref 36.0–46.0)
Hemoglobin: 11.2 g/dL — ABNORMAL LOW (ref 12.0–15.0)
MCH: 30.7 pg (ref 26.0–34.0)
MCHC: 32.4 g/dL (ref 30.0–36.0)
MCV: 94.8 fL (ref 80.0–100.0)
Platelets: 372 10*3/uL (ref 150–400)
RBC: 3.65 MIL/uL — ABNORMAL LOW (ref 3.87–5.11)
RDW: 13.6 % (ref 11.5–15.5)
WBC: 16.4 10*3/uL — ABNORMAL HIGH (ref 4.0–10.5)
nRBC: 0 % (ref 0.0–0.2)

## 2023-06-15 LAB — HIV ANTIBODY (ROUTINE TESTING W REFLEX): HIV Screen 4th Generation wRfx: NONREACTIVE

## 2023-06-15 MED ORDER — SODIUM CHLORIDE (PF) 0.9 % IJ SOLN
INTRAMUSCULAR | Status: AC
  Filled 2023-06-15: qty 20

## 2023-06-15 MED ORDER — GUAIFENESIN ER 600 MG PO TB12
600.0000 mg | ORAL_TABLET | Freq: Two times a day (BID) | ORAL | Status: DC
  Administered 2023-06-15 – 2023-06-23 (×17): 600 mg via ORAL
  Filled 2023-06-15 (×17): qty 1

## 2023-06-15 MED ORDER — REVEFENACIN 175 MCG/3ML IN SOLN
175.0000 ug | Freq: Every day | RESPIRATORY_TRACT | Status: DC
Start: 2023-06-15 — End: 2023-06-19
  Administered 2023-06-16 – 2023-06-19 (×4): 175 ug via RESPIRATORY_TRACT
  Filled 2023-06-15 (×6): qty 3

## 2023-06-15 MED ORDER — MAGNESIUM SULFATE 2 GM/50ML IV SOLN
2.0000 g | Freq: Once | INTRAVENOUS | Status: AC
  Administered 2023-06-15: 2 g via INTRAVENOUS
  Filled 2023-06-15: qty 50

## 2023-06-15 MED ORDER — PROMETHAZINE-PHENYLEPHRINE 6.25-5 MG/5ML PO SYRP: 5.0000 mL | ORAL_SOLUTION | ORAL | Status: DC | PRN

## 2023-06-15 MED ORDER — ARFORMOTEROL TARTRATE 15 MCG/2ML IN NEBU
15.0000 ug | INHALATION_SOLUTION | Freq: Two times a day (BID) | RESPIRATORY_TRACT | Status: DC
  Administered 2023-06-15 – 2023-06-19 (×9): 15 ug via RESPIRATORY_TRACT
  Filled 2023-06-15 (×10): qty 2

## 2023-06-15 MED ORDER — TAMOXIFEN CITRATE 10 MG PO TABS
20.0000 mg | ORAL_TABLET | Freq: Every day | ORAL | Status: DC
  Administered 2023-06-15 – 2023-06-23 (×9): 20 mg via ORAL
  Filled 2023-06-15 (×9): qty 2

## 2023-06-15 MED ORDER — AMLODIPINE BESYLATE 10 MG PO TABS
5.0000 mg | ORAL_TABLET | Freq: Every day | ORAL | Status: DC
  Administered 2023-06-15 – 2023-06-16 (×2): 5 mg via ORAL
  Filled 2023-06-15 (×2): qty 1

## 2023-06-15 MED ORDER — PANTOPRAZOLE SODIUM 40 MG PO TBEC
40.0000 mg | DELAYED_RELEASE_TABLET | Freq: Every day | ORAL | Status: DC
  Administered 2023-06-15 – 2023-06-16 (×2): 40 mg via ORAL
  Filled 2023-06-15 (×2): qty 1

## 2023-06-15 MED ORDER — GUAIFENESIN-DM 100-10 MG/5ML PO SYRP
5.0000 mL | ORAL_SOLUTION | ORAL | Status: DC | PRN
  Administered 2023-06-15 – 2023-06-21 (×10): 5 mL via ORAL
  Filled 2023-06-15: qty 10
  Filled 2023-06-15: qty 5
  Filled 2023-06-15 (×3): qty 10
  Filled 2023-06-15 (×3): qty 5
  Filled 2023-06-15 (×3): qty 10

## 2023-06-15 MED ORDER — BENZONATATE 100 MG PO CAPS
200.0000 mg | ORAL_CAPSULE | Freq: Three times a day (TID) | ORAL | Status: DC | PRN
  Administered 2023-06-15 – 2023-06-17 (×5): 200 mg via ORAL
  Filled 2023-06-15 (×5): qty 2

## 2023-06-15 MED ORDER — BUPIVACAINE LIPOSOME 1.3 % IJ SUSP
INTRAMUSCULAR | Status: AC
  Filled 2023-06-15: qty 20

## 2023-06-15 MED ORDER — METHYLPREDNISOLONE SODIUM SUCC 125 MG IJ SOLR
80.0000 mg | INTRAMUSCULAR | Status: DC
  Administered 2023-06-16 – 2023-06-19 (×4): 80 mg via INTRAVENOUS
  Filled 2023-06-15 (×4): qty 2

## 2023-06-15 MED ORDER — HYDROXYZINE HCL 25 MG PO TABS
25.0000 mg | ORAL_TABLET | Freq: Three times a day (TID) | ORAL | Status: DC | PRN
  Administered 2023-06-19 – 2023-06-22 (×4): 25 mg via ORAL
  Filled 2023-06-15 (×4): qty 1

## 2023-06-15 NOTE — Progress Notes (Signed)
   06/15/23 0010  BiPAP/CPAP/SIPAP  Auto Titrate (S)  Yes (<4/>12)   Pressure adjusted per pt. requests.

## 2023-06-15 NOTE — Telephone Encounter (Signed)
Patient seen [facility] on 06/04/2023. It looks like she is back in the hospital. I made Wynona [place]es [place],NP aware verbally.

## 2023-06-15 NOTE — Progress Notes (Signed)
 Mobility Specialist - Progress Note   06/15/23 1024  Oxygen Therapy  SpO2 93 %  O2 Device Room Air  Patient Activity (if Appropriate) Ambulating  Mobility  Activity Ambulated independently in hallway  Level of Assistance Independent  Assistive Device None  Distance Ambulated (ft) 120 ft  Activity Response Tolerated well  Mobility Referral Yes  Mobility visit 1 Mobility  Mobility Specialist Start Time (ACUTE ONLY) 1017  Mobility Specialist Stop Time (ACUTE ONLY) 1043  Mobility Specialist Time Calculation (min) (ACUTE ONLY) 26 min   Pt received in bed and agreeable to mobility. Pt had some SOB throughout session but still eager to ambulate. Pt to bed after session with all needs met.    During mobility: 113 HR, 93% SpO2  Chief Technology Officer

## 2023-06-15 NOTE — Progress Notes (Signed)
 PROGRESS NOTE  Rebecca APPELHANS FMW:[id number] DOB: 12-Aug-1955   PCP: [name], MD  Patient is from: Home  DOA: 06/14/2023 LOS: 0  Chief complaints Chief Complaint  Patient presents with   Shortness of Breath     Brief Narrative / Interim history: 67 year old F with PMH of asthma, breast cancer, HTN, HLD, obesity, OSA on CPAP.  And possible ILD presenting with increased shortness of breath and cough for about 3 weeks that has not improved after outpatient steroid taper, Zithromax  and breathing treatments, and admitted with acute asthma exacerbation.  Recent CT angio chest on 12/18 raise concern for early ILD.  In ED, vital stable.  No oxygen requirement but respiratory distress from severe cough and wheezing.  COVID-19, influenza and RSV PCR nonreactive.  CXR without acute finding.  Started on systemic steroid and breathing treatments.    Subjective: Seen and examined earlier this morning.  No major events overnight of this morning.  Continues to endorse dry cough, wheeze and shortness of breath.  Denies chest pain.  Denies history of reflux or heartburn.  Has history of allergies.   Objective: Vitals:   06/15/23 0502 06/15/23 0813 06/15/23 0845 06/15/23 1024  BP: (!) 143/72 (!) 141/67    Pulse: 88 99    Resp: 18 20    Temp: 98.1 F (36.7 C) 98.4 F (36.9 C)    TempSrc: Oral Oral    SpO2: 93% 97% 96% 93%  Weight:      Height:        Examination:  GENERAL: No apparent distress.  Nontoxic. HEENT: MMM.  Vision and hearing grossly intact.  NECK: Supple.  No apparent JVD.  RESP:  No IWOB.  Fair aeration bilaterally.  Symptom of upper respiratory symptoms CVS:  RRR. Heart sounds normal.  ABD/GI/GU: BS+. Abd soft, NTND.  MSK/EXT:  Moves extremities. No apparent deformity. No edema.  SKIN: no apparent skin lesion or wound NEURO: Awake, alert and oriented appropriately.  No apparent focal neuro deficit. PSYCH: Calm. Normal affect.   Procedures:   None  Microbiology summarized: COVID-19, influenza and RSV PCR nonreactive Full RVP nonreactive  Assessment and plan: Acute asthma exacerbation: Still with persistent cough and rhonchi.  Ongoing respiratory distress with cough and shortness of breath for 3 weeks despite outpatient steroid taper and breathing treatments.  Also completed empiric Zithromax  outpatient.  She is followed by pulmonology.  Outpatient CT on 12/18 raises concern for early ILD.  She is on ACE inhibitor's but has not taken this in 5 days.  Denies heartburn or reflux.  Has history of allergies.  BNP within normal.  Denies smoking -Consolidated Solu-Medrol  to 80 mg daily -Continue Pulmicort .  Add Brovana  and Yupelri  -Avoid ACE inhibitors. -Mucolytic's and antitussive  -Add p.o. Protonix  -Needs to optimize inhalers on discharge. -Pulmonology consult.  Has scheduled follow-up with Dr. Geronimo with PFTs as well  AKI: Resolved.   Essential hypertension: BP slightly elevated. -Continue HCTZ -Add amlodipine .  Discontinue lisinopril  indefinitely   Hypokalemia: Resolved.   Leukocytosis-likely due to recent steroids   OSA on CPAP -Continue CPAP  History of breast cancer -Continue home med  Class II obesity Body mass index is 36.1 kg/m. -Encourage lifestyle change to lose weight         DVT prophylaxis:  enoxaparin  (LOVENOX ) injection 40 mg Start: 06/14/23 1800 SCDs Start: 06/14/23 1709  Code Status: Full code Family Communication: None at bedside Level of care: Progressive Status is: Observation The patient will require care spanning >  2 midnights and should be moved to inpatient because: Asthma exacerbation with significant respiratory distress   Final disposition: Home once medically stable Consultants:  Pulmonology  55 minutes with more than 50% spent in reviewing records, counseling patient/family and coordinating care.   Sch Meds:  Scheduled Meds:  arformoterol   15 mcg Nebulization BID    budesonide  (PULMICORT ) nebulizer solution  0.5 mg Nebulization BID   enoxaparin  (LOVENOX ) injection  40 mg Subcutaneous Q24H   guaiFENesin   600 mg Oral BID   hydrochlorothiazide   12.5 mg Oral Daily   [START ON 06/16/2023] methylPREDNISolone  (SOLU-MEDROL ) injection  80 mg Intravenous Q24H   montelukast   10 mg Oral QHS   pantoprazole   40 mg Oral Daily   potassium chloride  SA  20 mEq Oral Daily   revefenacin   175 mcg Nebulization Daily   tamoxifen   20 mg Oral Daily   Continuous Infusions: PRN Meds:.acetaminophen  **OR** acetaminophen , albuterol , guaiFENesin -dextromethorphan , hydrOXYzine , ondansetron  **OR** ondansetron  (ZOFRAN ) IV, senna-docusate, traZODone   Antimicrobials: Anti-infectives (From admission, onward)    None        I have personally reviewed the following labs and images: CBC: Recent Labs  Lab 06/09/23 1255 06/14/23 1412 06/15/23 0334  WBC 15.1* 16.9* 16.4*  NEUTROABS  --  12.4*  --   HGB 12.7 12.4 11.2*  HCT 38.1 37.6 34.6*  MCV 94.3 94.7 94.8  PLT 428* 382 372   BMP &GFR Recent Labs  Lab 06/09/23 1255 06/14/23 1412 06/15/23 0334  NA 142 139 135  K 3.7 3.8 4.3  CL 108 110 110  CO2 23 19* 18*  GLUCOSE 101* 132* 192*  BUN 39* 29* 28*  CREATININE 1.30* 1.06* 0.76  CALCIUM  9.3 9.0 8.5*   Estimated Creatinine Clearance: 66.4 mL/min (by C-G formula based on SCr of 0.76 mg/dL). Liver & Pancreas: Recent Labs  Lab 06/09/23 1255  AST 29  ALT 33  ALKPHOS 48  BILITOT 0.4  PROT 7.8  ALBUMIN 4.3   No results for input(s): LIPASE, AMYLASE in the last 168 hours. No results for input(s): AMMONIA in the last 168 hours. Diabetic: No results for input(s): HGBA1C in the last 72 hours. No results for input(s): GLUCAP in the last 168 hours. Cardiac Enzymes: No results for input(s): CKTOTAL, CKMB, CKMBINDEX, TROPONINI in the last 168 hours. No results for input(s): PROBNP in the last 8760 hours. Coagulation Profile: No results for input(s):  INR, PROTIME in the last 168 hours. Thyroid  Function Tests: No results for input(s): TSH, T4TOTAL, FREET4, T3FREE, THYROIDAB in the last 72 hours. Lipid Profile: No results for input(s): CHOL, HDL, LDLCALC, TRIG, CHOLHDL, LDLDIRECT in the last 72 hours. Anemia Panel: No results for input(s): VITAMINB12, FOLATE, FERRITIN, TIBC, IRON, RETICCTPCT in the last 72 hours. Urine analysis:    Component Value Date/Time   COLORURINE STRAW (A) 04/19/2021 0200   APPEARANCEUR CLEAR 04/19/2021 0200   LABSPEC 1.006 04/19/2021 0200   PHURINE 7.0 04/19/2021 0200   GLUCOSEU NEGATIVE 04/19/2021 0200   HGBUR NEGATIVE 04/19/2021 0200   BILIRUBINUR NEGATIVE 04/19/2021 0200   BILIRUBINUR neg 04/05/2020 1513   KETONESUR NEGATIVE 04/19/2021 0200   PROTEINUR NEGATIVE 04/19/2021 0200   UROBILINOGEN 0.2 04/05/2020 1513   NITRITE NEGATIVE 04/19/2021 0200   LEUKOCYTESUR MODERATE (A) 04/19/2021 0200   Sepsis Labs: Invalid input(s): PROCALCITONIN, LACTICIDVEN  Microbiology: Recent Results (from the past 240 hours)  Resp panel by RT-PCR (RSV, Flu A&B, Covid) Anterior Nasal Swab     Status: None   Collection Time: 06/14/23  1:12 PM  Specimen: Anterior Nasal Swab  Result Value Ref Range Status   SARS Coronavirus 2 by RT PCR NEGATIVE NEGATIVE Final    Comment: (NOTE) SARS-CoV-2 target nucleic acids are NOT DETECTED.  The SARS-CoV-2 RNA is generally detectable in upper respiratory specimens during the acute phase of infection. The lowest concentration of SARS-CoV-2 viral copies this assay can detect is 138 copies/mL. A negative result does not preclude SARS-Cov-2 infection and should not be used as the sole basis for treatment or other patient management decisions. A negative result may occur with  improper specimen collection/handling, submission of specimen other than nasopharyngeal swab, presence of viral mutation(s) within the areas targeted by this assay, and  inadequate number of viral copies(<138 copies/mL). A negative result must be combined with clinical observations, patient history, and epidemiological information. The expected result is Negative.  Fact Sheet for Patients:  bloggercourse.com  Fact Sheet for Healthcare Providers:  seriousbroker.it  This test is no t yet approved or cleared by the [place]  FDA and  has been authorized for detection and/or diagnosis of SARS-CoV-2 by FDA under an Emergency Use Authorization (EUA). This EUA will remain  in effect (meaning this test can be used) for the duration of the COVID-19 declaration under Section 564(b)(1) of the Act, 21 U.S.C.section 360bbb-3(b)(1), unless the authorization is terminated  or revoked sooner.       Influenza A by PCR NEGATIVE NEGATIVE Final   Influenza B by PCR NEGATIVE NEGATIVE Final    Comment: (NOTE) The Xpert Xpress SARS-CoV-2/FLU/RSV plus assay is intended as an aid in the diagnosis of influenza from Nasopharyngeal swab specimens and should not be used as a sole basis for treatment. Nasal washings and aspirates are unacceptable for Xpert Xpress SARS-CoV-2/FLU/RSV testing.  Fact Sheet for Patients: bloggercourse.com  Fact Sheet for Healthcare Providers: seriousbroker.it  This test is not yet approved or cleared by the [place]  FDA and has been authorized for detection and/or diagnosis of SARS-CoV-2 by FDA under an Emergency Use Authorization (EUA). This EUA will remain in effect (meaning this test can be used) for the duration of the COVID-19 declaration under Section 564(b)(1) of the Act, 21 U.S.C. section 360bbb-3(b)(1), unless the authorization is terminated or revoked.     Resp Syncytial Virus by PCR NEGATIVE NEGATIVE Final    Comment: (NOTE) Fact Sheet for Patients: bloggercourse.com  Fact Sheet for Healthcare  Providers: seriousbroker.it  This test is not yet approved or cleared by the [place]  FDA and has been authorized for detection and/or diagnosis of SARS-CoV-2 by FDA under an Emergency Use Authorization (EUA). This EUA will remain in effect (meaning this test can be used) for the duration of the COVID-19 declaration under Section 564(b)(1) of the Act, 21 U.S.C. section 360bbb-3(b)(1), unless the authorization is terminated or revoked.  Performed at [facility], [address], [place], [place] [address]   Respiratory (~20 pathogens) panel by PCR     Status: None   Collection Time: 06/14/23  7:14 PM   Specimen: Nasopharyngeal Swab; Respiratory  Result Value Ref Range Status   Adenovirus NOT DETECTED NOT DETECTED Final   Coronavirus 229E NOT DETECTED NOT DETECTED Final    Comment: (NOTE) The Coronavirus on the Respiratory Panel, DOES NOT test for the novel  Coronavirus (2019 nCoV)    Coronavirus HKU1 NOT DETECTED NOT DETECTED Final   Coronavirus NL63 NOT DETECTED NOT DETECTED Final   Coronavirus OC43 NOT DETECTED NOT DETECTED Final   Metapneumovirus NOT DETECTED NOT DETECTED Final  Rhinovirus / Enterovirus NOT DETECTED NOT DETECTED Final   Influenza A NOT DETECTED NOT DETECTED Final   Influenza B NOT DETECTED NOT DETECTED Final   Parainfluenza Virus 1 NOT DETECTED NOT DETECTED Final   Parainfluenza Virus 2 NOT DETECTED NOT DETECTED Final   Parainfluenza Virus 3 NOT DETECTED NOT DETECTED Final   Parainfluenza Virus 4 NOT DETECTED NOT DETECTED Final   Respiratory Syncytial Virus NOT DETECTED NOT DETECTED Final   Bordetella pertussis NOT DETECTED NOT DETECTED Final   Bordetella Parapertussis NOT DETECTED NOT DETECTED Final   Chlamydophila pneumoniae NOT DETECTED NOT DETECTED Final   Mycoplasma pneumoniae NOT DETECTED NOT DETECTED Final    Comment: Performed at [facility], [address] [place], [place] [address]     Radiology Studies: DG Chest 2 View Result Date: 06/14/2023 CLINICAL DATA:  Shortness of breath. EXAM: CHEST - 2 VIEW COMPARISON:  06/09/2023. FINDINGS: There is homogeneous opacity overlying the left lateral costophrenic angle, which corresponds to prominent epicardial fat pad. Bilateral lung fields are otherwise clear. Bilateral costophrenic angles are clear. Stable cardio-mediastinal silhouette. No acute osseous abnormalities. The soft tissues are within normal limits. IMPRESSION: No active cardiopulmonary disease. Electronically Signed   By: Ree Molt M.D.   On: 06/14/2023 14:39      Caileigh Canche T. Khaiden Segreto [facility]  If 7PM-7AM, please contact night-coverage [email or web address] 06/15/2023, 12:52 PM

## 2023-06-15 NOTE — Consult Note (Signed)
 NAME:  Rebecca [name], MRN:  [id number], DOB:  09-12-1955, LOS: 0 ADMISSION DATE:  06/14/2023, CONSULTATION DATE:  06/15/23 REFERRING MD:  Kathrin- [facility], CHIEF COMPLAINT:  SOB    History of Present Illness:  67 yo F PMH HLD, HTN, OSA on CPAP, asthma, breast cancer s/p L lumpectomy and radiation (05/2022-06/2022) now on tamoxifen , who presented to [facility] 12/30 with CC cough and SOB. Cough/SOB has been ongoing for about 2.5- 3 weeks and she has seen multiple providers in this time span-- course detailed below.   12/18: She was seen in allergy clinic 06/02/23 with CC asthma flare starting a few days prior to office visit, associated productive cough, and a bit of chest pain.   Steroids were prescribed, a CXR and ddimer were obtained.  Cxr revealed some RUL densities and her ddimer was elevated to 2, so she was referred to ED for further evaluation and imaging. A CTA chest 06/02/23 was obtained in ED which was fortunately negative for PE but had some GGOs which could be suggestive of early interstitial lung disease. She was not having any s/sx indicating need for hospital admission and was dc home.   12/20: She was seen in follow up in pulm clinic 06/04/23 and in context of a recent/ongoing slow-to-resolve asthma flare, difficult to say. Was started on a Z pack and a steroid taper, with plan for return visit in 6-8 weeks+ PFTs.   12/25: She presented to ED 12/25 with cough and SOB + wheezing. Had finished her Z pack, was still taking prednisone , and felt that home nebs/MDI (albuteral, alvesco ) were not helping. CXR without She was rx cough meds and cefdinir , dc home.   12/27: She called pulm office 12/27 with report of ongoing cough. Add'l cough syrup was called in and she was advised to call office to schedule follow up sooner than previously planned.   12/30 (Current) She presented to [facility] ED 12/30 with CC cough, SOB. At this point she had completed a prednisone  taper and had completed azithro.   Reports  completing  cefdinir . She was wheezing in ED. Given IV steroids and duoneb with EMS, placed on cont neb for 1hr in ED. CXR without acute abnormality. She was admitted to [facility] to hospitalist service, Rx solumedrol. Is not requiring O2.   PCCM is consulted 06/15/23 in this setting    Pertinent  Medical History  OSA Asthma  Breast cancer  HTN  Significant Hospital Events: Including procedures, antibiotic start and stop dates in addition to other pertinent events   12/30 wheezing, admit to [facility]. BID steroids  12/31 PCCM consult   Interim History / Subjective:  Says she feels better than yesterday   Describes coughing jags with incessant mucous production   Objective   Blood pressure (!) 141/67, pulse 99, temperature 98.4 F (36.9 C), temperature source Oral, resp. rate 20, height 5' 1 (1.549 m), weight 86.7 kg, SpO2 93%.    FiO2 (%):  [21 %] 21 %   Intake/Output Summary (Last 24 hours) at 06/15/2023 1409 Last data filed at 06/15/2023 9185 Gross per 24 hour  Intake 480 ml  Output --  Net 480 ml   Filed Weights   06/14/23 1258  Weight: 86.7 kg    Examination: General: chronically ill obese older adult F NAD  HENT: NCAT pink mm Lungs: Symmetrical chest expansion. Lung sounds are obscured by patient phonating wheezing-like-sounds and some throat clearing noises.  Cardiovascular: cap refill < 3 sec  Abdomen: obese soft  Extremities: no acute joint deformity  Neuro: AAOx4 GU: defer   Resolved Hospital Problem list     Assessment & Plan:   Abnormal CT chest  // GGOs (06/02/23)  Asthma  Cough  OSA - very good home CPAP compliance  -CTA chest 12/18 with some GGOs which could be reflective of early ILD, in context of active asthma exacerbation difficult to delineate. Personally reviewed.   -CXRs have not been revealing for acute process. Personally reviewed.  -RVP, Covid, Flu are neg this admission -had course of azithro outpt and cefdinir  as well as pred taper, without  improvement - feeling a little better after IV steroid initiation but overall about the same as the last few weeks  -overall, not sure if this is slow to resolve asthma flare / bronchitis? Vs post viral syndrome? Reportedly a lot of mucus production, though none when I saw her 12/31 P -cont solumedrol  -2g mag empirically  -sputum cx for completeness  -cont triple therapy -- closer to dc can readdress these -monteleukast  -adding tessalon  to current cough reg -IS, pulm hygiene, mobility   -agree w  PPI  -agree w holding lisinopril  as this could be chronic cough catalyst (not sure this explains above however) -CPAP    Best Practice (right click and Reselect all SmartList Selections daily)   Per primary  Labs   CBC: Recent Labs  Lab 06/09/23 1255 06/14/23 1412 06/15/23 0334  WBC 15.1* 16.9* 16.4*  NEUTROABS  --  12.4*  --   HGB 12.7 12.4 11.2*  HCT 38.1 37.6 34.6*  MCV 94.3 94.7 94.8  PLT 428* 382 372    Basic Metabolic Panel: Recent Labs  Lab 06/09/23 1255 06/14/23 1412 06/15/23 0334  NA 142 139 135  K 3.7 3.8 4.3  CL 108 110 110  CO2 23 19* 18*  GLUCOSE 101* 132* 192*  BUN 39* 29* 28*  CREATININE 1.30* 1.06* 0.76  CALCIUM  9.3 9.0 8.5*   GFR: Estimated Creatinine Clearance: 66.4 mL/min (by C-G formula based on SCr of 0.76 mg/dL). Recent Labs  Lab 06/09/23 1255 06/14/23 1412 06/15/23 0334  WBC 15.1* 16.9* 16.4*    Liver Function Tests: Recent Labs  Lab 06/09/23 1255  AST 29  ALT 33  ALKPHOS 48  BILITOT 0.4  PROT 7.8  ALBUMIN 4.3   No results for input(s): LIPASE, AMYLASE in the last 168 hours. No results for input(s): AMMONIA in the last 168 hours.  ABG No results found for: PHART, PCO2ART, PO2ART, HCO3, TCO2, ACIDBASEDEF, O2SAT   Coagulation Profile: No results for input(s): INR, PROTIME in the last 168 hours.  Cardiac Enzymes: No results for input(s): CKTOTAL, CKMB, CKMBINDEX, TROPONINI in the last 168  hours.  HbA1C: Hgb A1c MFr Bld  Date/Time Value Ref Range Status  04/21/2023 11:43 AM 6.1 4.6 - 6.5 % Final    Comment:    Glycemic Control Guidelines for People with Diabetes:Non Diabetic:  <6%Goal of Therapy: <7%Additional Action Suggested:  >8%   07/10/2021 09:46 AM 5.9 4.6 - 6.5 % Final    Comment:    Glycemic Control Guidelines for People with Diabetes:Non Diabetic:  <6%Goal of Therapy: <7%Additional Action Suggested:  >8%     CBG: No results for input(s): GLUCAP in the last 168 hours.  Review of Systems:   Review of Systems  Constitutional:  Positive for malaise/fatigue.  HENT:  Negative for congestion, sinus pain and sore throat.   Respiratory:  Positive for cough, sputum production, shortness of breath  and wheezing. Negative for hemoptysis and stridor.   Cardiovascular:  Positive for chest pain. Negative for palpitations, orthopnea and leg swelling.  Gastrointestinal: Negative.   Skin: Negative.   Psychiatric/Behavioral:  The patient has insomnia.     Past Medical History:  She,  has a past medical history of Arthritis, Asthma, Blindness, legal, Breast cancer (HCC), GERD (gastroesophageal reflux disease), Hearing loss, Hyperlipidemia, Hypertension, Lapband May 2013 (11/18/2011), Migraines, Obesity, Class III, BMI 40-49.9 (morbid obesity) (HCC) (06/03/2011), Pre-diabetes, Sleep apnea, and Vertigo.   Surgical History:   Past Surgical History:  Procedure Laterality Date   ABDOMINAL HYSTERECTOMY  2006   fibroids   BREAST LUMPECTOMY WITH RADIOACTIVE SEED LOCALIZATION Left 04/21/2022   Procedure: LEFT BREAST LUMPECTOMY WITH RADIOACTIVE SEED LOCALIZATION;  Surgeon: Belinda Cough, MD;  Location: [facility];  Service: General;  Laterality: Left;   BREATH TEK H PYLORI  08/17/2011   Procedure: BREATH TEK H PYLORI;  Surgeon: Cough KATHEE Lunger, MD;  Location: [facility];  Service: General;  Laterality: N/A;  PATIENT WILL COME AT 0715   COLONOSCOPY  2008   @  [facility]    EYE SURGERY     Patient unsure of surgery date. Left eye   KNEE SURGERY Right 1992   right knee arthroscopy   LAPAROSCOPIC GASTRIC BANDING  10/20/2011   Procedure: LAPAROSCOPIC GASTRIC BANDING;  Surgeon: Cough KATHEE Lunger, MD;  Location: [facility];  Service: General;  Laterality: N/A;   RE-EXCISION OF BREAST LUMPECTOMY Left 05/05/2022   Procedure: RE-EXCISION OF LEFT BREAST LUMPECTOMY SITE POSTERIOR MARGIN;  Surgeon: Belinda Cough, MD;  Location: [facility];  Service: General;  Laterality: Left;   TOTAL KNEE ARTHROPLASTY Right 08/18/2021   Procedure: TOTAL KNEE ARTHROPLASTY;  Surgeon: Melodi Lerner, MD;  Location: [facility];  Service: Orthopedics;  Laterality: Right;   TOTAL KNEE REVISION Right 12/28/2022   Procedure: Right knee femoral versus total knee arthroplasty revision;  Surgeon: Melodi Lerner, MD;  Location: [facility];  Service: Orthopedics;  Laterality: Right;     Social History:   reports that she quit smoking about 29 years ago. Her smoking use included cigarettes. She started smoking about 44 years ago. She has a 7.5 pack-year smoking history. She has never used smokeless tobacco. She reports that she does not drink alcohol and does not use drugs.   Family History:  Her family history includes Alcohol abuse in her father; Breast cancer (age of onset: 56) in her mother; Breast cancer (age of onset: 31) in her daughter; Colon cancer in her brother; Heart disease (age of onset: 18) in her father; Hypertension in her father.   Allergies Allergies  Allergen Reactions   Hydrocodone Hives, Shortness Of Breath and Itching   Lisinopril  Cough   Sulfa Antibiotics Hives     Home Medications  Prior to Admission medications   Medication Sig Start Date End Date Taking? Authorizing Provider  albuterol  (PROVENTIL ) (2.5 MG/3ML) 0.083% nebulizer solution Take 3 mLs (2.5 mg total) by nebulization every 4 (four) hours as needed for wheezing or shortness of breath. 06/04/23  Yes  Padgett, Danita Macintosh, MD  albuterol  (VENTOLIN  HFA) 108 (90 Base) MCG/ACT inhaler Use 2 puffs every 4 hours as needed for cough or wheeze.  May use  2 puffs 10-20 minutes prior to exercise. Patient taking differently: Inhale 2 puffs into the lungs every 4 (four) hours as needed for wheezing or shortness of breath (or coughing and may also use 2 puffs 10 to 20 minutes prior  to exercise). 06/02/23  Yes Cheryl Reusing, FNP  benzonatate  (TESSALON ) 200 MG capsule Take 1 capsule (200 mg total) by mouth 3 (three) times daily as needed. Patient taking differently: Take 200 mg by mouth 3 (three) times daily as needed for cough. 06/04/23 06/03/24 Yes Parrett, Tammy S, NP  guaiFENesin -codeine  100-10 MG/5ML syrup Take 5 mLs by mouth every 12 (twelve) hours as needed for cough. 06/11/23  Yes Parrett, Tammy S, NP  hydrochlorothiazide  (HYDRODIURIL ) 12.5 MG tablet Take 1 tablet (12.5 mg total) by mouth daily. 04/21/23  Yes [name], MD  ibuprofen (ADVIL) 800 MG tablet Take 800 mg by mouth 2 (two) times daily as needed (for pain). 04/06/23  Yes [provider]  montelukast  (SINGULAIR ) 10 MG tablet Take 1 tablet (10 mg total) by mouth at bedtime. 06/02/23  Yes Cheryl Reusing, FNP  potassium chloride  SA (KLOR-CON  M) 20 MEQ tablet Take 1 tablet (20 mEq total) by mouth daily. 04/22/23  Yes [name], MD  PRESCRIPTION MEDICATION See admin instructions. CPAP- At bedtime   Yes [provider]  tamoxifen  (NOLVADEX ) 20 MG tablet Take 20 mg by mouth daily.   Yes [provider]  Vitamin D , Ergocalciferol , (DRISDOL ) 1.25 MG (50000 UNIT) CAPS capsule Take 1 capsule (50,000 Units total) by mouth every 7 (seven) days. Patient taking differently: Take 50,000 Units by mouth every Saturday. 04/22/23  Yes [name], MD  ALVESCO  160 MCG/ACT inhaler Inhale 1 puff into the lungs 2 (two) times daily. Patient not taking: Reported on 06/14/2023 06/11/23   Parrett, Madelin RAMAN, NP  cefdinir   (OMNICEF ) 300 MG capsule Take 1 capsule (300 mg total) by mouth 2 (two) times daily. Patient not taking: Reported on 06/14/2023 06/09/23   Horton, Roxie HERO, DO  lisinopril  (ZESTRIL ) 40 MG tablet Take 1 tablet (40 mg total) by mouth daily. Patient not taking: Reported on 06/14/2023 04/21/23   [name], MD  Olopatadine -Mometasone  (RYALTRIS ) [phone] MCG/ACT SUSP Place 2 sprays in each nostril twice a day as needed for stuffy nose/runny nose Patient not taking: Reported on 06/14/2023 06/02/23   Cheryl Reusing, FNP  predniSONE  (DELTASONE ) 10 MG tablet 4 tabs for 2 days, then 3 tabs for 2 days, 2 tabs for 2 days, then 1 tab for 2 days, then stop Patient not taking: Reported on 06/14/2023 06/04/23   Parrett, Madelin RAMAN, NP     CCT n/a   Ronnald Gave MSN, AGACNP-BC [place] [facility]/Critical [facility] Medicine Amion for pager  06/15/2023, 2:09 PM

## 2023-06-16 DIAGNOSIS — J4541 Moderate persistent asthma with (acute) exacerbation: Secondary | ICD-10-CM | POA: Diagnosis not present

## 2023-06-16 LAB — CBC
HCT: 36.6 % (ref 36.0–46.0)
Hemoglobin: 12.1 g/dL (ref 12.0–15.0)
MCH: 31.8 pg (ref 26.0–34.0)
MCHC: 33.1 g/dL (ref 30.0–36.0)
MCV: 96.3 fL (ref 80.0–100.0)
Platelets: 363 10*3/uL (ref 150–400)
RBC: 3.8 MIL/uL — ABNORMAL LOW (ref 3.87–5.11)
RDW: 14 % (ref 11.5–15.5)
WBC: 20.6 10*3/uL — ABNORMAL HIGH (ref 4.0–10.5)
nRBC: 0 % (ref 0.0–0.2)

## 2023-06-16 LAB — RENAL FUNCTION PANEL
Albumin: 3.8 g/dL (ref 3.5–5.0)
Anion gap: 7 (ref 5–15)
BUN: 29 mg/dL — ABNORMAL HIGH (ref 8–23)
CO2: 21 mmol/L — ABNORMAL LOW (ref 22–32)
Calcium: 9.1 mg/dL (ref 8.9–10.3)
Chloride: 111 mmol/L (ref 98–111)
Creatinine, Ser: 1.05 mg/dL — ABNORMAL HIGH (ref 0.44–1.00)
GFR, Estimated: 58 mL/min — ABNORMAL LOW (ref >60)
Glucose, Bld: 85 mg/dL (ref 70–99)
Phosphorus: 3.1 mg/dL (ref 2.5–4.6)
Potassium: 4.2 mmol/L (ref 3.5–5.1)
Sodium: 139 mmol/L (ref 135–145)

## 2023-06-16 LAB — EXPECTORATED SPUTUM ASSESSMENT W GRAM STAIN, RFLX TO RESP C

## 2023-06-16 LAB — MAGNESIUM: Magnesium: 3 mg/dL — ABNORMAL HIGH (ref 1.7–2.4)

## 2023-06-16 NOTE — Progress Notes (Signed)
   06/15/23 2140  BiPAP/CPAP/SIPAP  BiPAP/CPAP/SIPAP Pt Type Adult  BiPAP/CPAP/SIPAP DREAMSTATIOND  Mask Type Full face mask  Mask Size Medium  Respiratory Rate 16 breaths/min  FiO2 (%) 21 %  Patient Home Equipment No  Auto Titrate Yes (<4/>12)  CPAP/SIPAP surface wiped down Yes   Pt. able to place themselves on/off CPAP Independently, remains on room air, aware to notify if help needed.

## 2023-06-16 NOTE — Progress Notes (Signed)
   06/16/23 2312  BiPAP/CPAP/SIPAP  BiPAP/CPAP/SIPAP Pt Type Adult  BiPAP/CPAP/SIPAP DREAMSTATIOND  Mask Type Full face mask  Mask Size Medium  Respiratory Rate 18 breaths/min  FiO2 (%) 21 %  Patient Home Equipment No  Auto Titrate Yes (6-16)

## 2023-06-16 NOTE — Progress Notes (Signed)
 RT Note: Nebulizer medications pulled from [facility] twice due to patient vomited into nebulizer cup.

## 2023-06-16 NOTE — Progress Notes (Signed)
 PCCM Brief Progress Note  Patient reports some improvement in cough today. No changes to current regimen. Pulmonary will see tomorrow  Slater Staff, M.D. [facility] Pulmonary/Critical Care Medicine 06/16/2023 12:46 PM   See Amion for personal pager For hours between 7 PM to 7 AM, please call Elink for urgent questions

## 2023-06-16 NOTE — Progress Notes (Signed)
 PROGRESS NOTE  Rebecca [name] FMW:[id number] DOB: 06-29-1955   PCP: [name], MD  Patient is from: Home  DOA: 06/14/2023 LOS: 1  Chief complaints Chief Complaint  Patient presents with   Shortness of Breath     Brief Narrative / Interim history: 68 year old F with PMH of asthma, breast cancer, HTN, HLD, obesity, OSA on CPAP.  And possible ILD presenting with increased shortness of breath and cough for about 3 weeks that has not improved after outpatient steroid taper, Zithromax  and breathing treatments, and admitted with acute asthma exacerbation.  Recent CT angio chest on 12/18 raise concern for early ILD.  In ED, vital stable.  No oxygen requirement but respiratory distress from severe cough and wheezing.  COVID-19, influenza and RSV PCR nonreactive.  CXR without acute finding.  Started on systemic steroid and breathing treatments.  The next day, nebulizers suggested.  PCCM consulted.   Subjective: Seen and examined earlier this morning.  No major events overnight of this morning.  Reports some improvement in her cough.  She said she had a good night until 4 AM when she woke up with significant cough.  She coughed too much until she throws up.  Objective: Vitals:   06/16/23 0544 06/16/23 0753 06/16/23 0836 06/16/23 1209  BP: 129/81  129/81 130/78  Pulse: 74   88  Resp: 20   19  Temp: 98.5 F (36.9 C)   99.2 F (37.3 C)  TempSrc: Oral   Oral  SpO2: 100% 98%  97%  Weight:      Height:        Examination:  GENERAL: No apparent distress.  Nontoxic. HEENT: MMM.  Vision and hearing grossly intact.  NECK: Supple.  No apparent JVD.  RESP:  No IWOB.  Fair aeration bilaterally.  Symptom of upper respiratory symptoms.  Frequent cough during exam CVS:  RRR. Heart sounds normal.  ABD/GI/GU: BS+. Abd soft, NTND.  MSK/EXT:  Moves extremities. No apparent deformity. No edema.  SKIN: no apparent skin lesion or wound NEURO: Awake, alert and oriented appropriately.  No  apparent focal neuro deficit. PSYCH: Calm. Normal affect.   Procedures:  None  Microbiology summarized: COVID-19, influenza and RSV PCR nonreactive Full RVP nonreactive  Assessment and plan: Acute asthma exacerbation: Still with persistent cough and rhonchi.  Ongoing respiratory distress with cough and shortness of breath for 3 weeks despite outpatient steroid taper and breathing treatments.  Also completed empiric Zithromax  outpatient.  She is followed by pulmonology.  Outpatient CT on 12/18 raises concern for early ILD.  She is on ACE inhibitor's but has not taken this in 5 days.  Denies heartburn or reflux.  Has history of allergies.  BNP within normal.  Denies smoking -Appreciate help by pulmonology -Continue Solu-Medrol  to 80 mg daily, Pulmicort , Brovana  and Yupelri  -Continue Protonix , mucolytic's and antitussive  -Avoid ACE inhibitors. -Needs to optimize inhalers on discharge. -Follow sputum culture.  Gram stain with few GNR and GPC. -Patient has scheduled follow-up with Dr. Geronimo with PFTs as well  AKI: Creatinine slightly up after interval improvement. Recent Labs    12/22/22 0926 12/29/22 0332 12/29/22 1406 04/21/23 1143 06/02/23 1924 06/09/23 1255 06/14/23 1412 06/15/23 0334 06/16/23 0428  BUN 18 14 17 16 16  39* 29* 28* 29*  CREATININE 0.77 0.73 0.91 0.90 0.96 1.30* 1.06* 0.76 1.05*  -Hold home HCTZ    Essential hypertension: Normotensive. -Hold HCTZ -Add amlodipine .  Discontinue lisinopril  indefinitely   Hypokalemia: Resolved.   Leukocytosis-likely due to recent  steroids   OSA on CPAP -Continue CPAP  History of breast cancer -Continue home med  Class II obesity Body mass index is 36.1 kg/m. -Encourage lifestyle change to lose weight         DVT prophylaxis:  enoxaparin  (LOVENOX ) injection 40 mg Start: 06/14/23 1800 SCDs Start: 06/14/23 1709  Code Status: Full code Family Communication: None at bedside Level of care: Med-Surg Status is:  Inpatient The patient will remain inpatient because: Asthma exacerbation with significant respiratory distress   Final disposition: Home once medically stable Consultants:  Pulmonology  55 minutes with more than 50% spent in reviewing records, counseling patient/family and coordinating care.   Sch Meds:  Scheduled Meds:  amLODipine   5 mg Oral Daily   arformoterol   15 mcg Nebulization BID   budesonide  (PULMICORT ) nebulizer solution  0.5 mg Nebulization BID   enoxaparin  (LOVENOX ) injection  40 mg Subcutaneous Q24H   guaiFENesin   600 mg Oral BID   methylPREDNISolone  (SOLU-MEDROL ) injection  80 mg Intravenous Q24H   montelukast   10 mg Oral QHS   pantoprazole   40 mg Oral Daily   potassium chloride  SA  20 mEq Oral Daily   revefenacin   175 mcg Nebulization Daily   tamoxifen   20 mg Oral Daily   Continuous Infusions: PRN Meds:.acetaminophen  **OR** acetaminophen , albuterol , benzonatate , guaiFENesin -dextromethorphan , hydrOXYzine , ondansetron  **OR** ondansetron  (ZOFRAN ) IV, senna-docusate, traZODone   Antimicrobials: Anti-infectives (From admission, onward)    None        I have personally reviewed the following labs and images: CBC: Recent Labs  Lab 06/14/23 1412 06/15/23 0334 06/16/23 0428  WBC 16.9* 16.4* 20.6*  NEUTROABS 12.4*  --   --   HGB 12.4 11.2* 12.1  HCT 37.6 34.6* 36.6  MCV 94.7 94.8 96.3  PLT 382 372 363   BMP &GFR Recent Labs  Lab 06/14/23 1412 06/15/23 0334 06/16/23 0428  NA 139 135 139  K 3.8 4.3 4.2  CL 110 110 111  CO2 19* 18* 21*  GLUCOSE 132* 192* 85  BUN 29* 28* 29*  CREATININE 1.06* 0.76 1.05*  CALCIUM  9.0 8.5* 9.1  MG  --   --  3.0*  PHOS  --   --  3.1   Estimated Creatinine Clearance: 50.6 mL/min (A) (by C-G formula based on SCr of 1.05 mg/dL (H)). Liver & Pancreas: Recent Labs  Lab 06/16/23 0428  ALBUMIN 3.8   No results for input(s): LIPASE, AMYLASE in the last 168 hours. No results for input(s): AMMONIA in the last 168  hours. Diabetic: No results for input(s): HGBA1C in the last 72 hours. No results for input(s): GLUCAP in the last 168 hours. Cardiac Enzymes: No results for input(s): CKTOTAL, CKMB, CKMBINDEX, TROPONINI in the last 168 hours. No results for input(s): PROBNP in the last 8760 hours. Coagulation Profile: No results for input(s): INR, PROTIME in the last 168 hours. Thyroid  Function Tests: No results for input(s): TSH, T4TOTAL, FREET4, T3FREE, THYROIDAB in the last 72 hours. Lipid Profile: No results for input(s): CHOL, HDL, LDLCALC, TRIG, CHOLHDL, LDLDIRECT in the last 72 hours. Anemia Panel: No results for input(s): VITAMINB12, FOLATE, FERRITIN, TIBC, IRON, RETICCTPCT in the last 72 hours. Urine analysis:    Component Value Date/Time   COLORURINE STRAW (A) 04/19/2021 0200   APPEARANCEUR CLEAR 04/19/2021 0200   LABSPEC 1.006 04/19/2021 0200   PHURINE 7.0 04/19/2021 0200   GLUCOSEU NEGATIVE 04/19/2021 0200   HGBUR NEGATIVE 04/19/2021 0200   BILIRUBINUR NEGATIVE 04/19/2021 0200   BILIRUBINUR neg 04/05/2020 1513  KETONESUR NEGATIVE 04/19/2021 0200   PROTEINUR NEGATIVE 04/19/2021 0200   UROBILINOGEN 0.2 04/05/2020 1513   NITRITE NEGATIVE 04/19/2021 0200   LEUKOCYTESUR MODERATE (A) 04/19/2021 0200   Sepsis Labs: Invalid input(s): PROCALCITONIN, LACTICIDVEN  Microbiology: Recent Results (from the past 240 hours)  Resp panel by RT-PCR (RSV, Flu A&B, Covid) Anterior Nasal Swab     Status: None   Collection Time: 06/14/23  1:12 PM   Specimen: Anterior Nasal Swab  Result Value Ref Range Status   SARS Coronavirus 2 by RT PCR NEGATIVE NEGATIVE Final    Comment: (NOTE) SARS-CoV-2 target nucleic acids are NOT DETECTED.  The SARS-CoV-2 RNA is generally detectable in upper respiratory specimens during the acute phase of infection. The lowest concentration of SARS-CoV-2 viral copies this assay can detect is 138 copies/mL. A negative  result does not preclude SARS-Cov-2 infection and should not be used as the sole basis for treatment or other patient management decisions. A negative result may occur with  improper specimen collection/handling, submission of specimen other than nasopharyngeal swab, presence of viral mutation(s) within the areas targeted by this assay, and inadequate number of viral copies(<138 copies/mL). A negative result must be combined with clinical observations, patient history, and epidemiological information. The expected result is Negative.  Fact Sheet for Patients:  bloggercourse.com  Fact Sheet for Healthcare Providers:  seriousbroker.it  This test is no t yet approved or cleared by the [place]  FDA and  has been authorized for detection and/or diagnosis of SARS-CoV-2 by FDA under an Emergency Use Authorization (EUA). This EUA will remain  in effect (meaning this test can be used) for the duration of the COVID-19 declaration under Section 564(b)(1) of the Act, 21 U.S.C.section 360bbb-3(b)(1), unless the authorization is terminated  or revoked sooner.       Influenza A by PCR NEGATIVE NEGATIVE Final   Influenza B by PCR NEGATIVE NEGATIVE Final    Comment: (NOTE) The Xpert Xpress SARS-CoV-2/FLU/RSV plus assay is intended as an aid in the diagnosis of influenza from Nasopharyngeal swab specimens and should not be used as a sole basis for treatment. Nasal washings and aspirates are unacceptable for Xpert Xpress SARS-CoV-2/FLU/RSV testing.  Fact Sheet for Patients: bloggercourse.com  Fact Sheet for Healthcare Providers: seriousbroker.it  This test is not yet approved or cleared by the [place]  FDA and has been authorized for detection and/or diagnosis of SARS-CoV-2 by FDA under an Emergency Use Authorization (EUA). This EUA will remain in effect (meaning this test can be used)  for the duration of the COVID-19 declaration under Section 564(b)(1) of the Act, 21 U.S.C. section 360bbb-3(b)(1), unless the authorization is terminated or revoked.     Resp Syncytial Virus by PCR NEGATIVE NEGATIVE Final    Comment: (NOTE) Fact Sheet for Patients: bloggercourse.com  Fact Sheet for Healthcare Providers: seriousbroker.it  This test is not yet approved or cleared by the [place]  FDA and has been authorized for detection and/or diagnosis of SARS-CoV-2 by FDA under an Emergency Use Authorization (EUA). This EUA will remain in effect (meaning this test can be used) for the duration of the COVID-19 declaration under Section 564(b)(1) of the Act, 21 U.S.C. section 360bbb-3(b)(1), unless the authorization is terminated or revoked.  Performed at [facility], [address], [place], [place] [address]   Respiratory (~20 pathogens) panel by PCR     Status: None   Collection Time: 06/14/23  7:14 PM   Specimen: Nasopharyngeal Swab; Respiratory  Result Value Ref Range Status  Adenovirus NOT DETECTED NOT DETECTED Final   Coronavirus 229E NOT DETECTED NOT DETECTED Final    Comment: (NOTE) The Coronavirus on the Respiratory Panel, DOES NOT test for the novel  Coronavirus (2019 nCoV)    Coronavirus HKU1 NOT DETECTED NOT DETECTED Final   Coronavirus NL63 NOT DETECTED NOT DETECTED Final   Coronavirus OC43 NOT DETECTED NOT DETECTED Final   Metapneumovirus NOT DETECTED NOT DETECTED Final   Rhinovirus / Enterovirus NOT DETECTED NOT DETECTED Final   Influenza A NOT DETECTED NOT DETECTED Final   Influenza B NOT DETECTED NOT DETECTED Final   Parainfluenza Virus 1 NOT DETECTED NOT DETECTED Final   Parainfluenza Virus 2 NOT DETECTED NOT DETECTED Final   Parainfluenza Virus 3 NOT DETECTED NOT DETECTED Final   Parainfluenza Virus 4 NOT DETECTED NOT DETECTED Final   Respiratory Syncytial Virus NOT DETECTED NOT  DETECTED Final   Bordetella pertussis NOT DETECTED NOT DETECTED Final   Bordetella Parapertussis NOT DETECTED NOT DETECTED Final   Chlamydophila pneumoniae NOT DETECTED NOT DETECTED Final   Mycoplasma pneumoniae NOT DETECTED NOT DETECTED Final    Comment: Performed at [facility], [address], [place], [place] [address]  Expectorated Sputum Assessment w Gram Stain, Rflx to Resp Cult     Status: None   Collection Time: 06/15/23 10:45 PM   Specimen: Expectorated Sputum  Result Value Ref Range Status   Specimen Description EXPECTORATED SPUTUM  Final   Special Requests NONE  Final   Sputum evaluation   Final    THIS SPECIMEN IS ACCEPTABLE FOR SPUTUM CULTURE Performed at [facility], [address], [place], [place] [address]    Report Status 06/15/2023 FINAL  Final  Culture, Respiratory w Gram Stain     Status: None (Preliminary result)   Collection Time: 06/15/23 10:45 PM  Result Value Ref Range Status   Specimen Description   Final    EXPECTORATED SPUTUM Performed at [facility], [address] [place], [place] [address]    Special Requests   Final    NONE Reflexed from [id number] Performed at [facility], [address] [place], [place] [address]    Gram Stain   Final    RARE WBC PRESENT, PREDOMINANTLY PMN FEW GRAM POSITIVE COCCI FEW GRAM NEGATIVE RODS Performed at [facility], [address], [place], [place] [address]    Culture PENDING  Incomplete   Report Status PENDING  Incomplete    Radiology Studies: No results found.     Vonn Sliger T. Kawena Lyday [facility]  If 7PM-7AM, please contact night-coverage [email or web address] 06/16/2023, 1:10 PM

## 2023-06-16 NOTE — Plan of Care (Signed)

## 2023-06-17 ENCOUNTER — Inpatient Hospital Stay (HOSPITAL_COMMUNITY): Payer: 59

## 2023-06-17 DIAGNOSIS — R052 Subacute cough: Secondary | ICD-10-CM | POA: Diagnosis not present

## 2023-06-17 DIAGNOSIS — J4541 Moderate persistent asthma with (acute) exacerbation: Secondary | ICD-10-CM | POA: Diagnosis not present

## 2023-06-17 MED ORDER — PANTOPRAZOLE SODIUM 40 MG PO TBEC
40.0000 mg | DELAYED_RELEASE_TABLET | Freq: Two times a day (BID) | ORAL | Status: DC
  Administered 2023-06-17 – 2023-06-23 (×13): 40 mg via ORAL
  Filled 2023-06-17 (×13): qty 1

## 2023-06-17 MED ORDER — AMLODIPINE BESYLATE 10 MG PO TABS
10.0000 mg | ORAL_TABLET | Freq: Every day | ORAL | Status: DC
  Administered 2023-06-17 – 2023-06-23 (×7): 10 mg via ORAL
  Filled 2023-06-17 (×7): qty 1

## 2023-06-17 MED ORDER — IOHEXOL 300 MG/ML  SOLN
75.0000 mL | Freq: Once | INTRAMUSCULAR | Status: AC | PRN
  Administered 2023-06-17: 75 mL via INTRAVENOUS

## 2023-06-17 NOTE — Progress Notes (Signed)
 PROGRESS NOTE  Rebecca [name] FMW:[id number] DOB: [birth year]   PCP: [name], MD  Patient is from: Home  DOA: 06/14/2023 LOS: 2  Chief complaints Chief Complaint  Patient presents with   Shortness of Breath     Brief Narrative / Interim history: 68 year old F with PMH of asthma, breast cancer, HTN, HLD, obesity, OSA on CPAP.  And possible ILD presenting with increased shortness of breath and cough for about 3 weeks that has not improved after outpatient steroid taper, Zithromax  and breathing treatments, and admitted with acute asthma exacerbation.  Recent CT angio chest on 12/18 raise concern for early ILD.  In ED, vital stable.  No oxygen requirement but respiratory distress from severe cough and wheezing.  COVID-19, influenza and RSV PCR nonreactive.  CXR without acute finding.  Started on systemic steroid and breathing treatments.  Slowly improving but with intermittent cough fit and associated dyspnea.  PCCM consulted and following..   Subjective: Seen and examined earlier this morning.  Patient has a good night until earlier this morning.  She was drinking her coffee when she suddenly went into coughing fit with significant respiratory distress.  She had another episode with deep inhalation during my exam.  Objective: Vitals:   06/17/23 0730 06/17/23 0900 06/17/23 1055 06/17/23 1256  BP: 138/80   (!) 149/71  Pulse: 88   98  Resp: 18   20  Temp:    97.8 F (36.6 C)  TempSrc:    Oral  SpO2: 100% 99% 98% 94%  Weight:      Height:        Examination:  GENERAL: No apparent distress.  Nontoxic. HEENT: MMM.  Vision and hearing grossly intact.  NECK: Supple.  No apparent JVD.  RESP: Went into a coughing fit with significant dyspnea during exam but able to settle quickly.  No desaturation.. CVS:  RRR. Heart sounds normal.  ABD/GI/GU: BS+. Abd soft, NTND.  MSK/EXT:  Moves extremities. No apparent deformity. No edema.  SKIN: no apparent skin lesion or  wound NEURO: Awake, alert and oriented appropriately.  No apparent focal neuro deficit. PSYCH: Calm. Normal affect.   Procedures:  None  Microbiology summarized: COVID-19, influenza and RSV PCR nonreactive Full RVP nonreactive  Assessment and plan: Acute asthma exacerbation: Ongoing respiratory distress with cough and shortness of breath for 3 weeks despite outpatient steroid taper and breathing treatments.  Also completed empiric Zithromax  outpatient.  She is followed by pulmonology.  Outpatient CT on 12/18 raises concern for early ILD.  She is on ACE inhibitor's but has not taken it for 5 days.  Denies heartburn or reflux.  Has history of allergies.  BNP within normal.  Denies smoking.  CT soft tissue neck without significant finding.  Continues to have coughing fit with associated shortness of breath.  -Appreciate help by pulmonology -Continue Solu-Medrol  to 80 mg daily, Pulmicort , Brovana  and Yupelri  -Continue DuoNeb, mucolytic's and antitussive . -Increase Protonix  to twice daily -Avoid ACE inhibitors. -Needs to optimize inhalers on discharge. -Follow sputum culture.  Gram stain with few GNR and GPC. -Patient has scheduled follow-up with Dr. Geronimo with PFTs as well -SLP eval  AKI: Cr up after interval improvement. Recent Labs    12/22/22 0926 12/29/22 0332 12/29/22 1406 04/21/23 1143 06/02/23 1924 06/09/23 1255 06/14/23 1412 06/15/23 0334 06/16/23 0428  BUN 18 14 17 16 16  39* 29* 28* 29*  CREATININE 0.77 0.73 0.91 0.90 0.96 1.30* 1.06* 0.76 1.05*  -Continue holding HCTZ -Monitor  Essential hypertension:  BP slightly elevated. -Hold HCTZ -Increase amlodipine  to 10 mg daily   Hypokalemia: Resolved.   Leukocytosis-likely due to recent steroids   OSA on CPAP -Continue CPAP  History of breast cancer -Continue home med  Class II obesity Body mass index is 36.1 kg/m. -Encourage lifestyle change to lose weight         DVT prophylaxis:  enoxaparin   (LOVENOX ) injection 40 mg Start: 06/14/23 1800 SCDs Start: 06/14/23 1709  Code Status: Full code Family Communication: None at bedside Level of care: Med-Surg Status is: Inpatient The patient will remain inpatient because: Asthma exacerbation with significant respiratory distress   Final disposition: Home once medically stable Consultants:  Pulmonology  55 minutes with more than 50% spent in reviewing records, counseling patient/family and coordinating care.   Sch Meds:  Scheduled Meds:  amLODipine   10 mg Oral Daily   arformoterol   15 mcg Nebulization BID   budesonide  (PULMICORT ) nebulizer solution  0.5 mg Nebulization BID   enoxaparin  (LOVENOX ) injection  40 mg Subcutaneous Q24H   guaiFENesin   600 mg Oral BID   methylPREDNISolone  (SOLU-MEDROL ) injection  80 mg Intravenous Q24H   montelukast   10 mg Oral QHS   pantoprazole   40 mg Oral BID   potassium chloride  SA  20 mEq Oral Daily   revefenacin   175 mcg Nebulization Daily   tamoxifen   20 mg Oral Daily   Continuous Infusions: PRN Meds:.acetaminophen  **OR** acetaminophen , albuterol , benzonatate , guaiFENesin -dextromethorphan , hydrOXYzine , ondansetron  **OR** ondansetron  (ZOFRAN ) IV, senna-docusate, traZODone   Antimicrobials: Anti-infectives (From admission, onward)    None        I have personally reviewed the following labs and images: CBC: Recent Labs  Lab 06/14/23 1412 06/15/23 0334 06/16/23 0428  WBC 16.9* 16.4* 20.6*  NEUTROABS 12.4*  --   --   HGB 12.4 11.2* 12.1  HCT 37.6 34.6* 36.6  MCV 94.7 94.8 96.3  PLT 382 372 363   BMP &GFR Recent Labs  Lab 06/14/23 1412 06/15/23 0334 06/16/23 0428  NA 139 135 139  K 3.8 4.3 4.2  CL 110 110 111  CO2 19* 18* 21*  GLUCOSE 132* 192* 85  BUN 29* 28* 29*  CREATININE 1.06* 0.76 1.05*  CALCIUM  9.0 8.5* 9.1  MG  --   --  3.0*  PHOS  --   --  3.1   Estimated Creatinine Clearance: 50.6 mL/min (A) (by C-G formula based on SCr of 1.05 mg/dL (H)). Liver &  Pancreas: Recent Labs  Lab 06/16/23 0428  ALBUMIN 3.8   No results for input(s): LIPASE, AMYLASE in the last 168 hours. No results for input(s): AMMONIA in the last 168 hours. Diabetic: No results for input(s): HGBA1C in the last 72 hours. No results for input(s): GLUCAP in the last 168 hours. Cardiac Enzymes: No results for input(s): CKTOTAL, CKMB, CKMBINDEX, TROPONINI in the last 168 hours. No results for input(s): PROBNP in the last 8760 hours. Coagulation Profile: No results for input(s): INR, PROTIME in the last 168 hours. Thyroid  Function Tests: No results for input(s): TSH, T4TOTAL, FREET4, T3FREE, THYROIDAB in the last 72 hours. Lipid Profile: No results for input(s): CHOL, HDL, LDLCALC, TRIG, CHOLHDL, LDLDIRECT in the last 72 hours. Anemia Panel: No results for input(s): VITAMINB12, FOLATE, FERRITIN, TIBC, IRON, RETICCTPCT in the last 72 hours. Urine analysis:    Component Value Date/Time   COLORURINE STRAW (A) 04/19/2021 0200   APPEARANCEUR CLEAR 04/19/2021 0200   LABSPEC 1.006 04/19/2021 0200   PHURINE 7.0 04/19/2021 0200   GLUCOSEU NEGATIVE 04/19/2021  0200   HGBUR NEGATIVE 04/19/2021 0200   BILIRUBINUR NEGATIVE 04/19/2021 0200   BILIRUBINUR neg 04/05/2020 1513   KETONESUR NEGATIVE 04/19/2021 0200   PROTEINUR NEGATIVE 04/19/2021 0200   UROBILINOGEN 0.2 04/05/2020 1513   NITRITE NEGATIVE 04/19/2021 0200   LEUKOCYTESUR MODERATE (A) 04/19/2021 0200   Sepsis Labs: Invalid input(s): PROCALCITONIN, LACTICIDVEN  Microbiology: Recent Results (from the past 240 hours)  Resp panel by RT-PCR (RSV, Flu A&B, Covid) Anterior Nasal Swab     Status: None   Collection Time: 06/14/23  1:12 PM   Specimen: Anterior Nasal Swab  Result Value Ref Range Status   SARS Coronavirus 2 by RT PCR NEGATIVE NEGATIVE Final    Comment: (NOTE) SARS-CoV-2 target nucleic acids are NOT DETECTED.  The SARS-CoV-2 RNA is generally  detectable in upper respiratory specimens during the acute phase of infection. The lowest concentration of SARS-CoV-2 viral copies this assay can detect is 138 copies/mL. A negative result does not preclude SARS-Cov-2 infection and should not be used as the sole basis for treatment or other patient management decisions. A negative result may occur with  improper specimen collection/handling, submission of specimen other than nasopharyngeal swab, presence of viral mutation(s) within the areas targeted by this assay, and inadequate number of viral copies(<138 copies/mL). A negative result must be combined with clinical observations, patient history, and epidemiological information. The expected result is Negative.  Fact Sheet for Patients:  bloggercourse.com  Fact Sheet for Healthcare Providers:  seriousbroker.it  This test is no t yet approved or cleared by the [place]  FDA and  has been authorized for detection and/or diagnosis of SARS-CoV-2 by FDA under an Emergency Use Authorization (EUA). This EUA will remain  in effect (meaning this test can be used) for the duration of the COVID-19 declaration under Section 564(b)(1) of the Act, 21 U.S.C.section 360bbb-3(b)(1), unless the authorization is terminated  or revoked sooner.       Influenza A by PCR NEGATIVE NEGATIVE Final   Influenza B by PCR NEGATIVE NEGATIVE Final    Comment: (NOTE) The Xpert Xpress SARS-CoV-2/FLU/RSV plus assay is intended as an aid in the diagnosis of influenza from Nasopharyngeal swab specimens and should not be used as a sole basis for treatment. Nasal washings and aspirates are unacceptable for Xpert Xpress SARS-CoV-2/FLU/RSV testing.  Fact Sheet for Patients: bloggercourse.com  Fact Sheet for Healthcare Providers: seriousbroker.it  This test is not yet approved or cleared by the [place]  FDA  and has been authorized for detection and/or diagnosis of SARS-CoV-2 by FDA under an Emergency Use Authorization (EUA). This EUA will remain in effect (meaning this test can be used) for the duration of the COVID-19 declaration under Section 564(b)(1) of the Act, 21 U.S.C. section 360bbb-3(b)(1), unless the authorization is terminated or revoked.     Resp Syncytial Virus by PCR NEGATIVE NEGATIVE Final    Comment: (NOTE) Fact Sheet for Patients: bloggercourse.com  Fact Sheet for Healthcare Providers: seriousbroker.it  This test is not yet approved or cleared by the [place]  FDA and has been authorized for detection and/or diagnosis of SARS-CoV-2 by FDA under an Emergency Use Authorization (EUA). This EUA will remain in effect (meaning this test can be used) for the duration of the COVID-19 declaration under Section 564(b)(1) of the Act, 21 U.S.C. section 360bbb-3(b)(1), unless the authorization is terminated or revoked.  Performed at [facility], [address], [place], [place] [address]   Respiratory (~20 pathogens) panel by PCR     Status:  None   Collection Time: 06/14/23  7:14 PM   Specimen: Nasopharyngeal Swab; Respiratory  Result Value Ref Range Status   Adenovirus NOT DETECTED NOT DETECTED Final   Coronavirus 229E NOT DETECTED NOT DETECTED Final    Comment: (NOTE) The Coronavirus on the Respiratory Panel, DOES NOT test for the novel  Coronavirus (2019 nCoV)    Coronavirus HKU1 NOT DETECTED NOT DETECTED Final   Coronavirus NL63 NOT DETECTED NOT DETECTED Final   Coronavirus OC43 NOT DETECTED NOT DETECTED Final   Metapneumovirus NOT DETECTED NOT DETECTED Final   Rhinovirus / Enterovirus NOT DETECTED NOT DETECTED Final   Influenza A NOT DETECTED NOT DETECTED Final   Influenza B NOT DETECTED NOT DETECTED Final   Parainfluenza Virus 1 NOT DETECTED NOT DETECTED Final   Parainfluenza Virus 2 NOT  DETECTED NOT DETECTED Final   Parainfluenza Virus 3 NOT DETECTED NOT DETECTED Final   Parainfluenza Virus 4 NOT DETECTED NOT DETECTED Final   Respiratory Syncytial Virus NOT DETECTED NOT DETECTED Final   Bordetella pertussis NOT DETECTED NOT DETECTED Final   Bordetella Parapertussis NOT DETECTED NOT DETECTED Final   Chlamydophila pneumoniae NOT DETECTED NOT DETECTED Final   Mycoplasma pneumoniae NOT DETECTED NOT DETECTED Final    Comment: Performed at [facility], [address] [place], [place] [address]  Expectorated Sputum Assessment w Gram Stain, Rflx to Resp Cult     Status: None   Collection Time: 06/15/23 10:45 PM   Specimen: Expectorated Sputum  Result Value Ref Range Status   Specimen Description EXPECTORATED SPUTUM  Final   Special Requests NONE  Final   Sputum evaluation   Final    THIS SPECIMEN IS ACCEPTABLE FOR SPUTUM CULTURE Performed at [facility], [address], [place], [place] [address]    Report Status 06/15/2023 FINAL  Final  Culture, Respiratory w Gram Stain     Status: None (Preliminary result)   Collection Time: 06/15/23 10:45 PM  Result Value Ref Range Status   Specimen Description   Final    EXPECTORATED SPUTUM Performed at [facility], [address], [place], [place] [address]    Special Requests   Final    NONE Reflexed from [id number] Performed at [facility], [address], [place], [place] [address]    Gram Stain   Final    RARE WBC PRESENT, PREDOMINANTLY PMN FEW GRAM POSITIVE COCCI FEW GRAM NEGATIVE RODS    Culture   Final    CULTURE REINCUBATED FOR BETTER GROWTH Performed at [facility], [address] [place], [place] [address]    Report Status PENDING  Incomplete    Radiology Studies: CT SOFT TISSUE NECK W CONTRAST Result Date: 06/17/2023 CLINICAL DATA:  Soft tissue infection suspected, neck, xray done. EXAM: CT NECK WITH CONTRAST TECHNIQUE: Multidetector CT  imaging of the neck was performed using the standard protocol following the bolus administration of intravenous contrast. RADIATION DOSE REDUCTION: This exam was performed according to the departmental dose-optimization program which includes automated exposure control, adjustment of the mA and/or kV according to patient size and/or use of iterative reconstruction technique. CONTRAST:  75mL OMNIPAQUE  IOHEXOL  300 MG/ML  SOLN COMPARISON:  None Available. FINDINGS: Pharynx and larynx: Normal. No mass or swelling. Salivary glands: No inflammation, mass, or stone. Thyroid : Normal. Lymph nodes: No suspicious cervical lymphadenopathy. Vascular: Atherosclerotic calcifications of the aortic arch. Limited intracranial: Unremarkable. Visualized orbits: Unremarkable. Mastoids and visualized paranasal sinuses: Well aerated. Skeleton: Diffuse idiopathic  skeletal hyperostosis of the cervical spine with prominent anterior osteophytes. Upper chest: No acute findings. Other: No mass or fluid collection in the neck. IMPRESSION: 1. No mass or fluid collection in the neck. 2. Diffuse idiopathic skeletal hyperostosis of the cervical spine with prominent anterior osteophytes. Aortic Atherosclerosis (ICD10-I70.0). Electronically Signed   By: Ryan Chess M.D.   On: 06/17/2023 14:22       Jaire Pinkham T. Sime[facility]  If 7PM-7AM, please contact night-coverage [email or web address] 06/17/2023, 4:11 PM

## 2023-06-17 NOTE — Progress Notes (Signed)
   06/17/23 2304  BiPAP/CPAP/SIPAP  BiPAP/CPAP/SIPAP Pt Type Adult  BiPAP/CPAP/SIPAP DREAMSTATIOND  Mask Type Full face mask  Mask Size Medium  FiO2 (%) 21 %  Patient Home Equipment No  Auto Titrate Yes (6-16)

## 2023-06-17 NOTE — TOC CM/SW Note (Signed)
 Transition of Care [facility]) - Inpatient Brief Assessment   Patient Details  Name: Rebecca [name] MRN: [id number] Date of Birth: [birth year]  Transition of Care [facility]) CM/SW Contact:    Rebecca JONELLE Rex, RN Phone Number: 06/17/2023, 1:36 PM   Clinical Narrative: Met with pt at bedside to introduce role of TOC/NCM and review for dc planning, pt reports she has an established PCP and pharmacy, no current home care services, uses a cane as needed for functional mobility, reports she feels safe returning home with support from her family, confirmed transportation is available at discharge. TOC Brief Assessment completed. No TOC needs identified at this time.     Transition of Care Asessment: Insurance and Status: Insurance coverage has been reviewed Patient has primary care physician: Yes Home environment has been reviewed: resides in private residence with spouse Prior level of function:: Independent w/cane as needed Prior/Current Home Services: No current home services Social Drivers of Health Review: SDOH reviewed no interventions necessary Readmission risk has been reviewed: Yes Transition of care needs: no transition of care needs at this time

## 2023-06-17 NOTE — Progress Notes (Signed)
 Received order for swallow evaluation, per chart review, pt has DISH impacting cervical spine and was coughing while drinking coffee this am.  RN reports pt to transfer to 5th floor and is coughing significantly.  Advised to proceed to MBS in lieu of BSE given potential obstructive nature of dysphagia from DISH and coughing episode - MD agreed to plan. Thank you!   Requested nurses (via secure chat) inform pt of plan to do MBS in am with SLP.  Thanks for this order.   [name], MS [facility] SLP Acute [employer] [phone]

## 2023-06-17 NOTE — Progress Notes (Signed)
 Spoke with PT about Flutter usage- PT not able to perform at this time due to coughing resulting in significant SOB.

## 2023-06-17 NOTE — Progress Notes (Signed)
 Mobility Specialist - Progress Note   06/17/23 1055  Oxygen Therapy  SpO2 98 %  O2 Device Nasal Cannula  O2 Flow Rate (L/min) 2 L/min  Patient Activity (if Appropriate) Ambulating  Mobility  Activity Ambulated independently in hallway  Level of Assistance Independent  Assistive Device None  Distance Ambulated (ft) 210 ft  Activity Response Tolerated well  Mobility Referral Yes  Mobility visit 1 Mobility  Mobility Specialist Start Time (ACUTE ONLY) 1041  Mobility Specialist Stop Time (ACUTE ONLY) 1054  Mobility Specialist Time Calculation (min) (ACUTE ONLY) 13 min   Pt received in bed and agreeable to mobility. No complaints during session. Pt to EOB after session with all needs met.    Pre-mobility: 90 HR, 97% SpO2 (2L [place]) During mobility: 100 HR, 98% SpO2 (2L [place]) Post-mobility: 100 HR, 95% SPO2 (2L [place])  Chief Technology Officer

## 2023-06-17 NOTE — Plan of Care (Signed)
  Problem: Clinical Measurements: Goal: Diagnostic test results will improve Outcome: Progressing Goal: Cardiovascular complication will be avoided Outcome: Progressing   Problem: Nutrition: Goal: Adequate nutrition will be maintained Outcome: Progressing   Problem: Coping: Goal: Level of anxiety will decrease Outcome: Progressing   Problem: Elimination: Goal: Will not experience complications related to urinary retention Outcome: Progressing   Problem: Safety: Goal: Ability to remain free from injury will improve Outcome: Progressing   Problem: Skin Integrity: Goal: Risk for impaired skin integrity will decrease Outcome: Progressing

## 2023-06-17 NOTE — Progress Notes (Signed)
   06/17/23 1135  SLP Visit Information  SLP Received On 06/17/23  Reason Eval/Treat Not Completed Other (comment) (await CT of neck for evaluation due to concerns for potential abscess)   [name], MS [facility] SLP Acute Rehab Services Office [phone]

## 2023-06-17 NOTE — Plan of Care (Signed)

## 2023-06-17 NOTE — Plan of Care (Signed)

## 2023-06-17 NOTE — Progress Notes (Signed)
 NAME:  Rebecca [name], MRN:  [id number], DOB:  08-11-1955, LOS: 2 ADMISSION DATE:  06/14/2023, CONSULTATION DATE: 06/15/2023 REFERRING MD: Dr. Kathrin, CHIEF COMPLAINT: Shortness of breath, subacute cough  History of Present Illness:  68 yo F PMH HLD, HTN, OSA on CPAP, asthma, breast cancer s/p L lumpectomy and radiation (05/2022-06/2022) now on tamoxifen , who presented to [facility] 12/30 with CC cough and SOB. Cough/SOB has been ongoing for about 2.5- 3 weeks and she has seen multiple providers in this time span-- course detailed below.   12/18: She was seen in allergy clinic 06/02/23 with CC asthma flare starting a few days prior to office visit, associated productive cough, and a bit of chest pain.   Steroids were prescribed, a CXR and ddimer were obtained.  Cxr revealed some RUL densities and her ddimer was elevated to 2, so she was referred to ED for further evaluation and imaging. A CTA chest 06/02/23 was obtained in ED which was fortunately negative for PE but had some GGOs which could be suggestive of early interstitial lung disease. She was not having any s/sx indicating need for hospital admission and was dc home.    12/20: She was seen in follow up in pulm clinic 06/04/23 and in context of a recent/ongoing slow-to-resolve asthma flare, difficult to say. Was started on a Z pack and a steroid taper, with plan for return visit in 6-8 weeks+ PFTs.   12/25: She presented to ED 12/25 with cough and SOB + wheezing. Had finished her Z pack, was still taking prednisone , and felt that home nebs/MDI (albuteral, alvesco ) were not helping. CXR without She was rx cough meds and cefdinir , dc home.   12/27: She called pulm office 12/27 with report of ongoing cough. Add'l cough syrup was called in and she was advised to call office to schedule follow up sooner than previously planned.    12/30 (Current) She presented to [facility] ED 12/30 with CC cough, SOB. At this point she had completed a prednisone  taper and had  completed azithro.   Reports completing  cefdinir . She was wheezing in ED. Given IV steroids and duoneb with EMS, placed on cont neb for 1hr in ED. CXR without acute abnormality. She was admitted to [facility] to hospitalist service, Rx solumedrol. Is not requiring O2.    PCCM is consulted 06/15/23 in this setting   Pertinent  Medical History  OSA Asthma  Breast cancer  HTN  Significant Hospital Events: Including procedures, antibiotic start and stop dates in addition to other pertinent events   12/30 wheezing, admit to [facility]. BID steroids  12/31 PCCM consult   Interim History / Subjective:  Still with a cough, shortness of breath Had a spasm of a cough about 6 AM today  Objective   Blood pressure 138/80, pulse 88, temperature 97.7 F (36.5 C), temperature source Oral, resp. rate 18, height 5' 1 (1.549 m), weight 86.7 kg, SpO2 98%.    FiO2 (%):  [21 %] 21 %   Intake/Output Summary (Last 24 hours) at 06/17/2023 1118 Last data filed at 06/16/2023 1300 Gross per 24 hour  Intake 120 ml  Output --  Net 120 ml   Filed Weights   06/14/23 1258  Weight: 86.7 kg    Examination: General: Chronically ill-appearing HENT: Moist oral mucosa Lungs: Rhonchi bilaterally Cardiovascular: S1-S2 appreciated Abdomen: Soft, bowel sounds appreciated Extremities: No clubbing, no edema Neuro: Alert and oriented x 3 GU:   Resolved Hospital Problem list  Assessment & Plan:  Asthma exacerbation Subacute cough -Still with spasms of cough -RVP, COVID, flu negative -Did complete course of antibiotics as outpatient including azithromycin , cefdinir  -Now currently on IV steroids -Continue antitussives  Obstructive sleep apnea -Compliant with CPAP use  Continue pulmonary hygiene Continue PPI Continue to hold lisinopril   Cytosis likely secondary to steroids  She is being optimally treated at the present time just taking time to settle  Will continue to follow  Jennet Epley, MD [place]  PCCM Pager: See Tracey

## 2023-06-18 ENCOUNTER — Inpatient Hospital Stay (HOSPITAL_COMMUNITY): Payer: 59

## 2023-06-18 DIAGNOSIS — J4541 Moderate persistent asthma with (acute) exacerbation: Secondary | ICD-10-CM | POA: Diagnosis not present

## 2023-06-18 DIAGNOSIS — J45901 Unspecified asthma with (acute) exacerbation: Secondary | ICD-10-CM

## 2023-06-18 DIAGNOSIS — R052 Subacute cough: Secondary | ICD-10-CM | POA: Diagnosis not present

## 2023-06-18 DIAGNOSIS — K219 Gastro-esophageal reflux disease without esophagitis: Secondary | ICD-10-CM | POA: Diagnosis present

## 2023-06-18 LAB — RENAL FUNCTION PANEL
Albumin: 3.4 g/dL — ABNORMAL LOW (ref 3.5–5.0)
Anion gap: 9 (ref 5–15)
BUN: 27 mg/dL — ABNORMAL HIGH (ref 8–23)
CO2: 22 mmol/L (ref 22–32)
Calcium: 8.8 mg/dL — ABNORMAL LOW (ref 8.9–10.3)
Chloride: 110 mmol/L (ref 98–111)
Creatinine, Ser: 0.76 mg/dL (ref 0.44–1.00)
GFR, Estimated: >60 mL/min (ref >60)
Glucose, Bld: 77 mg/dL (ref 70–99)
Phosphorus: 3.1 mg/dL (ref 2.5–4.6)
Potassium: 4.1 mmol/L (ref 3.5–5.1)
Sodium: 141 mmol/L (ref 135–145)

## 2023-06-18 LAB — CBC
HCT: 34.8 % — ABNORMAL LOW (ref 36.0–46.0)
Hemoglobin: 11.7 g/dL — ABNORMAL LOW (ref 12.0–15.0)
MCH: 32.1 pg (ref 26.0–34.0)
MCHC: 33.6 g/dL (ref 30.0–36.0)
MCV: 95.3 fL (ref 80.0–100.0)
Platelets: 334 10*3/uL (ref 150–400)
RBC: 3.65 MIL/uL — ABNORMAL LOW (ref 3.87–5.11)
RDW: 14 % (ref 11.5–15.5)
WBC: 18.6 10*3/uL — ABNORMAL HIGH (ref 4.0–10.5)
nRBC: 0 % (ref 0.0–0.2)

## 2023-06-18 LAB — CULTURE, RESPIRATORY W GRAM STAIN: Culture: NORMAL

## 2023-06-18 LAB — MAGNESIUM: Magnesium: 2.6 mg/dL — ABNORMAL HIGH (ref 1.7–2.4)

## 2023-06-18 MED ORDER — BENZONATATE 100 MG PO CAPS: 200.0000 mg | ORAL_CAPSULE | Freq: Three times a day (TID) | ORAL | Status: DC | PRN

## 2023-06-18 MED ORDER — BENZONATATE 100 MG PO CAPS
200.0000 mg | ORAL_CAPSULE | Freq: Three times a day (TID) | ORAL | Status: DC
  Administered 2023-06-18 – 2023-06-23 (×14): 200 mg via ORAL
  Filled 2023-06-18 (×14): qty 2

## 2023-06-18 MED ORDER — GABAPENTIN 100 MG PO CAPS
100.0000 mg | ORAL_CAPSULE | Freq: Three times a day (TID) | ORAL | Status: DC
  Administered 2023-06-18 – 2023-06-20 (×5): 100 mg via ORAL
  Filled 2023-06-18 (×5): qty 1

## 2023-06-18 NOTE — Progress Notes (Signed)
 MBS completed full report to follow.  Patient with functional oral pharyngeal swallow ability without aspiration or penetration of any consistency tested normotension.  Patient was noted during evaluation to start overtly coughing after swallow of thin without barium visualized in larynx or trachea.  Suspect this is potentially a reflux cough given she has history of reflux.  She reports she does not take a PPI prior to admission but currently is on PPI twice daily and SLP advise she follow a reflux prudent diet.  Of note after patient swallowed barium tablet upon esophageal sweep she appeared with barium tablet momentarily halted at distal esophagus with sensation to pharynx along with thin liquid retention.  Delayed dry swallow effectively cleared esophagus.    Patient question why she is coughing that causes her to stop breathing, given history of reflux SLP questions if symptoms are consistent with laryngospasm associated with reflux.    Patient educated to findings of MBS using fluoroscopy loops for details and she reports understanding  [name], MS [facility] SLP Acute Rehab Services Office [phone]

## 2023-06-18 NOTE — Progress Notes (Signed)
   06/18/23 2248  BiPAP/CPAP/SIPAP  BiPAP/CPAP/SIPAP Pt Type Adult  BiPAP/CPAP/SIPAP DREAMSTATIOND  Mask Type Full face mask  Mask Size Medium  FiO2 (%) 21 %  Patient Home Equipment No  Auto Titrate Yes (6-16 cmH2O)

## 2023-06-18 NOTE — Progress Notes (Signed)
 [facility]                                                                              [name], is a 68 y.o. female, DOB - 1955-07-22, FMW:[id number] Admit date - 06/14/2023    Outpatient Primary MD for the patient is Rebecca Clotilda SAUNDERS, MD  LOS - 3  days  Chief Complaint  Patient presents with   Shortness of Breath       Brief summary   Patient is a 68 year old female with asthma, breast cancer, HTN, HLD, obesity, OSA on CPAP.  And possible ILD presenting with increased shortness of breath and cough for about 3 weeks that has not improved after outpatient steroid taper, Zithromax  and breathing treatments, and admitted with acute asthma exacerbation.  Recent CT angio chest on 12/18 raise concern for early ILD.   In ED, vital stable.  No oxygen requirement but respiratory distress from severe cough and wheezing.  COVID-19, influenza and RSV PCR nonreactive.  CXR without acute finding.  Started on systemic steroid and breathing treatments.  Slowly improving but with intermittent cough fit and associated dyspnea.  PCCM consulted   Assessment & Plan    Principal Problem: Acute asthma exacerbation with coughing spells, respiratory distress -Still having spasms of cough with respiratory distress -Patient reports ongoing symptoms for last 3 weeks despite outpatient steroid taper and breathing treatments, antibiotics.  Followed by pulmonology.  Outpatient CT on 12/18 raised concern for early ILD.  On ACE inhibitor but not taken for 5 days. -RSV, flu, COVID-negative, respiratory virus panel negative -Avoid ACE inhibitor -CCM following, adding Neurontin  for the cough 3 times daily with plan to increase gradually if it helps -Continue IV Solu-Medrol , Brovana , scheduled nebs, Yupelri , Tessalon  Perles -Tessalon  Perles.  Allergic to hydrocodone, unable to add Tussionex or Hycodan cough syrup. -Continue Protonix  twice daily  Mild acute kidney injury -Creatinine 1.011/1,  improved to 0.76 -Continue to hold HCTZ    Essential hypertension: -BP stable, continue amlodipine , hold HCTZ   Hypokalemia:  Resolved.   Leukocytosis -likely due to recent steroids   OSA on CPAP -Continue CPAP   History of breast cancer -Continue home med   Class II obesity Estimated body mass index is 36.1 kg/m as calculated from the following:   Height as of this encounter: 5' 1 (1.549 m).   Weight as of this encounter: 86.7 kg.  Code Status:  DVT Prophylaxis:  enoxaparin  (LOVENOX ) injection 40 mg Start: 06/14/23 1800 SCDs Start: 06/14/23 1709   Level of Care: Level of care: Med-Surg Family Communication: Updated patient Disposition Plan:      Remains inpatient appropriate:      Procedures:    Consultants:   Pulmonology  Antimicrobials:   Anti-infectives (From admission, onward)    None          Medications  amLODipine   10 mg Oral Daily   arformoterol   15 mcg Nebulization BID   budesonide  (PULMICORT ) nebulizer solution  0.5 mg Nebulization BID   enoxaparin  (LOVENOX ) injection  40 mg Subcutaneous Q24H   gabapentin   100 mg Oral TID   guaiFENesin   600 mg Oral BID  methylPREDNISolone  (SOLU-MEDROL ) injection  80 mg Intravenous Q24H   montelukast   10 mg Oral QHS   pantoprazole   40 mg Oral BID   potassium chloride  SA  20 mEq Oral Daily   revefenacin   175 mcg Nebulization Daily   tamoxifen   20 mg Oral Daily      Subjective:   Rebecca [name] was seen and examined today.  Still having coughing spells with shortness of breath and respiratory distress.  No fevers, acute chest pain, nausea vomiting, abdominal pain.  Does not feel any better.  Objective:   Vitals:   06/17/23 2054 06/18/23 0518 06/18/23 0733 06/18/23 1436  BP:  (!) 141/75  (!) 122/57  Pulse:  77  92  Resp:  18  20  Temp:  98 F (36.7 C)  98.9 F (37.2 C)  TempSrc:      SpO2: 98% 95% 98% 96%  Weight:      Height:       No intake or output data in the 24 hours ending  06/18/23 1545   Wt Readings from Last 3 Encounters:  06/14/23 86.7 kg  06/09/23 81.6 kg  06/04/23 84.9 kg     Exam General: Alert and oriented x 3, NAD Cardiovascular: S1 S2 auscultated,  RRR Respiratory: No significant  wheezing or rhonchi Gastrointestinal: Soft, nontender, nondistended, + bowel sounds Ext: no pedal edema bilaterally Neuro: no new deficits Psych: anxious    Data Reviewed:  I have personally reviewed following labs    CBC Lab Results  Component Value Date   WBC 18.6 (H) 06/18/2023   RBC 3.65 (L) 06/18/2023   HGB 11.7 (L) 06/18/2023   HCT 34.8 (L) 06/18/2023   MCV 95.3 06/18/2023   MCH 32.1 06/18/2023   PLT 334 06/18/2023   MCHC 33.6 06/18/2023   RDW 14.0 06/18/2023   LYMPHSABS 3.9 06/14/2023   MONOABS 0.4 06/14/2023   EOSABS 0.1 06/14/2023   BASOSABS 0.0 06/14/2023     Last metabolic panel Lab Results  Component Value Date   NA 141 06/18/2023   K 4.1 06/18/2023   CL 110 06/18/2023   CO2 22 06/18/2023   BUN 27 (H) 06/18/2023   CREATININE 0.76 06/18/2023   GLUCOSE 77 06/18/2023   GFRNONAA >60 06/18/2023   GFRAA >90 10/20/2011   CALCIUM  8.8 (L) 06/18/2023   PHOS 3.1 06/18/2023   PROT 7.8 06/09/2023   ALBUMIN 3.4 (L) 06/18/2023   BILITOT 0.4 06/09/2023   ALKPHOS 48 06/09/2023   AST 29 06/09/2023   ALT 33 06/09/2023   ANIONGAP 9 06/18/2023    CBG (last 3)  No results for input(s): GLUCAP in the last 72 hours.    Coagulation Profile: No results for input(s): INR, PROTIME in the last 168 hours.   Radiology Studies: I have personally reviewed the imaging studies  CT SOFT TISSUE NECK W CONTRAST Result Date: 06/17/2023 CLINICAL DATA:  Soft tissue infection suspected, neck, xray done. EXAM: CT NECK WITH CONTRAST TECHNIQUE: Multidetector CT imaging of the neck was performed using the standard protocol following the bolus administration of intravenous contrast. RADIATION DOSE REDUCTION: This exam was performed according to the  departmental dose-optimization program which includes automated exposure control, adjustment of the mA and/or kV according to patient size and/or use of iterative reconstruction technique. CONTRAST:  75mL OMNIPAQUE  IOHEXOL  300 MG/ML  SOLN COMPARISON:  None Available. FINDINGS: Pharynx and larynx: Normal. No mass or swelling. Salivary glands: No inflammation, mass, or stone. Thyroid : Normal. Lymph nodes: No suspicious cervical lymphadenopathy.  Vascular: Atherosclerotic calcifications of the aortic arch. Limited intracranial: Unremarkable. Visualized orbits: Unremarkable. Mastoids and visualized paranasal sinuses: Well aerated. Skeleton: Diffuse idiopathic skeletal hyperostosis of the cervical spine with prominent anterior osteophytes. Upper chest: No acute findings. Other: No mass or fluid collection in the neck. IMPRESSION: 1. No mass or fluid collection in the neck. 2. Diffuse idiopathic skeletal hyperostosis of the cervical spine with prominent anterior osteophytes. Aortic Atherosclerosis (ICD10-I70.0). Electronically Signed   By: Ryan Chess M.D.   On: 06/17/2023 14:22       Eisen Robenson M.D. [facility] 06/18/2023, 3:45 PM  Available via Epic secure chat 7am-7pm After 7 pm, please refer to night coverage provider listed on amion.

## 2023-06-18 NOTE — Progress Notes (Signed)
 Modified Barium Swallow Study  Patient Details  Name: Rebecca [name] MRN: [id number] Date of Birth: 1956-02-28  Today's Date: 06/18/2023  Modified Barium Swallow completed.  Full report located under Chart Review in the Imaging Section.  History of Present Illness Patient is a 68 year old female admitted to [facility] long hospital with asthma exacerbation, episodic coughing fits with severe dyspnea.  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  Concern was present for potential neck abscess but imaging was negative.  CT of neck did show diffuse idiopathic skeletal hyperostosis with prominent anterior osteophytes.  Swallow evaluation ordered.  SLP messaged MD requesting difference of clinical swallow evaluation given patient overtly coughing and choking with liquids and her cervical spine findings.  Obtain approval for MBS study.  Prior upper GI study was normal in 2013 patient has had prior lap band surgery.   Clinical Impression Patient with functional oral pharyngeal swallow ability without aspiration or penetration of any consistency tested. Motility was normal and there was no oropharyngeal retention related to her DISH.  Swallow was strong and timely. Patient was noted during evaluation to start overtly coughing after swallow of thin without barium visualized in larynx or trachea.  Suspect this is potentially a reflux cough given she has history of reflux.  She reports she does not take a PPI prior to admission but currently is on PPI twice daily and SLP advised she follow a reflux prudent diet.  Of note after patient swallowed barium tablet upon esophageal sweep she appeared with barium tablet and thin barium momentarily halting at distal esophagus with sensation to pharynx along with thin liquid retention.  Delayed dry swallow effectively  cleared esophagus.  Patient questioned why coughing causes her to stop breathing, given history of reflux SLP questions if symptoms are consistent with laryngospasm associated with reflux.       Patient educated to findings of MBS using fluoroscopy loops for details and she reports understanding Factors that may increase risk of adverse event in presence of aspiration Noe & Lianne 2021):    Swallow Evaluation Recommendations Recommendations: PO diet PO Diet Recommendation: Regular;Thin liquids (Level 0) Liquid Administration via: Cup;Straw Medication Administration: Whole meds with liquid Supervision: Patient able to self-feed Swallowing strategies  : Slow rate;Small bites/sips;Follow solids with liquids;Multiple dry swallows after each bite/sip Postural changes: Position pt fully upright for meals;Stay upright 30-60 min after meals Oral care recommendations: Oral care BID (2x/day)   [name], MS [facility] SLP Acute Rehab Services Office [phone]    [name] 06/18/2023,3:35 PM

## 2023-06-18 NOTE — Progress Notes (Signed)
 NAME:  Rebecca [name], MRN:  [id number], DOB:  [birth year], LOS: 3 ADMISSION DATE:  06/14/2023, CONSULTATION DATE: 06/15/2023 REFERRING MD: Dr. Kathrin, CHIEF COMPLAINT: Shortness of breath, subacute cough  History of Present Illness:  68 yo F PMH HLD, HTN, OSA on CPAP, asthma, breast cancer s/p L lumpectomy and radiation (05/2022-06/2022) now on tamoxifen , who presented to [facility] 12/30 with CC cough and SOB. Cough/SOB has been ongoing for about 2.5- 3 weeks and she has seen multiple providers in this time span-- course detailed below.   12/18: She was seen in allergy clinic 06/02/23 with CC asthma flare starting a few days prior to office visit, associated productive cough, and a bit of chest pain.   Steroids were prescribed, a CXR and ddimer were obtained.  Cxr revealed some RUL densities and her ddimer was elevated to 2, so she was referred to ED for further evaluation and imaging. A CTA chest 06/02/23 was obtained in ED which was fortunately negative for PE but had some GGOs which could be suggestive of early interstitial lung disease. She was not having any s/sx indicating need for hospital admission and was dc home.    12/20: She was seen in follow up in pulm clinic 06/04/23 and in context of a recent/ongoing slow-to-resolve asthma flare, difficult to say. Was started on a Z pack and a steroid taper, with plan for return visit in 6-8 weeks+ PFTs.   12/25: She presented to ED 12/25 with cough and SOB + wheezing. Had finished her Z pack, was still taking prednisone , and felt that home nebs/MDI (albuteral, alvesco ) were not helping. CXR without She was rx cough meds and cefdinir , dc home.   12/27: She called pulm office 12/27 with report of ongoing cough. Add'l cough syrup was called in and she was advised to call office to schedule follow up sooner than previously planned.    12/30 (Current) She presented to [facility] ED 12/30 with CC cough, SOB. At this point she had completed a prednisone  taper and had  completed azithro.   Reports completing  cefdinir . She was wheezing in ED. Given IV steroids and duoneb with EMS, placed on cont neb for 1hr in ED. CXR without acute abnormality. She was admitted to [facility] to hospitalist service, Rx solumedrol. Is not requiring O2.    PCCM is consulted 06/15/23 in this setting   Pertinent  Medical History  OSA Asthma  Breast cancer  HTN  Significant Hospital Events: Including procedures, antibiotic start and stop dates in addition to other pertinent events   12/30 wheezing, admit to [facility]. BID steroids  12/31 PCCM consult   Interim History / Subjective:  Still with a cough, some shortness of breath Still having spasms of cough Overall does not feel much better  Objective   Blood pressure (!) 141/75, pulse 77, temperature 98 F (36.7 C), resp. rate 18, height 5' 1 (1.549 m), weight 86.7 kg, SpO2 98%.    FiO2 (%):  [21 %] 21 %   Intake/Output Summary (Last 24 hours) at 06/18/2023 1131 Last data filed at 06/17/2023 1400 Gross per 24 hour  Intake 360 ml  Output --  Net 360 ml   Filed Weights   06/14/23 1258  Weight: 86.7 kg    Examination: General: Chronically ill-appearing HENT: Moist oral mucosa Lungs: Clear breath sounds today Cardiovascular: S1-S2 appreciated Abdomen: Soft, bowel sounds appreciated Extremities: No clubbing, no edema Neuro: Alert and oriented x 3 GU:   Resolved Hospital Problem list  Assessment & Plan:   Asthma exacerbation Subacute cough -Still with spasms of cough -RVP, COVID, flu negative -Did complete course of antibiotics as outpatient -Remains on steroids -Continue antitussives  Will add Neurontin  for the cough-Neurontin  100 p.o. 3 times daily with plan to increase dose gradually if it helps.  May also be weaned off to discontinuation once cough completely resolves -Side effect to watch out for will be sleepiness tiredness, fatigue  Obstructive sleep apnea -Compliant with CPAP use  Continue pulmonary  hygiene Continue PPI  Continue to monitor symptoms  Results of MBS noted  Jennet Epley, MD [place] PCCM Pager: See Tracey

## 2023-06-18 NOTE — Plan of Care (Signed)
  Problem: Education: Goal: Knowledge of General Education information will improve Description: Including pain rating scale, medication(s)/side effects and non-pharmacologic comfort measures Outcome: Progressing   Problem: Education: Goal: Knowledge of General Education information will improve Description: Including pain rating scale, medication(s)/side effects and non-pharmacologic comfort measures Outcome: Progressing   Problem: Clinical Measurements: Goal: Diagnostic test results will improve Outcome: Progressing Goal: Respiratory complications will improve Outcome: Progressing

## 2023-06-18 NOTE — Progress Notes (Signed)
 Modified Barium Swallow Study  Patient Details  Name: Rebecca [name] MRN: [id number] Date of Birth: May 19, 1956  Today's Date: 06/18/2023  HPI/PMH: No data recorded  Clinical Impression: Clinical Impression: Patient with functional oral pharyngeal swallow ability without aspiration or penetration of any consistency tested. Motility was normal and there was no oropharyngeal retention related to her DISH.  Swallow was strong and timely. Patient was noted during evaluation to start overtly coughing after swallow of thin without barium visualized in larynx or trachea.  Suspect this is potentially a reflux cough given she has history of reflux.  She reports she does not take a PPI prior to admission but currently is on PPI twice daily and SLP advised she follow a reflux prudent diet.  Of note after patient swallowed barium tablet upon esophageal sweep she appeared with barium tablet and thin barium momentarily halting at distal esophagus with sensation to pharynx along with thin liquid retention.  Delayed dry swallow effectively cleared esophagus.  Patient questioned why coughing causes her to stop breathing, given history of reflux SLP questions if symptoms are consistent with laryngospasm associated with reflux.       Patient educated to findings of MBS using fluoroscopy loops for details and she reports understanding  Factors that may increase risk of adverse event in presence of aspiration Noe & Lianne 2021): No data recorded  Recommendations/Plan: Swallowing Evaluation Recommendations Swallowing Evaluation Recommendations Recommendations: PO diet PO Diet Recommendation: Regular; Thin liquids (Level 0) Liquid Administration via: Cup; Straw Medication Administration: Whole meds with liquid Supervision: Patient able to self-feed Swallowing strategies  : Slow rate; Small bites/sips; Follow solids with liquids; Multiple dry swallows after each bite/sip Postural changes: Position pt fully upright  for meals; Stay upright 30-60 min after meals Oral care recommendations: Oral care BID (2x/day)    Treatment Plan Treatment Plan Treatment recommendations: No treatment recommended at this time     Recommendations Recommendations for follow up therapy are one component of a multi-disciplinary discharge planning process, led by the attending physician.  Recommendations may be updated based on patient status, additional functional criteria and insurance authorization.  Assessment: Orofacial Exam: Orofacial Exam Oral Cavity: Oral Hygiene: WFL Oral Cavity - Dentition: Adequate natural dentition Orofacial Anatomy: WFL Oral Motor/Sensory Function: WFL    Anatomy:  Anatomy: Suspected cervical osteophytes   Boluses Administered: Boluses Administered Boluses Administered: Thin liquids (Level 0); Mildly thick liquids (Level 2, nectar thick); Puree; Solid     Oral Impairment Domain: Oral Impairment Domain Lip Closure: No labial escape Tongue control during bolus hold: Cohesive bolus between tongue to palatal seal Bolus preparation/mastication: Timely and efficient chewing and mashing Bolus transport/lingual motion: Brisk tongue motion Oral residue: Complete oral clearance Location of oral residue : N/A Initiation of pharyngeal swallow : Valleculae; Pyriform sinuses     Pharyngeal Impairment Domain: Pharyngeal Impairment Domain Soft palate elevation: No bolus between soft palate (SP)/pharyngeal wall (PW) Laryngeal elevation: Complete superior movement of thyroid  cartilage with complete approximation of arytenoids to epiglottic petiole Anterior hyoid excursion: Complete anterior movement Epiglottic movement: Complete inversion Laryngeal vestibule closure: Complete, no air/contrast in laryngeal vestibule Pharyngeal stripping wave : Present - complete Pharyngeal contraction (A/P view only): N/A Pharyngoesophageal segment opening: Complete distension and complete duration, no  obstruction of flow Tongue base retraction: No contrast between tongue base and posterior pharyngeal wall (PPW) Pharyngeal residue: Complete pharyngeal clearance     Esophageal Impairment Domain: Esophageal Impairment Domain Esophageal clearance upright position: Esophageal retention with retrograde flow below pharyngoesophageal segment (  PES)    Pill: Pill Consistency administered: Thin liquids (Level 0) Thin liquids (Level 0): [facility]    Penetration/Aspiration Scale Score: Penetration/Aspiration Scale Score 1.  Material does not enter airway: Thin liquids (Level 0); Mildly thick liquids (Level 2, nectar thick); Puree; Solid; Pill    Compensatory Strategies: Compensatory Strategies Compensatory strategies: No       General Information: No data recorded  Diet Prior to this Study: Regular; Thin liquids (Level 0)    Temperature : Normal    Respiratory Status: WFL    Supplemental O2: None (Room air)    History of Recent Intubation: No   Behavior/Cognition: Alert; Cooperative; Pleasant mood  Self-Feeding Abilities: Able to self-feed  Baseline vocal quality/speech: Normal  Volitional Cough: Able to elicit  Volitional Swallow: Able to elicit  Exam Limitations: No limitations   Goal Planning: No data recorded No data recorded No data recorded No data recorded No data recorded  Pain: Pain Assessment Pain Assessment: No/denies pain    End of Session: Start Time:SLP Start Time (ACUTE ONLY): 9161  Stop Time: SLP Stop Time (ACUTE ONLY): 0901  Time Calculation:SLP Time Calculation (min) (ACUTE ONLY): 23 min  Charges: SLP Evaluations $ SLP Speech Visit: 1 Visit  SLP Evaluations $MBS Swallow: 1 Procedure   SLP visit diagnosis: SLP Visit Diagnosis: Dysphagia, unspecified (R13.10)    Past Medical History:  Past Medical History:  Diagnosis Date   Arthritis    Asthma    Blindness, legal    L EYE   Breast cancer (HCC)    GERD (gastroesophageal  reflux disease)    Hearing loss    Hyperlipidemia    Hypertension    Lapband May 2013 11/18/2011   Migraines    Obesity, Class III, BMI 40-49.9 (morbid obesity) (HCC) 06/03/2011   Pre-diabetes    Sleep apnea    uses c-pap   Vertigo    Past Surgical History:  Past Surgical History:  Procedure Laterality Date   ABDOMINAL HYSTERECTOMY  2006   fibroids   BREAST LUMPECTOMY WITH RADIOACTIVE SEED LOCALIZATION Left 04/21/2022   Procedure: LEFT BREAST LUMPECTOMY WITH RADIOACTIVE SEED LOCALIZATION;  Surgeon: Belinda Cough, MD;  Location: [facility];  Service: General;  Laterality: Left;   BREATH TEK H PYLORI  08/17/2011   Procedure: BREATH TEK H PYLORI;  Surgeon: Cough KATHEE Lunger, MD;  Location: [facility];  Service: General;  Laterality: N/A;  PATIENT WILL COME AT 0715   COLONOSCOPY  2008   @ [facility]    EYE SURGERY     Patient unsure of surgery date. Left eye   KNEE SURGERY Right 1992   right knee arthroscopy   LAPAROSCOPIC GASTRIC BANDING  10/20/2011   Procedure: LAPAROSCOPIC GASTRIC BANDING;  Surgeon: Cough KATHEE Lunger, MD;  Location: [facility];  Service: General;  Laterality: N/A;   RE-EXCISION OF BREAST LUMPECTOMY Left 05/05/2022   Procedure: RE-EXCISION OF LEFT BREAST LUMPECTOMY SITE POSTERIOR MARGIN;  Surgeon: Belinda Cough, MD;  Location: [facility];  Service: General;  Laterality: Left;   TOTAL KNEE ARTHROPLASTY Right 08/18/2021   Procedure: TOTAL KNEE ARTHROPLASTY;  Surgeon: Melodi Lerner, MD;  Location: [facility];  Service: Orthopedics;  Laterality: Right;   TOTAL KNEE REVISION Right 12/28/2022   Procedure: Right knee femoral versus total knee arthroplasty revision;  Surgeon: Melodi Lerner, MD;  Location: [facility];  Service: Orthopedics;  Laterality: Right;   [name], MS [facility] SLP Acute Rehab Services Office [phone]  [name] 06/18/2023, 2:29  PM

## 2023-06-18 NOTE — Progress Notes (Signed)
 Speech Language Pathology Treatment: Dysphagia  Patient Details Name: Rebecca [name] MRN: [id number] DOB: 12/28/1955 Today's Date: 06/18/2023 Time: 8544-8487 SLP Time Calculation (min) (ACUTE ONLY): 17 min  Assessment / Plan / Recommendation Clinical Impression  Patient seen to address dysphagia goals.  SLP reviewed MBS with patient including fluoroscopy loops.  Reflux symptom index administered to patient with her scoring 23 of 45 which is indicative of laryngopharyngeal reflux (LPR) if pt scores above 10.  Reviewed esophageal and reflux precautions with patient in writing using teach back.    Patient now endorses issues with having reflux symptoms at home including heartburn and burning in her throat after intake.  She states the symptoms have been occurring at the same time as her respiratory issues.  Advised patient implement reflux precautions and rescore herself monthly with a goal to score below 10.    She endorses history of reflux - but reports she has not taken reflux medicine for approximately 20 years ago.  Currently she is on a PPI twice daily and SLP advise she speak to MD about if this is indicated once she discharges.    Patient continues to report barium tablet lodging in her pharynx therefore SLP reviewed fluoroscopy loops with her showing her that tablet was in distal part of the esophagus with reference sensation to pharynx.    Using teach back all education completed and patient may benefit from GI as an outpatient.  Will sign off at this time thank you for this referral    HPI HPI: Patient is a 68 year old female admitted to [facility] long hospital with asthma exacerbation, episodic coughing fits with severe dyspnea.  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  Concern was present for potential neck abscess but imaging  was negative.  CT of neck did show diffuse idiopathic skeletal hyperostosis with prominent anterior osteophytes.  Swallow evaluation ordered.  SLP messaged MD requesting difference of clinical swallow evaluation given patient overtly coughing and choking with liquids and her cervical spine findings.  Obtain approval for MBS study.  Prior upper GI study was normal in 2013 patient has had prior lap band surgery.      SLP Plan  All goals met      Recommendations for follow up therapy are one component of a multi-disciplinary discharge planning process, led by the attending physician.  Recommendations may be updated based on patient status, additional functional criteria and insurance authorization.    Recommendations  Diet recommendations: Regular;Thin liquid Liquids provided via: Cup;Straw Medication Administration: Whole meds with liquid Supervision: Patient able to self feed Compensations: Slow rate;Small sips/bites Postural Changes and/or Swallow Maneuvers: Seated upright 90 degrees;Upright 30-60 min after meal                  Oral care BID     Dysphagia, unspecified (R13.10)     All goals met    Rebecca POUR, MS [facility] SLP Acute Rehab Services Office [phone]  Rebecca [name]  06/18/2023, 3:57 PM

## 2023-06-19 DIAGNOSIS — I10 Essential (primary) hypertension: Secondary | ICD-10-CM | POA: Diagnosis not present

## 2023-06-19 DIAGNOSIS — J4551 Severe persistent asthma with (acute) exacerbation: Secondary | ICD-10-CM | POA: Diagnosis not present

## 2023-06-19 DIAGNOSIS — K2100 Gastro-esophageal reflux disease with esophagitis, without bleeding: Secondary | ICD-10-CM

## 2023-06-19 DIAGNOSIS — J45901 Unspecified asthma with (acute) exacerbation: Secondary | ICD-10-CM | POA: Diagnosis not present

## 2023-06-19 DIAGNOSIS — G4733 Obstructive sleep apnea (adult) (pediatric): Secondary | ICD-10-CM

## 2023-06-19 DIAGNOSIS — J4541 Moderate persistent asthma with (acute) exacerbation: Secondary | ICD-10-CM | POA: Diagnosis not present

## 2023-06-19 MED ORDER — PREDNISONE 20 MG PO TABS
40.0000 mg | ORAL_TABLET | Freq: Every day | ORAL | Status: AC
  Administered 2023-06-20 – 2023-06-22 (×3): 40 mg via ORAL
  Filled 2023-06-19 (×3): qty 2

## 2023-06-19 MED ORDER — MOMETASONE FURO-FORMOTEROL FUM 200-5 MCG/ACT IN AERO
2.0000 | INHALATION_SPRAY | Freq: Two times a day (BID) | RESPIRATORY_TRACT | Status: DC
  Administered 2023-06-20 – 2023-06-21 (×3): 2 via RESPIRATORY_TRACT
  Filled 2023-06-19: qty 8.8

## 2023-06-19 NOTE — Progress Notes (Signed)
 NAME:  Rebecca [name], MRN:  [id number], DOB:  10/01/1955, LOS: 4 ADMISSION DATE:  06/14/2023, CONSULTATION DATE: 06/15/2023 REFERRING MD: Dr. Kathrin, CHIEF COMPLAINT: Shortness of breath, subacute cough  History of Present Illness:  68 yo F PMH HLD, HTN, OSA on CPAP, asthma, breast cancer s/p L lumpectomy and radiation (05/2022-06/2022) now on tamoxifen , who presented to [facility] 12/30 with CC cough and SOB. Cough/SOB has been ongoing for about 2.5- 3 weeks and she has seen multiple providers in this time span-- course detailed below.   12/18: She was seen in allergy clinic 06/02/23 with CC asthma flare starting a few days prior to office visit, associated productive cough, and a bit of chest pain.   Steroids were prescribed, a CXR and ddimer were obtained.  Cxr revealed some RUL densities and her ddimer was elevated to 2, so she was referred to ED for further evaluation and imaging. A CTA chest 06/02/23 was obtained in ED which was fortunately negative for PE but had some GGOs which could be suggestive of early interstitial lung disease. She was not having any s/sx indicating need for hospital admission and was dc home.    12/20: She was seen in follow up in pulm clinic 06/04/23 and in context of a recent/ongoing slow-to-resolve asthma flare, difficult to say. Was started on a Z pack and a steroid taper, with plan for return visit in 6-8 weeks+ PFTs.   12/25: She presented to ED 12/25 with cough and SOB + wheezing. Had finished her Z pack, was still taking prednisone , and felt that home nebs/MDI (albuteral, alvesco ) were not helping. CXR without She was rx cough meds and cefdinir , dc home.   12/27: She called pulm office 12/27 with report of ongoing cough. Add'l cough syrup was called in and she was advised to call office to schedule follow up sooner than previously planned.    12/30 (Current) She presented to [facility] ED 12/30 with CC cough, SOB. At this point she had completed a prednisone  taper and had  completed azithro.   Reports completing  cefdinir . She was wheezing in ED. Given IV steroids and duoneb with EMS, placed on cont neb for 1hr in ED. CXR without acute abnormality. She was admitted to [facility] to hospitalist service, Rx solumedrol. Is not requiring O2.    PCCM is consulted 06/15/23 in this setting   Pertinent  Medical History  OSA Asthma  Breast cancer  HTN  Significant Hospital Events: Including procedures, antibiotic start and stop dates in addition to other pertinent events   12/30 wheezing, admit to [facility]. BID steroids  12/31 PCCM consult   Interim History / Subjective:  Still with a cough, some shortness of breath Still having spasms of cough Overall does not feel much better  Objective   Blood pressure 139/70, pulse 93, temperature 98.7 F (37.1 C), temperature source Oral, resp. rate 18, height 5' 1 (1.549 m), weight 86.7 kg, SpO2 96%.    FiO2 (%):  [21 %] 21 %   Intake/Output Summary (Last 24 hours) at 06/19/2023 1750 Last data filed at 06/18/2023 2104 Gross per 24 hour  Intake 250 ml  Output --  Net 250 ml   Filed Weights   06/14/23 1258  Weight: 86.7 kg    Physical Exam: General: Well-appearing, no acute distress HENT: [place], AT Eyes: EOMI, no scleral icterus Respiratory: Clear to auscultation bilaterally.  No crackles, wheezing or rales Cardiovascular: RRR, -M/R/G, no JVD Extremities:-Edema,-tenderness Neuro: AAO x4, CNII-XII grossly intact Psych:  Normal mood, normal affect  Resolved Hospital Problem list     Assessment & Plan:   Asthma exacerbation - on room air, resolving Spasmatic cough episodes - may be due to vocal cord dysfunction RVP, COVID, flu negative Did complete course of antibiotics as outpatient Normal inpatient swallow evaluation Plan -Wean steroids. Transitioned to PO prednisone  40 mg. Reduce by 10 mg every 3 days -Transition from nebulizers to Dulera  200-5 mcg. Discharge patient on this inhaler but will need to determine as an  outpatient what bronchodilators are covered for ICS/LABA as an outpatient -Continue antitussives -Neurontin  added 1/3 for the cough-Neurontin  100 p.o. 3 times daily with plan to increase dose gradually if it helps.  May also be weaned off to discontinuation once cough completely resolves. This can be completed as an outpatient. Side effect to watch out for will be sleepiness tiredness, fatigue -Seen by ENT with exam demonstrating vocal cod dysfunction. ENT recommends BID PPI and follow-up as outpatient  Obstructive sleep apnea -Compliant with CPAP use  Care Time: 50 min  Slater Staff, M.D. [facility] Pulmonary/Critical Care Medicine 06/19/2023 5:50 PM   See Amion for personal pager For hours between 7 PM to 7 AM, please call Elink for urgent questions

## 2023-06-19 NOTE — Consult Note (Signed)
 Reason for Consult:cough stridor Referring Physician: Dr Davia Karna LITTIE Rebecca [name] is an 68 y.o. female.  HPI: hx of cough and asthma. She has become worse with noisy breathing when she getting into a coughing spell. She has no significant change in voice. She has not had this previously. It started 12/25.  Past Medical History:  Diagnosis Date   Arthritis    Asthma    Blindness, legal    L EYE   Breast cancer (HCC)    GERD (gastroesophageal reflux disease)    Hearing loss    Hyperlipidemia    Hypertension    Lapband May 2013 11/18/2011   Migraines    Obesity, Class III, BMI 40-49.9 (morbid obesity) (HCC) 06/03/2011   Pre-diabetes    Sleep apnea    uses c-pap   Vertigo     Past Surgical History:  Procedure Laterality Date   ABDOMINAL HYSTERECTOMY  2006   fibroids   BREAST LUMPECTOMY WITH RADIOACTIVE SEED LOCALIZATION Left 04/21/2022   Procedure: LEFT BREAST LUMPECTOMY WITH RADIOACTIVE SEED LOCALIZATION;  Surgeon: Belinda Cough, MD;  Location: [facility];  Service: General;  Laterality: Left;   BREATH TEK H PYLORI  08/17/2011   Procedure: BREATH TEK H PYLORI;  Surgeon: Cough KATHEE Lunger, MD;  Location: [facility];  Service: General;  Laterality: N/A;  PATIENT WILL COME AT 0715   COLONOSCOPY  2008   @ [facility]    EYE SURGERY     Patient unsure of surgery date. Left eye   KNEE SURGERY Right 1992   right knee arthroscopy   LAPAROSCOPIC GASTRIC BANDING  10/20/2011   Procedure: LAPAROSCOPIC GASTRIC BANDING;  Surgeon: Cough KATHEE Lunger, MD;  Location: [facility];  Service: General;  Laterality: N/A;   RE-EXCISION OF BREAST LUMPECTOMY Left 05/05/2022   Procedure: RE-EXCISION OF LEFT BREAST LUMPECTOMY SITE POSTERIOR MARGIN;  Surgeon: Belinda Cough, MD;  Location: [facility];  Service: General;  Laterality: Left;   TOTAL KNEE ARTHROPLASTY Right 08/18/2021   Procedure: TOTAL KNEE ARTHROPLASTY;  Surgeon: Melodi Lerner, MD;  Location: [facility];  Service:  Orthopedics;  Laterality: Right;   TOTAL KNEE REVISION Right 12/28/2022   Procedure: Right knee femoral versus total knee arthroplasty revision;  Surgeon: Melodi Lerner, MD;  Location: [facility];  Service: Orthopedics;  Laterality: Right;    Family History  Problem Relation Age of Onset   Breast cancer Mother 57   Alcohol abuse Father    Heart disease Father 37       MI   Hypertension Father    Colon cancer Brother        dx after 90   Breast cancer Daughter 53    Social History:  reports that she quit smoking about 29 years ago. Her smoking use included cigarettes. She started smoking about 44 years ago. She has a 7.5 pack-year smoking history. She has never used smokeless tobacco. She reports that she does not drink alcohol and does not use drugs.  Allergies:  Allergies  Allergen Reactions   Hydrocodone Hives, Shortness Of Breath and Itching   Lisinopril  Cough   Sulfa Antibiotics Hives    Medications: I have reviewed the patient's current medications.  Results for orders placed or performed during the hospital encounter of 06/14/23 (from the past 48 hours)  Renal function panel     Status: Abnormal   Collection Time: 06/18/23  5:36 AM  Result Value Ref Range   Sodium 141 135 - 145 mmol/L  Potassium 4.1 3.5 - 5.1 mmol/L   Chloride 110 98 - 111 mmol/L   CO2 22 22 - 32 mmol/L   Glucose, Bld 77 70 - 99 mg/dL    Comment: Glucose reference range applies only to samples taken after fasting for at least 8 hours.   BUN 27 (H) 8 - 23 mg/dL   Creatinine, Ser 9.23 0.44 - 1.00 mg/dL   Calcium  8.8 (L) 8.9 - 10.3 mg/dL   Phosphorus 3.1 2.5 - 4.6 mg/dL   Albumin 3.4 (L) 3.5 - 5.0 g/dL   GFR, Estimated >39 >39 mL/min    Comment: (NOTE) Calculated using the CKD-EPI Creatinine Equation (2021)    Anion gap 9 5 - 15    Comment: Performed at [facility], [address], [place], [place] [address]  Magnesium      Status: Abnormal   Collection Time: 06/18/23  5:36 AM   Result Value Ref Range   Magnesium  2.6 (H) 1.7 - 2.4 mg/dL    Comment: Performed at [facility], [address] [place], [place] [address]  CBC     Status: Abnormal   Collection Time: 06/18/23  5:36 AM  Result Value Ref Range   WBC 18.6 (H) 4.0 - 10.5 K/uL   RBC 3.65 (L) 3.87 - 5.11 MIL/uL   Hemoglobin 11.7 (L) 12.0 - 15.0 g/dL   HCT 65.1 (L) 63.9 - 53.9 %   MCV 95.3 80.0 - 100.0 fL   MCH 32.1 26.0 - 34.0 pg   MCHC 33.6 30.0 - 36.0 g/dL   RDW 85.9 88.4 - 84.4 %   Platelets 334 150 - 400 K/uL   nRBC 0.0 0.0 - 0.2 %    Comment: Performed at [facility], [address], [place], [place] [address]    DG Swallowing Func-Speech Pathology Result Date: 06/18/2023 Table formatting from the original result was not included. Modified Barium Swallow Study Patient Details Name: Rebecca [name] MRN: [id number] Date of Birth: 1956/03/17 Today's Date: 06/18/2023 HPI/PMH: HPI: Patient is a 68 year old female admitted to [facility] with asthma exacerbation, episodic coughing fits with severe dyspnea.  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  Concern was present for potential neck abscess but imaging was negative.  CT of neck did show diffuse idiopathic skeletal hyperostosis with prominent anterior osteophytes.  Swallow evaluation ordered.  SLP messaged MD requesting difference of clinical swallow evaluation given patient overtly coughing and choking with liquids and her cervical spine findings.  Obtain approval for MBS study.  Prior upper GI study was normal in 2013 patient has had prior lap band surgery. Clinical Impression: Clinical Impression: Patient with functional oral pharyngeal swallow ability without aspiration or penetration of any consistency tested. Motility was normal and there was no  oropharyngeal retention related to her DISH.  Swallow was strong and timely. Patient was noted during evaluation to start overtly coughing after swallow of thin without barium visualized in larynx or trachea.  Suspect this is potentially a reflux cough given she has history of reflux.  She reports she does not take a PPI prior to admission but currently is on PPI twice daily and SLP advised she follow a reflux prudent diet.  Of note after patient swallowed barium tablet upon esophageal sweep she appeared with barium tablet and thin barium momentarily halting at distal esophagus with sensation to pharynx. Delayed  dry swallow effectively cleared esophagus.  Patient questioned why coughing causes her to stop breathing, given history of reflux SLP questions if symptoms are consistent with laryngospasm associated with reflux.       SLP questions if pt may have esophageal web - that did not impair barium tablet flow.  Patient educated to findings of MBS using fluoroscopy loops for details and she reports understanding.  Factors that may increase risk of adverse event in presence of aspiration Noe & Lianne 2021): No data recorded Recommendations/Plan: Swallowing Evaluation Recommendations Swallowing Evaluation Recommendations Recommendations: PO diet PO Diet Recommendation: Regular; Thin liquids (Level 0) Liquid Administration via: Cup; Straw Medication Administration: Whole meds with liquid Supervision: Patient able to self-feed Swallowing strategies  : Slow rate; Small bites/sips; Follow solids with liquids; Multiple dry swallows after each bite/sip Postural changes: Position pt fully upright for meals; Stay upright 30-60 min after meals Oral care recommendations: Oral care BID (2x/day) Treatment Plan Treatment Plan Treatment recommendations: No treatment recommended at this time Recommendations Recommendations for follow up therapy are one component of a multi-disciplinary discharge planning process, led by the  attending physician.  Recommendations may be updated based on patient status, additional functional criteria and insurance authorization. Assessment: Orofacial Exam: Orofacial Exam Oral Cavity: Oral Hygiene: WFL Oral Cavity - Dentition: Adequate natural dentition Orofacial Anatomy: WFL Oral Motor/Sensory Function: WFL Anatomy: Anatomy: Suspected cervical osteophytes Boluses Administered: Boluses Administered Boluses Administered: Thin liquids (Level 0); Mildly thick liquids (Level 2, nectar thick); Puree; Solid  Oral Impairment Domain: Oral Impairment Domain Lip Closure: No labial escape Tongue control during bolus hold: Cohesive bolus between tongue to palatal seal Bolus preparation/mastication: Timely and efficient chewing and mashing Bolus transport/lingual motion: Brisk tongue motion Oral residue: Complete oral clearance Location of oral residue : N/A Initiation of pharyngeal swallow : Valleculae; Pyriform sinuses  Pharyngeal Impairment Domain: Pharyngeal Impairment Domain Soft palate elevation: No bolus between soft palate (SP)/pharyngeal wall (PW) Laryngeal elevation: Complete superior movement of thyroid  cartilage with complete approximation of arytenoids to epiglottic petiole Anterior hyoid excursion: Complete anterior movement Epiglottic movement: Complete inversion Laryngeal vestibule closure: Complete, no air/contrast in laryngeal vestibule Pharyngeal stripping wave : Present - complete Pharyngeal contraction (A/P view only): N/A Pharyngoesophageal segment opening: Complete distension and complete duration, no obstruction of flow Tongue base retraction: No contrast between tongue base and posterior pharyngeal wall (PPW) Pharyngeal residue: Complete pharyngeal clearance  Esophageal Impairment Domain: Esophageal Impairment Domain Esophageal clearance upright position: Esophageal retention with retrograde flow below pharyngoesophageal segment (PES) Pill: Pill Consistency administered: Thin liquids (Level 0)  Thin liquids (Level 0): [facility] Penetration/Aspiration Scale Score: Penetration/Aspiration Scale Score 1.  Material does not enter airway: Thin liquids (Level 0); Mildly thick liquids (Level 2, nectar thick); Puree; Solid; Pill Compensatory Strategies: Compensatory Strategies Compensatory strategies: No   General Information: No data recorded Diet Prior to this Study: Regular; Thin liquids (Level 0)   Temperature : Normal   Respiratory Status: WFL   Supplemental O2: None (Room air)   History of Recent Intubation: No  Behavior/Cognition: Alert; Cooperative; Pleasant mood Self-Feeding Abilities: Able to self-feed Baseline vocal quality/speech: Normal Volitional Cough: Able to elicit Volitional Swallow: Able to elicit Exam Limitations: No limitations Goal Planning: No data recorded No data recorded No data recorded Patient/Family Stated Goal: to get better- pt being given Gabapentin  by pulmonary per her statement - No data recorded Pain: Pain Assessment Pain Assessment: No/denies pain End of Session: Start Time:SLP Start Time (ACUTE ONLY): 1455 Stop Time: SLP Stop Time (  ACUTE ONLY): 1512 Time Calculation:SLP Time Calculation (min) (ACUTE ONLY): 17 min Charges: SLP Evaluations $ SLP Speech Visit: 1 Visit SLP Evaluations $MBS Swallow: 1 Procedure $Swallowing Treatment: 1 Procedure SLP visit diagnosis: SLP Visit Diagnosis: Dysphagia, unspecified (R13.10) Past Medical History: Past Medical History: Diagnosis Date  Arthritis   Asthma   Blindness, legal   L EYE  Breast cancer (HCC)   GERD (gastroesophageal reflux disease)   Hearing loss   Hyperlipidemia   Hypertension   Lapband May 2013 11/18/2011  Migraines   Obesity, Class III, BMI 40-49.9 (morbid obesity) (HCC) 06/03/2011  Pre-diabetes   Sleep apnea   uses c-pap  Vertigo  Past Surgical History: Past Surgical History: Procedure Laterality Date  ABDOMINAL HYSTERECTOMY  2006  fibroids  BREAST LUMPECTOMY WITH RADIOACTIVE SEED LOCALIZATION Left 04/21/2022  Procedure: LEFT BREAST  LUMPECTOMY WITH RADIOACTIVE SEED LOCALIZATION;  Surgeon: Belinda Cough, MD;  Location: [facility];  Service: General;  Laterality: Left;  BREATH TEK H PYLORI  08/17/2011  Procedure: BREATH TEK H PYLORI;  Surgeon: Cough KATHEE Lunger, MD;  Location: [facility];  Service: General;  Laterality: N/A;  PATIENT WILL COME AT 0715  COLONOSCOPY  2008  @ [facility]   EYE SURGERY    Patient unsure of surgery date. Left eye  KNEE SURGERY Right 1992  right knee arthroscopy  LAPAROSCOPIC GASTRIC BANDING  10/20/2011  Procedure: LAPAROSCOPIC GASTRIC BANDING;  Surgeon: Cough KATHEE Lunger, MD;  Location: [facility];  Service: General;  Laterality: N/A;  RE-EXCISION OF BREAST LUMPECTOMY Left 05/05/2022  Procedure: RE-EXCISION OF LEFT BREAST LUMPECTOMY SITE POSTERIOR MARGIN;  Surgeon: Belinda Cough, MD;  Location: [facility];  Service: General;  Laterality: Left;  TOTAL KNEE ARTHROPLASTY Right 08/18/2021  Procedure: TOTAL KNEE ARTHROPLASTY;  Surgeon: Melodi Lerner, MD;  [facility];  Service: Orthopedics;  Laterality: Right;  TOTAL KNEE REVISION Right 12/28/2022  Procedure: Right knee femoral versus total knee arthroplasty revision;  Surgeon: Melodi Lerner, MD;  Location[facility];  Service: Orthopedics;  Laterality: Right; [name], MS [facility] SLP Acute Rehab Services Office [phone] [name] 06/18/2023, 4:03 PM   ROS Blood pressure 139/70, pulse 93, temperature 98.7 F (37.1 C), temperature source Oral, resp. rate 18, height 5' 1 (1.549 m), weight 86.7 kg, SpO2 96%. Physical Exam Constitutional:      Appearance: She is well-developed.  HENT:     Head:     Comments: Nose open and NP clear. The OP/OC clear. The FOE vocal cords are clear with no lesions of the larynx or hypopharynx.  They appear to move normal., No dysfunction noted during exam. Subglottis looks normal.  Eyes:     Pupils: Pupils are equal, round, and reactive to light.  Musculoskeletal:     Cervical back: Normal range of  motion.  Neurological:     Mental Status: She is alert.       Assessment/Plan: Cough- she has a stridor closing off feeling when she goes into coughing spell. She does not have noise when at rest. She has had this since christmas. She has asthma. Ct scan of neck without lesions or tracheal stenosis. She is having some vocal dysfunction with the coughing but I see no lesions causing the coughing. She needs aggressive reflux treatment BID PPI and see laryngologist as outpatient if controlling the cough does resolve the issue  Norleen Notice 06/19/2023, 3:20 PM

## 2023-06-19 NOTE — Plan of Care (Signed)

## 2023-06-19 NOTE — Progress Notes (Signed)
   06/19/23 2006  BiPAP/CPAP/SIPAP  $ Non-Invasive Home Ventilator   (Pt prefer self placement machine all ready to be worn)  BiPAP/CPAP/SIPAP Pt Type Adult  BiPAP/CPAP/SIPAP DREAMSTATIOND  Mask Type Full face mask  Mask Size Medium  Respiratory Rate 18 breaths/min  FiO2 (%) 21 %  Patient Home Equipment No  Auto Titrate Yes (16/6)

## 2023-06-19 NOTE — Plan of Care (Signed)

## 2023-06-19 NOTE — Progress Notes (Addendum)
 [facility]                                                                              Rebecca [name], is a 68 y.o. female, DOB - 1955/11/30, FMW:[id number] Admit date - 06/14/2023    Outpatient Primary MD for the patient is Rebecca Clotilda SAUNDERS, MD  LOS - 4  days  Chief Complaint  Patient presents with   Shortness of Breath       Brief summary   Patient is a 68 year old female with asthma, breast cancer, HTN, HLD, obesity, OSA on CPAP.  And possible ILD presenting with increased shortness of breath and cough for about 3 weeks that has not improved after outpatient steroid taper, Zithromax  and breathing treatments, and admitted with acute asthma exacerbation.  Recent CT angio chest on 12/18 raise concern for early ILD.   In ED, vital stable.  No oxygen requirement but respiratory distress from severe cough and wheezing.  COVID-19, influenza and RSV PCR nonreactive.  CXR without acute finding.  Started on systemic steroid and breathing treatments.  Slowly improving but with intermittent cough fit and associated dyspnea.  PCCM consulted   Assessment & Plan    Principal Problem: Acute asthma exacerbation with episodic coughing spells, respiratory distress -Patient reports ongoing symptoms for last 3 weeks despite outpatient steroid taper and breathing treatments, antibiotics.  Followed by pulmonology.  Outpatient CT on 12/18 raised concern for early ILD.  On ACE inhibitor but not taken for 5 days. -RSV, flu, COVID-negative, respiratory virus panel negative -Avoid ACE inhibitor -CCM following, adding Neurontin  for the cough 3 times daily with plan to increase gradually if it helps -Continue IV Solu-Medrol , scheduled nebs, Yupelri , Brovana , Tessalon   -Allergic to hydrocodone, unable to add Tussionex or Hycodan cough syrup. -MBS completed 1/3, continue regular diet.  Advised patient to follow-up with GI once acute respiratory issues are resolved.  Continue PPI twice  daily -Discussed with ENT, Dr. Roark, will evaluate  Mild acute kidney injury -Creatinine 1.011/1, improved to 0.76 -Continue to hold HCTZ    Essential hypertension: -BP stable, continue amlodipine , hold HCTZ   Hypokalemia:  Resolved.   Leukocytosis -Likely due to steroids   OSA on CPAP -Continue CPAP   History of breast cancer -Continue home med   Class II obesity Estimated body mass index is 36.1 kg/m as calculated from the following:   Height as of this encounter: 5' 1 (1.549 m).   Weight as of this encounter: 86.7 kg.  Code Status:  DVT Prophylaxis:  enoxaparin  (LOVENOX ) injection 40 mg Start: 06/14/23 1800 SCDs Start: 06/14/23 1709   Level of Care: Level of care: Med-Surg Family Communication: Updated patient Disposition Plan:      Remains inpatient appropriate:      Procedures:    Consultants:   Pulmonology  Antimicrobials:   Anti-infectives (From admission, onward)    None          Medications  amLODipine   10 mg Oral Daily   arformoterol   15 mcg Nebulization BID   benzonatate   200 mg Oral TID   budesonide  (PULMICORT ) nebulizer solution  0.5 mg Nebulization BID  enoxaparin  (LOVENOX ) injection  40 mg Subcutaneous Q24H   gabapentin   100 mg Oral TID   guaiFENesin   600 mg Oral BID   methylPREDNISolone  (SOLU-MEDROL ) injection  80 mg Intravenous Q24H   montelukast   10 mg Oral QHS   pantoprazole   40 mg Oral BID   potassium chloride  SA  20 mEq Oral Daily   revefenacin   175 mcg Nebulization Daily   tamoxifen   20 mg Oral Daily      Subjective:   [name] was seen and examined today.  Feels slightly better although still having coughing spells with shortness of breath.  No fevers, chest pain, abdominal pain.   Objective:   Vitals:   06/19/23 0412 06/19/23 0858 06/19/23 1058 06/19/23 1257  BP: (!) 151/79  (!) 155/82 139/70  Pulse: 83   93  Resp: 18   18  Temp: 98.1 F (36.7 C)   98.7 F (37.1 C)  TempSrc:    Oral  SpO2: 96%  97%  96%  Weight:      Height:        Intake/Output Summary (Last 24 hours) at 06/19/2023 1332 Last data filed at 06/18/2023 2104 Gross per 24 hour  Intake 250 ml  Output --  Net 250 ml     Wt Readings from Last 3 Encounters:  06/14/23 86.7 kg  06/09/23 81.6 kg  06/04/23 84.9 kg   Physical Exam General: Alert and oriented x 3, ill-appearing, coughing spell during encounter Cardiovascular: S1 S2 clear, RRR.  Respiratory: Scattered rhonchi bilaterally Gastrointestinal: Soft, nontender, nondistended, NBS Ext: no pedal edema bilaterally Neuro: no new deficits Skin: No rashes Psych: anxious    Data Reviewed:  I have personally reviewed following labs    CBC Lab Results  Component Value Date   WBC 18.6 (H) 06/18/2023   RBC 3.65 (L) 06/18/2023   HGB 11.7 (L) 06/18/2023   HCT 34.8 (L) 06/18/2023   MCV 95.3 06/18/2023   MCH 32.1 06/18/2023   PLT 334 06/18/2023   MCHC 33.6 06/18/2023   RDW 14.0 06/18/2023   LYMPHSABS 3.9 06/14/2023   MONOABS 0.4 06/14/2023   EOSABS 0.1 06/14/2023   BASOSABS 0.0 06/14/2023     Last metabolic panel Lab Results  Component Value Date   NA 141 06/18/2023   K 4.1 06/18/2023   CL 110 06/18/2023   CO2 22 06/18/2023   BUN 27 (H) 06/18/2023   CREATININE 0.76 06/18/2023   GLUCOSE 77 06/18/2023   GFRNONAA >60 06/18/2023   GFRAA >90 10/20/2011   CALCIUM  8.8 (L) 06/18/2023   PHOS 3.1 06/18/2023   PROT 7.8 06/09/2023   ALBUMIN 3.4 (L) 06/18/2023   BILITOT 0.4 06/09/2023   ALKPHOS 48 06/09/2023   AST 29 06/09/2023   ALT 33 06/09/2023   ANIONGAP 9 06/18/2023    CBG (last 3)  No results for input(s): GLUCAP in the last 72 hours.    Coagulation Profile: No results for input(s): INR, PROTIME in the last 168 hours.   Radiology Studies: I have personally reviewed the imaging studies  DG Swallowing Func-Speech Pathology Result Date: 06/18/2023 Table formatting from the original result was not included. Modified Barium Swallow  Study Patient Details Name: Rebecca [name] MRN: [id number] Date of Birth: 01-Jul-1955 Today's Date: 06/18/2023 HPI/PMH: HPI: Patient is a 68 year old female admitted to [facility] with asthma exacerbation, episodic coughing fits with severe dyspnea.  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  Concern was present for potential neck abscess but imaging was negative.  CT of neck did show diffuse idiopathic skeletal hyperostosis with prominent anterior osteophytes.  Swallow evaluation ordered.  SLP messaged MD requesting difference of clinical swallow evaluation given patient overtly coughing and choking with liquids and her cervical spine findings.  Obtain approval for MBS study.  Prior upper GI study was normal in 2013 patient has had prior lap band surgery. Clinical Impression: Clinical Impression: Patient with functional oral pharyngeal swallow ability without aspiration or penetration of any consistency tested. Motility was normal and there was no oropharyngeal retention related to her DISH.  Swallow was strong and timely. Patient was noted during evaluation to start overtly coughing after swallow of thin without barium visualized in larynx or trachea.  Suspect this is potentially a reflux cough given she has history of reflux.  She reports she does not take a PPI prior to admission but currently is on PPI twice daily and SLP advised she follow a reflux prudent diet.  Of note after patient swallowed barium tablet upon esophageal sweep she appeared with barium tablet and thin barium momentarily halting at distal esophagus with sensation to pharynx. Delayed dry swallow effectively cleared esophagus.  Patient questioned why coughing causes her to stop breathing, given history of reflux SLP questions if symptoms are consistent with laryngospasm associated  with reflux.       SLP questions if pt may have esophageal web - that did not impair barium tablet flow.  Patient educated to findings of MBS using fluoroscopy loops for details and she reports understanding.  Factors that may increase risk of adverse event in presence of aspiration Noe & Lianne 2021): No data recorded Recommendations/Plan: Swallowing Evaluation Recommendations Swallowing Evaluation Recommendations Recommendations: PO diet PO Diet Recommendation: Regular; Thin liquids (Level 0) Liquid Administration via: Cup; Straw Medication Administration: Whole meds with liquid Supervision: Patient able to self-feed Swallowing strategies  : Slow rate; Small bites/sips; Follow solids with liquids; Multiple dry swallows after each bite/sip Postural changes: Position pt fully upright for meals; Stay upright 30-60 min after meals Oral care recommendations: Oral care BID (2x/day) Treatment Plan Treatment Plan Treatment recommendations: No treatment recommended at this time Recommendations Recommendations for follow up therapy are one component of a multi-disciplinary discharge planning process, led by the attending physician.  Recommendations may be updated based on patient status, additional functional criteria and insurance authorization. Assessment: Orofacial Exam: Orofacial Exam Oral Cavity: Oral Hygiene: WFL Oral Cavity - Dentition: Adequate natural dentition Orofacial Anatomy: WFL Oral Motor/Sensory Function: WFL Anatomy: Anatomy: Suspected cervical osteophytes Boluses Administered: Boluses Administered Boluses Administered: Thin liquids (Level 0); Mildly thick liquids (Level 2, nectar thick); Puree; Solid  Oral Impairment Domain: Oral Impairment Domain Lip Closure: No labial escape Tongue control during bolus hold: Cohesive bolus between tongue to palatal seal Bolus preparation/mastication: Timely and efficient chewing and mashing Bolus transport/lingual motion: Brisk tongue motion Oral residue: Complete  oral clearance Location of oral residue : N/A Initiation of pharyngeal swallow : Valleculae; Pyriform sinuses  Pharyngeal Impairment Domain: Pharyngeal Impairment Domain Soft palate elevation: No bolus between soft palate (SP)/pharyngeal wall (PW) Laryngeal elevation: Complete superior movement of thyroid  cartilage with complete approximation of arytenoids to epiglottic petiole Anterior hyoid excursion: Complete anterior movement Epiglottic movement: Complete inversion Laryngeal vestibule closure: Complete, no air/contrast in laryngeal vestibule Pharyngeal stripping wave : Present - complete Pharyngeal contraction (A/P view only): N/A Pharyngoesophageal segment opening: Complete distension and complete  duration, no obstruction of flow Tongue base retraction: No contrast between tongue base and posterior pharyngeal wall (PPW) Pharyngeal residue: Complete pharyngeal clearance  Esophageal Impairment Domain: Esophageal Impairment Domain Esophageal clearance upright position: Esophageal retention with retrograde flow below pharyngoesophageal segment (PES) Pill: Pill Consistency administered: Thin liquids (Level 0) Thin liquids (Level 0): [place] Penetration/Aspiration Scale Score: Penetration/Aspiration Scale Score 1.  Material does not enter airway: Thin liquids (Level 0); Mildly thick liquids (Level 2, nectar thick); Puree; Solid; Pill Compensatory Strategies: Compensatory Strategies Compensatory strategies: No   General Information: No data recorded Diet Prior to this Study: Regular; Thin liquids (Level 0)   Temperature : Normal   Respiratory Status: WFL   Supplemental O2: None (Room air)   History of Recent Intubation: No  Behavior/Cognition: Alert; Cooperative; Pleasant mood Self-Feeding Abilities: Able to self-feed Baseline vocal quality/speech: Normal Volitional Cough: Able to elicit Volitional Swallow: Able to elicit Exam Limitations: No limitations Goal Planning: No data recorded No data recorded No data recorded  Patient/Family Stated Goal: to get better- pt being given Gabapentin  by pulmonary per her statement - No data recorded Pain: Pain Assessment Pain Assessment: No/denies pain End of Session: Start Time:SLP Start Time (ACUTE ONLY): 1455 Stop Time: SLP Stop Time (ACUTE ONLY): 1512 Time Calculation:SLP Time Calculation (min) (ACUTE ONLY): 17 min Charges: SLP Evaluations $ SLP Speech Visit: 1 Visit SLP Evaluations $MBS Swallow: 1 Procedure $Swallowing Treatment: 1 Procedure SLP visit diagnosis: SLP Visit Diagnosis: Dysphagia, unspecified (R13.10) Past Medical History: Past Medical History: Diagnosis Date  Arthritis   Asthma   Blindness, legal   L EYE  Breast cancer (HCC)   GERD (gastroesophageal reflux disease)   Hearing loss   Hyperlipidemia   Hypertension   Lapband May 2013 11/18/2011  Migraines   Obesity, Class III, BMI 40-49.9 (morbid obesity) (HCC) 06/03/2011  Pre-diabetes   Sleep apnea   uses c-pap  Vertigo  Past Surgical History: Past Surgical History: Procedure Laterality Date  ABDOMINAL HYSTERECTOMY  2006  fibroids  BREAST LUMPECTOMY WITH RADIOACTIVE SEED LOCALIZATION Left 04/21/2022  Procedure: LEFT BREAST LUMPECTOMY WITH RADIOACTIVE SEED LOCALIZATION;  Surgeon: Belinda Cough, MD;  Location: [facility];  Service: General;  Laterality: Left;  BREATH TEK H PYLORI  08/17/2011  Procedure: BREATH TEK H PYLORI;  Surgeon: Cough KATHEE Lunger, MD;  Location: [facility];  Service: General;  Laterality: N/A;  PATIENT WILL COME AT 0715  COLONOSCOPY  2008  @ [facility]   EYE SURGERY    Patient unsure of surgery date. Left eye  KNEE SURGERY Right 1992  right knee arthroscopy  LAPAROSCOPIC GASTRIC BANDING  10/20/2011  Procedure: LAPAROSCOPIC GASTRIC BANDING;  Surgeon: Cough KATHEE Lunger, MD;  Location: [facility];  Service: General;  Laterality: N/A;  RE-EXCISION OF BREAST LUMPECTOMY Left 05/05/2022  Procedure: RE-EXCISION OF LEFT BREAST LUMPECTOMY SITE POSTERIOR MARGIN;  Surgeon: Belinda Cough, MD;  Location: [facility];  Service: General;  Laterality: Left;  TOTAL KNEE ARTHROPLASTY Right 08/18/2021  Procedure: TOTAL KNEE ARTHROPLASTY;  Surgeon: Melodi Lerner, MD;  Location: [facility];  Service: Orthopedics;  Laterality: Right;  TOTAL KNEE REVISION Right 12/28/2022  Procedure: Right knee femoral versus total knee arthroplasty revision;  Surgeon: Melodi Lerner, MD;  Location: [facility];  Service: Orthopedics;  Laterality: Right; [name], MS [facility] SLP Acute Rehab Services Office [phone] [name] 06/18/2023, 4:03 PM      [name] M.D. [facility] 06/19/2023, 1:32 PM  Available via Epic secure chat 7am-7pm After 7 pm,  please refer to night coverage provider listed on amion.

## 2023-06-20 DIAGNOSIS — I10 Essential (primary) hypertension: Secondary | ICD-10-CM | POA: Diagnosis not present

## 2023-06-20 DIAGNOSIS — J45901 Unspecified asthma with (acute) exacerbation: Secondary | ICD-10-CM | POA: Diagnosis not present

## 2023-06-20 DIAGNOSIS — K2100 Gastro-esophageal reflux disease with esophagitis, without bleeding: Secondary | ICD-10-CM | POA: Diagnosis not present

## 2023-06-20 DIAGNOSIS — J4541 Moderate persistent asthma with (acute) exacerbation: Secondary | ICD-10-CM | POA: Diagnosis not present

## 2023-06-20 DIAGNOSIS — G4733 Obstructive sleep apnea (adult) (pediatric): Secondary | ICD-10-CM | POA: Diagnosis not present

## 2023-06-20 MED ORDER — GABAPENTIN 100 MG PO CAPS
200.0000 mg | ORAL_CAPSULE | Freq: Three times a day (TID) | ORAL | Status: DC
  Administered 2023-06-20 – 2023-06-21 (×3): 200 mg via ORAL
  Filled 2023-06-20 (×3): qty 2

## 2023-06-20 NOTE — Progress Notes (Signed)
 Spacer left @ bedside for MDI.

## 2023-06-20 NOTE — Progress Notes (Signed)
 [facility]                                                                              Rebecca [name], is a 68 y.o. female, DOB - Nov 06, 1955, FMW:[id number] Admit date - 06/14/2023    Outpatient Primary MD for the patient is [name], MD  LOS - 5  days  Chief Complaint  Patient presents with   Shortness of Breath       Brief summary   Patient is a 68 year old female with asthma, breast cancer, HTN, HLD, obesity, OSA on CPAP.  And possible ILD presenting with increased shortness of breath and cough for about 3 weeks that has not improved after outpatient steroid taper, Zithromax  and breathing treatments, and admitted with acute asthma exacerbation.  Recent CT angio chest on 12/18 raise concern for early ILD.   In ED, vital stable.  No oxygen requirement but respiratory distress from severe cough and wheezing.  COVID-19, influenza and RSV PCR nonreactive.  CXR without acute finding.  Started on systemic steroid and breathing treatments.  Slowly improving but with intermittent cough fit and associated dyspnea.  PCCM consulted   Assessment & Plan    Principal Problem: Acute asthma exacerbation with episodic coughing spells, respiratory distress -Patient reports ongoing symptoms for last 3 weeks despite outpatient steroid taper and breathing treatments, antibiotics.  Followed by pulmonology.  Outpatient CT on 12/18 raised concern for early ILD.  On ACE inhibitor but not taken for 5 days. -RSV, flu, COVID-negative, respiratory virus panel negative -Avoid ACE inhibitor -CCM following, adding Neurontin  for the cough 3 times daily with plan to increase gradually if it helps.  Will increase Neurontin  to 200 mg 3 times daily -Transitioned to p.o. prednisone   -Continue Dulera , Tessalon , albuterol  nebs as needed -Allergic to hydrocodone, unable to add Tussionex or Hycodan cough syrup. -MBS completed 1/3, continue regular diet.  Advised patient to follow-up with GI  once acute respiratory issues are resolved.  Continue PPI twice daily -Evaluated by ENT, Dr. Roark, some VCD noted, continue twice daily PPI and outpatient follow-up  Mild acute kidney injury -Creatinine 1.011/1, improved to 0.76 -Continue to hold HCTZ    Essential hypertension: -BP stable, continue amlodipine , hold HCTZ   Hypokalemia:  Resolved.   Leukocytosis -Likely due to steroids, improving   OSA on CPAP -Continue CPAP   History of breast cancer -Continue home med   Class II obesity Estimated body mass index is 36.1 kg/m as calculated from the following:   Height as of this encounter: 5' 1 (1.549 m).   Weight as of this encounter: 86.7 kg.  Code Status:  DVT Prophylaxis:  enoxaparin  (LOVENOX ) injection 40 mg Start: 06/14/23 1800 SCDs Start: 06/14/23 1709   Level of Care: Level of care: Med-Surg Family Communication: Updated patient Disposition Plan:      Remains inpatient appropriate:   Hopefully, DC home in a.m. if continues to improve  Procedures:    Consultants:   Pulmonology  Antimicrobials:   Anti-infectives (From admission, onward)    None          Medications  amLODipine   10 mg Oral Daily  benzonatate   200 mg Oral TID   enoxaparin  (LOVENOX ) injection  40 mg Subcutaneous Q24H   gabapentin   100 mg Oral TID   guaiFENesin   600 mg Oral BID   mometasone -formoterol   2 puff Inhalation BID   montelukast   10 mg Oral QHS   pantoprazole   40 mg Oral BID   potassium chloride  SA  20 mEq Oral Daily   predniSONE   40 mg Oral Q breakfast   tamoxifen   20 mg Oral Daily      Subjective:   Rebecca [name] was seen and examined today.  Still been coughing spells with shortness of breath.  No fevers, chest pain, abdominal pain nausea vomiting.    Objective:   Vitals:   06/19/23 1938 06/20/23 0524 06/20/23 0749 06/20/23 1247  BP: (!) 147/75 (!) 140/73  110/68  Pulse: 84 69  86  Resp: 18 19    Temp: 98.2 F (36.8 C) 97.7 F (36.5 C)  98.8 F (37.1  C)  TempSrc: Oral Oral  Oral  SpO2: 97% 96% 96% 99%  Weight:      Height:       No intake or output data in the 24 hours ending 06/20/23 1427    Wt Readings from Last 3 Encounters:  06/14/23 86.7 kg  06/09/23 81.6 kg  06/04/23 84.9 kg   Physical Exam General: Alert and oriented x 3, NAD, episodic coughing. Cardiovascular: S1 S2 clear, RRR.  Respiratory: CTAB Gastrointestinal: Soft, nontender, nondistended, NBS Ext: no pedal edema bilaterally Neuro: no new deficits Psych: Normal affect       Data Reviewed:  I have personally reviewed following labs    CBC Lab Results  Component Value Date   WBC 18.6 (H) 06/18/2023   RBC 3.65 (L) 06/18/2023   HGB 11.7 (L) 06/18/2023   HCT 34.8 (L) 06/18/2023   MCV 95.3 06/18/2023   MCH 32.1 06/18/2023   PLT 334 06/18/2023   MCHC 33.6 06/18/2023   RDW 14.0 06/18/2023   LYMPHSABS 3.9 06/14/2023   MONOABS 0.4 06/14/2023   EOSABS 0.1 06/14/2023   BASOSABS 0.0 06/14/2023     Last metabolic panel Lab Results  Component Value Date   NA 141 06/18/2023   K 4.1 06/18/2023   CL 110 06/18/2023   CO2 22 06/18/2023   BUN 27 (H) 06/18/2023   CREATININE 0.76 06/18/2023   GLUCOSE 77 06/18/2023   GFRNONAA >60 06/18/2023   GFRAA >90 10/20/2011   CALCIUM  8.8 (L) 06/18/2023   PHOS 3.1 06/18/2023   PROT 7.8 06/09/2023   ALBUMIN 3.4 (L) 06/18/2023   BILITOT 0.4 06/09/2023   ALKPHOS 48 06/09/2023   AST 29 06/09/2023   ALT 33 06/09/2023   ANIONGAP 9 06/18/2023    CBG (last 3)  No results for input(s): GLUCAP in the last 72 hours.    Coagulation Profile: No results for input(s): INR, PROTIME in the last 168 hours.   Radiology Studies: I have personally reviewed the imaging studies  No results found.      [name] M.D. [facility] 06/20/2023, 2:27 PM  Available via Epic secure chat 7am-7pm After 7 pm, please refer to night coverage provider listed on amion.

## 2023-06-20 NOTE — Progress Notes (Signed)
   06/20/23 2044  BiPAP/CPAP/SIPAP  BiPAP/CPAP/SIPAP Pt Type Adult (Pt found on the cpap no issues./ Prefer self placement)  BiPAP/CPAP/SIPAP DREAMSTATIOND  Mask Type Full face mask  Mask Size Medium  Respiratory Rate 18 breaths/min  FiO2 (%) 21 %  Patient Home Equipment No  Auto Titrate Yes (16/6)  CPAP/SIPAP surface wiped down Yes

## 2023-06-20 NOTE — Progress Notes (Signed)
 NAME:  Rebecca [name], MRN:  [id number], DOB:  [birth year], LOS: 5 ADMISSION DATE:  06/14/2023, CONSULTATION DATE: 06/15/2023 REFERRING MD: Dr. Kathrin, CHIEF COMPLAINT: Shortness of breath, subacute cough  History of Present Illness:  68 yo F PMH HLD, HTN, OSA on CPAP, asthma, breast cancer s/p L lumpectomy and radiation (05/2022-06/2022) now on tamoxifen , who presented to [facility] 12/30 with CC cough and SOB. Cough/SOB has been ongoing for about 2.5- 3 weeks and she has seen multiple providers in this time span-- course detailed below.   12/18: She was seen in allergy clinic 06/02/23 with CC asthma flare starting a few days prior to office visit, associated productive cough, and a bit of chest pain.   Steroids were prescribed, a CXR and ddimer were obtained.  Cxr revealed some RUL densities and her ddimer was elevated to 2, so she was referred to ED for further evaluation and imaging. A CTA chest 06/02/23 was obtained in ED which was fortunately negative for PE but had some GGOs which could be suggestive of early interstitial lung disease. She was not having any s/sx indicating need for hospital admission and was dc home.    12/20: She was seen in follow up in pulm clinic 06/04/23 and in context of a recent/ongoing slow-to-resolve asthma flare, difficult to say. Was started on a Z pack and a steroid taper, with plan for return visit in 6-8 weeks+ PFTs.   12/25: She presented to ED 12/25 with cough and SOB + wheezing. Had finished her Z pack, was still taking prednisone , and felt that home nebs/MDI (albuteral, alvesco ) were not helping. CXR without She was rx cough meds and cefdinir , dc home.   12/27: She called pulm office 12/27 with report of ongoing cough. Add'l cough syrup was called in and she was advised to call office to schedule follow up sooner than previously planned.    12/30 (Current) She presented to [facility] ED 12/30 with CC cough, SOB. At this point she had completed a prednisone  taper and had  completed azithro.   Reports completing  cefdinir . She was wheezing in ED. Given IV steroids and duoneb with EMS, placed on cont neb for 1hr in ED. CXR without acute abnormality. She was admitted to [facility] to hospitalist service, Rx solumedrol. Is not requiring O2.    PCCM is consulted 06/15/23 in this setting   Pertinent  Medical History  OSA Asthma  Breast cancer  HTN  Significant Hospital Events: Including procedures, antibiotic start and stop dates in addition to other pertinent events   12/30 wheezing, admit to [facility]. BID steroids  12/31 PCCM consult   Interim History / Subjective:  Continues to have cough Breathing is improved Did not like how inhaler felt. Discussed benefits of spacer  Objective   Blood pressure (!) 140/73, pulse 69, temperature 97.7 F (36.5 C), temperature source Oral, resp. rate 19, height 5' 1 (1.549 m), weight 86.7 kg, SpO2 96%.    FiO2 (%):  [21 %] 21 %  No intake or output data in the 24 hours ending 06/20/23 1150  Filed Weights   06/14/23 1258  Weight: 86.7 kg   Physical Exam: General: Well-appearing, no acute distress HENT: [place], AT Eyes: EOMI, no scleral icterus Respiratory: Clear to auscultation bilaterally.  No crackles, wheezing or rales Cardiovascular: RRR, -M/R/G, no JVD Extremities:-Edema,-tenderness Neuro: AAO x4, CNII-XII grossly intact Psych: Normal mood, normal affect   Resolved Hospital Problem list     Assessment & Plan:   Asthma exacerbation -  on room air, resolving Spasmatic cough episodes - may be due to vocal cord dysfunction RVP, COVID, flu negative Did complete course of antibiotics as outpatient Normal inpatient swallow evaluation Plan -Continue PO prednisone  40 mg. Reduce by 10 mg every 3 days -Transition from nebulizers to Dulera  200-5 mcg. Discussed with RN to obtain spacer. -Discharge patient on this inhaler with 1-2 refills please. Pulmonary will need to determine as an outpatient what bronchodilators are  covered for ICS/LABA as an outpatient -Continue antitussives -Neurontin  added 1/3 for the cough-Neurontin  100 p.o. 3 times daily with plan to increase dose gradually if it helps.  May also be weaned off to discontinuation once cough completely resolves. This can be completed as an outpatient. Side effect to watch out for will be sleepiness tiredness, fatigue -Seen by ENT with exam demonstrating vocal cod dysfunction. ENT recommends BID PPI and follow-up as outpatient  Obstructive sleep apnea -Compliant with CPAP use  Care Time: 35 min  Slater Staff, M.D. [facility] Pulmonary/Critical Care Medicine 06/20/2023 11:50 AM   See Amion for personal pager For hours between 7 PM to 7 AM, please call Elink for urgent questions

## 2023-06-20 NOTE — Plan of Care (Signed)

## 2023-06-21 ENCOUNTER — Other Ambulatory Visit (HOSPITAL_COMMUNITY): Payer: Self-pay

## 2023-06-21 ENCOUNTER — Telehealth (HOSPITAL_COMMUNITY): Payer: Self-pay | Admitting: Pharmacy Technician

## 2023-06-21 DIAGNOSIS — K2100 Gastro-esophageal reflux disease with esophagitis, without bleeding: Secondary | ICD-10-CM | POA: Diagnosis not present

## 2023-06-21 DIAGNOSIS — I10 Essential (primary) hypertension: Secondary | ICD-10-CM | POA: Diagnosis not present

## 2023-06-21 DIAGNOSIS — J45901 Unspecified asthma with (acute) exacerbation: Secondary | ICD-10-CM | POA: Diagnosis not present

## 2023-06-21 DIAGNOSIS — J4551 Severe persistent asthma with (acute) exacerbation: Secondary | ICD-10-CM | POA: Diagnosis not present

## 2023-06-21 DIAGNOSIS — J4541 Moderate persistent asthma with (acute) exacerbation: Secondary | ICD-10-CM | POA: Diagnosis not present

## 2023-06-21 DIAGNOSIS — G4733 Obstructive sleep apnea (adult) (pediatric): Secondary | ICD-10-CM | POA: Diagnosis not present

## 2023-06-21 MED ORDER — GABAPENTIN 100 MG PO CAPS
100.0000 mg | ORAL_CAPSULE | Freq: Three times a day (TID) | ORAL | Status: DC
  Administered 2023-06-21 – 2023-06-23 (×6): 100 mg via ORAL
  Filled 2023-06-21 (×6): qty 1

## 2023-06-21 MED ORDER — AZELASTINE HCL 0.1 % NA SOLN
2.0000 | Freq: Two times a day (BID) | NASAL | Status: DC
  Administered 2023-06-21 – 2023-06-23 (×5): 2 via NASAL
  Filled 2023-06-21: qty 30

## 2023-06-21 NOTE — Progress Notes (Signed)
 NAME:  Rebecca [name], MRN:  [id number], DOB:  1955/08/24, LOS: 6 ADMISSION DATE:  06/14/2023, CONSULTATION DATE: 06/15/2023 REFERRING MD: Dr. Kathrin, CHIEF COMPLAINT: Shortness of breath, subacute cough  History of Present Illness:  68 yo F PMH HLD, HTN, OSA on CPAP, asthma, breast cancer s/p L lumpectomy and radiation (05/2022-06/2022) now on tamoxifen , who presented to [facility] 12/30 with CC cough and SOB. Cough/SOB has been ongoing for about 2.5- 3 weeks and she has seen multiple providers in this time span-- course detailed below.   12/18: She was seen in allergy clinic 06/02/23 with CC asthma flare starting a few days prior to office visit, associated productive cough, and a bit of chest pain.   Steroids were prescribed, a CXR and ddimer were obtained.  Cxr revealed some RUL densities and her ddimer was elevated to 2, so she was referred to ED for further evaluation and imaging. A CTA chest 06/02/23 was obtained in ED which was fortunately negative for PE but had some GGOs which could be suggestive of early interstitial lung disease. She was not having any s/sx indicating need for hospital admission and was dc home.    12/20: She was seen in follow up in pulm clinic 06/04/23 and in context of a recent/ongoing slow-to-resolve asthma flare, difficult to say. Was started on a Z pack and a steroid taper, with plan for return visit in 6-8 weeks+ PFTs.   12/25: She presented to ED 12/25 with cough and SOB + wheezing. Had finished her Z pack, was still taking prednisone , and felt that home nebs/MDI (albuteral, alvesco ) were not helping. CXR without She was rx cough meds and cefdinir , dc home.   12/27: She called pulm office 12/27 with report of ongoing cough. Add'l cough syrup was called in and she was advised to call office to schedule follow up sooner than previously planned.    12/30 (Current) She presented to [facility] ED 12/30 with CC cough, SOB. At this point she had completed a prednisone  taper and had  completed azithro.   Reports completing  cefdinir . She was wheezing in ED. Given IV steroids and duoneb with EMS, placed on cont neb for 1hr in ED. CXR without acute abnormality. She was admitted to [facility] to hospitalist service, Rx solumedrol. Is not requiring O2.    PCCM is consulted 06/15/23 in this setting   Pertinent  Medical History  OSA Asthma  Breast cancer  HTN  Significant Hospital Events: Including procedures, antibiotic start and stop dates in addition to other pertinent events   12/30 wheezing, admit to [facility]. BID steroids  12/31 PCCM consult   Interim History / Subjective:  Continues to have cough. Having nasal congestion - worsens during coughing spells On room air  Objective   Blood pressure 103/80, pulse 67, temperature (!) 97.2 F (36.2 C), resp. rate 17, height 5' 1 (1.549 m), weight 86.7 kg, SpO2 98%.    FiO2 (%):  [21 %] 21 %  No intake or output data in the 24 hours ending 06/21/23 0737  Filed Weights   06/14/23 1258  Weight: 86.7 kg   Physical Exam: General: Well-appearing, no acute distress HENT: [place], AT Eyes: EOMI, no scleral icterus Respiratory: Clear to auscultation bilaterally.  No crackles, wheezing or rales Cardiovascular: RRR, -M/R/G, no JVD Extremities:-Edema,-tenderness Neuro: AAO x4, CNII-XII grossly intact Psych: Normal mood, normal affect    Resolved Hospital Problem list     Assessment & Plan:   Asthma exacerbation - on room air,  resolving Spasmatic cough episodes - may be due to vocal cord dysfunction RVP, COVID, flu negative Did complete course of antibiotics as outpatient Normal inpatient swallow evaluation Plan -Continue PO prednisone  40 mg. Reduce by 10 mg every 3 days -Continue Dulera  200-5 mcg with spacer while inpatient. I provided bedside inhaler teaching with spacer.  >On discharge please change inhaler to Breyna  165-4.5 mcg TWO puffs BID with 2 refills  >Next pulmonary appointment on 08/11/23 -Continue neurontin  100 mg  TID. Will titrate as an outpatient. Hold on increasing for now due to mild drowsiness in afternoon -Seen by ENT with exam demonstrating vocal cod dysfunction. ENT recommends BID PPI and follow-up as outpatient -Start daily nasal srpays  Obstructive sleep apnea -Compliant with CPAP use  Care Time: 36 min  Pulmonary will continue follow  Slater Staff, M.D. [facility] Pulmonary/Critical Care Medicine 06/21/2023 7:37 AM   See Amion for personal pager For hours between 7 PM to 7 AM, please call Elink for urgent questions

## 2023-06-21 NOTE — Progress Notes (Signed)
 Mobility Specialist - Progress Note   06/21/23 1442  Mobility  Activity Ambulated independently in hallway  Level of Assistance Independent  Assistive Device None  Distance Ambulated (ft) 250 ft  Activity Response Tolerated well  Mobility Referral Yes  Mobility visit 1 Mobility  Mobility Specialist Start Time (ACUTE ONLY) 1432  Mobility Specialist Stop Time (ACUTE ONLY) 1441  Mobility Specialist Time Calculation (min) (ACUTE ONLY) 9 min   Pt received in bed and agreeable to mobility. No complaints during session. Pt to bed after session with all needs met.    [facility]

## 2023-06-21 NOTE — Progress Notes (Signed)
 Mobility Specialist - Progress Note   06/21/23 0959  Mobility  Activity Ambulated independently in hallway  Level of Assistance Independent  Assistive Device None  Distance Ambulated (ft) 280 ft  Activity Response Tolerated well  Mobility Referral Yes  Mobility visit 1 Mobility  Mobility Specialist Start Time (ACUTE ONLY) 0949  Mobility Specialist Stop Time (ACUTE ONLY) 0958  Mobility Specialist Time Calculation (min) (ACUTE ONLY) 9 min   Pt received EOB and agreeable to mobility. No complaints during session. Pt to EOB after session with all needs met.    [facility]

## 2023-06-21 NOTE — Progress Notes (Signed)
 [facility]                                                                              Rebecca [name], is a 68 y.o. female, DOB - 09/27/1955, FMW:[id number] Admit date - 06/14/2023    Outpatient Primary MD for the patient is [name], MD  LOS - 6  days  Chief Complaint  Patient presents with   Shortness of Breath       Brief summary   Patient is a 68 year old female with asthma, breast cancer, HTN, HLD, obesity, OSA on CPAP.  And possible ILD presenting with increased shortness of breath and cough for about 3 weeks that has not improved after outpatient steroid taper, Zithromax  and breathing treatments, and admitted with acute asthma exacerbation.  Recent CT angio chest on 12/18 raise concern for early ILD.   In ED, vital stable.  No oxygen requirement but respiratory distress from severe cough and wheezing.  COVID-19, influenza and RSV PCR nonreactive.  CXR without acute finding.  Started on systemic steroid and breathing treatments.  Slowly improving but with intermittent cough fit and associated dyspnea.  PCCM consulted   Assessment & Plan    Principal Problem: Acute asthma exacerbation with episodic coughing spells, respiratory distress -Patient reports ongoing symptoms for last 3 weeks despite outpatient steroid taper and breathing treatments, antibiotics.  Followed by pulmonology.  Outpatient CT on 12/18 raised concern for early ILD.  On ACE inhibitor but not taken for 5 days PTA. -RSV, flu, COVID-negative, respiratory virus panel negative -Avoid ACE inhibitor -CCM following, added Neurontin  daily with plan to increase gradually if it helps.  Increased to Neurontin  200 mg 3 times daily but had mild drowsiness yesterday -Transitioned Neurontin  back to 100 mg 3 times daily. -Transitioned to p.o. prednisone   -Inpatient, continue Dulera , Tessalon , albuterol  nebs as needed -Allergic to hydrocodone, unable to add Tussionex or Hycodan cough syrup. -MBS  completed 1/3, continue regular diet.  Advised patient to follow-up with GI once acute respiratory issues are resolved.  Continue PPI twice daily -Evaluated by ENT, Dr. Roark, some VCD noted, continue twice daily PPI and outpatient follow-up -Per pulmonology, continue p.o. prednisone  40 mg daily, reduce by 10 mg every 3 days.  Upon discharge, recommended Breyna  165-4.5 mcg 2 puffs twice daily, pulmonary appointment scheduled 2/26   Mild acute kidney injury -Creatinine 1.011/1, improved to 0.76 -Continue to hold HCTZ    Essential hypertension: -BP stable, continue amlodipine , hold HCTZ   Hypokalemia:  Resolved.   Leukocytosis -Likely due to steroids, improving   OSA on CPAP -Continue CPAP   History of breast cancer -Continue home med   Class II obesity Estimated body mass index is 36.1 kg/m as calculated from the following:   Height as of this encounter: 5' 1 (1.549 m).   Weight as of this encounter: 86.7 kg.  Code Status:  DVT Prophylaxis:  enoxaparin  (LOVENOX ) injection 40 mg Start: 06/14/23 1800 SCDs Start: 06/14/23 1709   Level of Care: Level of care: Med-Surg Family Communication: Updated patient's son at the bedside Disposition Plan:       Patient has had significant improvement in  the last 3 days.  However she does not want to go home today, states for religious reasons and she is scared to go home.  Procedures:    Consultants:   Pulmonology  Antimicrobials:   Anti-infectives (From admission, onward)    None          Medications  amLODipine   10 mg Oral Daily   azelastine   2 spray Each Nare BID   benzonatate   200 mg Oral TID   enoxaparin  (LOVENOX ) injection  40 mg Subcutaneous Q24H   gabapentin   100 mg Oral TID   guaiFENesin   600 mg Oral BID   mometasone -formoterol   2 puff Inhalation BID   montelukast   10 mg Oral QHS   pantoprazole   40 mg Oral BID   potassium chloride  SA  20 mEq Oral Daily   predniSONE   40 mg Oral Q breakfast   tamoxifen   20  mg Oral Daily      Subjective:   Rebecca [name] was seen and examined today.  No coughing spells with respiratory distress during the encounter, appears to have significant improvement in the last 24 to 48 hours.  No fevers, nausea vomiting, abdominal pain.  Son at the bedside.   Objective:   Vitals:   06/20/23 2040 06/21/23 0423 06/21/23 0802 06/21/23 1220  BP: (!) 118/57 103/80  121/70  Pulse: 78 67  92  Resp: 18 17  16   Temp: 98.6 F (37 C) (!) 97.2 F (36.2 C)  98.8 F (37.1 C)  TempSrc: Oral     SpO2: 97% 98% 98% 96%  Weight:      Height:        Intake/Output Summary (Last 24 hours) at 06/21/2023 1336 Last data filed at 06/21/2023 0830 Gross per 24 hour  Intake 240 ml  Output --  Net 240 ml      Wt Readings from Last 3 Encounters:  06/14/23 86.7 kg  06/09/23 81.6 kg  06/04/23 84.9 kg   Physical Exam General: Alert and oriented x 3, NAD Cardiovascular: S1 S2 clear, RRR.  Respiratory: CTAB, no wheezing, rales or rhonchi Gastrointestinal: Soft, nontender, nondistended, NBS Ext: no pedal edema bilaterally Neuro: no new deficits Skin: No rashes Psych: Normal affect      Data Reviewed:  I have personally reviewed following labs    CBC Lab Results  Component Value Date   WBC 18.6 (H) 06/18/2023   RBC 3.65 (L) 06/18/2023   HGB 11.7 (L) 06/18/2023   HCT 34.8 (L) 06/18/2023   MCV 95.3 06/18/2023   MCH 32.1 06/18/2023   PLT 334 06/18/2023   MCHC 33.6 06/18/2023   RDW 14.0 06/18/2023   LYMPHSABS 3.9 06/14/2023   MONOABS 0.4 06/14/2023   EOSABS 0.1 06/14/2023   BASOSABS 0.0 06/14/2023     Last metabolic panel Lab Results  Component Value Date   NA 141 06/18/2023   K 4.1 06/18/2023   CL 110 06/18/2023   CO2 22 06/18/2023   BUN 27 (H) 06/18/2023   CREATININE 0.76 06/18/2023   GLUCOSE 77 06/18/2023   GFRNONAA >60 06/18/2023   GFRAA >90 10/20/2011   CALCIUM  8.8 (L) 06/18/2023   PHOS 3.1 06/18/2023   PROT 7.8 06/09/2023   ALBUMIN 3.4 (L)  06/18/2023   BILITOT 0.4 06/09/2023   ALKPHOS 48 06/09/2023   AST 29 06/09/2023   ALT 33 06/09/2023   ANIONGAP 9 06/18/2023    CBG (last 3)  No results for input(s): GLUCAP in the last 72 hours.  Coagulation Profile: No results for input(s): INR, PROTIME in the last 168 hours.   Radiology Studies: I have personally reviewed the imaging studies  No results found.      [name] M.D. [facility] 06/21/2023, 1:36 PM  Available via Epic secure chat 7am-7pm After 7 pm, please refer to night coverage provider listed on amion.

## 2023-06-21 NOTE — Telephone Encounter (Signed)
 Patient Product/process Development Scientist completed.    The patient is insured through [employer] [facility]. Patient has [employer], may use a copay card, and/or apply for patient assistance if available.    Ran test claim for budesonide -formoterol  160-4.5 mcg and the current 30 day co-pay is $9.00.  Ran test claim for budesonide -salmeterol 250-50 mcg and the current 30 day co-pay is $9.00.  Ran test claim for Advair HFA 230-21 mcg and Product Not Covered   This test claim was processed through [employer]- copay amounts may vary at other pharmacies due to [employer], or as the patient moves through the different stages of their insurance plan.     Reyes Sharps, CPHT Pharmacy Technician III Certified Patient Advocate [facility] Pharmacy Patient Advocate Team Direct Number: [phone]  Fax: [phone]

## 2023-06-21 NOTE — Plan of Care (Signed)

## 2023-06-22 ENCOUNTER — Other Ambulatory Visit (HOSPITAL_COMMUNITY): Payer: Self-pay

## 2023-06-22 DIAGNOSIS — G4733 Obstructive sleep apnea (adult) (pediatric): Secondary | ICD-10-CM | POA: Diagnosis not present

## 2023-06-22 DIAGNOSIS — J4541 Moderate persistent asthma with (acute) exacerbation: Secondary | ICD-10-CM | POA: Diagnosis not present

## 2023-06-22 DIAGNOSIS — J45901 Unspecified asthma with (acute) exacerbation: Secondary | ICD-10-CM | POA: Diagnosis not present

## 2023-06-22 DIAGNOSIS — J4551 Severe persistent asthma with (acute) exacerbation: Secondary | ICD-10-CM | POA: Diagnosis not present

## 2023-06-22 DIAGNOSIS — K2100 Gastro-esophageal reflux disease with esophagitis, without bleeding: Secondary | ICD-10-CM | POA: Diagnosis not present

## 2023-06-22 DIAGNOSIS — I10 Essential (primary) hypertension: Secondary | ICD-10-CM | POA: Diagnosis not present

## 2023-06-22 LAB — BASIC METABOLIC PANEL
Anion gap: 9 (ref 5–15)
BUN: 25 mg/dL — ABNORMAL HIGH (ref 8–23)
CO2: 20 mmol/L — ABNORMAL LOW (ref 22–32)
Calcium: 8.6 mg/dL — ABNORMAL LOW (ref 8.9–10.3)
Chloride: 109 mmol/L (ref 98–111)
Creatinine, Ser: 0.85 mg/dL (ref 0.44–1.00)
GFR, Estimated: >60 mL/min (ref >60)
Glucose, Bld: 74 mg/dL (ref 70–99)
Potassium: 3.7 mmol/L (ref 3.5–5.1)
Sodium: 138 mmol/L (ref 135–145)

## 2023-06-22 LAB — CBC
HCT: 35.8 % — ABNORMAL LOW (ref 36.0–46.0)
Hemoglobin: 11.6 g/dL — ABNORMAL LOW (ref 12.0–15.0)
MCH: 31.2 pg (ref 26.0–34.0)
MCHC: 32.4 g/dL (ref 30.0–36.0)
MCV: 96.2 fL (ref 80.0–100.0)
Platelets: 289 10*3/uL (ref 150–400)
RBC: 3.72 MIL/uL — ABNORMAL LOW (ref 3.87–5.11)
RDW: 14.3 % (ref 11.5–15.5)
WBC: 17.9 10*3/uL — ABNORMAL HIGH (ref 4.0–10.5)
nRBC: 0 % (ref 0.0–0.2)

## 2023-06-22 MED ORDER — PREDNISONE 10 MG PO TABS: 10.0000 mg | ORAL_TABLET | Freq: Every day | ORAL | Status: DC

## 2023-06-22 MED ORDER — ARFORMOTEROL TARTRATE 15 MCG/2ML IN NEBU
15.0000 ug | INHALATION_SOLUTION | Freq: Two times a day (BID) | RESPIRATORY_TRACT | Status: DC
  Administered 2023-06-22 – 2023-06-23 (×3): 15 ug via RESPIRATORY_TRACT
  Filled 2023-06-22 (×3): qty 2

## 2023-06-22 MED ORDER — BUDESONIDE 0.25 MG/2ML IN SUSP
0.2500 mg | Freq: Two times a day (BID) | RESPIRATORY_TRACT | Status: DC
  Administered 2023-06-22 – 2023-06-23 (×3): 0.25 mg via RESPIRATORY_TRACT
  Filled 2023-06-22 (×3): qty 2

## 2023-06-22 MED ORDER — PREDNISONE 20 MG PO TABS: 20.0000 mg | ORAL_TABLET | Freq: Every day | ORAL | Status: DC

## 2023-06-22 MED ORDER — PREDNISONE 20 MG PO TABS
30.0000 mg | ORAL_TABLET | Freq: Every day | ORAL | Status: DC
  Administered 2023-06-23: 30 mg via ORAL
  Filled 2023-06-22: qty 1

## 2023-06-22 NOTE — Progress Notes (Signed)
 NAME:  Rebecca [name], MRN:  [id number], DOB:  1955/07/07, LOS: 7 ADMISSION DATE:  06/14/2023, CONSULTATION DATE: 06/15/2023 REFERRING MD: Dr. Kathrin, CHIEF COMPLAINT: Shortness of breath, subacute cough  History of Present Illness:  68 yo F PMH HLD, HTN, OSA on CPAP, asthma, breast cancer s/p L lumpectomy and radiation (05/2022-06/2022) now on tamoxifen , who [facility] with CC cough and SOB. Cough/SOB has been ongoing for about 2.5- 3 weeks and she has seen multiple providers in this time span-- course detailed below.   12/18: She was seen in allergy clinic 06/02/23 with CC asthma flare starting a few days prior to office visit, associated productive cough, and a bit of chest pain.   Steroids were prescribed, a CXR and ddimer were obtained.  Cxr revealed some RUL densities and her ddimer was elevated to 2, so she was referred to ED for further evaluation and imaging. A CTA chest 06/02/23 was obtained in ED which was fortunately negative for PE but had some GGOs which could be suggestive of early interstitial lung disease. She was not having any s/sx indicating need for hospital admission and was dc home.    12/20: She was seen in follow up in pulm clinic 06/04/23 and in context of a recent/ongoing slow-to-resolve asthma flare, difficult to say. Was started on a Z pack and a steroid taper, with plan for return visit in 6-8 weeks+ PFTs.   12/25: She presented to ED 12/25 with cough and SOB + wheezing. Had finished her Z pack, was still taking prednisone , and felt that home nebs/MDI (albuteral, alvesco ) were not helping. CXR without She was rx cough meds and cefdinir , dc home.   12/27: She called pulm office 12/27 with report of ongoing cough. Add'l cough syrup was called in and she was advised to call office to schedule follow up sooner than previously planned.    12/30 (Current) She presented to [facility] ED 12/30 with CC cough, SOB. At this point she had completed a prednisone  taper and had  completed azithro.   Reports completing  cefdinir . She was wheezing in ED. Given IV steroids and duoneb with EMS, placed on cont neb for 1hr in ED. CXR without acute abnormality. She was admitted to [facility] to hospitalist service, Rx solumedrol. Is not requiring O2.    PCCM is consulted 06/15/23 in this setting   Pertinent  Medical History  OSA Asthma  Breast cancer  HTN  Significant Hospital Events: Including procedures, antibiotic start and stop dates in addition to other pertinent events   12/30 wheezing, admit to [facility]. BID steroids  12/31 PCCM consult   Interim History / Subjective:  Remains on room air Not tolerating Dulera  even with spacer. Prefers nebulizer  Objective   Blood pressure (!) 111/51, pulse 78, temperature 97.8 F (36.6 C), resp. rate 20, height 5' 1 (1.549 m), weight 86.7 kg, SpO2 99%.    FiO2 (%):  [21 %] 21 %   Intake/Output Summary (Last 24 hours) at 06/22/2023 0825 Last data filed at 06/21/2023 0830 Gross per 24 hour  Intake 240 ml  Output --  Net 240 ml    Filed Weights   06/14/23 1258  Weight: 86.7 kg   Physical Exam: General: Well-appearing, no acute distress HENT: [place], AT Eyes: EOMI, no scleral icterus Respiratory: Clear to auscultation bilaterally.  No crackles, wheezing or rales Cardiovascular: RRR, -M/R/G, no JVD Extremities:-Edema,-tenderness Neuro: AAO x4, CNII-XII grossly intact Psych: Normal mood, normal affect  Resolved Hospital Problem list  Assessment & Plan:   Asthma exacerbation - on room air, resolving Spasmatic cough episodes - may be due to vocal cord dysfunction  RVP, COVID, flu negative Did complete course of antibiotics as outpatient Normal inpatient swallow evaluation CTA with subtle subpleural reticular and GGO for possible ILD. Doubt this is causing the spasmatic coughing but will need outpatient follow-up with PFTs and chest imaging Plan -Continue PO prednisone  40 mg. Reduce by 10 mg every 3 days. Taper  ordered -STOP Dulera . Restart Pulmicort  and Brovana  BID per patient preference  >On discharge order generic Pulmicort  and generic Brovana  (insurance covered)  >Next pulmonary appointment on 08/11/23 -Continue neurontin  100 mg TID. Will titrate as an outpatient. Hold on increasing for now due to mild drowsiness in afternoon -Seen by ENT with exam demonstrating vocal code dysfunction. ENT recommends BID PPI and follow-up as outpatient -Start daily nasal sprays -Will follow-up end of Feb with Dr. Geronimo. Can discuss symptoms and consider repeat chest imaging/PFTs at that time  Obstructive sleep apnea -Compliant with CPAP use  Care Time: 35 min  Pulmonary available as needed  Slater Staff, M.D. [facility] Pulmonary/Critical Care Medicine 06/22/2023 8:28 AM   See Amion for personal pager For hours between 7 PM to 7 AM, please call Elink for urgent questions

## 2023-06-22 NOTE — Progress Notes (Signed)
   06/22/23 0000  BiPAP/CPAP/SIPAP  BiPAP/CPAP/SIPAP Pt Type Adult  BiPAP/CPAP/SIPAP DREAMSTATIOND  Mask Type Full face mask  Mask Size Medium  FiO2 (%) 21 %  Patient Home Equipment No  Auto Titrate Yes (6-16)

## 2023-06-22 NOTE — Progress Notes (Signed)
   06/22/23 2042  BiPAP/CPAP/SIPAP  BiPAP/CPAP/SIPAP Pt Type Adult (Patient prefers self placement when ready and is comfortable putting the mask on herself.  Machine on and within reach at bedside.)  BiPAP/CPAP/SIPAP DREAMSTATIOND  Mask Type Full face mask  Mask Size Medium  FiO2 (%) 21 %  Patient Home Equipment No  Auto Titrate Yes (AutoMode 6-16 cm H2O)  CPAP/SIPAP surface wiped down Yes  BiPAP/CPAP /SiPAP Vitals  Bilateral Breath Sounds Clear;Diminished

## 2023-06-22 NOTE — Plan of Care (Signed)

## 2023-06-22 NOTE — Plan of Care (Signed)
  Problem: Health Behavior/Discharge Planning: Goal: Ability to manage health-related needs will improve Outcome: Progressing   Problem: Activity: Goal: Risk for activity intolerance will decrease Outcome: Progressing   Problem: Coping: Goal: Level of anxiety will decrease Outcome: Progressing   Problem: Pain Management: Goal: General experience of comfort will improve Outcome: Progressing

## 2023-06-22 NOTE — Progress Notes (Signed)
 [facility]                                                                              Rebecca [name], is a 68 y.o. female, DOB - 14-Sep-1955, FMW:[id number] Admit date - 06/14/2023    Outpatient Primary MD for the patient is [name], MD  LOS - 7  days  Chief Complaint  Patient presents with   Shortness of Breath       Brief summary   Patient is a 68 year old female with asthma, breast cancer, HTN, HLD, obesity, OSA on CPAP.  And possible ILD presenting with increased shortness of breath and cough for about 3 weeks that has not improved after outpatient steroid taper, Zithromax  and breathing treatments, and admitted with acute asthma exacerbation.  Recent CT angio chest on 12/18 raise concern for early ILD.   In ED, vital stable.  No oxygen requirement but respiratory distress from severe cough and wheezing.  COVID-19, influenza and RSV PCR nonreactive.  CXR without acute finding.  Started on systemic steroid and breathing treatments.  Slowly improving but with intermittent cough fit and associated dyspnea.  PCCM consulted   Assessment & Plan    Principal Problem: Acute asthma exacerbation with episodic coughing spells, respiratory distress -Patient reports ongoing symptoms for last 3 weeks despite outpatient steroid taper and breathing treatments, antibiotics.  Followed by pulmonology.  Outpatient CT on 12/18 raised concern for early ILD.  On ACE inhibitor but not taken for 5 days PTA. -RSV, flu, COVID-negative, respiratory virus panel negative -Avoid ACE inhibitor -CCM following, Neurontin  back to 100 mg 3 times daily.  Increase outpatient if needed -Transitioned to p.o. prednisone   --Allergic to hydrocodone, unable to add Tussionex or Hycodan cough syrup. -MBS completed 1/3, continue regular diet.  Advised patient to follow-up with GI once acute respiratory issues are resolved.  Continue PPI twice daily -Evaluated by ENT, Dr. Roark, some VCD noted,  continue twice daily PPI and outpatient follow-up -Per pulmonology, continue p.o. prednisone  40 mg daily, reduce by 10 mg every 3 days. pulmonary appointment scheduled 2/26 -Recommended on discharge order generic Pulmicort  and Brovana , twice daily.  Dulera  stopped.   Mild acute kidney injury -Creatinine 1.05 on 06/16/2023 -Continue to hold HCTZ    Essential hypertension: -BP stable, continue amlodipine , hold HCTZ   Hypokalemia:  Resolved.   Leukocytosis -Likely due to steroids, improving   OSA on CPAP -Continue CPAP   History of breast cancer -Continue home med   Class II obesity Estimated body mass index is 36.1 kg/m as calculated from the following:   Height as of this encounter: 5' 1 (1.549 m).   Weight as of this encounter: 86.7 kg.  Code Status:  DVT Prophylaxis:  enoxaparin  (LOVENOX ) injection 40 mg Start: 06/14/23 1800 SCDs Start: 06/14/23 1709   Level of Care: Level of care: Med-Surg Family Communication: Updated patient's son at the bedside on 1/6 Disposition Plan:     Procedures:    Consultants:   Pulmonology  Antimicrobials:   Anti-infectives (From admission, onward)    None          Medications  amLODipine   10  mg Oral Daily   arformoterol   15 mcg Nebulization BID   azelastine   2 spray Each Nare BID   benzonatate   200 mg Oral TID   budesonide  (PULMICORT ) nebulizer solution  0.25 mg Nebulization BID   enoxaparin  (LOVENOX ) injection  40 mg Subcutaneous Q24H   gabapentin   100 mg Oral TID   guaiFENesin   600 mg Oral BID   montelukast   10 mg Oral QHS   pantoprazole   40 mg Oral BID   potassium chloride  SA  20 mEq Oral Daily   [START ON 06/23/2023] predniSONE   30 mg Oral Q breakfast   Followed by   NOREEN ON 06/26/2023] predniSONE   20 mg Oral Q breakfast   Followed by   NOREEN ON 06/29/2023] predniSONE   10 mg Oral Q breakfast   tamoxifen   20 mg Oral Daily      Subjective:   Rebecca [name] was seen and examined today.  Feeling a whole lot  better, states had some coughing spells last night.  No fever chills, chest pain, tolerating diet.  Objective:   Vitals:   06/21/23 1927 06/22/23 0415 06/22/23 0846 06/22/23 1236  BP: 130/75 (!) 111/51  134/61  Pulse: 84 78  89  Resp: 19 20    Temp: 98 F (36.7 C) 97.8 F (36.6 C)  99 F (37.2 C)  TempSrc:    Oral  SpO2: 97% 99% 96% 98%  Weight:      Height:        Intake/Output Summary (Last 24 hours) at 06/22/2023 1427 Last data filed at 06/22/2023 0930 Gross per 24 hour  Intake 120 ml  Output --  Net 120 ml      Wt Readings from Last 3 Encounters:  06/14/23 86.7 kg  06/09/23 81.6 kg  06/04/23 84.9 kg    Physical Exam General: Alert and oriented x 3, NAD Cardiovascular: S1 S2 clear, RRR.  Respiratory: Mild scattered wheezing Gastrointestinal: Soft, nontender, nondistended, NBS Ext: no pedal edema bilaterally Neuro: no new deficits Psych: Normal affect      Data Reviewed:  I have personally reviewed following labs    CBC Lab Results  Component Value Date   WBC 17.9 (H) 06/22/2023   RBC 3.72 (L) 06/22/2023   HGB 11.6 (L) 06/22/2023   HCT 35.8 (L) 06/22/2023   MCV 96.2 06/22/2023   MCH 31.2 06/22/2023   PLT 289 06/22/2023   MCHC 32.4 06/22/2023   RDW 14.3 06/22/2023   LYMPHSABS 3.9 06/14/2023   MONOABS 0.4 06/14/2023   EOSABS 0.1 06/14/2023   BASOSABS 0.0 06/14/2023     Last metabolic panel Lab Results  Component Value Date   NA 138 06/22/2023   K 3.7 06/22/2023   CL 109 06/22/2023   CO2 20 (L) 06/22/2023   BUN 25 (H) 06/22/2023   CREATININE 0.85 06/22/2023   GLUCOSE 74 06/22/2023   GFRNONAA >60 06/22/2023   GFRAA >90 10/20/2011   CALCIUM  8.6 (L) 06/22/2023   PHOS 3.1 06/18/2023   PROT 7.8 06/09/2023   ALBUMIN 3.4 (L) 06/18/2023   BILITOT 0.4 06/09/2023   ALKPHOS 48 06/09/2023   AST 29 06/09/2023   ALT 33 06/09/2023   ANIONGAP 9 06/22/2023    CBG (last 3)  No results for input(s): GLUCAP in the last 72 hours.    Coagulation  Profile: No results for input(s): INR, PROTIME in the last 168 hours.   Radiology Studies: I have personally reviewed the imaging studies  No results found.  [name] M.D. [facility] 06/22/2023, 2:27 PM  Available via Epic secure chat 7am-7pm After 7 pm, please refer to night coverage provider listed on amion.

## 2023-06-23 ENCOUNTER — Other Ambulatory Visit (HOSPITAL_COMMUNITY): Payer: Self-pay

## 2023-06-23 DIAGNOSIS — J45901 Unspecified asthma with (acute) exacerbation: Secondary | ICD-10-CM | POA: Diagnosis not present

## 2023-06-23 DIAGNOSIS — K2100 Gastro-esophageal reflux disease with esophagitis, without bleeding: Secondary | ICD-10-CM | POA: Diagnosis not present

## 2023-06-23 DIAGNOSIS — G4733 Obstructive sleep apnea (adult) (pediatric): Secondary | ICD-10-CM | POA: Diagnosis not present

## 2023-06-23 DIAGNOSIS — J4551 Severe persistent asthma with (acute) exacerbation: Secondary | ICD-10-CM | POA: Diagnosis not present

## 2023-06-23 MED ORDER — HYDROXYZINE HCL 25 MG PO TABS
25.0000 mg | ORAL_TABLET | Freq: Two times a day (BID) | ORAL | 0 refills | Status: AC | PRN
  Filled 2023-06-23: qty 30, 15d supply, fill #0

## 2023-06-23 MED ORDER — GABAPENTIN 100 MG PO CAPS
100.0000 mg | ORAL_CAPSULE | Freq: Three times a day (TID) | ORAL | 1 refills | Status: AC
  Filled 2023-06-23: qty 90, 30d supply, fill #0

## 2023-06-23 MED ORDER — ONDANSETRON HCL 4 MG PO TABS
4.0000 mg | ORAL_TABLET | Freq: Four times a day (QID) | ORAL | 0 refills | Status: DC | PRN
  Filled 2023-06-23: qty 20, 5d supply, fill #0

## 2023-06-23 MED ORDER — GUAIFENESIN ER 600 MG PO TB12
600.0000 mg | ORAL_TABLET | Freq: Two times a day (BID) | ORAL | 0 refills | Status: DC
  Filled 2023-06-23: qty 40, 20d supply, fill #0
  Filled 2023-07-21: qty 20, 10d supply, fill #1

## 2023-06-23 MED ORDER — AZELASTINE HCL 0.1 % NA SOLN
2.0000 | Freq: Two times a day (BID) | NASAL | 12 refills | Status: AC
  Filled 2023-06-23: qty 30, 30d supply, fill #0

## 2023-06-23 MED ORDER — GUAIFENESIN-CODEINE 100-10 MG/5ML PO SOLN
5.0000 mL | Freq: Two times a day (BID) | ORAL | 0 refills | Status: DC | PRN
  Filled 2023-06-23: qty 120, 12d supply, fill #0

## 2023-06-23 MED ORDER — ARFORMOTEROL TARTRATE 15 MCG/2ML IN NEBU: 15.0000 ug | INHALATION_SOLUTION | Freq: Two times a day (BID) | RESPIRATORY_TRACT | 3 refills | Status: DC

## 2023-06-23 MED ORDER — ALBUTEROL SULFATE (2.5 MG/3ML) 0.083% IN NEBU: 2.5000 mg | INHALATION_SOLUTION | RESPIRATORY_TRACT | 12 refills | Status: DC | PRN

## 2023-06-23 MED ORDER — BUDESONIDE 0.25 MG/2ML IN SUSP
0.2500 mg | Freq: Two times a day (BID) | RESPIRATORY_TRACT | 12 refills | Status: DC
  Filled 2023-06-23: qty 60, 15d supply, fill #0

## 2023-06-23 MED ORDER — BENZONATATE 200 MG PO CAPS
200.0000 mg | ORAL_CAPSULE | Freq: Three times a day (TID) | ORAL | 1 refills | Status: DC | PRN
  Filled 2023-06-23: qty 90, 30d supply, fill #0

## 2023-06-23 MED ORDER — PREDNISONE 10 MG PO TABS
ORAL_TABLET | ORAL | 0 refills | Status: AC
  Filled 2023-06-23: qty 18, 9d supply, fill #0

## 2023-06-23 MED ORDER — PANTOPRAZOLE SODIUM 40 MG PO TBEC
40.0000 mg | DELAYED_RELEASE_TABLET | Freq: Two times a day (BID) | ORAL | 3 refills | Status: DC
  Filled 2023-06-23: qty 60, 30d supply, fill #0

## 2023-06-23 MED ORDER — ARFORMOTEROL TARTRATE 15 MCG/2ML IN NEBU
15.0000 ug | INHALATION_SOLUTION | Freq: Two times a day (BID) | RESPIRATORY_TRACT | 3 refills | Status: DC
  Filled 2023-06-23: qty 120, 30d supply, fill #0

## 2023-06-23 NOTE — Discharge Summary (Signed)
 Physician Discharge Summary   Patient: Rebecca [name] MRN: [id number] DOB: 1955/06/20  Admit date:     06/14/2023  Discharge date: 06/23/23  Discharge Physician: [name], MD    PCP: [name], MD   Recommendations at discharge:   Continue generic Pulmicort  and Brovana  twice daily as per pulmonology recommendations.   Continue Neurontin  100 mg 3 times daily  Protonix  40 mg twice daily, recommended to follow-up with GI once lung issues are resolved Continue nasal sprays daily, Tessalon  200 mg p.o. 3 times daily as needed with cough Guaifenesin -codeine  cough syrup 5 mL every 12 hours as needed for cough. Neurontin  100 mg 3 times daily Outpatient follow-up with ENT, Dr. Roark in 2 weeks Outpatient follow-up with pulmonology scheduled  Discharge Diagnoses:  Severe persistent asthma with acute asthma exacerbation Spasmatic cough episodes with likely VCD  GERD (gastroesophageal reflux disease)   OSA (obstructive sleep apnea)   Essential hypertension, benign Mild acute kidney injury resolved History of breast VA Class II obesity    Hospital Course:  Patient is a 68 year old female with asthma, breast cancer, HTN, HLD, obesity, OSA on CPAP.  And possible ILD presenting with increased shortness of breath and cough for about 3 weeks that has not improved after outpatient steroid taper, Zithromax  and breathing treatments, and admitted with acute asthma exacerbation.  Recent CT angio chest on 12/18 raise concern for early ILD.   In ED, vital stable.  No oxygen requirement but respiratory distress from severe cough and wheezing.  COVID-19, influenza and RSV PCR nonreactive.  CXR without acute finding.  Started on systemic steroid and breathing treatments.  Slowly improving but with intermittent cough fit and associated dyspnea.  PCCM consulted  Assessment and Plan:  Acute asthma exacerbation with episodic coughing spells, respiratory distress -Patient reports ongoing  symptoms for last 3 weeks despite outpatient steroid taper and breathing treatments, antibiotics.  Followed by pulmonology.  Outpatient CT on 12/18 raised concern for early ILD.  On ACE inhibitor but not taken for 5 days PTA. -RSV, flu, COVID-negative, respiratory virus panel negative -Avoid ACE inhibitor -Pulmonology was consulted, continue Neurontin  100 mg 3 times daily.   -Transitioned to p.o. prednisone  with taper, 30 mg daily for 3 days, 20 mg daily for 3 days, 10 mg daily for 3 days -MBS completed 1/3, continue regular diet.  Advised patient to follow-up with GI once acute respiratory issues are resolved.  Continue PPI twice daily -Evaluated by ENT, Dr. Roark, some VCD noted, continue twice daily PPI and outpatient follow-up -pulmonary appointment scheduled 2/26 -Per Dr Kassie, recommended to discharge on generic Pulmicort  and Brovana .  Dulera  discontinued.     Mild acute kidney injury -Creatinine 1.05 on 06/16/2023 -Resolved, creatinine 0.8 at discharge     Essential hypertension: -BP stable   Hypokalemia:  Resolved.   Leukocytosis -Likely due to steroids, improving   OSA on CPAP -Continue CPAP   History of breast cancer -Continue tamoxifen    Class II obesity Estimated body mass index is 36.1 kg/m as calculated from the following:   Height as of this encounter: 5' 1 (1.549 m).   Weight as of this encounter: 86.7 kg.      Pain control - [place]  Controlled Substance Reporting System database was reviewed. and patient was instructed, not to drive, operate heavy machinery, perform activities at heights, swimming or participation in water  activities or provide baby-sitting services while on Pain, Sleep and Anxiety Medications; until their outpatient Physician has advised to do so again.  Also recommended to not to take more than prescribed Pain, Sleep and Anxiety Medications.  Consultants: Pulmonology, ENT Procedures performed:   Disposition: Home Diet  recommendation:  Discharge Diet Orders (From admission, onward)     Start     Ordered   06/23/23 0000  Diet - low sodium heart healthy        06/23/23 1019            DISCHARGE MEDICATION: Allergies as of 06/23/2023       Reactions   Hydrocodone Hives, Shortness Of Breath, Itching   Lisinopril  Cough   Sulfa Antibiotics Hives        Medication List     STOP taking these medications    lisinopril  40 MG tablet Commonly known as: ZESTRIL        TAKE these medications    albuterol  108 (90 Base) MCG/ACT inhaler Commonly known as: Ventolin  HFA Use 2 puffs every 4 hours as needed for cough or wheeze.  May use  2 puffs 10-20 minutes prior to exercise. What changed:  how much to take how to take this when to take this reasons to take this additional instructions   albuterol  (2.5 MG/3ML) 0.083% nebulizer solution Commonly known as: PROVENTIL  Take 3 mLs (2.5 mg total) by nebulization every 4 (four) hours as needed for wheezing or shortness of breath. What changed: Another medication with the same name was changed. Make sure you understand how and when to take each.   arformoterol  15 MCG/2ML Nebu Commonly known as: BROVANA  Inhale 2 mLs (15 mcg total) by nebulization 2 (two) times daily.   Azelastine  HCl 137 MCG/SPRAY Soln Place 2 sprays into both nostrils 2 (two) times daily. Use in each nostril as directed   benzonatate  200 MG capsule Commonly known as: TESSALON  Take 1 capsule (200 mg total) by mouth 3 (three) times daily as needed. What changed: reasons to take this   budesonide  0.25 MG/2ML nebulizer solution Commonly known as: PULMICORT  Take 2 mLs (0.25 mg total) by nebulization 2 (two) times daily. generic   gabapentin  100 MG capsule Commonly known as: NEURONTIN  Take 1 capsule (100 mg total) by mouth 3 (three) times daily.   guaiFENesin  600 MG 12 hr tablet Commonly known as: MUCINEX  Take 1 tablet (600 mg total) by mouth 2 (two) times daily.    guaiFENesin -codeine  100-10 MG/5ML syrup Take 5 mLs by mouth every 12 (twelve) hours as needed for cough.   hydrochlorothiazide  12.5 MG tablet Commonly known as: HYDRODIURIL  Take 1 tablet (12.5 mg total) by mouth daily.   hydrOXYzine  25 MG tablet Commonly known as: ATARAX  Take 1 tablet (25 mg total) by mouth 2 (two) times daily as needed for anxiety.   ibuprofen 800 MG tablet Commonly known as: ADVIL Take 800 mg by mouth 2 (two) times daily as needed (for pain).   montelukast  10 MG tablet Commonly known as: Singulair  Take 1 tablet (10 mg total) by mouth at bedtime.   ondansetron  4 MG tablet Commonly known as: ZOFRAN  Take 1 tablet (4 mg total) by mouth every 6 (six) hours as needed for nausea or vomiting.   pantoprazole  40 MG tablet Commonly known as: PROTONIX  Take 1 tablet (40 mg total) by mouth 2 (two) times daily before a meal.   potassium chloride  SA 20 MEQ tablet Commonly known as: KLOR-CON  M Take 1 tablet (20 mEq total) by mouth daily.   predniSONE  10 MG tablet Commonly known as: DELTASONE  Take 3 tablets (30 mg total) by mouth daily with  breakfast for 3 days, THEN 2 tablets (20 mg total) daily with breakfast for 3 days, THEN 1 tablet (10 mg total) daily with breakfast for 3 days. Start taking on: June 24, 2023   PRESCRIPTION MEDICATION See admin instructions. CPAP- At bedtime   tamoxifen  20 MG tablet Commonly known as: NOLVADEX  Take 20 mg by mouth daily.   Vitamin D  (Ergocalciferol ) 1.25 MG (50000 UNIT) Caps capsule Commonly known as: DRISDOL  Take 1 capsule (50,000 Units total) by mouth every 7 (seven) days. What changed: when to take this        Follow-up Information     Geronimo Amel, MD Follow up on 08/11/2023.   Specialty: Pulmonary Disease Why: at 3:45 PM, for hospital follow-up Contact information: [address] [place] [address] [phone]         [name], MD. Schedule an appointment as soon as possible for a  visit in 2 week(s).   Specialty: Family Medicine Why: for hospital follow-up Contact information: [address] [place] [facility] [place] [address] [phone]                Discharge Exam: Fredricka Weights   06/14/23 1258  Weight: 86.7 kg   S: Feeling better today, looking forward to go home today.  No fevers or chills, cough and wheezing has significantly improved.  BP 131/70 (BP Location: Left Arm)   Pulse 75   Temp 98.7 F (37.1 C)   Resp 17   Ht 5' 1 (1.549 m)   Wt 86.7 kg   SpO2 97%   BMI 36.10 kg/m   Physical Exam General: Alert and oriented x 3, NAD Cardiovascular: S1 S2 clear, RRR.  Respiratory: CTAB, no wheezing Gastrointestinal: Soft, nontender, nondistended, NBS Ext: no pedal edema bilaterally Neuro: no new deficits Psych: Normal affect    Condition at discharge: fair  The results of significant diagnostics from this hospitalization (including imaging, microbiology, ancillary and laboratory) are listed below for reference.   Imaging Studies: DG Swallowing Func-Speech Pathology Result Date: 06/18/2023 Table formatting from the original result was not included. Modified Barium Swallow Study Patient Details Name: SOLANGE EMRY MRN: [id number] Date of Birth: 12/01/1955 Today's Date: 06/18/2023 HPI/PMH: HPI: Patient is a 68 year old female admitted to [facility] with asthma exacerbation, episodic coughing fits with severe dyspnea.  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  Concern was present for potential neck abscess but imaging was negative.  CT of neck did show diffuse idiopathic skeletal hyperostosis with prominent anterior osteophytes.  Swallow evaluation ordered.  SLP messaged MD requesting difference of clinical swallow evaluation given patient overtly coughing and choking with liquids and  her cervical spine findings.  Obtain approval for MBS study.  Prior upper GI study was normal in 2013 patient has had prior lap band surgery. Clinical Impression: Clinical Impression: Patient with functional oral pharyngeal swallow ability without aspiration or penetration of any consistency tested. Motility was normal and there was no oropharyngeal retention related to her DISH.  Swallow was strong and timely. Patient was noted during evaluation to start overtly coughing after swallow of thin without barium visualized in larynx or trachea.  Suspect this is potentially a reflux cough given she has history of reflux.  She reports she does not take a PPI prior to admission but currently is on PPI twice daily and SLP advised she follow a  reflux prudent diet.  Of note after patient swallowed barium tablet upon esophageal sweep she appeared with barium tablet and thin barium momentarily halting at distal esophagus with sensation to pharynx. Delayed dry swallow effectively cleared esophagus.  Patient questioned why coughing causes her to stop breathing, given history of reflux SLP questions if symptoms are consistent with laryngospasm associated with reflux.       SLP questions if pt may have esophageal web - that did not impair barium tablet flow.  Patient educated to findings of MBS using fluoroscopy loops for details and she reports understanding.  Factors that may increase risk of adverse event in presence of aspiration Noe & Lianne 2021): No data recorded Recommendations/Plan: Swallowing Evaluation Recommendations Swallowing Evaluation Recommendations Recommendations: PO diet PO Diet Recommendation: Regular; Thin liquids (Level 0) Liquid Administration via: Cup; Straw Medication Administration: Whole meds with liquid Supervision: Patient able to self-feed Swallowing strategies  : Slow rate; Small bites/sips; Follow solids with liquids; Multiple dry swallows after each bite/sip Postural changes: Position pt fully  upright for meals; Stay upright 30-60 min after meals Oral care recommendations: Oral care BID (2x/day) Treatment Plan Treatment Plan Treatment recommendations: No treatment recommended at this time Recommendations Recommendations for follow up therapy are one component of a multi-disciplinary discharge planning process, led by the attending physician.  Recommendations may be updated based on patient status, additional functional criteria and insurance authorization. Assessment: Orofacial Exam: Orofacial Exam Oral Cavity: Oral Hygiene: WFL Oral Cavity - Dentition: Adequate natural dentition Orofacial Anatomy: WFL Oral Motor/Sensory Function: WFL Anatomy: Anatomy: Suspected cervical osteophytes Boluses Administered: Boluses Administered Boluses Administered: Thin liquids (Level 0); Mildly thick liquids (Level 2, nectar thick); Puree; Solid  Oral Impairment Domain: Oral Impairment Domain Lip Closure: No labial escape Tongue control during bolus hold: Cohesive bolus between tongue to palatal seal Bolus preparation/mastication: Timely and efficient chewing and mashing Bolus transport/lingual motion: Brisk tongue motion Oral residue: Complete oral clearance Location of oral residue : N/A Initiation of pharyngeal swallow : Valleculae; Pyriform sinuses  Pharyngeal Impairment Domain: Pharyngeal Impairment Domain Soft palate elevation: No bolus between soft palate (SP)/pharyngeal wall (PW) Laryngeal elevation: Complete superior movement of thyroid  cartilage with complete approximation of arytenoids to epiglottic petiole Anterior hyoid excursion: Complete anterior movement Epiglottic movement: Complete inversion Laryngeal vestibule closure: Complete, no air/contrast in laryngeal vestibule Pharyngeal stripping wave : Present - complete Pharyngeal contraction (A/P view only): N/A Pharyngoesophageal segment opening: Complete distension and complete duration, no obstruction of flow Tongue base retraction: No contrast between  tongue base and posterior pharyngeal wall (PPW) Pharyngeal residue: Complete pharyngeal clearance  Esophageal Impairment Domain: Esophageal Impairment Domain Esophageal clearance upright position: Esophageal retention with retrograde flow below pharyngoesophageal segment (PES) Pill: Pill Consistency administered: Thin liquids (Level 0) Thin liquids (Level 0): [facility] Penetration/Aspiration Scale Score: Penetration/Aspiration Scale Score 1.  Material does not enter airway: Thin liquids (Level 0); Mildly thick liquids (Level 2, nectar thick); Puree; Solid; Pill Compensatory Strategies: Compensatory Strategies Compensatory strategies: No   General Information: No data recorded Diet Prior to this Study: Regular; Thin liquids (Level 0)   Temperature : Normal   Respiratory Status: WFL   Supplemental O2: None (Room air)   History of Recent Intubation: No  Behavior/Cognition: Alert; Cooperative; Pleasant mood Self-Feeding Abilities: Able to self-feed Baseline vocal quality/speech: Normal Volitional Cough: Able to elicit Volitional Swallow: Able to elicit Exam Limitations: No limitations Goal Planning: No data recorded No data recorded No data recorded Patient/Family Stated Goal: to get better- pt being given  Gabapentin  by pulmonary per her statement - No data recorded Pain: Pain Assessment Pain Assessment: No/denies pain End of Session: Start Time:SLP Start Time (ACUTE ONLY): 1455 Stop Time: SLP Stop Time (ACUTE ONLY): 1512 Time Calculation:SLP Time Calculation (min) (ACUTE ONLY): 17 min Charges: SLP Evaluations $ SLP Speech Visit: 1 Visit SLP Evaluations $MBS Swallow: 1 Procedure $Swallowing Treatment: 1 Procedure SLP visit diagnosis: SLP Visit Diagnosis: Dysphagia, unspecified (R13.10) Past Medical History: Past Medical History: Diagnosis Date  Arthritis   Asthma   Blindness, legal   L EYE  Breast cancer (HCC)   GERD (gastroesophageal reflux disease)   Hearing loss   Hyperlipidemia   Hypertension   Lapband May 2013 11/18/2011   Migraines   Obesity, Class III, BMI 40-49.9 (morbid obesity) (HCC) 06/03/2011  Pre-diabetes   Sleep apnea   uses c-pap  Vertigo  Past Surgical History: Past Surgical History: Procedure Laterality Date  ABDOMINAL HYSTERECTOMY  2006  fibroids  BREAST LUMPECTOMY WITH RADIOACTIVE SEED LOCALIZATION Left 04/21/2022  Procedure: LEFT BREAST LUMPECTOMY WITH RADIOACTIVE SEED LOCALIZATION;  Surgeon: Belinda Cough, MD;  Location: [facility];  Service: General;  Laterality: Left;  BREATH TEK H PYLORI  08/17/2011  Procedure: BREATH TEK H PYLORI;  Surgeon: Cough KATHEE Lunger, MD;  Location: [facility];  Service: General;  Laterality: N/A;  PATIENT WILL COME AT 0715  COLONOSCOPY  2008  @ [facility]   EYE SURGERY    Patient unsure of surgery date. Left eye  KNEE SURGERY Right 1992  right knee arthroscopy  LAPAROSCOPIC GASTRIC BANDING  10/20/2011  Procedure: LAPAROSCOPIC GASTRIC BANDING;  Surgeon: Cough KATHEE Lunger, MD;  Location: [facility];  Service: General;  Laterality: N/A;  RE-EXCISION OF BREAST LUMPECTOMY Left 05/05/2022  Procedure: RE-EXCISION OF LEFT BREAST LUMPECTOMY SITE POSTERIOR MARGIN;  Surgeon: Belinda Cough, MD;  Location: [facility];  Service: General;  Laterality: Left;  TOTAL KNEE ARTHROPLASTY Right 08/18/2021  Procedure: TOTAL KNEE ARTHROPLASTY;  Surgeon: Melodi Lerner, MD;  Location: [facility];  Service: Orthopedics;  Laterality: Right;  TOTAL KNEE REVISION Right 12/28/2022  Procedure: Right knee femoral versus total knee arthroplasty revision;  Surgeon: Melodi Lerner, MD;  Location: [facility];  Service: Orthopedics;  Laterality: Right; [name], MS [facility] SLP Acute Rehab Services Office [phone] [name] 06/18/2023, 4:03 PM  CT SOFT TISSUE NECK W CONTRAST Result Date: 06/17/2023 CLINICAL DATA:  Soft tissue infection suspected, neck, xray done. EXAM: CT NECK WITH CONTRAST TECHNIQUE: Multidetector CT imaging of the neck was performed using the standard protocol following the bolus  administration of intravenous contrast. RADIATION DOSE REDUCTION: This exam was performed according to the departmental dose-optimization program which includes automated exposure control, adjustment of the mA and/or kV according to patient size and/or use of iterative reconstruction technique. CONTRAST:  75mL OMNIPAQUE  IOHEXOL  300 MG/ML  SOLN COMPARISON:  None Available. FINDINGS: Pharynx and larynx: Normal. No mass or swelling. Salivary glands: No inflammation, mass, or stone. Thyroid : Normal. Lymph nodes: No suspicious cervical lymphadenopathy. Vascular: Atherosclerotic calcifications of the aortic arch. Limited intracranial: Unremarkable. Visualized orbits: Unremarkable. Mastoids and visualized paranasal sinuses: Well aerated. Skeleton: Diffuse idiopathic skeletal hyperostosis of the cervical spine with prominent anterior osteophytes. Upper chest: No acute findings. Other: No mass or fluid collection in the neck. IMPRESSION: 1. No mass or fluid collection in the neck. 2. Diffuse idiopathic skeletal hyperostosis of the cervical spine with prominent anterior osteophytes. Aortic Atherosclerosis (ICD10-I70.0). Electronically Signed   By: Ryan Chess M.D.   On: 06/17/2023 14:22  DG Chest 2 View Result Date: 06/14/2023 CLINICAL DATA:  Shortness of breath. EXAM: CHEST - 2 VIEW COMPARISON:  06/09/2023. FINDINGS: There is homogeneous opacity overlying the left lateral costophrenic angle, which corresponds to prominent epicardial fat pad. Bilateral lung fields are otherwise clear. Bilateral costophrenic angles are clear. Stable cardio-mediastinal silhouette. No acute osseous abnormalities. The soft tissues are within normal limits. IMPRESSION: No active cardiopulmonary disease. Electronically Signed   By: Ree Molt M.D.   On: 06/14/2023 14:39   DG Chest 2 View Result Date: 06/09/2023 CLINICAL DATA:  SOB EXAM: CHEST - 2 VIEW COMPARISON:  06/02/2023. FINDINGS: The heart size and mediastinal contours are  within normal limits. Both lungs are clear. No pneumothorax or pleural effusion. Aorta is calcified. There are thoracic degenerative changes. Gaseous distention of the stomach noted. There is a lap band. IMPRESSION: No acute cardiopulmonary disease. Electronically Signed   By: Fonda Field M.D.   On: 06/09/2023 12:04   CT Angio Chest PE W and/or Wo Contrast Result Date: 06/02/2023 CLINICAL DATA:  Possible PE. Persistent cough and chest pain for 5 days. Positive D-dimer. EXAM: CT ANGIOGRAPHY CHEST WITH CONTRAST TECHNIQUE: Multidetector CT imaging of the chest was performed using the standard protocol during bolus administration of intravenous contrast. Multiplanar CT image reconstructions and MIPs were obtained to evaluate the vascular anatomy. RADIATION DOSE REDUCTION: This exam was performed according to the departmental dose-optimization program which includes automated exposure control, adjustment of the mA and/or kV according to patient size and/or use of iterative reconstruction technique. CONTRAST:  80mL OMNIPAQUE  IOHEXOL  350 MG/ML SOLN COMPARISON:  Radiograph 06/02/2023 FINDINGS: Cardiovascular: Negative for acute pulmonary embolism. No pericardial effusion. Coronary artery and aortic atherosclerotic calcification Mediastinum/Nodes: Trachea and esophagus are unremarkable. No adenopathy. Lungs/Pleura: Subpleural reticular and ground-glass opacities suggesting interstitial lung disease. Benign cyst in the right lower lobe. No focal consolidation, pleural effusion, or pneumothorax. Upper Abdomen: No acute abnormality. Musculoskeletal: Advanced arthritis right shoulder. No acute fracture. Multilevel bridging anterior osteophytes in the thoracic spine. Review of the MIP images confirms the above findings. IMPRESSION: 1. Negative for acute pulmonary embolism. 2. Subpleural reticular and ground-glass opacities suggesting early/mild interstitial lung disease. Aortic Atherosclerosis (ICD10-I70.0).  Electronically Signed   By: Norman Gatlin M.D.   On: 06/02/2023 21:51   DG Chest 2 View Result Date: 06/02/2023 CLINICAL DATA:  Chest pain and cough. EXAM: CHEST - 2 VIEW COMPARISON:  02/04/2022 FINDINGS: The lungs are clear without focal pneumonia, edema, pneumothorax or pleural effusion. Subtle nodular densities project over the peripheral right upper lobe. The cardiopericardial silhouette is within normal limits for size. No acute bony abnormality. IMPRESSION: 1. No acute cardiopulmonary findings. 2. Subtle nodular densities project over the peripheral right upper lobe. CT chest without contrast recommended to further evaluate. These results will be called to the ordering clinician or representative by the Radiologist Assistant, and communication documented in the PACS or [employer]. Electronically Signed   By: Camellia Candle M.D.   On: 06/02/2023 14:16    Microbiology: Results for orders placed or performed during the hospital encounter of 06/14/23  Resp panel by RT-PCR (RSV, Flu A&B, Covid) Anterior Nasal Swab     Status: None   Collection Time: 06/14/23  1:12 PM   Specimen: Anterior Nasal Swab  Result Value Ref Range Status   SARS Coronavirus 2 by RT PCR NEGATIVE NEGATIVE Final    Comment: (NOTE) SARS-CoV-2 target nucleic acids are NOT DETECTED.  The SARS-CoV-2 RNA is generally detectable in upper respiratory specimens  during the acute phase of infection. The lowest concentration of SARS-CoV-2 viral copies this assay can detect is 138 copies/mL. A negative result does not preclude SARS-Cov-2 infection and should not be used as the sole basis for treatment or other patient management decisions. A negative result may occur with  improper specimen collection/handling, submission of specimen other than nasopharyngeal swab, presence of viral mutation(s) within the areas targeted by this assay, and inadequate number of viral copies(<138 copies/mL). A negative result must be combined  with clinical observations, patient history, and epidemiological information. The expected result is Negative.  Fact Sheet for Patients:  bloggercourse.com  Fact Sheet for Healthcare Providers:  seriousbroker.it  This test is no t yet approved or cleared by the [place]  FDA and  has been authorized for detection and/or diagnosis of SARS-CoV-2 by FDA under an Emergency Use Authorization (EUA). This EUA will remain  in effect (meaning this test can be used) for the duration of the COVID-19 declaration under Section 564(b)(1) of the Act, 21 U.S.C.section 360bbb-3(b)(1), unless the authorization is terminated  or revoked sooner.       Influenza A by PCR NEGATIVE NEGATIVE Final   Influenza B by PCR NEGATIVE NEGATIVE Final    Comment: (NOTE) The Xpert Xpress SARS-CoV-2/FLU/RSV plus assay is intended as an aid in the diagnosis of influenza from Nasopharyngeal swab specimens and should not be used as a sole basis for treatment. Nasal washings and aspirates are unacceptable for Xpert Xpress SARS-CoV-2/FLU/RSV testing.  Fact Sheet for Patients: bloggercourse.com  Fact Sheet for Healthcare Providers: seriousbroker.it  This test is not yet approved or cleared by the [place]  FDA and has been authorized for detection and/or diagnosis of SARS-CoV-2 by FDA under an Emergency Use Authorization (EUA). This EUA will remain in effect (meaning this test can be used) for the duration of the COVID-19 declaration under Section 564(b)(1) of the Act, 21 U.S.C. section 360bbb-3(b)(1), unless the authorization is terminated or revoked.     Resp Syncytial Virus by PCR NEGATIVE NEGATIVE Final    Comment: (NOTE) Fact Sheet for Patients: bloggercourse.com  Fact Sheet for Healthcare Providers: seriousbroker.it  This test is not yet approved  or cleared by the [place]  FDA and has been authorized for detection and/or diagnosis of SARS-CoV-2 by FDA under an Emergency Use Authorization (EUA). This EUA will remain in effect (meaning this test can be used) for the duration of the COVID-19 declaration under Section 564(b)(1) of the Act, 21 U.S.C. section 360bbb-3(b)(1), unless the authorization is terminated or revoked.  Performed at [facility], [address] [place], [place] [address]   Respiratory (~20 pathogens) panel by PCR     Status: None   Collection Time: 06/14/23  7:14 PM   Specimen: Nasopharyngeal Swab; Respiratory  Result Value Ref Range Status   Adenovirus NOT DETECTED NOT DETECTED Final   Coronavirus 229E NOT DETECTED NOT DETECTED Final    Comment: (NOTE) The Coronavirus on the Respiratory Panel, DOES NOT test for the novel  Coronavirus (2019 nCoV)    Coronavirus HKU1 NOT DETECTED NOT DETECTED Final   Coronavirus NL63 NOT DETECTED NOT DETECTED Final   Coronavirus OC43 NOT DETECTED NOT DETECTED Final   Metapneumovirus NOT DETECTED NOT DETECTED Final   Rhinovirus / Enterovirus NOT DETECTED NOT DETECTED Final   Influenza A NOT DETECTED NOT DETECTED Final   Influenza B NOT DETECTED NOT DETECTED Final   Parainfluenza Virus 1 NOT DETECTED NOT DETECTED Final   Parainfluenza Virus 2 NOT  DETECTED NOT DETECTED Final   Parainfluenza Virus 3 NOT DETECTED NOT DETECTED Final   Parainfluenza Virus 4 NOT DETECTED NOT DETECTED Final   Respiratory Syncytial Virus NOT DETECTED NOT DETECTED Final   Bordetella pertussis NOT DETECTED NOT DETECTED Final   Bordetella Parapertussis NOT DETECTED NOT DETECTED Final   Chlamydophila pneumoniae NOT DETECTED NOT DETECTED Final   Mycoplasma pneumoniae NOT DETECTED NOT DETECTED Final    Comment: Performed at [facility], [address], [place], [place] [address]  Expectorated Sputum Assessment w Gram Stain, Rflx to Resp Cult     Status: None   Collection  Time: 06/15/23 10:45 PM   Specimen: Expectorated Sputum  Result Value Ref Range Status   Specimen Description EXPECTORATED SPUTUM  Final   Special Requests NONE  Final   Sputum evaluation   Final    THIS SPECIMEN IS ACCEPTABLE FOR SPUTUM CULTURE Performed at [facility], [address] [place], [place] [address]    Report Status 06/15/2023 FINAL  Final  Culture, Respiratory w Gram Stain     Status: None   Collection Time: 06/15/23 10:45 PM  Result Value Ref Range Status   Specimen Description   Final    EXPECTORATED SPUTUM Performed at [facility], [address], [place], [place] [address]    Special Requests   Final    NONE Reflexed from [id number] Performed at [facility], [address], [place], [place] [address]    Gram Stain   Final    RARE WBC PRESENT, PREDOMINANTLY PMN FEW GRAM POSITIVE COCCI FEW GRAM NEGATIVE RODS    Culture   Final    MODERATE Normal respiratory flora-no Staph aureus or Pseudomonas seen Performed at [facility], [address], [place], [place] [address]    Report Status 06/18/2023 FINAL  Final    Labs: CBC: Recent Labs  Lab 06/18/23 0536 06/22/23 0653  WBC 18.6* 17.9*  HGB 11.7* 11.6*  HCT 34.8* 35.8*  MCV 95.3 96.2  PLT 334 289   Basic Metabolic Panel: Recent Labs  Lab 06/18/23 0536 06/22/23 0653  NA 141 138  K 4.1 3.7  CL 110 109  CO2 22 20*  GLUCOSE 77 74  BUN 27* 25*  CREATININE 0.76 0.85  CALCIUM  8.8* 8.6*  MG 2.6*  --   PHOS 3.1  --    Liver Function Tests: Recent Labs  Lab 06/18/23 0536  ALBUMIN 3.4*   CBG: No results for input(s): GLUCAP in the last 168 hours.  Discharge time spent: greater than 30 minutes.  Signed: [name], MD [facility] 06/23/2023

## 2023-06-23 NOTE — Plan of Care (Signed)
  Problem: Clinical Measurements: Goal: Diagnostic test results will improve Outcome: Progressing   Problem: Nutrition: Goal: Adequate nutrition will be maintained Outcome: Progressing   Problem: Skin Integrity: Goal: Risk for impaired skin integrity will decrease Outcome: Progressing   

## 2023-06-23 NOTE — Care Management Important Message (Signed)
 Important Message  Patient Details IM Letter given. Name: Rebecca [name] MRN: [id number] Date of Birth: 1956-03-26   Important Message Given:  Yes - Medicare IM     Caren Macadam 06/23/2023, 11:43 AM

## 2023-06-23 NOTE — Progress Notes (Signed)
 Mobility Specialist - Progress Note   06/23/23 0930  Mobility  Activity Ambulated with assistance in hallway  Level of Assistance Independent after set-up  Assistive Device None  Distance Ambulated (ft) 200 ft  Range of Motion/Exercises Active  Activity Response Tolerated well  Mobility Referral Yes  Mobility visit 1 Mobility  Mobility Specialist Start Time (ACUTE ONLY) 0857  Mobility Specialist Stop Time (ACUTE ONLY) 0905  Mobility Specialist Time Calculation (min) (ACUTE ONLY) 8 min   Received in bed and agreed to mobility, had no issues throughout session. Returned to bed with all needs met.  Cyndee Ada Mobility Specialist

## 2023-06-24 ENCOUNTER — Telehealth: Payer: Self-pay | Admitting: Allergy

## 2023-06-24 ENCOUNTER — Telehealth: Payer: Self-pay

## 2023-06-24 NOTE — Transitions of Care (Post Inpatient/ED Visit) (Signed)
 06/24/2023  Name: Rebecca [name] MRN: [id number] DOB: Oct 25, 1955  Today's TOC FU Call Status: Today's TOC FU Call Status:: Successful TOC FU Call Completed TOC FU Call Complete Date: 06/24/23 Patient's Name and Date of Birth confirmed.  Transition Care Management Follow-up Telephone Call Date of Discharge: 06/23/23 Discharge Facility: [facility] Type of Discharge: Inpatient Admission Primary Inpatient Discharge Diagnosis:: asthma How have you been since you were released from the hospital?: Better Any questions or concerns?: No  Items Reviewed: Did you receive and understand the discharge instructions provided?: Yes Medications obtained,verified, and reconciled?: Yes (Medications Reviewed) Any new allergies since your discharge?: No Dietary orders reviewed?: Yes Do you have support at home?: Yes People in Home: child(ren), adult  Medications Reviewed Today: Medications Reviewed Today     Reviewed by Emmitt Pan, LPN (Licensed Practical Nurse) on 06/24/23 at 1042  Med List Status: <None>   Medication Order Taking? Sig Documenting Provider Last Dose Status Informant  albuterol  (PROVENTIL ) (2.5 MG/3ML) 0.083% nebulizer solution [id number] No Take 3 mLs (2.5 mg total) by nebulization every 4 (four) hours as needed for wheezing or shortness of breath. Jeneal Danita Macintosh, MD 06/13/2023 Active Self  albuterol  (VENTOLIN  HFA) 108 (90 Base) MCG/ACT inhaler [id number] No Use 2 puffs every 4 hours as needed for cough or wheeze.  May use  2 puffs 10-20 minutes prior to exercise.  Patient taking differently: Inhale 2 puffs into the lungs every 4 (four) hours as needed for wheezing or shortness of breath (or coughing and may also use 2 puffs 10 to 20 minutes prior to exercise).   Cheryl Reusing, FNP 06/14/2023 Active Self  arformoterol  (BROVANA ) 15 MCG/2ML NEBU [id number]  Inhale 2 mLs (15 mcg total) by nebulization 2 (two) times daily. Rai, Nydia POUR, MD  Active   azelastine   (ASTELIN ) 0.1 % nasal spray [id number]  Place 2 sprays into both nostrils 2 (two) times daily. Use in each nostril as directed Rai, Ripudeep K, MD  Active   benzonatate  (TESSALON ) 200 MG capsule [id number]  Take 1 capsule (200 mg total) by mouth 3 (three) times daily as needed. Rai, Nydia POUR, MD  Active   budesonide  (PULMICORT ) 0.25 MG/2ML nebulizer solution [id number]  Take 2 mLs (0.25 mg total) by nebulization 2 (two) times daily. generic Rai, Nydia POUR, MD  Active   gabapentin  (NEURONTIN ) 100 MG capsule [id number]  Take 1 capsule (100 mg total) by mouth 3 (three) times daily. Rai, Nydia POUR, MD  Active   guaiFENesin  (MUCINEX ) 600 MG 12 hr tablet [id number]  Take 1 tablet (600 mg total) by mouth 2 (two) times daily. Rai, Nydia POUR, MD  Active   guaiFENesin -codeine  100-10 MG/5ML syrup [id number]  Take 5 mLs by mouth every 12 (twelve) hours as needed for cough. Rai, Nydia POUR, MD  Active   hydrochlorothiazide  (HYDRODIURIL ) 12.5 MG tablet [id number] No Take 1 tablet (12.5 mg total) by mouth daily. [name], MD 06/13/2023 Active Self  hydrOXYzine  (ATARAX ) 25 MG tablet [id number]  Take 1 tablet (25 mg total) by mouth 2 (two) times daily as needed for anxiety. Rai, Nydia POUR, MD  Active   ibuprofen (ADVIL) 800 MG tablet [id number] No Take 800 mg by mouth 2 (two) times daily as needed (for pain). [provider] Past Month Active Self  montelukast  (SINGULAIR ) 10 MG tablet [id number] No Take 1 tablet (10 mg total) by mouth at bedtime. Cheryl Reusing, FNP 06/13/2023 Active Self  ondansetron  (ZOFRAN ) 4 MG tablet [id number]  Take 1  tablet (4 mg total) by mouth every 6 (six) hours as needed for nausea or vomiting. Rai, Nydia POUR, MD  Active   pantoprazole  (PROTONIX ) 40 MG tablet [id number]  Take 1 tablet (40 mg total) by mouth 2 (two) times daily before a meal. Rai, Ripudeep K, MD  Active   potassium chloride  SA (KLOR-CON  M) 20 MEQ tablet [id number] No Take 1 tablet (20 mEq total) by mouth daily.  [name], MD 06/14/2023 Active Self  predniSONE  (DELTASONE ) 10 MG tablet [id number]  Take 3 tablets (30 mg total) by mouth daily with breakfast for 3 days, THEN 2 tablets (20 mg total) daily with breakfast for 3 days, THEN 1 tablet (10 mg total) daily with breakfast for 3 days. Davia Nydia POUR, MD  Active   PRESCRIPTION MEDICATION [id number] No See admin instructions. CPAP- At bedtime [provider] 06/13/2023 Bedtime Active Self  tamoxifen  (NOLVADEX ) 20 MG tablet [id number] No Take 20 mg by mouth daily. [provider] 06/03/2023 Active Self  Vitamin D , Ergocalciferol , (DRISDOL ) 1.25 MG (50000 UNIT) CAPS capsule [id number] No Take 1 capsule (50,000 Units total) by mouth every 7 (seven) days.  Patient taking differently: Take 50,000 Units by mouth every Saturday.   [name], MD 06/12/2023 Active Self  Med List Note Claud Micheal DASEN, CPhT 12/21/22 1005): CPAP AT NIGHT. Pt requests for all medications be [facility] [place] [address]            Home Care and Equipment/Supplies: Were Home Health Services Ordered?: NA Any new equipment or medical supplies ordered?: NA  Functional Questionnaire: Do you need assistance with bathing/showering or dressing?: No Do you need assistance with meal preparation?: No Do you need assistance with eating?: No Do you have difficulty maintaining continence: No Do you need assistance with getting out of bed/getting out of a chair/moving?: No Do you have difficulty managing or taking your medications?: No  Follow up appointments reviewed: PCP Follow-up appointment confirmed?: Yes Date of PCP follow-up appointment?: 07/01/23 Follow-up Provider: [facility] Follow-up appointment confirmed?: Yes Date of Specialist follow-up appointment?: 08/11/23 Follow-Up Specialty Provider:: pulmo Do you need transportation to your follow-up appointment?: No Do you understand care options if your condition(s)  worsen?: Yes-patient verbalized understanding    SIGNATURE Julian Lemmings, LPN [facility] Nurse Health Advisor Direct Dial [phone]

## 2023-06-24 NOTE — Telephone Encounter (Signed)
 Called patient back - DOB verified - advised to come by office - [address] - to p/u new nebulizer kit.  Patient verbalized understanding - will come to [facility] office today to p/u neb kit.

## 2023-06-24 NOTE — Telephone Encounter (Signed)
 Patient called stating she has been in the hospital for the last 10 days and the hospital prescribed her Nebulizer medication. Patient states she needs [facility]izer mask [facility] [address] in [place].

## 2023-07-01 ENCOUNTER — Ambulatory Visit: Payer: Self-pay | Admitting: Family Medicine

## 2023-07-01 ENCOUNTER — Telehealth: Payer: Self-pay | Admitting: Allergy

## 2023-07-01 ENCOUNTER — Inpatient Hospital Stay: Payer: 59 | Admitting: Family Medicine

## 2023-07-01 NOTE — Telephone Encounter (Signed)
Copied from CRM [id number]. Topic: Clinical - Red Word Triage >> Jul 01, 2023  2:29 PM Larwance Sachs wrote: Patient/patient representative is calling in to reschedule hospital follow up appointment for today that was a no show. Patient states symptoms are coughing- cant breathe during coughing, choking/ feels like throat is closing in and headaches   Chief Complaint: trouble breathing, chest pain Symptoms: trouble breathing, chest pain, neck pain, arm pain, rib pain, cough, "feels like throat's been closing in on me," gasping at night, fatigue "exhausted, depleted" Frequency: comes and goes Pertinent Negatives: Patient denies SOB at this time, coughing up blood Disposition: [] 911 / [] ED /[] Urgent Care (no appt availability in office) / [] Appointment(In office/virtual)/ []  [place] [facility]/ [] Home Care/ [x] Refused Recommended Disposition /[] [place] [facility]/ []  Follow-up with PCP Additional Notes: Pt confirms trouble breathing that comes and goes, pt mimicking her own "gasp gasp" sounds when breathe, "scaring my family to death, have to shake myself or get up and move until it passes," confirms intermittent not currently SOB. Pt reporting was in hospital for 2 weeks and got discharged last Wednesday but "not getting any better, chest has started hurting I guess from coughing I don't know but when I cough feels like my throat or vocal cords inside my throat are closing up, need to see specialist something most definitely wrong, when cough I can't breathe." Pt reporting chest pain "5 or under, probably 4" out of 10, reporting also pain "up in neck sometimes, not unbearable but annoying," neck pain "any time it feels like it" including right now to "left side" of neck. Pt reporting no chest pain right now, but "sometimes pain to rib cage and arm, feels like cramp." Pt now confirms pain to "left side chest right now and shoulder blade in front," "not in back, cramp in left side back all this week, been  trying to tell you people." Pt confirms symptoms with chest pain been coming and going more often and getting worse. Pt reporting "more concerned" with her breathing, "coughing got me waking up gasping" though been "taking all my medicines" from hospital and breathing treatments only "help most of the time but sometimes creates more coughing." Pt reporting that sometimes "not finishing" the nebulizer meds "because keep coughing." Advised pt go to ED for symptoms. Pt refusing "can't do it today, ma'am, too tired" but confirms will go to ED "if get worse." Nurse advised that nurse would send HP message to office for further recommendations, advised that current recommendation is to go to ED.  Reason for Disposition  [1] Chest pain (or "angina") comes and goes AND [2] is happening more often (increasing in frequency) or getting worse (increasing in severity)  (Exception: Chest pains that last only a few seconds.)  Answer Assessment - Initial Assessment Questions 1. RESPIRATORY STATUS: "Describe your breathing?" (e.g., wheezing, shortness of breath, unable to speak, severe coughing)      Pt mimicking her own "gasp gasp" sounds when breathe, scaring my family to death, have to shake myself or get up and move until it passes, intermittent not currently 2. ONSET: "When did this breathing problem begin?"      2 weeks ago 3. PATTERN "Does the difficult breathing come and go, or has it been constant since it started?"      Comes and goes 4. SEVERITY: "How bad is your breathing?" (e.g., mild, moderate, severe)    - MILD: No SOB at rest, mild SOB with walking, speaks normally in sentences,  can lie down, no retractions, pulse < 100.    - MODERATE: SOB at rest, SOB with minimal exertion and prefers to sit, cannot lie down flat, speaks in phrases, mild retractions, audible wheezing, pulse 100-120.    - SEVERE: Very SOB at rest, speaks in single words, struggling to breathe, sitting hunched forward, retractions,  pulse > 120      More with exertion, SOB better with rest, no heart racing, chest pain 6. CARDIAC HISTORY: "Do you have any history of heart disease?" (e.g., heart attack, angina, bypass surgery, angioplasty)      denies 7. LUNG HISTORY: "Do you have any history of lung disease?"  (e.g., pulmonary embolus, asthma, emphysema)     Asthma and bronchitis in hospital 9. OTHER SYMPTOMS: "Do you have any other symptoms? (e.g., dizziness, runny nose, cough, chest pain, fever)     Feels exhausted depleted, coughing up beige looking phlegm, comes up in top of chest every day all day long for 2 weeks, can't seem to get it off  Answer Assessment - Initial Assessment Questions 1. LOCATION: "Where does it hurt?"       Left side chest 2. RADIATION: "Does the pain go anywhere else?" (e.g., into neck, jaw, arms, back)     Left arm, left rib cage, left side neck 3. ONSET: "When did the chest pain begin?" (Minutes, hours or days)      1 week ago 4. PATTERN: "Does the pain come and go, or has it been constant since it started?"  "Does it get worse with exertion?"      Comes and goes, worse with exertion 5. DURATION: "How long does it last" (e.g., seconds, minutes, hours)     Comes and goes 6. SEVERITY: "How bad is the pain?"  (e.g., Scale 1-10; mild, moderate, or severe)    - MILD (1-3): doesn't interfere with normal activities     - MODERATE (4-7): interferes with normal activities or awakens from sleep    - SEVERE (8-10): excruciating pain, unable to do any normal activities       5/10 or under probably 4/10 7. CARDIAC RISK FACTORS: "Do you have any history of heart problems or risk factors for heart disease?" (e.g., angina, prior heart attack; diabetes, high blood pressure, high cholesterol, smoker, or strong family history of heart disease)     denies 8. PULMONARY RISK FACTORS: "Do you have any history of lung disease?"  (e.g., blood clots in lung, asthma, emphysema, birth control pills)     asthma 9.  CAUSE: "What do you think is causing the chest pain?"     chest has started hurting I guess from coughing I don't know  10. OTHER SYMPTOMS: "Do you have any other symptoms?" (e.g., dizziness, nausea, vomiting, sweating, fever, difficulty breathing, cough)       Trouble breathing, when I cough feels like my throat or vocal cords inside my throat are closing up  Protocols used: Breathing Difficulty-A-AH, Chest Pain-A-AH

## 2023-07-01 NOTE — Telephone Encounter (Signed)
Patient called requesting to have a referral sent to [facility]. Patient states she does not want to go to [facility] she states she has been waiting too long for an appointment with them. Patient states to put the referral attention to [place].

## 2023-07-01 NOTE — Telephone Encounter (Signed)
Please make sure she gets referred to pulmonology for : Subpleural reticular and ground-glass opacities suggesting early/mild interstitial lung disease. She is requesting referral be sent to [facility]. Please put the referral attention to [facility] per the patient request.

## 2023-07-05 ENCOUNTER — Ambulatory Visit: Payer: Self-pay | Admitting: Family Medicine

## 2023-07-05 NOTE — Telephone Encounter (Signed)
Copied from CRM [id number]. Topic: Clinical - Red Word Triage >> Jul 05, 2023 12:00 PM Florestine Avers wrote: Red Word that prompted transfer to Nurse Triage: Patient called in stating that when she was in the hospital, they told her the Lisinopril she is taking is making her cough and for primary doctor to change the medication. Patient has not taken her medication so her blood pressure is running high. Patient has not checked her blood pressure, due to her not having a cuff at home and is highly concerned. Patient states that her head has been hurting for 3-4 days that is the only symptom of her pressure being high and she does still have a cough. Patient called in requesting a call back in regards to the change in her blood pressure  medication.   Chief Complaint: intermittent headache Symptoms: right eye swelling, now resolved Frequency: ongoing 1 week Pertinent Negatives: Patient denies vision changes Disposition: [] ED /[] Urgent Care (no appt availability in office) / [x] Appointment(In office/virtual)/ []  [place] [facility]/ [] Home Care/ [] Refused Recommended Disposition /[] [place] [facility]/ []  Follow-up with PCP Additional Notes: The patient was hospitalized for 10 days due to an asthma exacerbation and bronchitis.  During her hospital stay, her lisinopril was discontinued due to making her cough.  She missed her hospital follow up appointment.  She has had an intermittent headache for 1 week.  The pain is relieved by ibuprofen but the headache keeps returning. She was advised to start a different bp med but has not been prescribed anything new.  She is concerned that her blood pressure is elevated and that it may be causing the headache.  She is unable to check her bp at home.  Thursday, her right eye swelled and it was painful to touch, yesterday it resolved on it's own. She has been having cramps in her legs hands and feet several weeks ago and has been taking potassium for 4 or 5 weeks but the  cramping continues.  She has been drinking plenty of water.  She was scheduled for a next day appointment for further evaluation.     Reason for Disposition  [1] MODERATE headache (e.g., interferes with normal activities) AND [2] present > 24 hours AND [3] unexplained  (Exceptions: analgesics not tried, typical migraine, or headache part of viral illness)  Answer Assessment - Initial Assessment Questions 1. LOCATION: "Where does it hurt?"      Right temple and top of head; at time of call right side  2. ONSET: "When did the headache start?" (Minutes, hours or days)      Intermittently for 1 week 3. PATTERN: "Does the pain come and go, or has it been constant since it started?"     Comes and goes  4. SEVERITY: "How bad is the pain?" and "What does it keep you from doing?"  (e.g., Scale 1-10; mild, moderate, or severe)   - MILD (1-3): doesn't interfere with normal activities    - MODERATE (4-7): interferes with normal activities or awakens from sleep    - SEVERE (8-10): excruciating pain, unable to do any normal activities        3-4/10 5. RECURRENT SYMPTOM: "Have you ever had headaches before?" If Yes, ask: "When was the last time?" and "What happened that time?"      No 6. CAUSE: "What do you think is causing the headache?"     Possibly elevated BP 7. MIGRAINE: "Have you been diagnosed with migraine headaches?" If Yes, ask: "Is  this headache similar?"      History of migraines 8. HEAD INJURY: "Has there been any recent injury to the head?"      No  9. OTHER SYMPTOMS: "Do you have any other symptoms?" (fever, stiff neck, eye pain, sore throat, cold symptoms)     Asthma and bronchitis flare up - hospitalized 10 days  Protocols used: Headache-A-AH

## 2023-07-06 ENCOUNTER — Encounter: Payer: Self-pay | Admitting: Family Medicine

## 2023-07-06 ENCOUNTER — Ambulatory Visit: Payer: 59 | Admitting: Family Medicine

## 2023-07-06 VITALS — BP 138/85 | HR 95 | Temp 98.8°F | Resp 16 | Ht 61.0 in | Wt 187.2 lb

## 2023-07-06 DIAGNOSIS — J4541 Moderate persistent asthma with (acute) exacerbation: Secondary | ICD-10-CM | POA: Diagnosis not present

## 2023-07-06 DIAGNOSIS — K219 Gastro-esophageal reflux disease without esophagitis: Secondary | ICD-10-CM | POA: Diagnosis not present

## 2023-07-06 DIAGNOSIS — R059 Cough, unspecified: Secondary | ICD-10-CM | POA: Diagnosis not present

## 2023-07-06 DIAGNOSIS — I10 Essential (primary) hypertension: Secondary | ICD-10-CM | POA: Diagnosis not present

## 2023-07-06 LAB — BASIC METABOLIC PANEL
BUN: 21 mg/dL (ref 6–23)
CO2: 26 meq/L (ref 19–32)
Calcium: 9.2 mg/dL (ref 8.4–10.5)
Chloride: 110 meq/L (ref 96–112)
Creatinine, Ser: 1.09 mg/dL (ref 0.40–1.20)
GFR: 51.72 mL/min — ABNORMAL LOW (ref >60.00)
Glucose, Bld: 91 mg/dL (ref 70–99)
Potassium: 4.1 meq/L (ref 3.5–5.1)
Sodium: 143 meq/L (ref 135–145)

## 2023-07-06 LAB — MAGNESIUM: Magnesium: 2.2 mg/dL (ref 1.5–2.5)

## 2023-07-06 LAB — CBC
HCT: 34.5 % — ABNORMAL LOW (ref 36.0–46.0)
Hemoglobin: 11.4 g/dL — ABNORMAL LOW (ref 12.0–15.0)
MCHC: 33.1 g/dL (ref 30.0–36.0)
MCV: 95.3 fL (ref 78.0–100.0)
Platelets: 316 10*3/uL (ref 150.0–400.0)
RBC: 3.62 Mil/uL — ABNORMAL LOW (ref 3.87–5.11)
RDW: 14.7 % (ref 11.5–15.5)
WBC: 6.3 10*3/uL (ref 4.0–10.5)

## 2023-07-06 NOTE — Telephone Encounter (Signed)
Vida Roller,  Could you lend me a hand with this request?  Thanks

## 2023-07-06 NOTE — Patient Instructions (Addendum)
A few things to remember from today's visit:  Hypermagnesemia - Plan: Magnesium  Moderate persistent asthma with acute exacerbation  Essential hypertension, benign - Plan: Basic metabolic panel, CBC  Cough, unspecified type  Try taking Gabapentin all 3 caps at bedtime. No changes in rest. Keep appt with ENT and pulmonologist. Monitor blood pressure daily in the morning after emptying bladder and before dinner for 2 weeks and let me know about readings.  If you need refills for medications you take chronically, please call your pharmacy. Do not use My Chart to request refills or for acute issues that need immediate attention. If you send a my chart message, it may take a few days to be addressed, specially if I am not in the office.  Please be sure medication list is accurate. If a new problem present, please set up appointment sooner than planned today.

## 2023-07-06 NOTE — Progress Notes (Signed)
Chief Complaint  Patient presents with   Headache    Started last week    Cough    Ongoing even after stopping the Lisinopril    HPI: [name] is a 68 y.o. female with a PMHx significant for HTN, OSA, asthma exacerbation, GERD, OA, vitamin D deficiency, prediabetes, HLD, and ductal carcinoma in situ of breast, who is here today for hospital follow up.  TOC call on 06/24/2023.    Patient was hospitalized from 06/14/2023-06/23/2023 for persistent cough despite antibiotic treatment, she completed azithromycin and Prednisone as outpt.  RSV, flu, COVID-negative, respiratory virus panel negative   HH services were not deemed necessary. Independent ADL's and AIDL's.  She has follow ups scheduled with pulmonology and ENT.   Currently, she is still having cough fits, which she has had since Christmas day. Coughing spells, she has not has syncope. She has not identified exacerbating factors. She endorses some fatigue and shortness of breath with activity, but says it is improved by rest.  Also endorses muscle cramping since Christmas.  No LE edema,erythema, orthopnea,or PND. She was started on gabapentin 100 mg 3x daily, which didn't seem to help.  She stopped taking it because it was causing fatigue.   She is also on Benzonatate 200 mg 3x daily prn and guaifenesin-codeine 100-10 mg  twice daily prn.  Asthma: Started on Pulmicort, 2 mL (0.25 mg) nebulized twice daily and Brovana neb 15 mcg bid. Elwin Sleight was discontinued. Discharged on Prednisone taper. Also on  hydroxyzine 25 mg twice daily prn, and Singulair 10 mg at bedtime.   Swallowing study 06/18/23, possible reflux constituting to cough. ENT evaluation, possible VCD.  Started on pantoprazole 40 mg two times daily.  Negative for stridor. Pertinent negatives include fever,chills, heartburn, nausea, vomiting,or wheezing.   She reports a right sided headache last night that resolved with tylenol.  Not having headache today.   Negative for associated symptoms.   1. Negative for acute pulmonary embolism. 2. Subpleural reticular and ground-glass opacities suggesting early/mild interstitial lung disease.  Neck/soft tissue CT 06/17/23: 1. No mass or fluid collection in the neck. 2. Diffuse idiopathic skeletal hyperostosis of the cervical spine with prominent anterior osteophytes. Aortic Atherosclerosis (ICD10-I70.0).  HyperMg++: Mg 2.6. and 3.0. She is not on Mg supplementation. Not taking magnesium supplementation.   Hypertension:  Medications: Currently on hydrochlorothiazide 12.5 mg daily.  Lisinopril was discontinued prior to admission. She has not been checking her BP at home because she does not have a cuff.  Her BP in the office today is 144/84. Upon recheck it is 138/85.   Lab Results  Component Value Date   CREATININE 0.85 06/22/2023   BUN 25 (H) 06/22/2023   NA 138 06/22/2023   K 3.7 06/22/2023   CL 109 06/22/2023   CO2 20 (L) 06/22/2023   Review of Systems  Constitutional:  Positive for activity change and fatigue. Negative for appetite change and chills.  HENT:  Negative for mouth sores, sore throat and trouble swallowing.   Cardiovascular:  Negative for chest pain, palpitations and leg swelling.  Gastrointestinal:  Negative for abdominal pain, nausea and vomiting.  Endocrine: Negative for cold intolerance and heat intolerance.  Genitourinary:  Negative for decreased urine volume, dysuria and hematuria.  Skin:  Negative for rash.  Allergic/Immunologic: Positive for environmental allergies.  Neurological:  Negative for syncope, facial asymmetry and weakness.  Psychiatric/Behavioral:  Positive for sleep disturbance. Negative for confusion.    Current Outpatient Medications on File Prior to  Visit  Medication Sig Dispense Refill   albuterol (PROVENTIL) (2.5 MG/3ML) 0.083% nebulizer solution Take 3 mLs (2.5 mg total) by nebulization every 4 (four) hours as needed for wheezing or shortness of  breath. 75 mL 1   albuterol (VENTOLIN HFA) 108 (90 Base) MCG/ACT inhaler Use 2 puffs every 4 hours as needed for cough or wheeze.  May use  2 puffs 10-20 minutes prior to exercise. (Patient taking differently: Inhale 2 puffs into the lungs every 4 (four) hours as needed for wheezing or shortness of breath (or coughing and may also use 2 puffs 10 to 20 minutes prior to exercise).) 18 g 1   arformoterol (BROVANA) 15 MCG/2ML NEBU Inhale 2 mLs (15 mcg total) by nebulization 2 (two) times daily. 120 mL 3   azelastine (ASTELIN) 0.1 % nasal spray Place 2 sprays into both nostrils 2 (two) times daily. Use in each nostril as directed 30 mL 12   benzonatate (TESSALON) 200 MG capsule Take 1 capsule (200 mg total) by mouth 3 (three) times daily as needed. 90 capsule 1   budesonide (PULMICORT) 0.25 MG/2ML nebulizer solution Take 2 mLs (0.25 mg total) by nebulization 2 (two) times daily. generic 60 mL 12   gabapentin (NEURONTIN) 100 MG capsule Take 1 capsule (100 mg total) by mouth 3 (three) times daily. 90 capsule 1   guaiFENesin (MUCINEX) 600 MG 12 hr tablet Take 1 tablet (600 mg total) by mouth 2 (two) times daily. 60 tablet 0   guaiFENesin-codeine 100-10 MG/5ML syrup Take 5 mLs by mouth every 12 (twelve) hours as needed for cough. 120 mL 0   hydrochlorothiazide (HYDRODIURIL) 12.5 MG tablet Take 1 tablet (12.5 mg total) by mouth daily. 90 tablet 3   hydrOXYzine (ATARAX) 25 MG tablet Take 1 tablet (25 mg total) by mouth 2 (two) times daily as needed for anxiety. 30 tablet 0   ibuprofen (ADVIL) 800 MG tablet Take 800 mg by mouth 2 (two) times daily as needed (for pain).     montelukast (SINGULAIR) 10 MG tablet Take 1 tablet (10 mg total) by mouth at bedtime. 30 tablet 5   ondansetron (ZOFRAN) 4 MG tablet Take 1 tablet (4 mg total) by mouth every 6 (six) hours as needed for nausea or vomiting. 20 tablet 0   pantoprazole (PROTONIX) 40 MG tablet Take 1 tablet (40 mg total) by mouth 2 (two) times daily before a meal.  60 tablet 3   potassium chloride SA (KLOR-CON M) 20 MEQ tablet Take 1 tablet (20 mEq total) by mouth daily. 90 tablet 3   PRESCRIPTION MEDICATION See admin instructions. CPAP- At bedtime     tamoxifen (NOLVADEX) 20 MG tablet Take 20 mg by mouth daily.     Vitamin D, Ergocalciferol, (DRISDOL) 1.25 MG (50000 UNIT) CAPS capsule Take 1 capsule (50,000 Units total) by mouth every 7 (seven) days. (Patient taking differently: Take 50,000 Units by mouth every Saturday.) 12 capsule 0   No current facility-administered medications on file prior to visit.    Past Medical History:  Diagnosis Date   Arthritis    Asthma    Blindness, legal    L EYE   Breast cancer (HCC)    GERD (gastroesophageal reflux disease)    Hearing loss    Hyperlipidemia    Hypertension    Lapband May 2013 11/18/2011   Migraines    Obesity, Class III, BMI 40-49.9 (morbid obesity) (HCC) 06/03/2011   Pre-diabetes    Sleep apnea    uses c-pap  Vertigo    Allergies  Allergen Reactions   Hydrocodone Hives, Shortness Of Breath and Itching   Lisinopril Cough   Sulfa Antibiotics Hives    Social History   Socioeconomic History   Marital status: Married    Spouse name: Not on file   Number of children: Not on file   Years of education: Not on file   Highest education level: Not on file  Occupational History   Not on file  Tobacco Use   Smoking status: Former    Current packs/day: 0.00    Average packs/day: 0.5 packs/day for 15.0 years (7.5 ttl pk-yrs)    Types: Cigarettes    Start date: 06/03/1979    Quit date: 06/02/1994    Years since quitting: 29.1   Smokeless tobacco: Never  Vaping Use   Vaping status: Never Used  Substance and Sexual Activity   Alcohol use: No   Drug use: No   Sexual activity: Not Currently    Birth control/protection: Surgical    Comment: hyst  Other Topics Concern   Not on file  Social History Narrative   Work or School: works part time with [employer] - 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       Home Situation: lives with husband      Spiritual Beliefs: Christian  - Gaffer, Higher education careers adviser      Lifestyle: wt watchers 2019, starting to exercise         Social Drivers of Health   Financial Resource Strain: Low Risk  (04/01/2022)   Overall Financial Resource Strain (CARDIA)    Difficulty of Paying Living Expenses: Not very hard  Food Insecurity: No Food Insecurity (06/14/2023)   Hunger Vital Sign    Worried About Running Out of Food in the Last Year: Never true    Ran Out of Food in the Last Year: Never true  Transportation Needs: No Transportation Needs (06/14/2023)   PRAPARE - Administrator, Civil Service (Medical): No    Lack of Transportation (Non-Medical): No  Physical Activity: Inactive (10/06/2021)   Exercise Vital Sign    Days of Exercise per Week: 0 days    Minutes of Exercise per Session: 0 min  Stress: No Stress Concern Present (10/06/2021)   [employer] of Occupational Health - Occupational Stress Questionnaire    Feeling of Stress : Not at all  Social Connections: Unknown (06/14/2023)   Social Connection and Isolation Panel [NHANES]    Frequency of Communication with Friends and Family: More than three times a week    Frequency of Social Gatherings with Friends and Family: Twice a week    Attends Religious Services: Patient declined    Database administrator or Organizations: Patient declined    Attends Banker Meetings: Patient declined    Marital Status: Married   Today's Vitals   07/06/23 0915 07/06/23 0943  BP: (!) 144/84 138/85  Pulse: 95   Resp: 16   Temp: 98.8 F (37.1 C)   TempSrc: Oral   SpO2: 92%   Weight: 187 lb 4 oz (84.9 kg)   Height: 5\' 1"  (1.549 m)    Body mass index is 35.38 kg/m.  Physical Exam Vitals and nursing note reviewed.  Constitutional:      General: She is not in acute distress.    Appearance: She is well-developed.  HENT:     Head:  Normocephalic and atraumatic.     Mouth/Throat:  Mouth: Mucous membranes are moist.     Pharynx: Oropharynx is clear. Uvula midline.  Eyes:     Conjunctiva/sclera: Conjunctivae normal.  Neck:     Vascular: No JVD.  Cardiovascular:     Rate and Rhythm: Normal rate and regular rhythm.     Pulses:          Dorsalis pedis pulses are 2+ on the right side and 2+ on the left side.     Heart sounds: No murmur heard. Pulmonary:     Effort: Pulmonary effort is normal. No respiratory distress.     Breath sounds: Normal breath sounds.     Comments: A couple episodes of non productive coughing spells followed by a period of hyperventilation. No cysnosis. Abdominal:     Palpations: Abdomen is soft. There is no hepatomegaly or mass.     Tenderness: There is no abdominal tenderness.  Musculoskeletal:     Right lower leg: No edema.     Left lower leg: No edema.  Lymphadenopathy:     Cervical: No cervical adenopathy.  Skin:    General: Skin is warm.     Findings: No erythema or rash.  Neurological:     General: No focal deficit present.     Mental Status: She is alert and oriented to person, place, and time.     Cranial Nerves: No cranial nerve deficit.     Gait: Gait normal.  Psychiatric:        Mood and Affect: Mood and affect normal.   ASSESSMENT AND PLAN:  Ms. Digaetano was seen today for hospital follow up and chronic headache.  Lab Results  Component Value Date   WBC 6.3 07/06/2023   HGB 11.4 (L) 07/06/2023   HCT 34.5 (L) 07/06/2023   MCV 95.3 07/06/2023   PLT 316.0 07/06/2023   Lab Results  Component Value Date   NA 143 07/06/2023   CL 110 07/06/2023   K 4.1 07/06/2023   CO2 26 07/06/2023   BUN 21 07/06/2023   CREATININE 1.09 07/06/2023   GFR 51.72 (L) 07/06/2023   CALCIUM 9.2 07/06/2023   PHOS 3.1 06/18/2023   ALBUMIN 3.4 (L) 06/18/2023   GLUCOSE 91 07/06/2023   Cough, unspecified type Problem has not significantly improved. She has appts scheduled with ENT and  pulmonologist, 2/6 and 08/11/23 respectively. Recommend resuming Gabapentin , take 100 mg x 3 at bedtime, side effects discussed. No changes in Benzonatate or guanfacine with codeine. Instructed about warning signs.  Hypermagnesemia 2.6 at the time of discharge. Further recommendations will be given according to Mg result.  -     Magnesium; Future  Moderate persistent asthma with acute exacerbation Pulmonology consultation during hospitalization. Discharged on Pulmicort neb and Brovana neb 15 mcg bid. She is also on Albuterol inh nebs q 4-6 hours prn.  Gastroesophageal reflux disease, unspecified whether esophagitis present  Could be contributing to cough. Continue aspiration precautions. Currently on Pantoprazole 40 mg daily. Continue GERD precautions.  Essential hypertension, benign BP mildly elevated today. For now continue hydrochlorothiazide 12.5 mg daily and low salt diet. Instructed to monitor BP daily and to let me know about BP readings in 2 weeks.  -     Basic metabolic panel; Future -     CBC; Future  Return in about 4 months (around 11/03/2023) for chronic problems with PCP.Trula Ore, acting as a scribe for Brant Peets [place], MD., have documented all relevant documentation on the behalf of Diem Pagnotta [place], MD,  as directed by  Jandy Brackens [place], MD while in the presence of Migel Hannis [place], MD.   I, Butler Vegh [place], MD, have reviewed all documentation for this visit. The documentation on 07/06/23 for the exam, diagnosis, procedures, and orders are all accurate and complete.  Eliab Closson G. [place], MD  [facility]. [place] office.

## 2023-07-08 ENCOUNTER — Encounter: Payer: Self-pay | Admitting: Family Medicine

## 2023-07-08 NOTE — Telephone Encounter (Signed)
Patient was seen in clinic the next day for this concern.

## 2023-07-08 NOTE — Telephone Encounter (Signed)
Coye has been referred to:  [facility] at [facility] [address], [place]   ([place]) [phone]   I have faxed the referral and all corresponding notes to their office made to the attention of Toniann Fail.  They will reach out to her and schedule this appointment.  A message was left to [facility] via [employer].

## 2023-07-13 ENCOUNTER — Ambulatory Visit: Payer: 59 | Admitting: Hematology and Oncology

## 2023-07-21 ENCOUNTER — Other Ambulatory Visit (HOSPITAL_COMMUNITY): Payer: Self-pay

## 2023-07-30 ENCOUNTER — Ambulatory Visit (INDEPENDENT_AMBULATORY_CARE_PROVIDER_SITE_OTHER): Payer: Medicare Other | Admitting: Family Medicine

## 2023-07-30 ENCOUNTER — Encounter: Payer: Self-pay | Admitting: Family Medicine

## 2023-07-30 VITALS — BP 138/76 | HR 88 | Temp 98.8°F | Ht 61.0 in | Wt 185.8 lb

## 2023-07-30 DIAGNOSIS — J4540 Moderate persistent asthma, uncomplicated: Secondary | ICD-10-CM

## 2023-07-30 DIAGNOSIS — R053 Chronic cough: Secondary | ICD-10-CM | POA: Diagnosis not present

## 2023-07-30 DIAGNOSIS — T466X5A Adverse effect of antihyperlipidemic and antiarteriosclerotic drugs, initial encounter: Secondary | ICD-10-CM

## 2023-07-30 DIAGNOSIS — R0982 Postnasal drip: Secondary | ICD-10-CM

## 2023-07-30 DIAGNOSIS — I214 Non-ST elevation (NSTEMI) myocardial infarction: Secondary | ICD-10-CM | POA: Diagnosis not present

## 2023-07-30 DIAGNOSIS — M791 Myalgia, unspecified site: Secondary | ICD-10-CM | POA: Diagnosis not present

## 2023-07-30 DIAGNOSIS — I10 Essential (primary) hypertension: Secondary | ICD-10-CM

## 2023-07-30 DIAGNOSIS — Z853 Personal history of malignant neoplasm of breast: Secondary | ICD-10-CM

## 2023-07-30 NOTE — Progress Notes (Signed)
 Established Patient Office Visit   Subjective  Patient ID: Rebecca [name], female    DOB: 1956/04/21  Age: 68 y.o. MRN: [id number]  Chief Complaint  Patient presents with   Abdominal Pain    Leg, and feet and hand cramps, patient would like to go over medications given during hospital stay     Patient is a 68 year old female seen for follow-up.  Patient hospitalized 12/30 and 1/25 06/14/23-06/23/2023  for severe persistent asthma with exacerbation.  In ED pt in resp distress d/t coughing and wheezing, not requiring O2. CXR neg.  Prior to hospitalization pt presented to ED x 3.   CTA chest from 12/18 visit with early signs of ILD.  Rx'd gabapentin 100 mg TID for cough with plans to wean by Pulm.   Pt hospitalized again on 07/10/23 for CP thought due to NSTEMI, trpn 168.  LHC was negative.  Pt d/c'd home on lipitor 80 mg daily and ASA 81 mg due to CVD risk factors.  Since d/c pt feels like she is getting better but still coughing.  Has Pulm appt next wkt.  Endorses consistent pain in b/l feet, legs, flanks, and cramping in fingers daily since second hospitalization.  Patient Active Problem List   Diagnosis Date Noted   GERD (gastroesophageal reflux disease) 06/18/2023   Acute asthma exacerbation 06/15/2023   Asthma exacerbation 06/14/2023   Abnormal CT of the chest 06/04/2023   Failed total knee arthroplasty (HCC) 12/28/2022   Failed total right knee replacement (HCC) 12/28/2022   Genetic testing 04/01/2022   Ductal carcinoma in situ of breast 03/26/2022   OA (osteoarthritis) of knee 08/18/2021   S/P total knee arthroplasty, right 08/18/2021   Prediabetes 12/29/2019   Vitamin D deficiency 12/29/2019   Class 2 obesity due to excess calories with serious comorbidity and body mass index (BMI) of 38.0 to 38.9 in adult 08/31/2016   Hyperlipidemia 05/04/2016   Essential hypertension, benign 12/26/2012   Asthma 12/26/2012   OSA (obstructive sleep apnea) 10/05/2011   Past Medical History:   Diagnosis Date   Arthritis    Asthma    Blindness, legal    L EYE   Breast cancer (HCC)    GERD (gastroesophageal reflux disease)    Hearing loss    Hyperlipidemia    Hypertension    Lapband May 2013 11/18/2011   Migraines    Obesity, Class III, BMI 40-49.9 (morbid obesity) (HCC) 06/03/2011   Pre-diabetes    Sleep apnea    uses c-pap   Vertigo    Past Surgical History:  Procedure Laterality Date   ABDOMINAL HYSTERECTOMY  2006   fibroids   BREAST LUMPECTOMY WITH RADIOACTIVE SEED LOCALIZATION Left 04/21/2022   Procedure: LEFT BREAST LUMPECTOMY WITH RADIOACTIVE SEED LOCALIZATION;  Surgeon: Manus Rudd, MD;  Location: [facility];  Service: General;  Laterality: Left;   BREATH TEK H PYLORI  08/17/2011   Procedure: BREATH TEK H PYLORI;  Surgeon: Valarie Merino, MD;  Location: [facility];  Service: General;  Laterality: N/A;  PATIENT WILL COME AT 0715   COLONOSCOPY  2008   @ [facility]    EYE SURGERY     Patient unsure of surgery date. Left eye   KNEE SURGERY Right 1992   right knee arthroscopy   LAPAROSCOPIC GASTRIC BANDING  10/20/2011   Procedure: LAPAROSCOPIC GASTRIC BANDING;  Surgeon: Valarie Merino, MD;  Location: [facility];  Service: General;  Laterality: N/A;   RE-EXCISION OF BREAST LUMPECTOMY Left 05/05/2022  Procedure: RE-EXCISION OF LEFT BREAST LUMPECTOMY SITE POSTERIOR MARGIN;  Surgeon: Manus Rudd, MD;  Location: [facility];  Service: General;  Laterality: Left;   TOTAL KNEE ARTHROPLASTY Right 08/18/2021   Procedure: TOTAL KNEE ARTHROPLASTY;  Surgeon: Ollen Gross, MD;  Location: [facility];  Service: Orthopedics;  Laterality: Right;   TOTAL KNEE REVISION Right 12/28/2022   Procedure: Right knee femoral versus total knee arthroplasty revision;  Surgeon: Ollen Gross, MD;  Location: [facility];  Service: Orthopedics;  Laterality: Right;   Social History   Tobacco Use   Smoking status: Former    Current packs/day: 0.00    Average  packs/day: 0.5 packs/day for 15.0 years (7.5 ttl pk-yrs)    Types: Cigarettes    Start date: 06/03/1979    Quit date: 06/02/1994    Years since quitting: 29.1   Smokeless tobacco: Never  Vaping Use   Vaping status: Never Used  Substance Use Topics   Alcohol use: No   Drug use: No   Family History  Problem Relation Age of Onset   Breast cancer Mother 64   Alcohol abuse Father    Heart disease Father 26       MI   Hypertension Father    Colon cancer Brother        dx after 66   Breast cancer Daughter 3   Allergies  Allergen Reactions   Hydrocodone Hives, Shortness Of Breath and Itching   Lisinopril Cough   Sulfa Antibiotics Hives      ROS Negative unless stated above    Objective:     BP 138/76 (BP Location: Left Arm, Patient Position: Sitting, Cuff Size: Large)   Pulse 88   Temp 98.8 F (37.1 C) (Oral)   Ht 5\' 1"  (1.549 m)   Wt 185 lb 12.8 oz (84.3 kg)   SpO2 96%   BMI 35.11 kg/m  BP Readings from Last 3 Encounters:  07/30/23 138/76  07/06/23 138/85  06/23/23 (!) 148/84   Wt Readings from Last 3 Encounters:  07/30/23 185 lb 12.8 oz (84.3 kg)  07/06/23 187 lb 4 oz (84.9 kg)  06/14/23 191 lb 1.3 oz (86.7 kg)      Physical Exam Constitutional:      General: She is not in acute distress.    Appearance: Normal appearance.  HENT:     Head: Normocephalic and atraumatic.     Nose: Nose normal.     Mouth/Throat:     Mouth: Mucous membranes are moist.  Cardiovascular:     Rate and Rhythm: Normal rate and regular rhythm.     Heart sounds: Normal heart sounds. No murmur heard.    No gallop.  Pulmonary:     Effort: Pulmonary effort is normal. No respiratory distress.     Breath sounds: Normal breath sounds. No wheezing, rhonchi or rales.  Skin:    General: Skin is warm and dry.  Neurological:     Mental Status: She is alert and oriented to person, place, and time.     No results found for any visits on 07/30/23.    Assessment & Plan:     History of breast cancer -continue tamoxifen  NSTEMI (non-ST elevated myocardial infarction) (HCC) -Troponin initially elevated 201, type II NSTEMI suspected.  Admission EKG without acute ischemic chanages -TTE with EF 60-65%, diastolic dysfunction with trace pericardial effusion. -Cardiac cath 07/13/23 negative -continue ASA 81 mg daily. -lipitor 80 mg being held 2/2 new myalgias.  Consider low dose statin  a few times per wk if myalgias resolve with holding lipitor. -continue lisinopril  Chronic cough -discussed possible causes including asthma exacerbation, medication-lisinopril, seasonal allergies  Moderate persistent asthma, unspecified whether complicated -improving -continue inhalers: Brovana 15 mcg, Pulmicort .25 mg and albuterol prn -discussed gabapentin wean from 100 mg TID to BID.  Discuss further at upcoming pulm appt.  Myalgia due to statin  Essential hypertension -controlled -continue lisinopril -lifestyle modifications  Post-nasal drainage -antihistamine, singulair  Pt seen for f/u on chronic and ongoing conditions s/p hospitalizations for asthma and subsequent PC.  Now with myalgias.  Given timing of myalgias, sx likely due to statin.  In the past had concerns over statin induced myalgia with low dose pravastatin.  Will have pt d/c lipitor 80 mg to seen if myalgias resolve.  Additional work up if myalgias continue.  NSTEMI with negative PCI.    Return in about 3 months (around 10/27/2023), or if symptoms worsen or fail to improve.   [name], MD

## 2023-07-30 NOTE — Patient Instructions (Signed)
Stop statin

## 2023-08-11 ENCOUNTER — Ambulatory Visit: Payer: 59 | Admitting: Internal Medicine

## 2023-09-02 ENCOUNTER — Telehealth: Payer: Self-pay | Admitting: *Deleted

## 2023-09-02 NOTE — Telephone Encounter (Signed)
 Copied from CRM [id number]. Topic: Referral - Request for Referral >> Sep 02, 2023 12:34 PM Florestine Avers wrote: Did the patient discuss referral with their provider in the last year? Yes (If No - schedule appointment) (If Yes - send message)  Appointment offered? Yes  Type of order/referral and detailed reason for visit: Patient called in stating that she would like a referral to a G.I doctor because she is still having trouble. She went to pulmonary where they did an esophagus test and they said it is narrowing.   Preference of office, provider, location: Patient does not have a place in mine she said where ever Dr. Salomon Fick recommends.  If referral order, have you been seen by this specialty before? No (If Yes, this issue or another issue? When? Where?  Can we respond through MyChart? Yes   Patient is requesting a call back once the referral is sent with information on where it was sent.

## 2023-09-03 NOTE — Telephone Encounter (Signed)
 A referral was already placed on 09/02/23 per chart review.

## 2023-09-06 NOTE — Telephone Encounter (Signed)
 Called patient left a VM to return call, referral has been place to [facility]  Located in: [facility] [address] Address: [address], [place], [place] [address] Phone: [phone]

## 2023-10-20 ENCOUNTER — Encounter (HOSPITAL_COMMUNITY): Payer: Self-pay

## 2023-10-27 ENCOUNTER — Ambulatory Visit (INDEPENDENT_AMBULATORY_CARE_PROVIDER_SITE_OTHER): Payer: 59 | Admitting: Family Medicine

## 2023-10-27 ENCOUNTER — Encounter: Payer: Self-pay | Admitting: Family Medicine

## 2023-10-27 VITALS — BP 144/86 | HR 75 | Temp 99.0°F | Ht 61.0 in | Wt 186.4 lb

## 2023-10-27 DIAGNOSIS — E782 Mixed hyperlipidemia: Secondary | ICD-10-CM

## 2023-10-27 DIAGNOSIS — K219 Gastro-esophageal reflux disease without esophagitis: Secondary | ICD-10-CM | POA: Diagnosis not present

## 2023-10-27 DIAGNOSIS — R053 Chronic cough: Secondary | ICD-10-CM

## 2023-10-27 DIAGNOSIS — R1314 Dysphagia, pharyngoesophageal phase: Secondary | ICD-10-CM | POA: Insufficient documentation

## 2023-10-27 DIAGNOSIS — Z9884 Bariatric surgery status: Secondary | ICD-10-CM | POA: Insufficient documentation

## 2023-10-27 DIAGNOSIS — I10 Essential (primary) hypertension: Secondary | ICD-10-CM | POA: Diagnosis not present

## 2023-10-27 DIAGNOSIS — I252 Old myocardial infarction: Secondary | ICD-10-CM | POA: Insufficient documentation

## 2023-10-27 NOTE — Patient Instructions (Addendum)
 Referral was placed for second opinion at [facility] surgery.  I have included some information about esophageal webs and acid reflux for you to read over.

## 2023-10-27 NOTE — Progress Notes (Signed)
 Established Patient Office Visit   Subjective  Patient ID: [name], female    DOB: 03/11/1956  Age: 68 y.o. MRN: [id number]  Chief Complaint  Patient presents with   Medical Management of Chronic Issues    3 month follow-up, N-stemi and hypertension, asthma, Gi issues     Patient is a 68 year old female seen for follow-up.  Patient states she is doing well overall.  Still having issues with intermittent dysphagia.  Sensation can occur with water  had follow-up with GI 10/20/2023.  EGD discussed esophageal web may be contributing to symptoms.  Patient continues to have cough.  Advised may be associated with GERD worsened by history of gastric lap band. Was on pantoprazole  but has not in several weeks.  Patient denies overt heartburn symptoms such as burning or pressure in the chest.  Does note increased phlegm difficult to expectorate, worse in a.m and hoarse voice.  Pt states several providers have advised her to consider removing Lap-Band.  Patient had an appointment this morning to discuss but would like a second opinion.  Patient thinks BP is elevated this visit due to rushing/almost being late to appointment due to delays at surg appointment this morning.  Does not have a BP cuff at home.    Patient Active Problem List   Diagnosis Date Noted   GERD (gastroesophageal reflux disease) 06/18/2023   Acute asthma exacerbation 06/15/2023   Asthma exacerbation 06/14/2023   Abnormal CT of the chest 06/04/2023   Failed total knee arthroplasty (HCC) 12/28/2022   Failed total right knee replacement (HCC) 12/28/2022   Genetic testing 04/01/2022   Ductal carcinoma in situ of breast 03/26/2022   OA (osteoarthritis) of knee 08/18/2021   S/P total knee arthroplasty, right 08/18/2021   Prediabetes 12/29/2019   Vitamin D  deficiency 12/29/2019   Class 2 obesity due to excess calories with serious comorbidity and body mass index (BMI) of 38.0 to 38.9 in adult 08/31/2016   Hyperlipidemia  05/04/2016   Essential hypertension, benign 12/26/2012   Asthma 12/26/2012   OSA (obstructive sleep apnea) 10/05/2011   Past Medical History:  Diagnosis Date   Arthritis    Asthma    Blindness, legal    L EYE   Breast cancer (HCC)    GERD (gastroesophageal reflux disease)    Hearing loss    Hyperlipidemia    Hypertension    Lapband May 2013 11/18/2011   Migraines    Obesity, Class III, BMI 40-49.9 (morbid obesity) 06/03/2011   Pre-diabetes    Sleep apnea    uses c-pap   Vertigo    Past Surgical History:  Procedure Laterality Date   ABDOMINAL HYSTERECTOMY  2006   fibroids   BREAST LUMPECTOMY WITH RADIOACTIVE SEED LOCALIZATION Left 04/21/2022   Procedure: LEFT BREAST LUMPECTOMY WITH RADIOACTIVE SEED LOCALIZATION;  Surgeon: Dareen Ebbing, MD;  Location: [facility];  Service: General;  Laterality: Left;   BREATH TEK H PYLORI  08/17/2011   Procedure: BREATH TEK H PYLORI;  Surgeon: Azucena Bollard, MD;  Location: [facility];  Service: General;  Laterality: N/A;  PATIENT WILL COME AT 0715   COLONOSCOPY  2008   @ [facility]    EYE SURGERY     Patient unsure of surgery date. Left eye   KNEE SURGERY Right 1992   right knee arthroscopy   LAPAROSCOPIC GASTRIC BANDING  10/20/2011   Procedure: LAPAROSCOPIC GASTRIC BANDING;  Surgeon: Azucena Bollard, MD;  Location: [facility];  Service: General;  Laterality:  N/A;   RE-EXCISION OF BREAST LUMPECTOMY Left 05/05/2022   Procedure: RE-EXCISION OF LEFT BREAST LUMPECTOMY SITE POSTERIOR MARGIN;  Surgeon: Dareen Ebbing, MD;  Location: [facility];  Service: General;  Laterality: Left;   TOTAL KNEE ARTHROPLASTY Right 08/18/2021   Procedure: TOTAL KNEE ARTHROPLASTY;  Surgeon: Liliane Rei, MD;  Location: [facility];  Service: Orthopedics;  Laterality: Right;   TOTAL KNEE REVISION Right 12/28/2022   Procedure: Right knee femoral versus total knee arthroplasty revision;  Surgeon: Liliane Rei, MD;  Location: [facility];  Service:  Orthopedics;  Laterality: Right;   Social History   Tobacco Use   Smoking status: Former    Current packs/day: 0.00    Average packs/day: 0.5 packs/day for 15.0 years (7.5 ttl pk-yrs)    Types: Cigarettes    Start date: 06/03/1979    Quit date: 06/02/1994    Years since quitting: 29.4   Smokeless tobacco: Never  Vaping Use   Vaping status: Never Used  Substance Use Topics   Alcohol use: No   Drug use: No   Family History  Problem Relation Age of Onset   Breast cancer Mother 86   Alcohol abuse Father    Heart disease Father 24       MI   Hypertension Father    Colon cancer Brother        dx after 65   Breast cancer Daughter 98   Allergies  Allergen Reactions   Hydrocodone Hives, Shortness Of Breath and Itching   Lisinopril  Cough   Sulfa Antibiotics Hives    ROS Negative unless stated above    Objective:      BP (!) 142/86 (BP Location: Left Arm, Patient Position: Sitting, Cuff Size: Normal)   Pulse 75   Temp 99 F (37.2 C) (Oral)   Ht 5\' 1"  (1.549 m)   Wt 186 lb 6.4 oz (84.6 kg)   SpO2 92%   BMI 35.22 kg/m  BP Readings from Last 3 Encounters:  10/27/23 (!) 142/86  07/30/23 138/76  07/06/23 138/85   Wt Readings from Last 3 Encounters:  10/27/23 186 lb 6.4 oz (84.6 kg)  07/30/23 185 lb 12.8 oz (84.3 kg)  07/06/23 187 lb 4 oz (84.9 kg)      Physical Exam Constitutional:      General: She is not in acute distress.    Appearance: Normal appearance.  HENT:     Head: Normocephalic and atraumatic.     Nose: Nose normal.     Mouth/Throat:     Mouth: Mucous membranes are moist.  Cardiovascular:     Rate and Rhythm: Normal rate and regular rhythm.     Heart sounds: Normal heart sounds. No murmur heard.    No gallop.  Pulmonary:     Effort: Pulmonary effort is normal. No respiratory distress.     Breath sounds: Normal breath sounds. No wheezing, rhonchi or rales.  Skin:    General: Skin is warm and dry.  Neurological:     Mental Status: She is  alert and oriented to person, place, and time.        04/21/2023   11:45 AM 10/13/2022    3:48 PM 08/12/2022    8:28 AM  Depression screen PHQ 2/9  Decreased Interest 1 1 1   Down, Depressed, Hopeless 1 1 0  PHQ - 2 Score 2 2 1   Altered sleeping 1 1 1   Tired, decreased energy 1 1 3   Change in appetite 0 0 0  Feeling bad or failure about yourself  0 0 0  Trouble concentrating 0 0 1  Moving slowly or fidgety/restless 0 1 0  Suicidal thoughts 0 1 0  PHQ-9 Score 4 6 6   Difficult doing work/chores Somewhat difficult  Somewhat difficult      04/21/2023   11:45 AM 12/27/2019   12:01 PM  GAD 7 : Generalized Anxiety Score  Nervous, Anxious, on Edge 1 0  Control/stop worrying 1 0  Worry too much - different things 1 0  Trouble relaxing 1 1  Restless 0 0  Easily annoyed or irritable 1 1  Afraid - awful might happen 0 0  Total GAD 7 Score 5 2  Anxiety Difficulty Not difficult at all Not difficult at all     No results found for any visits on 10/27/23.    Assessment & Plan:   Gastroesophageal reflux disease, unspecified whether esophagitis present -     Ambulatory referral to General Surgery  Pharyngoesophageal dysphagia -     Ambulatory referral to General Surgery  Essential hypertension  Mixed hyperlipidemia  History of laparoscopic adjustable gastric banding -     Ambulatory referral to General Surgery  History of non-ST elevation myocardial infarction (NSTEMI)  Chronic cough  Referral placed to [facility] Surgery second opinion regarding Lap-Band removal.  To schedule EGD with GI esophageal web, other causes of dysphagia and gastritis.  Patient advised to keep a food diary of foods that cause symptoms.  Pantoprazole  as needed.  Discussed possible causes of chronic cough including GERD, allergies, centrilobular emphysema.  Continue follow-up with pulmonary.  BP elevated.  Recheck remained elevated.  Discussed importance of lifestyle modifications.  Patient advised  to obtain a BP cuff for home use.  Continue HCTZ 12.5 mg daily.  For continued BP elevation consistently greater than 140/90 consider addition of Norvasc .  Continue Lipitor 80 mg daily for history of NSTEMI.   On day of service, 33 minutes spent caring for this patient face-to-face, reviewing the chart, counseling and/or coordinating care for plan and treatment of diagnosis below.    Return in about 4 months (around 02/27/2024), or if symptoms worsen or fail to improve.   Viola Greulich, MD

## 2023-10-31 ENCOUNTER — Other Ambulatory Visit: Payer: Self-pay | Admitting: Hematology and Oncology

## 2023-11-02 ENCOUNTER — Other Ambulatory Visit: Payer: Self-pay | Admitting: *Deleted

## 2023-11-02 DIAGNOSIS — D0512 Intraductal carcinoma in situ of left breast: Secondary | ICD-10-CM

## 2023-11-02 NOTE — Progress Notes (Addendum)
 COVID Vaccine received:  []  No [x]  Yes Date of any COVID positive Test in last 90 days: no PCP - Cinda Craze MD Cardiologist - n/a  Chest x-ray - 06/14/23 Epic EKG -  07/20/23 EPIC Stress Test -  ECHO -  Cardiac Cath -   Bowel Prep - [x]  No  []   Yes ______  Pacemaker / ICD device [x]  No []  Yes   Spinal Cord Stimulator:[x]  No []  Yes       History of Sleep Apnea? []  No [x]  Yes   CPAP used?- [x]  No []  Yes    Does the patient monitor blood sugar?          [x]  No []  Yes  []  N/A  Patient has: [x]  NO Hx DM   []  Pre-DM                 []  DM1  []   DM2 Does patient have a [employer] or [employer]? []  No []  Yes   Fasting Blood Sugar Ranges-  Checks Blood Sugar _____ times a day  GLP1 agonist / usual dose - no GLP1 instructions:  SGLT-2 inhibitors / usual dose - no SGLT-2 instructions:   Blood Thinner / Instructions:no Aspirin  Instructions:no  Comments:   Activity level: Patient is able to climb a flight of stairs without difficulty; [x]  No CP  [x]  No SOB,  Patient can  perform ADLs without assistance.   Anesthesia review:   Patient denies shortness of breath, fever, cough and chest pain at PAT appointment.  Patient verbalized understanding and agreement to the Pre-Surgical Instructions that were given to them at this PAT appointment. Patient was also educated of the need to review these PAT instructions again prior to his/her surgery.I reviewed the appropriate phone numbers to call if they have any and questions or concerns.

## 2023-11-02 NOTE — Patient Instructions (Addendum)
 SURGICAL WAITING ROOM VISITATION  Patients having surgery or a procedure may have no more than 2 support people in the waiting area - these visitors may rotate.    Children under the age of 80 must have an adult with them who is not the patient.  Due to an increase in RSV and influenza rates and associated hospitalizations, children ages 44 and under may not visit patients in [name] hospitals.  Visitors with respiratory illnesses are discouraged from visiting and should remain at home.  If the patient needs to stay at the hospital during part of their recovery, the visitor guidelines for inpatient rooms apply. Pre-op nurse will coordinate an appropriate time for 1 support person to accompany patient in pre-op.  This support person may not rotate.    Please refer to the [name] website for the visitor guidelines for Inpatients (after your surgery is over and you are in a regular room).       Your procedure is scheduled on: 11/09/23   Report to [name] Main Entrance    Report to admitting at 7:45 AM   Call this number if you have problems the morning of surgery [phone]   Do not eat food :After Midnight.   After Midnight you may have the following liquids until 7:00 AM DAY OF SURGERY  Water  Non-Citrus Juices (without pulp, NO RED-Apple, White grape, White cranberry) Black Coffee (NO MILK/CREAM OR CREAMERS, sugar ok)  Clear Tea (NO MILK/CREAM OR CREAMERS, sugar ok) regular and decaf                             Plain Jell-O (NO RED)                                           Fruit ices (not with fruit pulp, NO RED)                                     Popsicles (NO RED)                                                               Sports drinks like Gatorade (NO RED)                   The day of surgery:  Drink ONE (1) Pre-Surgery Clear Ensure  at 7:00 AM the morning of surgery. Drink in one sitting. Do not sip.  This drink was given to you during your  hospital  pre-op appointment visit. Nothing else to drink after completing the  Pre-Surgery Clear Ensure .     Oral Hygiene is also important to reduce your risk of infection.                                    Remember - BRUSH YOUR TEETH THE MORNING OF SURGERY WITH YOUR REGULAR TOOTHPASTE   Stop all vitamins and herbal supplements 7 days before surgery.   Take these medicines  the morning of surgery with A SIP OF WATER : Montelukast (singulair ), tamoxifen              You may not have any metal on your body including hair pins, jewelry, and body piercing             Do not wear make-up, lotions, powders, perfumes/cologne, or deodorant  Do not wear nail polish including gel and S&S, artificial/acrylic nails, or any other type of covering on natural nails including finger and toenails. If you have artificial nails, gel coating, etc. that needs to be removed by a nail salon please have this removed prior to surgery or surgery may need to be canceled/ delayed if the surgeon/ anesthesia feels like they are unable to be safely monitored.   Do not shave  48 hours prior to surgery.    Do not bring valuables to the hospital. [name] IS NOT             RESPONSIBLE   FOR VALUABLES.   Contacts, glasses, dentures or bridgework may not be worn into surgery.  DO NOT BRING YOUR HOME MEDICATIONS TO THE HOSPITAL. PHARMACY WILL DISPENSE MEDICATIONS LISTED ON YOUR MEDICATION LIST TO YOU DURING YOUR ADMISSION IN THE HOSPITAL!    Patients discharged on the day of surgery will not be allowed to drive home.  Someone NEEDS to stay with you for the first 24 hours after anesthesia.   Special Instructions: Bring a copy of your healthcare power of attorney and living will documents the day of surgery if you haven't scanned them before.              Please read over the following fact sheets you were given: IF YOU HAVE QUESTIONS ABOUT YOUR PRE-OP INSTRUCTIONS PLEASE CALL [phone] [name]   If you received  a COVID test during your pre-op visit  it is requested that you wear a mask when out in public, stay away from anyone that may not be feeling well and notify your surgeon if you develop symptoms. If you test positive for Covid or have been in contact with anyone that has tested positive in the last 10 days please notify you surgeon.    [name] - Preparing for Surgery Before surgery, you can play an important role.  Because skin is not sterile, your skin needs to be as free of germs as possible.  You can reduce the number of germs on your skin by washing with CHG (chlorahexidine gluconate) soap before surgery.  CHG is an antiseptic cleaner which kills germs and bonds with the skin to continue killing germs even after washing. Please DO NOT use if you have an allergy to CHG or antibacterial soaps.  If your skin becomes reddened/irritated stop using the CHG and inform your nurse when you arrive at Short Stay. Do not shave (including legs and underarms) for at least 48 hours prior to the first CHG shower.  You may shave your face/neck.  Please follow these instructions carefully:  1.  Shower with CHG Soap the night before surgery and the  morning of surgery.  2.  If you choose to wash your hair, wash your hair first as usual with your normal  shampoo.  3.  After you shampoo, rinse your hair and body thoroughly to remove the shampoo.                             4.  Use CHG  as you would any other liquid soap.  You can apply chg directly to the skin and wash.  Gently with a scrungie or clean washcloth.  5.  Apply the CHG Soap to your body ONLY FROM THE NECK DOWN.   Do   not use on face/ open                           Wound or open sores. Avoid contact with eyes, ears mouth and   genitals (private parts).                       Wash face,  Genitals (private parts) with your normal soap.             6.  Wash thoroughly, paying special attention to the area where your    surgery  will be performed.  7.   Thoroughly rinse your body with warm water  from the neck down.  8.  DO NOT shower/wash with your normal soap after using and rinsing off the CHG Soap.                9.  Pat yourself dry with a clean towel.            10.  Wear clean pajamas.            11.  Place clean sheets on your bed the night of your first shower and do not  sleep with pets. Day of Surgery : Do not apply any lotions/deodorants the morning of surgery.  Please wear clean clothes to the hospital/[name].  FAILURE TO FOLLOW THESE INSTRUCTIONS MAY RESULT IN THE CANCELLATION OF YOUR SURGERY  PATIENT SIGNATURE_________________________________  NURSE SIGNATURE__________________________________  ________________________________________________________________________

## 2023-11-02 NOTE — Progress Notes (Signed)
 Surgery orders requested via Epic inbox.

## 2023-11-02 NOTE — Progress Notes (Signed)
 Request sent to Dr. Dorrie Gaudier to send preop orders for PST visit.

## 2023-11-03 ENCOUNTER — Ambulatory Visit: Payer: Self-pay | Admitting: General Surgery

## 2023-11-04 ENCOUNTER — Other Ambulatory Visit: Payer: Self-pay

## 2023-11-04 ENCOUNTER — Encounter (HOSPITAL_COMMUNITY)
Admission: RE | Admit: 2023-11-04 | Discharge: 2023-11-04 | Disposition: A | Discharge status: Home / Self Care | Source: Ambulatory Visit | Attending: General Surgery | Admitting: General Surgery

## 2023-11-04 ENCOUNTER — Encounter (HOSPITAL_COMMUNITY): Payer: Self-pay

## 2023-11-04 VITALS — BP 139/89 | HR 70 | Temp 98.2°F | Resp 16 | Ht 60.0 in | Wt 182.0 lb

## 2023-11-04 DIAGNOSIS — Z01812 Encounter for preprocedural laboratory examination: Secondary | ICD-10-CM | POA: Insufficient documentation

## 2023-11-04 DIAGNOSIS — I10 Essential (primary) hypertension: Secondary | ICD-10-CM | POA: Diagnosis not present

## 2023-11-04 HISTORY — DX: Anxiety disorder, unspecified: F41.9

## 2023-11-04 LAB — CBC
HCT: 41.4 % (ref 36.0–46.0)
Hemoglobin: 13.2 g/dL (ref 12.0–15.0)
MCH: 30.4 pg (ref 26.0–34.0)
MCHC: 31.9 g/dL (ref 30.0–36.0)
MCV: 95.4 fL (ref 80.0–100.0)
Platelets: 298 10*3/uL (ref 150–400)
RBC: 4.34 MIL/uL (ref 3.87–5.11)
RDW: 12.8 % (ref 11.5–15.5)
WBC: 6.8 10*3/uL (ref 4.0–10.5)
nRBC: 0 % (ref 0.0–0.2)

## 2023-11-04 LAB — BASIC METABOLIC PANEL WITH GFR
Anion gap: 7 (ref 5–15)
BUN: 19 mg/dL (ref 8–23)
CO2: 26 mmol/L (ref 22–32)
Calcium: 9.3 mg/dL (ref 8.9–10.3)
Chloride: 108 mmol/L (ref 98–111)
Creatinine, Ser: 0.99 mg/dL (ref 0.44–1.00)
GFR, Estimated: >60 mL/min (ref >60)
Glucose, Bld: 91 mg/dL (ref 70–99)
Potassium: 4.3 mmol/L (ref 3.5–5.1)
Sodium: 141 mmol/L (ref 135–145)

## 2023-11-09 ENCOUNTER — Ambulatory Visit (HOSPITAL_COMMUNITY): Payer: Self-pay | Admitting: Anesthesiology

## 2023-11-09 ENCOUNTER — Encounter (HOSPITAL_COMMUNITY): Payer: Self-pay | Admitting: General Surgery

## 2023-11-09 ENCOUNTER — Ambulatory Visit (HOSPITAL_BASED_OUTPATIENT_CLINIC_OR_DEPARTMENT_OTHER): Payer: Self-pay | Admitting: Anesthesiology

## 2023-11-09 ENCOUNTER — Other Ambulatory Visit: Payer: Self-pay

## 2023-11-09 ENCOUNTER — Encounter (HOSPITAL_COMMUNITY)
Admission: RE | Disposition: A | Discharge status: Home / Self Care | Payer: Self-pay | Source: Home / Self Care | Attending: General Surgery

## 2023-11-09 ENCOUNTER — Ambulatory Visit (HOSPITAL_COMMUNITY)
Admission: RE | Admit: 2023-11-09 | Discharge: 2023-11-09 | Disposition: A | Discharge status: Home / Self Care | Attending: General Surgery | Admitting: General Surgery

## 2023-11-09 DIAGNOSIS — Z6836 Body mass index (BMI) 36.0-36.9, adult: Secondary | ICD-10-CM | POA: Diagnosis not present

## 2023-11-09 DIAGNOSIS — K9509 Other complications of gastric band procedure: Secondary | ICD-10-CM | POA: Diagnosis present

## 2023-11-09 DIAGNOSIS — Y732 Prosthetic and other implants, materials and accessory gastroenterology and urology devices associated with adverse incidents: Secondary | ICD-10-CM | POA: Insufficient documentation

## 2023-11-09 DIAGNOSIS — I10 Essential (primary) hypertension: Secondary | ICD-10-CM | POA: Diagnosis not present

## 2023-11-09 DIAGNOSIS — E66812 Obesity, class 2: Secondary | ICD-10-CM

## 2023-11-09 DIAGNOSIS — Z6839 Body mass index (BMI) 39.0-39.9, adult: Secondary | ICD-10-CM | POA: Insufficient documentation

## 2023-11-09 DIAGNOSIS — G4733 Obstructive sleep apnea (adult) (pediatric): Secondary | ICD-10-CM | POA: Insufficient documentation

## 2023-11-09 DIAGNOSIS — Z9884 Bariatric surgery status: Secondary | ICD-10-CM | POA: Insufficient documentation

## 2023-11-09 DIAGNOSIS — J45909 Unspecified asthma, uncomplicated: Secondary | ICD-10-CM | POA: Diagnosis not present

## 2023-11-09 SURGERY — REMOVAL, GASTRIC BAND, LAPAROSCOPIC
Anesthesia: General

## 2023-11-09 MED ORDER — DEXAMETHASONE SODIUM PHOSPHATE 10 MG/ML IJ SOLN
INTRAMUSCULAR | Status: AC
  Filled 2023-11-09: qty 1

## 2023-11-09 MED ORDER — CHLORHEXIDINE GLUCONATE 0.12 % MT SOLN
15.0000 mL | Freq: Once | OROMUCOSAL | Status: AC
  Administered 2023-11-09: 15 mL via OROMUCOSAL

## 2023-11-09 MED ORDER — ACETAMINOPHEN 500 MG PO TABS
1000.0000 mg | ORAL_TABLET | ORAL | Status: AC
Start: 2023-11-09 — End: 2023-11-09
  Administered 2023-11-09: 1000 mg via ORAL
  Filled 2023-11-09: qty 2

## 2023-11-09 MED ORDER — ROCURONIUM BROMIDE 10 MG/ML (PF) SYRINGE
PREFILLED_SYRINGE | INTRAVENOUS | Status: AC
  Filled 2023-11-09: qty 10

## 2023-11-09 MED ORDER — ORAL CARE MOUTH RINSE: 15.0000 mL | Freq: Once | OROMUCOSAL | Status: AC

## 2023-11-09 MED ORDER — ACETAMINOPHEN 500 MG PO TABS
1000.0000 mg | ORAL_TABLET | Freq: Three times a day (TID) | ORAL | Status: AC
Start: 2023-11-09 — End: 2023-11-14

## 2023-11-09 MED ORDER — CELECOXIB 200 MG PO CAPS
400.0000 mg | ORAL_CAPSULE | ORAL | Status: AC
  Administered 2023-11-09: 400 mg via ORAL
  Filled 2023-11-09: qty 2

## 2023-11-09 MED ORDER — FENTANYL CITRATE (PF) 100 MCG/2ML IJ SOLN
INTRAMUSCULAR | Status: AC
Start: 2023-11-09 — End: ?
  Filled 2023-11-09: qty 2

## 2023-11-09 MED ORDER — BUPIVACAINE-EPINEPHRINE 0.25% -1:200000 IJ SOLN
INTRAMUSCULAR | Status: DC | PRN
  Administered 2023-11-09: 30 mL

## 2023-11-09 MED ORDER — FENTANYL CITRATE (PF) 100 MCG/2ML IJ SOLN
INTRAMUSCULAR | Status: DC | PRN
  Administered 2023-11-09 (×2): 50 ug via INTRAVENOUS
  Administered 2023-11-09: 100 ug via INTRAVENOUS

## 2023-11-09 MED ORDER — ENSURE PRE-SURGERY PO LIQD: 296.0000 mL | Freq: Once | ORAL | Status: DC

## 2023-11-09 MED ORDER — MIDAZOLAM HCL 2 MG/2ML IJ SOLN
INTRAMUSCULAR | Status: AC
Start: 2023-11-09 — End: ?
  Filled 2023-11-09: qty 2

## 2023-11-09 MED ORDER — FENTANYL CITRATE PF 50 MCG/ML IJ SOSY
PREFILLED_SYRINGE | INTRAMUSCULAR | Status: AC
  Filled 2023-11-09: qty 2

## 2023-11-09 MED ORDER — ONDANSETRON 4 MG PO TBDP: 4.0000 mg | ORAL_TABLET | Freq: Four times a day (QID) | ORAL | 0 refills | Status: DC | PRN

## 2023-11-09 MED ORDER — ONDANSETRON HCL 4 MG/2ML IJ SOLN
INTRAMUSCULAR | Status: AC
  Filled 2023-11-09: qty 2

## 2023-11-09 MED ORDER — ROCURONIUM BROMIDE 100 MG/10ML IV SOLN
INTRAVENOUS | Status: DC | PRN
  Administered 2023-11-09: 60 mg via INTRAVENOUS

## 2023-11-09 MED ORDER — CHLORHEXIDINE GLUCONATE CLOTH 2 % EX PADS: 6.0000 | MEDICATED_PAD | Freq: Once | CUTANEOUS | Status: DC

## 2023-11-09 MED ORDER — PROPOFOL 10 MG/ML IV BOLUS
INTRAVENOUS | Status: DC | PRN
  Administered 2023-11-09: 160 mg via INTRAVENOUS

## 2023-11-09 MED ORDER — DEXAMETHASONE SODIUM PHOSPHATE 10 MG/ML IJ SOLN
INTRAMUSCULAR | Status: DC | PRN
  Administered 2023-11-09: 8 mg via INTRAVENOUS

## 2023-11-09 MED ORDER — LIDOCAINE HCL (CARDIAC) PF 100 MG/5ML IV SOSY
PREFILLED_SYRINGE | INTRAVENOUS | Status: DC | PRN
  Administered 2023-11-09: 100 mg via INTRAVENOUS

## 2023-11-09 MED ORDER — PHENYLEPHRINE 80 MCG/ML (10ML) SYRINGE FOR IV PUSH (FOR BLOOD PRESSURE SUPPORT)
PREFILLED_SYRINGE | INTRAVENOUS | Status: DC | PRN
  Administered 2023-11-09 (×2): 80 ug via INTRAVENOUS
  Administered 2023-11-09: 240 ug via INTRAVENOUS
  Administered 2023-11-09: 160 ug via INTRAVENOUS

## 2023-11-09 MED ORDER — CHLORHEXIDINE GLUCONATE CLOTH 2 % EX PADS
6.0000 | MEDICATED_PAD | Freq: Once | CUTANEOUS | Status: DC
Start: 2023-11-09 — End: 2023-11-09

## 2023-11-09 MED ORDER — BUPIVACAINE-EPINEPHRINE (PF) 0.25% -1:200000 IJ SOLN
INTRAMUSCULAR | Status: AC
  Filled 2023-11-09: qty 30

## 2023-11-09 MED ORDER — LACTATED RINGERS IV SOLN: INTRAVENOUS | Status: DC | PRN

## 2023-11-09 MED ORDER — ONDANSETRON HCL 4 MG/2ML IJ SOLN: 4.0000 mg | Freq: Once | INTRAMUSCULAR | Status: DC | PRN

## 2023-11-09 MED ORDER — OXYCODONE HCL 5 MG PO TABS: 5.0000 mg | ORAL_TABLET | Freq: Four times a day (QID) | ORAL | 0 refills | Status: DC | PRN

## 2023-11-09 MED ORDER — SUGAMMADEX SODIUM 200 MG/2ML IV SOLN
INTRAVENOUS | Status: DC | PRN
  Administered 2023-11-09: 200 mg via INTRAVENOUS

## 2023-11-09 MED ORDER — PROPOFOL 10 MG/ML IV BOLUS
INTRAVENOUS | Status: AC
  Filled 2023-11-09: qty 20

## 2023-11-09 MED ORDER — LACTATED RINGERS IV SOLN: INTRAVENOUS | Status: DC

## 2023-11-09 MED ORDER — CEFAZOLIN SODIUM-DEXTROSE 2-4 GM/100ML-% IV SOLN
2.0000 g | INTRAVENOUS | Status: AC
  Administered 2023-11-09: 2 g via INTRAVENOUS
  Filled 2023-11-09: qty 100

## 2023-11-09 MED ORDER — FENTANYL CITRATE PF 50 MCG/ML IJ SOSY
25.0000 ug | PREFILLED_SYRINGE | INTRAMUSCULAR | Status: DC | PRN
  Administered 2023-11-09 (×2): 50 ug via INTRAVENOUS

## 2023-11-09 MED ORDER — LIDOCAINE HCL (PF) 2 % IJ SOLN
INTRAMUSCULAR | Status: AC
  Filled 2023-11-09: qty 5

## 2023-11-09 MED ORDER — MIDAZOLAM HCL 5 MG/5ML IJ SOLN
INTRAMUSCULAR | Status: DC | PRN
  Administered 2023-11-09 (×2): 1 mg via INTRAVENOUS

## 2023-11-09 MED ORDER — ONDANSETRON HCL 4 MG/2ML IJ SOLN
INTRAMUSCULAR | Status: DC | PRN
  Administered 2023-11-09: 4 mg via INTRAVENOUS

## 2023-11-09 MED ORDER — FENTANYL CITRATE (PF) 100 MCG/2ML IJ SOLN
INTRAMUSCULAR | Status: AC
  Filled 2023-11-09: qty 2

## 2023-11-09 SURGICAL SUPPLY — 39 items
BAG COUNTER SPONGE SURGICOUNT (BAG) IMPLANT
BENZOIN TINCTURE PRP APPL 2/3 (GAUZE/BANDAGES/DRESSINGS) IMPLANT
BLADE SURG 15 STRL LF DISP TIS (BLADE) ×1 IMPLANT
BLADE SURG SZ11 CARB STEEL (BLADE) ×1 IMPLANT
BNDG ADH 1X3 SHEER STRL LF (GAUZE/BANDAGES/DRESSINGS) IMPLANT
CABLE HIGH FREQUENCY MONO STRZ (ELECTRODE) ×1 IMPLANT
CHLORAPREP W/TINT 26 (MISCELLANEOUS) ×1 IMPLANT
DERMABOND ADVANCED .7 DNX12 (GAUZE/BANDAGES/DRESSINGS) IMPLANT
DISSECTOR BLUNT TIP ENDO 5MM (MISCELLANEOUS) IMPLANT
DRAPE UTILITY XL STRL (DRAPES) ×1 IMPLANT
ELECT REM PT RETURN 15FT ADLT (MISCELLANEOUS) ×1 IMPLANT
GLOVE BIO SURGEON STRL SZ7.5 (GLOVE) ×1 IMPLANT
GLOVE BIOGEL PI IND STRL 7.0 (GLOVE) ×1 IMPLANT
GLOVE SURG SS PI 7.0 STRL IVOR (GLOVE) ×1 IMPLANT
GOWN STRL REUS W/ TWL XL LVL3 (GOWN DISPOSABLE) ×2 IMPLANT
GRASPER SUT TROCAR 14GX15 (MISCELLANEOUS) IMPLANT
IRRIGATION SUCT STRKRFLW 2 WTP (MISCELLANEOUS) IMPLANT
KIT BASIN OR (CUSTOM PROCEDURE TRAY) ×1 IMPLANT
KIT TURNOVER KIT A (KITS) IMPLANT
NDL SPNL 22GX3.5 QUINCKE BK (NEEDLE) ×1 IMPLANT
NEEDLE SPNL 22GX3.5 QUINCKE BK (NEEDLE) ×1 IMPLANT
NS IRRIG 1000ML POUR BTL (IV SOLUTION) ×1 IMPLANT
PACK UNIVERSAL I (CUSTOM PROCEDURE TRAY) ×1 IMPLANT
PENCIL ELECTRO RS 15 CORD (MISCELLANEOUS) ×1 IMPLANT
SET TUBE SMOKE EVAC HIGH FLOW (TUBING) ×1 IMPLANT
SHEARS HARMONIC 36 ACE (MISCELLANEOUS) IMPLANT
SLEEVE Z-THREAD 5X100MM (TROCAR) ×2 IMPLANT
SOLUTION ANTFG W/FOAM PAD STRL (MISCELLANEOUS) ×1 IMPLANT
SPIKE FLUID TRANSFER (MISCELLANEOUS) ×1 IMPLANT
STRIP CLOSURE SKIN 1/2X4 (GAUZE/BANDAGES/DRESSINGS) IMPLANT
SUT MNCRL AB 4-0 PS2 18 (SUTURE) ×1 IMPLANT
SUT VIC AB 2-0 SH 27X BRD (SUTURE) ×1 IMPLANT
SUT VICRYL 0 TIES 12 18 (SUTURE) IMPLANT
SUT VICRYL 0 UR6 27IN ABS (SUTURE) IMPLANT
SYR 20ML LL LF (SYRINGE) ×1 IMPLANT
SYSTEM KII OPTICAL ACCESS 15MM (TROCAR) IMPLANT
TOWEL OR 17X26 10 PK STRL BLUE (TOWEL DISPOSABLE) ×1 IMPLANT
TROCAR BALLN 12MMX100 BLUNT (TROCAR) ×1 IMPLANT
TROCAR Z-THREAD OPTICAL 5X100M (TROCAR) ×1 IMPLANT

## 2023-11-09 NOTE — H&P (Signed)
 RETURN WEIGHT LOSS - LTFU for est pt. Band placed 10/20/11   Subjective   [name] is a 68 y.o. female established patient in today for: History of Present Illness [name] is a 68 year old female who presents with complications related to her lap band. She was referred by her gastroenterologist for evaluation and management of lap band complications.  She experienced significant illness in December and March, leading to a 15-day hospitalization. Her gastroenterologist advised lap band removal due to these complications.  She has difficulty swallowing and a sensation of a mass in her throat for several months, with frequent coughing and intermittent hoarseness. These symptoms persist despite treatment.  She struggles to retain food and sometimes water , though not daily. Her lap band has not been adjusted since 2018, and she estimates up to five units of fluid in the port.  Patient Active Problem List  Diagnosis  Asthma (HHS-HCC)  Intraductal carcinoma in situ of breast  Legal blindness  Vitamin D  deficiency  OSA (obstructive sleep apnea)  OA (osteoarthritis) of knee  Essential hypertension, benign  Hyperlipidemia  Hearing loss in right ear  Ringing in right ear   Outpatient Medications Prior to Visit  Medication Sig Dispense Refill  hydroCHLOROthiazide  (HYDRODIURIL ) 12.5 MG tablet Take 12.5 mg by mouth once daily  montelukast  (SINGULAIR ) 10 mg tablet Take 1 tablet by mouth at bedtime  tamoxifen  (NOLVADEX ) 20 MG tablet Take 1 tablet by mouth once daily  albuterol  (PROVENTIL ) 2.5 mg /3 mL (0.083 %) nebulizer solution Take 2.5 mg by nebulization every 4 (four) hours as needed for Wheezing or Shortness of Breath (Patient not taking: Reported on 10/27/2023)  albuterol  90 mcg/actuation inhaler Inhale 2 inhalations into the lungs every 4 (four) hours as needed for Wheezing (cough) (Patient not taking: Reported on 10/27/2023)  budesonide -formoteroL  (SYMBICORT ) 160-4.5 mcg/actuation  inhaler Take 2 inhalations by mouth 2 (two) times daily (Patient not taking: Reported on 10/27/2023)  ergocalciferol , vitamin D2, 1,250 mcg (50,000 unit) capsule Take 50,000 Units by mouth every 7 (seven) days (Patient not taking: Reported on 10/27/2023)  lisinopriL  (ZESTRIL ) 30 MG tablet Take 1 tablet by mouth once daily (Patient not taking: Reported on 10/27/2023)  multivitamin with minerals tablet Take 1 tablet by mouth daily with breakfast (Patient not taking: Reported on 10/27/2023)   No facility-administered medications prior to visit.    Objective   Vitals:  10/27/23 0927  BP: 135/82  Pulse: 84  Temp: 36.1 C (97 F)  SpO2: 98%  Weight: 84.4 kg (186 lb)  Height: 152.4 cm (5')   Body mass index is 36.33 kg/m. Physical Exam Constitutional:  Appearance: Normal appearance.  HENT:  Head: Normocephalic and atraumatic.  Pulmonary:  Effort: Pulmonary effort is normal.  Musculoskeletal:  General: Normal range of motion.  Cervical back: Normal range of motion.  Neurological:  General: No focal deficit present.  Mental Status: She is alert and oriented to person, place, and time. Mental status is at baseline.  Psychiatric:  Mood and Affect: Mood normal.  Behavior: Behavior normal.  Thought Content: Thought content normal.     Assessment/Plan:   Assessment & Plan Complications of gastric band She has persistent dysphagia, coughing, hoarseness, regurgitation, and foreign body sensation due to the gastric band. GI specialist recommended removal. Band not adjusted since 2018. Removal may improve symptoms but not guarantee complete resolution. Explained procedure risks and potential weight gain post-removal. - Schedule laparoscopic removal of the gastric band in June at [facility]. - Ensure she is aware  of the risks, including bleeding, infection, pain, and constipation. - Advise on the potential for weight gain post-removal and recommend revisiting dietary education. Diagnoses and  all orders for this visit:  OSA (obstructive sleep apnea)  Class 2 severe obesity with body mass index (BMI) of 35 to 39.9 with serious comorbidity (CMS/HHS-HCC)  Gastric banding status

## 2023-11-09 NOTE — Anesthesia Procedure Notes (Signed)
 Procedure Name: Intubation Date/Time: 11/09/2023 9:43 AM  Performed by: Josetta Niece, CRNAPre-anesthesia Checklist: Patient identified, Emergency Drugs available, Suction available and Patient being monitored Patient Re-evaluated:Patient Re-evaluated prior to induction Oxygen Delivery Method: Circle System Utilized Preoxygenation: Pre-oxygenation with 100% oxygen Induction Type: IV induction Ventilation: Mask ventilation without difficulty Laryngoscope Size: Mac and 3 Grade View: Grade II Tube type: Oral Tube size: 7.5 mm Number of attempts: 1 Airway Equipment and Method: Stylet and Oral airway Placement Confirmation: ETT inserted through vocal cords under direct vision, positive ETCO2 and breath sounds checked- equal and bilateral Secured at: 20 cm Tube secured with: Tape Dental Injury: Teeth and Oropharynx as per pre-operative assessment

## 2023-11-09 NOTE — Anesthesia Preprocedure Evaluation (Addendum)
 Anesthesia Evaluation  Patient identified by MRN, date of birth, ID band Patient awake    Reviewed: Allergy & Precautions, NPO status , Patient's Chart, lab work & pertinent test results  Airway Mallampati: III  TM Distance: >3 FB Neck ROM: Full    Dental  (+) Teeth Intact, Dental Advisory Given   Pulmonary asthma , sleep apnea and Continuous Positive Airway Pressure Ventilation , former smoker   Pulmonary exam normal breath sounds clear to auscultation       Cardiovascular hypertension, Pt. on medications Normal cardiovascular exam Rhythm:Regular Rate:Normal     Neuro/Psych  Headaches PSYCHIATRIC DISORDERS Anxiety     L eye blindness    GI/Hepatic Neg liver ROS,GERD  Medicated,,S/p gastric band    Endo/Other  negative endocrine ROS  Obesity   Renal/GU negative Renal ROS     Musculoskeletal  (+) Arthritis ,    Abdominal   Peds  Hematology negative hematology ROS (+)   Anesthesia Other Findings Day of surgery medications reviewed with the patient.  Reproductive/Obstetrics                             Anesthesia Physical Anesthesia Plan  ASA: 2  Anesthesia Plan: General   Post-op Pain Management: Tylenol  PO (pre-op)*   Induction: Intravenous  PONV Risk Score and Plan: 4 or greater and Midazolam , Dexamethasone  and Ondansetron   Airway Management Planned: Oral ETT  Additional Equipment:   Intra-op Plan:   Post-operative Plan: Extubation in OR  Informed Consent: I have reviewed the patients History and Physical, chart, labs and discussed the procedure including the risks, benefits and alternatives for the proposed anesthesia with the patient or authorized representative who has indicated his/her understanding and acceptance.     Dental advisory given  Plan Discussed with: CRNA  Anesthesia Plan Comments:         Anesthesia Quick Evaluation

## 2023-11-09 NOTE — Anesthesia Postprocedure Evaluation (Signed)
 Anesthesia Post Note  Patient: Rebecca [name]  Procedure(s) Performed: REMOVAL, GASTRIC BAND, LAPAROSCOPIC     Patient location during evaluation: PACU Anesthesia Type: General Level of consciousness: awake and alert Pain management: pain level controlled Vital Signs Assessment: post-procedure vital signs reviewed and stable Respiratory status: spontaneous breathing, nonlabored ventilation, respiratory function stable and patient connected to nasal cannula oxygen Cardiovascular status: blood pressure returned to baseline and stable Postop Assessment: no apparent nausea or vomiting Anesthetic complications: no   No notable events documented.  Last Vitals:  Vitals:   11/09/23 1300 11/09/23 1315  BP: 128/77 (!) 140/86  Pulse: 74 74  Resp:    Temp:  (!) 36.2 C  SpO2: 91% 97%    Last Pain:  Vitals:   11/09/23 1315  TempSrc:   PainSc: 3                  Erin Havers

## 2023-11-09 NOTE — Transfer of Care (Signed)
 Immediate Anesthesia Transfer of Care Note  Patient: Rebecca [name]  Procedure(s) Performed: REMOVAL, GASTRIC BAND, LAPAROSCOPIC  Patient Location: PACU  Anesthesia Type:General  Level of Consciousness: awake and alert   Airway & Oxygen Therapy: Patient Spontanous Breathing and Patient connected to face mask oxygen  Post-op Assessment: Report given to RN and Post -op Vital signs reviewed and stable  Post vital signs: Reviewed and stable  Last Vitals:  Vitals Value Taken Time  BP 124/62 11/09/23 1040  Temp 36.6 C 11/09/23 1040  Pulse 88 11/09/23 1041  Resp 20 11/09/23 1041  SpO2 100 % 11/09/23 1041  Vitals shown include unfiled device data.  Last Pain:  Vitals:   11/09/23 0838  TempSrc:   PainSc: 4       Patients Stated Pain Goal: 5 (11/09/23 0830)  Complications: No notable events documented.

## 2023-11-09 NOTE — Op Note (Signed)
 Preop Diagnosis: band complication of dysphagia  Postop Diagnosis: same  Procedure performed: laparoscopic gastric band removal, subcutaneous port removal  Assitant: Aldon Hung  Indications:  The patient with history of gastric adjustable band insertion for weight loss presents with dysphagia and malaise.  Description of Operation:  Following informed consent, the patient was taken to the operating room and placed on the operating table in the supine position.  She had previously received prophylactic antibiotics.  After induction of general endotracheal anesthesia by the anesthesiologist, the patient underwent placement of sequential compression devices.  A timeout was confirmed by the surgery and anesthesia teams.  The patient was adequately padded at all pressure points and placed on a footboard to prevent slippage from the OR table during extremes of position during surgery.  She underwent a routine sterile prep and drape of her entire abdomen.    Next, A transverse incision was made under the left subcostal area and a 5mm optical viewing trocar was introduced into the peritoneal cavity. Pneumoperitoneum was applied with a high flow and low pressure. A laparoscope was inserted to confirm placement. A extraperitoneal block was then placed at the lateral abdominal wall using marcaine . 5 additional incisions were placed: 1 5mm trocar to the left of the midline, 1 15 mm trocar in the right mid abdomen, 1 5mm trocar in the right subcostal area, and a Nathanson retractor was placed through a subxiphoid incision.  Cautery was used to dissect some adhesions of the omentum off the abdominal wall.  The buckle of the band was identified. Cautery was used to free adhesions from over the buckle and the buckle was unclipped and cut free and removed. Next, the band was slipped from around the stomach. The adhesions and cicatrix around the stomach were dissected free to allow distension of the abdomen.    The band and tubing were removed via the 15 mm trocar. The Nathanson retractor was removed.  Next, attention was turned to the subcutaneous port. The incision over the port was enlarged and cautery was used to remove the port away from surrounding tissues. The fascia was closed with 0 vicryl. The subcutaneous spaced was closed with 3-0 vicryl in interrupted fashion. All trocars were removed after pneumoperitoneum was evacuated. All skin incisions were closed with 4-0 monocryl by subcuticular technique.  Estimated blood loss: <29ml  Specimens:  Band for gross only  Local Anesthesia: 30 ml Marcaine   Post-Op Plan:       Pain Management: PO, prn      Antibiotics: Prophylactic      Anticoagulation: Prophylactic, Starting now      Post Op Studies/Consults: Not applicable      Intended Discharge: within 48h      Intended Outpatient Follow-Up: Two Week      Intended Outpatient Studies: Not Applicable      Other: Not Applicable  Alphonso Aschoff Ericha Whittingham

## 2023-12-05 ENCOUNTER — Encounter (HOSPITAL_COMMUNITY): Payer: Self-pay

## 2023-12-05 ENCOUNTER — Ambulatory Visit (HOSPITAL_COMMUNITY)
Admission: EM | Admit: 2023-12-05 | Discharge: 2023-12-05 | Disposition: A | Discharge status: Home / Self Care | Attending: Emergency Medicine | Admitting: Emergency Medicine

## 2023-12-05 DIAGNOSIS — T8130XA Disruption of wound, unspecified, initial encounter: Secondary | ICD-10-CM | POA: Diagnosis not present

## 2023-12-05 DIAGNOSIS — T8149XA Infection following a procedure, other surgical site, initial encounter: Secondary | ICD-10-CM | POA: Diagnosis not present

## 2023-12-05 MED ORDER — CEPHALEXIN 500 MG PO CAPS: 500.0000 mg | ORAL_CAPSULE | Freq: Four times a day (QID) | ORAL | 0 refills | Status: AC

## 2023-12-05 NOTE — ED Provider Notes (Signed)
 [facility]    CSN: [id number] Arrival date & time: 12/05/23  1758      History   Chief Complaint Chief Complaint  Patient presents with   Post-op Problem    HPI Rebecca [name] is a 68 y.o. female.   Patient presents with concerns related to an incision to her abdomen from a surgery that occurred 3 weeks ago.  On 5/27 patient had surgery to remove a gastric band.  Patient states that this morning she noticed that the incision had opened slightly and began to bleed.  Patient states that she did not think anything of this but then began to notice some purulent drainage from the opening as well.  Patient denies fever, bodies, chills, weakness, and confusion.  Patient states that she plans on following up with her surgeon sooner than previously scheduled to evaluate this, but wanted to be seen sooner due to it being the weekend and being unable to see them for this today.  The history is provided by the patient and medical records.    Past Medical History:  Diagnosis Date   Anxiety    Arthritis    Asthma    Blindness, legal    L EYE   Breast cancer (HCC)    GERD (gastroesophageal reflux disease)    Hearing loss    Hyperlipidemia    Hypertension    Lapband May 2013 11/18/2011   Migraines    Obesity, Class III, BMI 40-49.9 (morbid obesity) 06/03/2011   Pre-diabetes    Sleep apnea    uses c-pap   Vertigo     Patient Active Problem List   Diagnosis Date Noted   History of laparoscopic adjustable gastric banding 10/27/2023   History of non-ST elevation myocardial infarction (NSTEMI) 10/27/2023   Pharyngoesophageal dysphagia 10/27/2023   GERD (gastroesophageal reflux disease) 06/18/2023   Acute asthma exacerbation 06/15/2023   Asthma exacerbation 06/14/2023   Abnormal CT of the chest 06/04/2023   Failed total knee arthroplasty (HCC) 12/28/2022   Failed total right knee replacement (HCC) 12/28/2022   Genetic testing 04/01/2022   Ductal carcinoma in situ of  breast 03/26/2022   OA (osteoarthritis) of knee 08/18/2021   S/P total knee arthroplasty, right 08/18/2021   Prediabetes 12/29/2019   Vitamin D  deficiency 12/29/2019   Class 2 obesity due to excess calories with serious comorbidity and body mass index (BMI) of 38.0 to 38.9 in adult 08/31/2016   Hyperlipidemia 05/04/2016   Essential hypertension 12/26/2012   Asthma 12/26/2012   OSA (obstructive sleep apnea) 10/05/2011    Past Surgical History:  Procedure Laterality Date   ABDOMINAL HYSTERECTOMY  2006   fibroids   BREAST LUMPECTOMY WITH RADIOACTIVE SEED LOCALIZATION Left 04/21/2022   Procedure: LEFT BREAST LUMPECTOMY WITH RADIOACTIVE SEED LOCALIZATION;  Surgeon: Belinda Cough, MD;  Location: [facility];  Service: General;  Laterality: Left;   BREATH TEK H PYLORI  08/17/2011   Procedure: BREATH TEK H PYLORI;  Surgeon: Cough KATHEE Lunger, MD;  Location: [facility];  Service: General;  Laterality: N/A;  PATIENT WILL COME AT 0715   COLONOSCOPY  2008   @ [facility]    EYE SURGERY     Patient unsure of surgery date. Left eye   KNEE SURGERY Right 1992   right knee arthroscopy   LAPAROSCOPIC GASTRIC BANDING  10/20/2011   Procedure: LAPAROSCOPIC GASTRIC BANDING;  Surgeon: Cough KATHEE Lunger, MD;  Location: [facility];  Service: General;  Laterality: N/A;   RE-EXCISION OF BREAST  LUMPECTOMY Left 05/05/2022   Procedure: RE-EXCISION OF LEFT BREAST LUMPECTOMY SITE POSTERIOR MARGIN;  Surgeon: Belinda Cough, MD;  Location: [facility];  Service: General;  Laterality: Left;   TOTAL KNEE ARTHROPLASTY Right 08/18/2021   Procedure: TOTAL KNEE ARTHROPLASTY;  Surgeon: Melodi Lerner, MD;  Location: [facility];  Service: Orthopedics;  Laterality: Right;   TOTAL KNEE REVISION Right 12/28/2022   Procedure: Right knee femoral versus total knee arthroplasty revision;  Surgeon: Melodi Lerner, MD;  Location: [facility];  Service: Orthopedics;  Laterality: Right;    OB History   No obstetric history  on file.      Home Medications    Prior to Admission medications   Medication Sig Start Date End Date Taking? Authorizing Provider  cephALEXin (KEFLEX) 500 MG capsule Take 1 capsule (500 mg total) by mouth 4 (four) times daily for 7 days. 12/05/23 12/12/23 Yes Johnie, Alivia Cimino A, NP  albuterol  (PROVENTIL ) (2.5 MG/3ML) 0.083% nebulizer solution Take 3 mLs (2.5 mg total) by nebulization every 4 (four) hours as needed for wheezing or shortness of breath. 06/04/23   Jeneal Danita Macintosh, MD  albuterol  (VENTOLIN  HFA) 108 [phone] Base) MCG/ACT inhaler Use 2 puffs every 4 hours as needed for cough or wheeze.  May use  2 puffs 10-20 minutes prior to exercise. 06/02/23   Cheryl Reusing, FNP  arformoterol  (BROVANA ) 15 MCG/2ML NEBU Inhale 2 mLs (15 mcg total) by nebulization 2 (two) times daily. Patient taking differently: Take 15 mcg by nebulization 2 (two) times daily as needed (shortness of breath). 06/23/23   Rai, Ripudeep K, MD  azelastine  (ASTELIN ) 0.1 % nasal spray Place 2 sprays into both nostrils 2 (two) times daily. Use in each nostril as directed Patient taking differently: Place 2 sprays into both nostrils 2 (two) times daily as needed for rhinitis. Use in each nostril as directed 06/23/23   Rai, Ripudeep K, MD  budesonide  (PULMICORT ) 0.25 MG/2ML nebulizer solution Take 2 mLs (0.25 mg total) by nebulization 2 (two) times daily. generic Patient not taking: Reported on 10/29/2023 06/23/23   Rai, Nydia POUR, MD  gabapentin  (NEURONTIN ) 100 MG capsule Take 1 capsule (100 mg total) by mouth 3 (three) times daily. Patient not taking: Reported on 10/29/2023 06/23/23   Rai, Nydia POUR, MD  hydrochlorothiazide  (HYDRODIURIL ) 12.5 MG tablet Take 1 tablet (12.5 mg total) by mouth daily. 04/21/23   [name], MD  hydrOXYzine  (ATARAX ) 25 MG tablet Take 1 tablet (25 mg total) by mouth 2 (two) times daily as needed for anxiety. 06/23/23   Rai, Ripudeep POUR, MD  ibuprofen (ADVIL) 800 MG tablet Take 800 mg by mouth 2  (two) times daily as needed (for pain). 04/06/23   [provider]  montelukast  (SINGULAIR ) 10 MG tablet Take 1 tablet (10 mg total) by mouth at bedtime. 06/02/23   Cheryl Reusing, FNP  pantoprazole  (PROTONIX ) 40 MG tablet Take 1 tablet (40 mg total) by mouth 2 (two) times daily before a meal. 06/23/23   Rai, Ripudeep K, MD  potassium chloride  SA (KLOR-CON  M) 20 MEQ tablet Take 1 tablet (20 mEq total) by mouth daily. 04/22/23   [name], MD  PRESCRIPTION MEDICATION See admin instructions. CPAP- At bedtime    [provider]  sodium chloride  HYPERTONIC 3 % nebulizer solution Take 4 mLs by nebulization as needed for cough. 09/17/23   [provider]  tamoxifen  (NOLVADEX ) 20 MG tablet TAKE 1 TABLET BY MOUTH EVERY DAY 11/02/23   Causey, Morna Pickle, NP  Family History Family History  Problem Relation Age of Onset   Breast cancer Mother 36   Alcohol abuse Father    Heart disease Father 88       MI   Hypertension Father    Colon cancer Brother        dx after 61   Breast cancer Daughter 73    Social History Social History   Tobacco Use   Smoking status: Former    Current packs/day: 0.00    Average packs/day: 0.5 packs/day for 15.0 years (7.5 ttl pk-yrs)    Types: Cigarettes    Start date: 06/03/1979    Quit date: 06/02/1994    Years since quitting: 29.5    Passive exposure: Never   Smokeless tobacco: Never  Vaping Use   Vaping status: Never Used  Substance Use Topics   Alcohol use: No   Drug use: No     Allergies   Hydrocodone, Atorvastatin , Lisinopril , and Sulfa antibiotics   Review of Systems Review of Systems  Per HPI  Physical Exam Triage Vital Signs ED Triage Vitals [12/05/23 1811]  Encounter Vitals Group     BP (!) 140/74     Girls Systolic BP Percentile      Girls Diastolic BP Percentile      Boys Systolic BP Percentile      Boys Diastolic BP Percentile      Pulse Rate 86     Resp 16     Temp 99.3 F (37.4 C)      Temp Source Oral     SpO2 96 %     Weight      Height      Head Circumference      Peak Flow      Pain Score 4     Pain Loc      Pain Education      Exclude from Growth Chart    No data found.  Updated Vital Signs BP (!) 140/74 (BP Location: Left Arm)   Pulse 86   Temp 99.3 F (37.4 C) (Oral)   Resp 16   SpO2 96%   Visual Acuity Right Eye Distance:   Left Eye Distance:   Bilateral Distance:    Right Eye Near:   Left Eye Near:    Bilateral Near:     Physical Exam Vitals and nursing note reviewed.  Constitutional:      General: She is awake. She is not in acute distress.    Appearance: Normal appearance. She is well-developed and well-groomed. She is not ill-appearing.   Skin:    General: Skin is warm and dry.     Findings: Wound present.         Comments: Incision dehiscence noted to the right lower abdomen.  There is an approximately 1 cm in diameter hole noted to her right lower abdomen with bloody and purulent drainage noted.  There is no surrounding erythema or swelling at this time.   Neurological:     Mental Status: She is alert.   Psychiatric:        Behavior: Behavior is cooperative.      UC Treatments / Results  Labs (all labs ordered are listed, but only abnormal results are displayed) Labs Reviewed - No data to display  EKG   Radiology No results found.  Procedures Procedures (including critical care time)  Medications Ordered in UC Medications - No data to display  Initial Impression / Assessment and Plan / UC Course  I have reviewed the triage vital signs and the nursing notes.  Pertinent labs & imaging results that were available during my care of the patient were reviewed by me and considered in my medical decision making (see chart for details).     Patient is overall well-appearing.  Vitals are stable.  Upon assessment incision dehiscence noted to the right lower abdomen.  There is an approximately 1 cm in diameter hole noted  to the right lower abdomen with bloody and purulent drainage.  There is no surrounding erythema or swelling at this time.  Prescribed cephalexin for wound infection coverage.  Discussed proper wound care at home.  Discussed follow-up, return, and strict ER precautions. Final Clinical Impressions(s) / UC Diagnoses   Final diagnoses:  Wound dehiscence  Postoperative wound infection     Discharge Instructions      Start taking cephalexin 4 times daily for 7 days for wound infection coverage. Keep the area clean dry and covered. If you notice increased purulent drainage, spreading of redness, swelling, fever,  weakness, or confusion please seek immediate medical treatment in the emergency department. Otherwise follow-up with your surgeon for further evaluation of this.    ED Prescriptions     Medication Sig Dispense Auth. Provider   cephALEXin (KEFLEX) 500 MG capsule Take 1 capsule (500 mg total) by mouth 4 (four) times daily for 7 days. 28 capsule Johnie Flaming A, NP      PDMP not reviewed this encounter.   Johnie Flaming A, NP 12/05/23 [phone]

## 2023-12-05 NOTE — Discharge Instructions (Signed)
 Start taking cephalexin 4 times daily for 7 days for wound infection coverage. Keep the area clean dry and covered. If you notice increased purulent drainage, spreading of redness, swelling, fever,  weakness, or confusion please seek immediate medical treatment in the emergency department. Otherwise follow-up with your surgeon for further evaluation of this.

## 2023-12-05 NOTE — ED Triage Notes (Signed)
 Patient here today due to an incision from a surgery still draining on her abd. after 3 weeks. Patient states that the area is aching.

## 2023-12-10 ENCOUNTER — Other Ambulatory Visit (HOSPITAL_COMMUNITY): Payer: Self-pay | Admitting: Orthopedic Surgery

## 2023-12-10 DIAGNOSIS — Z96659 Presence of unspecified artificial knee joint: Secondary | ICD-10-CM

## 2023-12-14 ENCOUNTER — Inpatient Hospital Stay

## 2023-12-14 ENCOUNTER — Inpatient Hospital Stay: Attending: Adult Health | Admitting: Adult Health

## 2023-12-16 ENCOUNTER — Encounter (HOSPITAL_COMMUNITY)
Admission: RE | Admit: 2023-12-16 | Discharge: 2023-12-16 | Disposition: A | Discharge status: Home / Self Care | Source: Ambulatory Visit | Attending: Orthopedic Surgery | Admitting: Orthopedic Surgery

## 2023-12-16 DIAGNOSIS — Z96659 Presence of unspecified artificial knee joint: Secondary | ICD-10-CM | POA: Diagnosis present

## 2023-12-16 MED ORDER — TECHNETIUM TC 99M MEDRONATE IV KIT
21.0000 | PACK | Freq: Once | INTRAVENOUS | Status: AC | PRN
  Administered 2023-12-16: 21 via INTRAVENOUS

## 2023-12-21 ENCOUNTER — Ambulatory Visit: Admitting: Family Medicine

## 2023-12-22 ENCOUNTER — Encounter: Admitting: Family Medicine

## 2023-12-31 ENCOUNTER — Encounter: Payer: Self-pay | Admitting: Family Medicine

## 2023-12-31 ENCOUNTER — Ambulatory Visit: Admitting: Family Medicine

## 2023-12-31 VITALS — BP 128/82 | HR 64 | Temp 98.7°F | Ht 60.0 in | Wt 195.4 lb

## 2023-12-31 DIAGNOSIS — Z0000 Encounter for general adult medical examination without abnormal findings: Secondary | ICD-10-CM

## 2023-12-31 DIAGNOSIS — Z96651 Presence of right artificial knee joint: Secondary | ICD-10-CM | POA: Diagnosis not present

## 2023-12-31 DIAGNOSIS — M25561 Pain in right knee: Secondary | ICD-10-CM

## 2023-12-31 DIAGNOSIS — G8929 Other chronic pain: Secondary | ICD-10-CM

## 2023-12-31 DIAGNOSIS — Z13820 Encounter for screening for osteoporosis: Secondary | ICD-10-CM

## 2023-12-31 DIAGNOSIS — R350 Frequency of micturition: Secondary | ICD-10-CM

## 2023-12-31 DIAGNOSIS — J4540 Moderate persistent asthma, uncomplicated: Secondary | ICD-10-CM

## 2023-12-31 DIAGNOSIS — H548 Legal blindness, as defined in USA: Secondary | ICD-10-CM

## 2023-12-31 LAB — POCT URINALYSIS DIPSTICK
Bilirubin, UA: NEGATIVE
Blood, UA: NEGATIVE
Glucose, UA: NEGATIVE
Ketones, UA: NEGATIVE
Leukocytes, UA: NEGATIVE
Nitrite, UA: NEGATIVE
Protein, UA: NEGATIVE
Spec Grav, UA: 1.02 (ref 1.010–1.025)
Urobilinogen, UA: 0.2 U/dL
pH, UA: 6 (ref 5.0–8.0)

## 2023-12-31 LAB — POCT GLYCOSYLATED HEMOGLOBIN (HGB A1C): Hemoglobin A1C: 5.5 % (ref 4.0–5.6)

## 2023-12-31 NOTE — Progress Notes (Signed)
 Subjective:   Rebecca [name] is a 68 y.o. female who presents for Medicare Annual (Subsequent) preventive examination.  Visit Complete: In person  Patient Medicare AWV questionnaire was completed by the patient on 12/31/23; I have confirmed that all information answered by patient is correct and no changes since this date.  Pt doing well overall but still having R knee pain s/p R TKR with revision.  Has appt with Ortho next wk.  Had a scan done but has not heard anything regarding results.  Knee was hurting during recent vacation.  Just returned from trip yesterday.  States people had to help her/walk with her as they thought she was unstable.  Asthma has been stable. Not sure if missed appt with Pulm.       Objective:    Today's Vitals   12/31/23 1334  BP: 128/82  Pulse: 64  Temp: 98.7 F (37.1 C)  TempSrc: Oral  Weight: 195 lb 6.4 oz (88.6 kg)  Height: 5' (1.524 m)  PainSc: 7    Body mass index is 38.16 kg/m.     11/09/2023    8:33 AM 11/04/2023   10:56 AM 06/14/2023   11:00 PM 06/09/2023   11:22 AM 06/02/2023    7:40 PM 12/28/2022    5:00 PM 12/22/2022    9:03 AM  Advanced Directives  Does Patient Have a Medical Advance Directive? Yes Yes No No No No No  Type of Advance Directive Living will;Healthcare Power of [employer] Power of [place];Living will       Does patient want to make changes to medical advance directive? No - Patient declined No - Patient declined       Copy of Healthcare Power of Attorney in Chart? No - copy requested        Would patient like information on creating a medical advance directive?   No - Patient declined  No - Patient declined No - Patient declined No - Patient declined    Current Medications (verified) Outpatient Encounter Medications as of 12/31/2023  Medication Sig   albuterol  (PROVENTIL ) (2.5 MG/3ML) 0.083% nebulizer solution Take 3 mLs (2.5 mg total) by nebulization every 4 (four) hours as needed for wheezing or shortness  of breath.   albuterol  (VENTOLIN  HFA) 108 (90 Base) MCG/ACT inhaler Use 2 puffs every 4 hours as needed for cough or wheeze.  May use  2 puffs 10-20 minutes prior to exercise.   arformoterol  (BROVANA ) 15 MCG/2ML NEBU Inhale 2 mLs (15 mcg total) by nebulization 2 (two) times daily. (Patient taking differently: Take 15 mcg by nebulization 2 (two) times daily as needed (shortness of breath).)   azelastine  (ASTELIN ) 0.1 % nasal spray Place 2 sprays into both nostrils 2 (two) times daily. Use in each nostril as directed (Patient taking differently: Place 2 sprays into both nostrils 2 (two) times daily as needed for rhinitis. Use in each nostril as directed)   hydrochlorothiazide  (HYDRODIURIL ) 12.5 MG tablet Take 1 tablet (12.5 mg total) by mouth daily.   ibuprofen (ADVIL) 800 MG tablet Take 800 mg by mouth 2 (two) times daily as needed (for pain).   montelukast  (SINGULAIR ) 10 MG tablet Take 1 tablet (10 mg total) by mouth at bedtime.   pantoprazole  (PROTONIX ) 40 MG tablet Take 1 tablet (40 mg total) by mouth 2 (two) times daily before a meal.   potassium chloride  SA (KLOR-CON  M) 20 MEQ tablet Take 1 tablet (20 mEq total) by mouth daily.   PRESCRIPTION MEDICATION See admin  instructions. CPAP- At bedtime   sodium chloride  HYPERTONIC 3 % nebulizer solution Take 4 mLs by nebulization as needed for cough.   tamoxifen  (NOLVADEX ) 20 MG tablet TAKE 1 TABLET BY MOUTH EVERY DAY   gabapentin  (NEURONTIN ) 100 MG capsule Take 1 capsule (100 mg total) by mouth 3 (three) times daily. (Patient not taking: Reported on 12/31/2023)   hydrOXYzine  (ATARAX ) 25 MG tablet Take 1 tablet (25 mg total) by mouth 2 (two) times daily as needed for anxiety. (Patient not taking: Reported on 12/31/2023)   [DISCONTINUED] budesonide  (PULMICORT ) 0.25 MG/2ML nebulizer solution Take 2 mLs (0.25 mg total) by nebulization 2 (two) times daily. generic (Patient not taking: Reported on 10/29/2023)   No facility-administered encounter medications on  file as of 12/31/2023.    Allergies (verified) Hydrocodone, Atorvastatin , Lisinopril , and Sulfa antibiotics   History: Past Medical History:  Diagnosis Date   Anxiety    Arthritis    Asthma    Blindness, legal    L EYE   Breast cancer (HCC)    GERD (gastroesophageal reflux disease)    Hearing loss    Hyperlipidemia    Hypertension    Lapband May 2013 11/18/2011   Migraines    Obesity, Class III, BMI 40-49.9 (morbid obesity) 06/03/2011   Pre-diabetes    Sleep apnea    uses c-pap   Vertigo    Past Surgical History:  Procedure Laterality Date   ABDOMINAL HYSTERECTOMY  2006   fibroids   BREAST LUMPECTOMY WITH RADIOACTIVE SEED LOCALIZATION Left 04/21/2022   Procedure: LEFT BREAST LUMPECTOMY WITH RADIOACTIVE SEED LOCALIZATION;  Surgeon: Belinda Cough, MD;  Location: [facility];  Service: General;  Laterality: Left;   BREATH TEK H PYLORI  08/17/2011   Procedure: BREATH TEK H PYLORI;  Surgeon: Cough KATHEE Lunger, MD;  Location: [facility];  Service: General;  Laterality: N/A;  PATIENT WILL COME AT 0715   COLONOSCOPY  2008   @ [facility]    EYE SURGERY     Patient unsure of surgery date. Left eye   KNEE SURGERY Right 1992   right knee arthroscopy   LAPAROSCOPIC GASTRIC BANDING  10/20/2011   Procedure: LAPAROSCOPIC GASTRIC BANDING;  Surgeon: Cough KATHEE Lunger, MD;  Location: [facility];  Service: General;  Laterality: N/A;   RE-EXCISION OF BREAST LUMPECTOMY Left 05/05/2022   Procedure: RE-EXCISION OF LEFT BREAST LUMPECTOMY SITE POSTERIOR MARGIN;  Surgeon: Belinda Cough, MD;  Location: [facility];  Service: General;  Laterality: Left;   TOTAL KNEE ARTHROPLASTY Right 08/18/2021   Procedure: TOTAL KNEE ARTHROPLASTY;  Surgeon: Melodi Lerner, MD;  Location: [facility];  Service: Orthopedics;  Laterality: Right;   TOTAL KNEE REVISION Right 12/28/2022   Procedure: Right knee femoral versus total knee arthroplasty revision;  Surgeon: Melodi Lerner, MD;  Location: [facility];  Service: Orthopedics;  Laterality: Right;   Family History  Problem Relation Age of Onset   Breast cancer Mother 15   Alcohol abuse Father    Heart disease Father 82       MI   Hypertension Father    Colon cancer Brother        dx after 22   Breast cancer Daughter 30   Social History   Socioeconomic History   Marital status: Married    Spouse name: Not on file   Number of children: Not on file   Years of education: Not on file   Highest education level: Not on file  Occupational History  Not on file  Tobacco Use   Smoking status: Former    Current packs/day: 0.00    Average packs/day: 0.5 packs/day for 15.0 years (7.5 ttl pk-yrs)    Types: Cigarettes    Start date: 06/03/1979    Quit date: 06/02/1994    Years since quitting: 29.6    Passive exposure: Never   Smokeless tobacco: Never  Vaping Use   Vaping status: Never Used  Substance and Sexual Activity   Alcohol use: No   Drug use: No   Sexual activity: Not Currently    Birth control/protection: Surgical    Comment: hyst  Other Topics Concern   Not on file  Social History Narrative   Work or School: works part time with [employer] - 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       Home Situation: lives with husband      Spiritual Beliefs: Christian  - Gaffer, Higher education careers adviser      Lifestyle: wt watchers 2019, starting to exercise         Social Drivers of Health   Financial Resource Strain: Low Risk  (12/31/2023)   Overall Financial Resource Strain (CARDIA)    Difficulty of Paying Living Expenses: Not very hard  Food Insecurity: No Food Insecurity (12/31/2023)   Hunger Vital Sign    Worried About Running Out of Food in the Last Year: Never true    Ran Out of Food in the Last Year: Never true  Transportation Needs: No Transportation Needs (12/31/2023)   PRAPARE - Administrator, Civil Service (Medical): No    Lack of Transportation (Non-Medical): No  Physical Activity:  Insufficiently Active (12/31/2023)   Exercise Vital Sign    Days of Exercise per Week: 2 days    Minutes of Exercise per Session: 20 min  Stress: Stress Concern Present (12/31/2023)   [employer] of Occupational Health - Occupational Stress Questionnaire    Feeling of Stress: To some extent  Social Connections: Moderately Integrated (12/31/2023)   Social Connection and Isolation Panel    Frequency of Communication with Friends and Family: More than three times a week    Frequency of Social Gatherings with Friends and Family: Once a week    Attends Religious Services: More than 4 times per year    Active Member of [employer] or Organizations: No    Attends Engineer, structural: Patient unable to answer    Marital Status: Married    Tobacco Counseling Counseling given: Not Answered   Clinical Intake:  Pre-visit preparation completed: No  Pain : 0-10 Pain Score: 7  Pain Location: Knee Pain Orientation: Right     Diabetes: No  How often do you need to have someone help you when you read instructions, pamphlets, or other written materials from your doctor or pharmacy?: 3 - Sometimes  Interpreter Needed?: No      Activities of Daily Living    12/31/2023    1:35 PM 11/04/2023   10:58 AM  In your present state of health, do you have any difficulty performing the following activities:  Hearing? 1   Vision? 1   Difficulty concentrating or making decisions? 0   Walking or climbing stairs? 1   Dressing or bathing? 0   Doing errands, shopping? 0 0  Preparing Food and eating ? N   Using the Toilet? N   In the past six months, have you accidently leaked urine? Y   Do you have problems  with loss of bowel control? N   Managing your Medications? N   Managing your Finances? N   Housekeeping or managing your Housekeeping? N     Patient Care Team: [name], MD as PCP - General (Family Medicine) Johnnye Ade, MD as Attending Physician (Obstetrics and  Gynecology) Corrie, Francis HERO, MD as Attending Physician (Pulmonary Disease) Belinda Cough, MD as Consulting Physician (General Surgery) Iruku, Praveena, MD as Consulting Physician (Hematology and Oncology) Lindzey Zent Agent, MD as Consulting Physician (Radiation Oncology)  Indicate any recent Medical Services you may have received from other than [facility] providers in the past year (date may be approximate).     Assessment:   This is a routine wellness examination for [place].  Hearing/Vision screen Hearing Screening   500Hz  1000Hz  2000Hz  4000Hz   Right ear Fail Fail Fail Fail  Left ear Fail Pass Pass Pass   Vision Screening   Right eye Left eye Both eyes  Without correction 20/70 0 20/50  With correction        Goals Addressed             This Visit's Progress    Eat Healthy         Depression Screen    10/27/2023    1:00 PM 04/21/2023   11:45 AM 10/13/2022    3:48 PM 08/12/2022    8:28 AM 10/06/2021    9:20 AM 07/10/2021    9:04 AM 12/27/2019   12:00 PM  PHQ 2/9 Scores  PHQ - 2 Score 1 2 2 1  0 1 2  PHQ- 9 Score 4 4 6 6   4     Fall Risk    12/31/2023    1:43 PM 10/27/2023   12:59 PM 07/30/2023   11:34 AM 06/04/2023    8:43 AM 04/21/2023   10:50 AM  Fall Risk   Falls in the past year? 0 0 0 1 0  Number falls in past yr: 0 0 0 0 0  Injury with Fall? 0 0 0 0 0  Risk for fall due to : No Fall Risks No Fall Risks No Fall Risks  No Fall Risks  Follow up Falls evaluation completed Falls evaluation completed Falls evaluation completed  Falls evaluation completed    MEDICARE RISK AT HOME: Medicare Risk at Home Any stairs in or around the home?: Yes If so, are there any without handrails?: No Home free of loose throw rugs in walkways, pet beds, electrical cords, etc?: Yes Adequate lighting in your home to reduce risk of falls?: Yes Life alert?: No Use of a cane, walker or w/c?: No Grab bars in the bathroom?: Yes Shower chair or bench in shower?: No Elevated toilet seat or  a handicapped toilet?: Yes  TIMED UP AND GO:  Was the test performed?no Gait steady and fast without use of assistive device    Cognitive Function:        12/31/2023    1:44 PM 10/06/2021    9:23 AM  6CIT Screen  What Year? 0 points 0 points  What month? 0 points 0 points  What time? 0 points 0 points  Count back from 20 2 points 0 points  Months in reverse 0 points 0 points  Repeat phrase 10 points 6 points  Total Score 12 points 6 points    Immunizations Immunization History  Administered Date(s) Administered   Fluad Quad(high Dose 65+) 04/03/2022   Influenza, High Dose Seasonal PF 03/16/2019, 03/31/2023   Influenza,inj,Quad PF,6+ Mos  05/03/2015, 03/18/2016   Influenza,inj,quad, With Preservative 03/25/2018   Influenza-Unspecified 03/22/2014, 03/15/2017, 03/15/2018, 03/04/2020, 02/13/2021, 03/19/2022   PFIZER Comirnaty(Gray Top)Covid-19 Tri-Sucrose Vaccine 11/20/2020   PFIZER(Purple Top)SARS-COV-2 Vaccination 06/29/2019, 07/20/2019, 03/04/2020   PNEUMOCOCCAL CONJUGATE-20 04/21/2023   Pfizer Covid-19 Vaccine Bivalent Booster 28yrs & up 01/07/2022   Pfizer(Comirnaty)Fall Seasonal Vaccine 12 years and older 03/31/2023   Pneumococcal Polysaccharide-23 08/17/2019   Zoster, Live 05/03/2015    TDAP status: Due, Education has been provided regarding the importance of this vaccine. Advised may receive this vaccine at local pharmacy or Health Dept. Aware to provide a copy of the vaccination record if obtained from local pharmacy or Health Dept. Verbalized acceptance and understanding.  Flu Vaccine status: Declined, Education has been provided regarding the importance of this vaccine but patient still declined. Advised may receive this vaccine at local pharmacy or Health Dept. Aware to provide a copy of the vaccination record if obtained from local pharmacy or Health Dept. Verbalized acceptance and understanding.  Pneumococcal vaccine status: Up to date  Covid-19 vaccine status:  Completed vaccines  Qualifies for Shingles Vaccine? Yes   Zostavax completed No   Shingrix Completed?: Yes  Screening Tests Health Maintenance  Topic Date Due   COVID-19 Vaccine (7 - Pfizer risk 2024-25 season) 01/16/2024 (Originally 09/29/2023)   Zoster Vaccines- Shingrix (1 of 2) 04/01/2024 (Originally 09/11/1974)   DTaP/Tdap/Td (1 - Tdap) 12/30/2024 (Originally 09/11/1974)   INFLUENZA VACCINE  01/14/2024   Medicare Annual Wellness (AWV)  12/30/2024   MAMMOGRAM  03/16/2025   Colonoscopy  08/07/2027   Pneumococcal Vaccine: 50+ Years  Completed   DEXA SCAN  Completed   Hepatitis C Screening  Completed   Hepatitis B Vaccines  Aged Out   HPV VACCINES  Aged Out   Meningococcal B Vaccine  Aged Out    Health Maintenance  There are no preventive care reminders to display for this patient.   Colorectal cancer screening: No longer required.   Mammogram status: Completed 03/17/23. Repeat every year  Bone Density status: Completed 03/03/2022. Results reflect: Bone density results: NORMAL. Repeat every 2 years.  Referral placed.  Lung Cancer Screening: (Low Dose CT Chest recommended if Age 33-80 years, 20 pack-year currently smoking OR have quit w/in 15years.) does not qualify.   Lung Cancer Screening Referral: n/a  Additional Screening:  Hepatitis C Screening: does not qualify; Completed 05/03/15 and negative.  Vision Screening: Recommended annual ophthalmology exams for early detection of glaucoma and other disorders of the eye. Is the patient up to date with their annual eye exam?  advised to schedule Who is the provider or what is the name of the office in which the patient attends annual eye exams?   Dental Screening: Recommended annual dental exams for proper oral hygiene  Diabetic Foot Exam: n/a  Community Resource Referral / Chronic Care Management: CRR required this visit?  No   CCM required this visit?  No     Plan:     I have personally reviewed and noted the  following in the patient's chart:   Medical and social history Use of alcohol, tobacco or illicit drugs  Current medications and supplements including opioid prescriptions. Patient is not currently taking opioid prescriptions. Functional ability and status Nutritional status Physical activity Advanced directives List of other physicians Hospitalizations, surgeries, and ER visits in previous 12 months Vitals Screenings to include cognitive, depression, and falls Referrals and appointments  In addition, I have reviewed and discussed with patient certain preventive protocols, quality metrics, and best  practice recommendations. A written personalized care plan for preventive services as well as general preventive health recommendations were provided to patient.   Encounter for Medicare annual wellness exam  Urinary frequency - Plan: POCT urinalysis dipstick, POC HgB A1c  S/P total knee arthroplasty, right  Chronic knee pain after total replacement of right knee joint  Screening for osteoporosis - Plan: DG Bone Density  Legally blind in left eye, as defined in [place]   Moderate persistent asthma, unspecified whether complicated   Clotilda JONELLE Single, MD   12/31/2023   After Visit Summary: (In Person-Printed) AVS printed and given to the patient

## 2024-01-10 ENCOUNTER — Encounter (HOSPITAL_COMMUNITY): Payer: Self-pay | Admitting: General Surgery

## 2024-01-18 DIAGNOSIS — H548 Legal blindness, as defined in USA: Secondary | ICD-10-CM | POA: Insufficient documentation

## 2024-01-18 DIAGNOSIS — G8929 Other chronic pain: Secondary | ICD-10-CM | POA: Insufficient documentation

## 2024-02-15 ENCOUNTER — Other Ambulatory Visit: Payer: Self-pay | Admitting: Family

## 2024-02-29 ENCOUNTER — Other Ambulatory Visit (HOSPITAL_COMMUNITY): Payer: Self-pay

## 2024-03-24 ENCOUNTER — Ambulatory Visit: Payer: Self-pay

## 2024-03-24 ENCOUNTER — Ambulatory Visit (HOSPITAL_COMMUNITY)
Admission: EM | Admit: 2024-03-24 | Discharge: 2024-03-24 | Disposition: A | Discharge status: Home / Self Care | Attending: Internal Medicine | Admitting: Internal Medicine

## 2024-03-24 ENCOUNTER — Encounter (HOSPITAL_COMMUNITY): Payer: Self-pay

## 2024-03-24 DIAGNOSIS — J029 Acute pharyngitis, unspecified: Secondary | ICD-10-CM

## 2024-03-24 DIAGNOSIS — J36 Peritonsillar abscess: Secondary | ICD-10-CM

## 2024-03-24 LAB — POCT RAPID STREP A (OFFICE): Rapid Strep A Screen: NEGATIVE

## 2024-03-24 MED ORDER — AMOXICILLIN-POT CLAVULANATE 875-125 MG PO TABS: 1.0000 | ORAL_TABLET | Freq: Two times a day (BID) | ORAL | 0 refills | Status: AC

## 2024-03-24 MED ORDER — PREDNISONE 20 MG PO TABS: 40.0000 mg | ORAL_TABLET | Freq: Every day | ORAL | 0 refills | Status: AC

## 2024-03-24 NOTE — ED Triage Notes (Signed)
 Sore throat on the left side onset this past Wednesday. States also has a slight runny nose onset today but denies other sick symptoms. No one else sick or having similar symptoms.   Patient tried 800 mg ibuprofen with no relief.

## 2024-03-24 NOTE — ED Provider Notes (Signed)
 [facility]    CSN: [id number] Arrival date & time: 03/24/24  1621      History   Chief Complaint Chief Complaint  Patient presents with   Sore Throat    HPI Rebecca [name] is a 68 y.o. female.   68 year old female presents urgent care with complaints of a sore throat that started on Wednesday.  She reports that the throat is only sore on the left side.  She has noted some swelling there as well.  She reports that it hurts to swallow,  yawn or talk.  She is not having any difficulty with swallowing just pain.  She denies any issues with managing her own secretions.  She denies any fevers, chills, congestion, headache, vomiting, nausea or sick contacts.   Sore Throat Pertinent negatives include no chest pain, no abdominal pain and no shortness of breath.    Past Medical History:  Diagnosis Date   Anxiety    Arthritis    Asthma    Blindness, legal    L EYE   Breast cancer (HCC)    GERD (gastroesophageal reflux disease)    Hearing loss    Hyperlipidemia    Hypertension    Lapband May 2013 11/18/2011   Migraines    Obesity, Class III, BMI 40-49.9 (morbid obesity) (HCC) 06/03/2011   Pre-diabetes    Sleep apnea    uses c-pap   Vertigo     Patient Active Problem List   Diagnosis Date Noted   Legally blind in left eye, as defined in [place]  01/18/2024   Chronic knee pain after total replacement of right knee joint 01/18/2024   History of laparoscopic adjustable gastric banding 10/27/2023   History of non-ST elevation myocardial infarction (NSTEMI) 10/27/2023   Pharyngoesophageal dysphagia 10/27/2023   GERD (gastroesophageal reflux disease) 06/18/2023   Acute asthma exacerbation 06/15/2023   Asthma exacerbation 06/14/2023   Abnormal CT of the chest 06/04/2023   Failed total knee arthroplasty 12/28/2022   Failed total right knee replacement 12/28/2022   Genetic testing 04/01/2022   Ductal carcinoma in situ of breast 03/26/2022   OA (osteoarthritis) of knee  08/18/2021   S/P total knee arthroplasty, right 08/18/2021   Prediabetes 12/29/2019   Vitamin D  deficiency 12/29/2019   Class 2 obesity due to excess calories with serious comorbidity and body mass index (BMI) of 38.0 to 38.9 in adult 08/31/2016   Hyperlipidemia 05/04/2016   Essential hypertension 12/26/2012   Asthma 12/26/2012   OSA (obstructive sleep apnea) 10/05/2011    Past Surgical History:  Procedure Laterality Date   ABDOMINAL HYSTERECTOMY  2006   fibroids   BREAST LUMPECTOMY WITH RADIOACTIVE SEED LOCALIZATION Left 04/21/2022   Procedure: LEFT BREAST LUMPECTOMY WITH RADIOACTIVE SEED LOCALIZATION;  Surgeon: Belinda Cough, MD;  Location: [facility];  Service: General;  Laterality: Left;   BREATH TEK H PYLORI  08/17/2011   Procedure: BREATH TEK H PYLORI;  Surgeon: Cough KATHEE Lunger, MD;  Location: [facility];  Service: General;  Laterality: N/A;  PATIENT WILL COME AT 0715   COLONOSCOPY  2008   @ [facility]    EYE SURGERY     Patient unsure of surgery date. Left eye   KNEE SURGERY Right 1992   right knee arthroscopy   LAPAROSCOPIC GASTRIC BANDING  10/20/2011   Procedure: LAPAROSCOPIC GASTRIC BANDING;  Surgeon: Cough KATHEE Lunger, MD;  Location: [facility];  Service: General;  Laterality: N/A;   RE-EXCISION OF BREAST LUMPECTOMY Left 05/05/2022   Procedure: RE-EXCISION  OF LEFT BREAST LUMPECTOMY SITE POSTERIOR MARGIN;  Surgeon: Belinda Cough, MD;  Location: [facility];  Service: General;  Laterality: Left;   TOTAL KNEE ARTHROPLASTY Right 08/18/2021   Procedure: TOTAL KNEE ARTHROPLASTY;  Surgeon: Melodi Lerner, MD;  Location: [facility];  Service: Orthopedics;  Laterality: Right;   TOTAL KNEE REVISION Right 12/28/2022   Procedure: Right knee femoral versus total knee arthroplasty revision;  Surgeon: Melodi Lerner, MD;  Location: [facility];  Service: Orthopedics;  Laterality: Right;    OB History   No obstetric history on file.      Home Medications    Prior  to Admission medications   Medication Sig Start Date End Date Taking? Authorizing Provider  albuterol  (PROVENTIL ) (2.5 MG/3ML) 0.083% nebulizer solution Take 3 mLs (2.5 mg total) by nebulization every 4 (four) hours as needed for wheezing or shortness of breath. 06/04/23  Yes Padgett, Danita Macintosh, MD  albuterol  (VENTOLIN  HFA) 108 [phone] Base) MCG/ACT inhaler Use 2 puffs every 4 hours as needed for cough or wheeze.  May use  2 puffs 10-20 minutes prior to exercise. 06/02/23  Yes Cheryl Reusing, FNP  amoxicillin -clavulanate (AUGMENTIN ) 875-125 MG tablet Take 1 tablet by mouth every 12 (twelve) hours for 10 days. 03/24/24 04/03/24 Yes Tomer Chalmers A, PA-C  azelastine  (ASTELIN ) 0.1 % nasal spray Place 2 sprays into both nostrils 2 (two) times daily. Use in each nostril as directed Patient taking differently: Place 2 sprays into both nostrils 2 (two) times daily as needed for rhinitis. Use in each nostril as directed 06/23/23  Yes Rai, Ripudeep K, MD  gabapentin  (NEURONTIN ) 100 MG capsule Take 1 capsule (100 mg total) by mouth 3 (three) times daily. 06/23/23  Yes Rai, Ripudeep K, MD  hydrochlorothiazide  (HYDRODIURIL ) 12.5 MG tablet Take 1 tablet (12.5 mg total) by mouth daily. 04/21/23  Yes [name], MD  hydrOXYzine  (ATARAX ) 25 MG tablet Take 1 tablet (25 mg total) by mouth 2 (two) times daily as needed for anxiety. 06/23/23  Yes Rai, Ripudeep K, MD  ibuprofen (ADVIL) 800 MG tablet Take 800 mg by mouth 2 (two) times daily as needed (for pain). 04/06/23  Yes [provider]  montelukast  (SINGULAIR ) 10 MG tablet TAKE 1 TABLET BY MOUTH EVERYDAY AT BEDTIME 02/16/24  Yes Cheryl Reusing, FNP  potassium chloride  SA (KLOR-CON  M) 20 MEQ tablet Take 1 tablet (20 mEq total) by mouth daily. 04/22/23  Yes [name], MD  predniSONE  (DELTASONE ) 20 MG tablet Take 2 tablets (40 mg total) by mouth daily with breakfast for 5 days. 03/24/24 03/29/24 Yes Letonya Mangels, Almarie LABOR, PA-C  PRESCRIPTION MEDICATION  See admin instructions. CPAP- At bedtime   Yes [provider]  sodium chloride  HYPERTONIC 3 % nebulizer solution Take 4 mLs by nebulization as needed for cough. 09/17/23  Yes [provider]  tamoxifen  (NOLVADEX ) 20 MG tablet TAKE 1 TABLET BY MOUTH EVERY DAY 11/02/23  Yes Causey, Morna Pickle, NP    Family History Family History  Problem Relation Age of Onset   Breast cancer Mother 70   Alcohol abuse Father    Heart disease Father 66       MI   Hypertension Father    Colon cancer Brother        dx after 45   Breast cancer Daughter 34    Social History Social History   Tobacco Use   Smoking status: Former    Current packs/day: 0.00    Average packs/day: 0.5 packs/day for  15.0 years (7.5 ttl pk-yrs)    Types: Cigarettes    Start date: 06/03/1979    Quit date: 06/02/1994    Years since quitting: 29.8    Passive exposure: Never   Smokeless tobacco: Never  Vaping Use   Vaping status: Never Used  Substance Use Topics   Alcohol use: No   Drug use: No     Allergies   Hydrocodone, Atorvastatin , Lisinopril , and Sulfa antibiotics   Review of Systems Review of Systems  Constitutional:  Negative for chills and fever.  HENT:  Positive for sore throat and trouble swallowing. Negative for ear pain.   Eyes:  Negative for pain and visual disturbance.  Respiratory:  Negative for cough and shortness of breath.   Cardiovascular:  Negative for chest pain and palpitations.  Gastrointestinal:  Negative for abdominal pain and vomiting.  Genitourinary:  Negative for dysuria and hematuria.  Musculoskeletal:  Negative for arthralgias and back pain.  Skin:  Negative for color change and rash.  Neurological:  Negative for seizures and syncope.  All other systems reviewed and are negative.    Physical Exam Triage Vital Signs ED Triage Vitals  Encounter Vitals Group     BP 03/24/24 1700 (!) 141/85     Girls Systolic BP Percentile --      Girls Diastolic BP  Percentile --      Boys Systolic BP Percentile --      Boys Diastolic BP Percentile --      Pulse Rate 03/24/24 1700 82     Resp 03/24/24 1700 18     Temp 03/24/24 1700 99 F (37.2 C)     Temp Source 03/24/24 1700 Oral     SpO2 03/24/24 1700 97 %     Weight 03/24/24 1700 195 lb (88.5 kg)     Height 03/24/24 1700 5' (1.524 m)     Head Circumference --      Peak Flow --      Pain Score 03/24/24 1658 8     Pain Loc --      Pain Education --      Exclude from Growth Chart --    No data found.  Updated Vital Signs BP (!) 141/85 (BP Location: Left Arm)   Pulse 82   Temp 99 F (37.2 C) (Oral)   Resp 18   Ht 5' (1.524 m)   Wt 195 lb (88.5 kg)   SpO2 97%   BMI 38.08 kg/m   Visual Acuity Right Eye Distance:   Left Eye Distance:   Bilateral Distance:    Right Eye Near:   Left Eye Near:    Bilateral Near:     Physical Exam Vitals and nursing note reviewed.  Constitutional:      General: She is not in acute distress.    Appearance: She is well-developed.  HENT:     Head: Normocephalic and atraumatic.     Nose: No congestion.     Mouth/Throat:     Mouth: Mucous membranes are moist.     Pharynx: Pharyngeal swelling present.     Tonsils: Tonsillar abscess present. No tonsillar exudate. 3+ on the left.  Eyes:     Conjunctiva/sclera: Conjunctivae normal.  Cardiovascular:     Rate and Rhythm: Normal rate and regular rhythm.     Heart sounds: No murmur heard. Pulmonary:     Effort: Pulmonary effort is normal. No respiratory distress.     Breath sounds: Normal breath sounds.  Abdominal:  Palpations: Abdomen is soft.     Tenderness: There is no abdominal tenderness.  Musculoskeletal:        General: No swelling.     Cervical back: Neck supple.  Skin:    General: Skin is warm and dry.     Capillary Refill: Capillary refill takes less than 2 seconds.  Neurological:     Mental Status: She is alert.  Psychiatric:        Mood and Affect: Mood normal.      UC  Treatments / Results  Labs (all labs ordered are listed, but only abnormal results are displayed) Labs Reviewed  POCT RAPID STREP A (OFFICE)    EKG   Radiology No results found.  Procedures Procedures (including critical care time)  Medications Ordered in UC Medications - No data to display  Initial Impression / Assessment and Plan / UC Course  I have reviewed the triage vital signs and the nursing notes.  Pertinent labs & imaging results that were available during my care of the patient were reviewed by me and considered in my medical decision making (see chart for details).     Peritonsillar abscess  Sore throat - Plan: POC rapid strep A, POC rapid strep A  Strep testing done today.  This is negative. Symptoms and physical exam findings are consistent with a left peritonsillar abscess.  The symptoms and physical exam findings are reassuring for outpatient treatment of this and we will start antibiotics as well as steroids to help with swelling.  We will treat with the following: Amoxicillin -clavulanate (Augmentin ) 875/125 mg twice daily for 10 days.  This is an antibiotic.  Take this with food. Prednisone  40 mg (2 tablets) once daily for 5 days. Take this in the morning.  This is a steroid to help with inflammation and pain. Make sure to stay hydrated by drinking plenty of water . If you begin to have trouble managing your own secretions or become unable to swallow or any difficulty breathing then recommend going to the emergency room immediately for further evaluation.   Final Clinical Impressions(s) / UC Diagnoses   Final diagnoses:  Sore throat  Peritonsillar abscess     Discharge Instructions      Strep testing done today.  This is negative. Symptoms and physical exam findings are consistent with a left peritonsillar abscess.  The symptoms and physical exam findings are reassuring for outpatient treatment of this and we will start antibiotics as well as steroids  to help with swelling.  We will treat with the following: Amoxicillin -clavulanate (Augmentin ) 875/125 mg twice daily for 10 days.  This is an antibiotic.  Take this with food. Prednisone  40 mg (2 tablets) once daily for 5 days. Take this in the morning.  This is a steroid to help with inflammation and pain. Make sure to stay hydrated by drinking plenty of water . If you begin to have trouble managing your own secretions or become unable to swallow or any difficulty breathing then recommend going to the emergency room immediately for further evaluation.     ED Prescriptions     Medication Sig Dispense Auth. Provider   predniSONE  (DELTASONE ) 20 MG tablet Take 2 tablets (40 mg total) by mouth daily with breakfast for 5 days. 10 tablet Teresa Norris A, PA-C   amoxicillin -clavulanate (AUGMENTIN ) 875-125 MG tablet Take 1 tablet by mouth every 12 (twelve) hours for 10 days. 20 tablet Teresa Norris LABOR, [place]      PDMP not reviewed this encounter.  Teresa Almarie LABOR, PA-C 03/24/24 1728

## 2024-03-24 NOTE — Discharge Instructions (Addendum)
 Strep testing done today.  This is negative. Symptoms and physical exam findings are consistent with a left peritonsillar abscess.  The symptoms and physical exam findings are reassuring for outpatient treatment of this and we will start antibiotics as well as steroids to help with swelling.  We will treat with the following: Amoxicillin -clavulanate (Augmentin ) 875/125 mg twice daily for 10 days.  This is an antibiotic.  Take this with food. Prednisone  40 mg (2 tablets) once daily for 5 days. Take this in the morning.  This is a steroid to help with inflammation and pain. Make sure to stay hydrated by drinking plenty of water . If you begin to have trouble managing your own secretions or become unable to swallow or any difficulty breathing then recommend going to the emergency room immediately for further evaluation.

## 2024-03-24 NOTE — Telephone Encounter (Signed)
 FYI Only or Action Required?: FYI only for provider.  Patient was last seen in primary care on 12/31/2023 by [name], MD.  Called Nurse Triage reporting Sore Throat.  Symptoms began several days ago.  Interventions attempted: OTC medications: ibuprofen and Rest, hydration, or home remedies.  Symptoms are: gradually worsening.  Triage Disposition: See PCP When Office is Open (Within 3 Days)  Patient/caregiver understands and will follow disposition?: Yes RN advised patient to go to urgent if symptoms worsen over the weekend. Pt verbalized understanding   Copied from CRM #[id number]. Topic: Clinical - Red Word Triage >> Mar 24, 2024  1:28 PM Paige D wrote: Red Word that prompted transfer to Nurse Triage: left side of the throat pain hurts to swallow, pt taking 800 Mg Ibprofen but is not working . 2 days now throat pain. Reason for Disposition  [1] Sore throat is the only symptom AND [2] present > 48 hours  Answer Assessment - Initial Assessment Questions 1. ONSET: When did the throat start hurting? (Hours or days ago)      2 days ago  2. SEVERITY: How bad is the sore throat? (Scale 1-10; mild, moderate or severe)     Moderate, hurts to talk, drink, and eat. Pain is primarily on left side  3. STREP EXPOSURE: Has there been any exposure to strep within the past week? If Yes, ask: What type of contact occurred?      No  4.  VIRAL SYMPTOMS: Are there any symptoms of a cold, such as a runny nose, cough, hoarse voice or red eyes?      Runny nose started today and chronic cough that has gotten more persistent within the last week  5. FEVER: Do you have a fever? If Yes, ask: What is your temperature, how was it measured, and when did it start?     No  6. PUS ON THE TONSILS: Is there pus on the tonsils in the back of your throat?     No  7. OTHER SYMPTOMS: Do you have any other symptoms? (e.g., difficulty breathing, headache, rash)     No  8. PREGNANCY: Is  there any chance you are pregnant? When was your last menstrual period?     No  Protocols used: Sore Throat-A-AH

## 2024-03-27 ENCOUNTER — Encounter: Payer: Self-pay | Admitting: Internal Medicine

## 2024-03-27 ENCOUNTER — Ambulatory Visit (INDEPENDENT_AMBULATORY_CARE_PROVIDER_SITE_OTHER): Admitting: Internal Medicine

## 2024-03-27 VITALS — BP 130/86 | HR 70 | Temp 98.2°F | Ht 60.0 in | Wt 199.1 lb

## 2024-03-27 DIAGNOSIS — J069 Acute upper respiratory infection, unspecified: Secondary | ICD-10-CM

## 2024-03-27 NOTE — Telephone Encounter (Signed)
 Patient went to UC on 03/24/24.

## 2024-03-27 NOTE — Progress Notes (Addendum)
 Established Patient Office Visit     CC/Reason for Visit:  URI symptoms  HPI: Rebecca [name] is a 68 y.o. female who is coming in today for the above mentioned reasons.  For the past 5 days has been having congestion, sore throat.  She went to urgent care where she was diagnosed with a left tonsillar abscess and placed on Augmentin  and prednisone .  She comes in today as she has not felt any improvement although admits she has had no worsening of symptoms either.   Past Medical/Surgical History: Past Medical History:  Diagnosis Date   Anxiety    Arthritis    Asthma    Blindness, legal    L EYE   Breast cancer (HCC)    GERD (gastroesophageal reflux disease)    Hearing loss    Hyperlipidemia    Hypertension    Lapband May 2013 11/18/2011   Migraines    Obesity, Class III, BMI 40-49.9 (morbid obesity) (HCC) 06/03/2011   Pre-diabetes    Sleep apnea    uses c-pap   Vertigo     Past Surgical History:  Procedure Laterality Date   ABDOMINAL HYSTERECTOMY  2006   fibroids   BREAST LUMPECTOMY WITH RADIOACTIVE SEED LOCALIZATION Left 04/21/2022   Procedure: LEFT BREAST LUMPECTOMY WITH RADIOACTIVE SEED LOCALIZATION;  Surgeon: Belinda Cough, MD;  Location: [facility];  Service: General;  Laterality: Left;   BREATH TEK H PYLORI  08/17/2011   Procedure: BREATH TEK H PYLORI;  Surgeon: Cough KATHEE Lunger, MD;  Location: [facility];  Service: General;  Laterality: N/A;  PATIENT WILL COME AT 0715   COLONOSCOPY  2008   @ [facility]    EYE SURGERY     Patient unsure of surgery date. Left eye   KNEE SURGERY Right 1992   right knee arthroscopy   LAPAROSCOPIC GASTRIC BANDING  10/20/2011   Procedure: LAPAROSCOPIC GASTRIC BANDING;  Surgeon: Cough KATHEE Lunger, MD;  Location: [facility];  Service: General;  Laterality: N/A;   RE-EXCISION OF BREAST LUMPECTOMY Left 05/05/2022   Procedure: RE-EXCISION OF LEFT BREAST LUMPECTOMY SITE POSTERIOR MARGIN;  Surgeon: Belinda Cough, MD;   Location: [facility];  Service: General;  Laterality: Left;   TOTAL KNEE ARTHROPLASTY Right 08/18/2021   Procedure: TOTAL KNEE ARTHROPLASTY;  Surgeon: Melodi Lerner, MD;  Location: [facility];  Service: Orthopedics;  Laterality: Right;   TOTAL KNEE REVISION Right 12/28/2022   Procedure: Right knee femoral versus total knee arthroplasty revision;  Surgeon: Melodi Lerner, MD;  Location: [facility];  Service: Orthopedics;  Laterality: Right;    Social History:  reports that she quit smoking about 29 years ago. Her smoking use included cigarettes. She started smoking about 44 years ago. She has a 7.5 pack-year smoking history. She has never been exposed to tobacco smoke. She has never used smokeless tobacco. She reports that she does not drink alcohol and does not use drugs.  Allergies: Allergies  Allergen Reactions   Hydrocodone Hives, Shortness Of Breath and Itching   Atorvastatin      Bone pain   Lisinopril  Cough   Sulfa Antibiotics Hives    Family History:  Family History  Problem Relation Age of Onset   Breast cancer Mother 89   Alcohol abuse Father    Heart disease Father 75       MI   Hypertension Father    Colon cancer Brother        dx after 103   Breast  cancer Daughter 78     Current Outpatient Medications:    albuterol  (PROVENTIL ) (2.5 MG/3ML) 0.083% nebulizer solution, Take 3 mLs (2.5 mg total) by nebulization every 4 (four) hours as needed for wheezing or shortness of breath., Disp: 75 mL, Rfl: 1   albuterol  (VENTOLIN  HFA) 108 (90 Base) MCG/ACT inhaler, Use 2 puffs every 4 hours as needed for cough or wheeze.  May use  2 puffs 10-20 minutes prior to exercise., Disp: 18 g, Rfl: 1   amoxicillin -clavulanate (AUGMENTIN ) 875-125 MG tablet, Take 1 tablet by mouth every 12 (twelve) hours for 10 days., Disp: 20 tablet, Rfl: 0   hydrochlorothiazide  (HYDRODIURIL ) 12.5 MG tablet, Take 1 tablet (12.5 mg total) by mouth daily., Disp: 90 tablet, Rfl: 3   ibuprofen (ADVIL) 800  MG tablet, Take 800 mg by mouth 2 (two) times daily as needed (for pain)., Disp: , Rfl:    montelukast  (SINGULAIR ) 10 MG tablet, TAKE 1 TABLET BY MOUTH EVERYDAY AT BEDTIME, Disp: 90 tablet, Rfl: 1   potassium chloride  SA (KLOR-CON  M) 20 MEQ tablet, Take 1 tablet (20 mEq total) by mouth daily., Disp: 90 tablet, Rfl: 3   predniSONE  (DELTASONE ) 20 MG tablet, Take 2 tablets (40 mg total) by mouth daily with breakfast for 5 days., Disp: 10 tablet, Rfl: 0   sodium chloride  HYPERTONIC 3 % nebulizer solution, Take 4 mLs by nebulization as needed for cough., Disp: , Rfl:    azelastine  (ASTELIN ) 0.1 % nasal spray, Place 2 sprays into both nostrils 2 (two) times daily. Use in each nostril as directed (Patient taking differently: Place 2 sprays into both nostrils 2 (two) times daily as needed for rhinitis. Use in each nostril as directed), Disp: 30 mL, Rfl: 12   gabapentin  (NEURONTIN ) 100 MG capsule, Take 1 capsule (100 mg total) by mouth 3 (three) times daily. (Patient not taking: Reported on 03/27/2024), Disp: 90 capsule, Rfl: 1   hydrOXYzine  (ATARAX ) 25 MG tablet, Take 1 tablet (25 mg total) by mouth 2 (two) times daily as needed for anxiety. (Patient not taking: Reported on 03/27/2024), Disp: 30 tablet, Rfl: 0   PRESCRIPTION MEDICATION, See admin instructions. CPAP- At bedtime (Patient not taking: Reported on 03/27/2024), Disp: , Rfl:    tamoxifen  (NOLVADEX ) 20 MG tablet, TAKE 1 TABLET BY MOUTH EVERY DAY (Patient not taking: Reported on 03/27/2024), Disp: 90 tablet, Rfl: 3  Review of Systems:  Negative unless indicated in HPI.   Physical Exam: Vitals:   03/27/24 1033  BP: 130/86  Pulse: 70  Temp: 98.2 F (36.8 C)  TempSrc: Oral  SpO2: 96%  Weight: 199 lb 1.6 oz (90.3 kg)  Height: 5' (1.524 m)    Body mass index is 38.88 kg/m.   Physical Exam Vitals reviewed.  Constitutional:      Appearance: Normal appearance.  HENT:     Right Ear: Tympanic membrane, ear canal and external ear normal.      Left Ear: Tympanic membrane, ear canal and external ear normal.     Mouth/Throat:     Mouth: Mucous membranes are moist.     Pharynx: Oropharynx is clear. Posterior oropharyngeal erythema present.  Eyes:     Conjunctiva/sclera: Conjunctivae normal.     Pupils: Pupils are equal, round, and reactive to light.  Cardiovascular:     Rate and Rhythm: Normal rate and regular rhythm.  Pulmonary:     Effort: Pulmonary effort is normal.     Breath sounds: Normal breath sounds.  Neurological:  Mental Status: She is alert.      Impression and Plan:  Viral upper respiratory tract infection   -Given exam findings, PNA, pharyngitis, ear infection are not likely, hence further abx have not been prescribed. -Have advised rest, fluids, OTC antihistamines, cough suppressants and mucinex . -RTC if no improvement in 10-14 days. - Complete out course of steroids and antibiotics as given at urgent care.  No signs of a tonsillar abscess today. - In office flu and COVID tests are negative.   Time spent:22 minutes reviewing chart, interviewing and examining patient and formulating plan of care.     Tully Theophilus Andrews, MD [facility]

## 2024-03-28 LAB — POCT INFLUENZA A/B
Influenza A, POC: NEGATIVE
Influenza B, POC: NEGATIVE

## 2024-03-28 LAB — POC COVID19 BINAXNOW: SARS Coronavirus 2 Ag: NEGATIVE

## 2024-03-28 NOTE — Addendum Note (Signed)
 Addended by: Abagayle Klutts on: 03/28/2024 08:35 AM   Modules accepted: Orders

## 2024-04-19 ENCOUNTER — Ambulatory Visit: Admitting: Family

## 2024-04-19 ENCOUNTER — Other Ambulatory Visit: Payer: Self-pay

## 2024-04-19 ENCOUNTER — Encounter: Payer: Self-pay | Admitting: Family

## 2024-04-19 VITALS — BP 142/100 | HR 77 | Temp 98.7°F

## 2024-04-19 DIAGNOSIS — J479 Bronchiectasis, uncomplicated: Secondary | ICD-10-CM

## 2024-04-19 DIAGNOSIS — J310 Chronic rhinitis: Secondary | ICD-10-CM | POA: Diagnosis not present

## 2024-04-19 DIAGNOSIS — J4540 Moderate persistent asthma, uncomplicated: Secondary | ICD-10-CM

## 2024-04-19 DIAGNOSIS — Z91148 Patient's other noncompliance with medication regimen for other reason: Secondary | ICD-10-CM

## 2024-04-19 MED ORDER — MONTELUKAST SODIUM 10 MG PO TABS: 10.0000 mg | ORAL_TABLET | Freq: Every day | ORAL | 1 refills | Status: AC

## 2024-04-19 NOTE — Progress Notes (Signed)
 [address] [place] [address] Dept: [phone]  FOLLOW UP NOTE  Patient ID: Rebecca [name], female    DOB: [birth year]  Age: 68 y.o. MRN: [id number] Date of Office Visit: 04/19/2024  Assessment  Chief Complaint: Follow-up (No concerns) and Medication Refill  HPI Rebecca [name] is a 68 year old female who presents today for follow-up of not well-controlled moderate persistent asthma, mixed rhinitis, and acute cough.  She was last seen by myself on June 02, 2023 and did not follow-up in 6 weeks as recommended.  Since her last office visit she has been diagnosed with bronchiectasis and emphysema and is followed by pulmonology.  She also had her Lap-Band removed on Nov 09, 2023.  She was also admitted to the hospital on July 10, 2023 for precordial pain and shortness of breath.  She did have a left heart cath completed on July 13, 2023 for concern of type II NSTEMI, however no acute findings were found.  Asthma: She reports that she is currently not taking any medications daily because she has so many that she is not sure what to use.  She does take montelukast  at night.  She reports a dry cough and wheezing every day.  She denies tightness in chest, shortness of breath, and nocturnal awakenings due to breathing problems.  She does use her albuterol  as needed.  She has received multiple rounds of steroids since her last office visit.  She did have a CT chest without contrast high-resolution on September 15, 2023 showing: 1. Minimal upper lobe subpleural coarsening ground-glass and  reticular densities, findings which may be due to mild fibrotic  nonspecific interstitial pneumonitis. Findings are suggestive of an  alternative diagnosis (not UIP) per consensus guidelines: Diagnosis  of Idiopathic Pulmonary Fibrosis: An Official ATS/ERS/JRS/ALAT  Clinical Practice Guideline. Am JINNY [employer] Crit Care Med Vol 198, Iss  5, [phone], Feb 13 2017.  2. Cylindrical bronchiectasis.  3. Hepatic  steatosis.  4. Aortic atherosclerosis (ICD10-I70.0). Coronary artery  calcification.  5. Enlarged pulmonic trunk, indicative of pulmonary arterial  hypertension.  6. Emphysema (ICD10-J43.9).   She also had a laryngoscopy on July 22, 2023 showing:  Arthea Maude Fries, MD     07/22/2023  9:11 AM  Laryngoscopy   Date/Time: 07/22/2023 8:15 AM   Performed by: Arthea Maude Fries, MD  Authorized by: Arthea Maude Fries, MD  Comments: Bedside flexible  laryngoscopy was performed after topical lidocaine  and Afrin was applied.   The vocal cords showed normal mobility bilaterally.  No masses or lesions  were seen.  Thick nonpurulent mucus in the posterior aspect of the right  nasal cavity   Mixed rhinitis: Currently right now she is not using any nasal sprays and not taking any antihistamines.  She denies rhinorrhea, nasal congestion, and postnasal drip.  She has not been treated for any sinus infections since we last saw her.  Gastroesophageal reflux disease: She reports that she has been prescribed something for reflux, but is not taking it.  She mentions that she probably should start back.  She is not having any symptoms of reflux such as burning.  She did see GI on 10/20/23 and that office visit shows: Clinical Impression:  Clinical Impression: Patient with functional oral pharyngeal swallow  ability without aspiration or penetration of any consistency tested.  Motility was normal and there was no oropharyngeal retention related to  her DISH. Swallow was strong and timely. Patient was noted during  evaluation to start overtly coughing after  swallow of thin without barium  visualized in larynx or trachea. Suspect this is potentially a reflux  cough given she has history of reflux. She reports she does not take a  PPI prior to admission but currently is on PPI twice daily and SLP advised  she follow a reflux prudent diet.   Of note after patient swallowed barium  tablet upon esophageal sweep she  appeared with barium tablet and thin barium momentarily halting at distal  esophagus with sensation to pharynx. Delayed dry swallow effectively  cleared esophagus.   Patient questioned why coughing causes her to stop breathing, given  history of reflux SLP questions if symptoms are consistent with  laryngospasm associated with reflux.   SLP questions if pt may have esophageal web - that did not impair barium  tablet flow.   Patient educated to findings of MBS using fluoroscopy loops for details  and she reports understanding.   Laboratory/imaging/procedures: Orders Placed This Encounter  Procedures  Ambulatory referral to General Surgery  Endo EGD   Assessment: Encounter Diagnoses  Name Primary?  Pharyngoesophageal dysphagia Yes  LAP-BAND surgery status   Assessment & Plan 1. Esophageal dysfunction. The patient's symptoms of dysphagia, coughing, and reflux are possibly exacerbated by the presence of a gastric band, which is known to increase reflux. 08/2023 Barium esophogram showed cervial level esophageal web and dysmotility . An endoscopy will be scheduled to further investigate the esophageal dysfunction.   2. Lap Band Status A referral to a bariatric surgeon has been initiated for a consultation regarding the potential removal of the gastric band. Her pulmonologist queries a degree of aspiration contributed to by lap band   She reports that she has not had any sinus infections, pneumonia, or skin infections since we last saw her.  Last month she was given an antibiotic that she took for 5 days and stopped because she could not control her bowels.  She was given the antibiotic for possible peritonsillar abscess.  Other than that she reports that she has not received any other antibiotics.  After reviewing epic however though on December 05, 2023 she received a prescription for cephalexin  for possible wound infection.   Drug Allergies:  Allergies   Allergen Reactions   Hydrocodone Hives, Shortness Of Breath and Itching   Atorvastatin      Bone pain   Lisinopril  Cough   Sulfa Antibiotics Hives    Review of Systems: Negative except as per HPI   Physical Exam: Pulse 77   Temp 98.7 F (37.1 C)   SpO2 96%    Physical Exam Constitutional:      Appearance: Normal appearance.  HENT:     Head: Normocephalic and atraumatic.     Comments: Pharynx normal, eyes normal, ears normal, nose normal    Right Ear: Tympanic membrane, ear canal and external ear normal.     Left Ear: Tympanic membrane, ear canal and external ear normal.     Nose: Nose normal.     Mouth/Throat:     Mouth: Mucous membranes are moist.     Pharynx: Oropharynx is clear.  Eyes:     Conjunctiva/sclera: Conjunctivae normal.  Cardiovascular:     Rate and Rhythm: Regular rhythm.     Heart sounds: Normal heart sounds.  Pulmonary:     Effort: Pulmonary effort is normal.     Breath sounds: Normal breath sounds.     Comments: Lungs clear to auscultation Musculoskeletal:     Cervical back: Neck supple.  Skin:  General: Skin is warm.  Neurological:     Mental Status: She is alert and oriented to person, place, and time.  Psychiatric:        Mood and Affect: Mood normal.        Behavior: Behavior normal.        Thought Content: Thought content normal.        Judgment: Judgment normal.     Diagnostics:  None  Assessment and Plan: 1. Not well controlled moderate persistent asthma   2. Mixed rhinitis   3. Bronchiectasis without complication (HCC)   4. Noncompliance with medication regimen     Meds ordered this encounter  Medications   montelukast  (SINGULAIR ) 10 MG tablet    Sig: Take 1 tablet (10 mg total) by mouth at bedtime.    Dispense:  90 tablet    Refill:  1    Patient Instructions  Asthma/bronchiectasis/centrilobular emphysema-not well controlled - Continue to follow up with pulmonology. Keep upcoming appointment tomorrow with  pulmonology - Continue current regimen as per pulmonology -At your appointment tomorrow find out exactly what medications they want you to use -I will get lab work to check your immune system due to your recent diagnosis of bronchiectasis. We will call you with results once they are all back. - Continue Singulair  10mg  daily.Patient cautioned that rarely some children/adults can experience behavioral changes after beginning montelukast . These side effects are rare, however, if you notice any change, notify the clinic and discontinue montelukast . - have access to Albuterol  inhaler 2 puffs every 4-6 hours as needed for cough, wheeze, shortness of breath, chest tightness.   Asthma control goals:  Full participation in all desired activities (may need albuterol  before activity) Albuterol  use two time or less a week on average (not counting use with activity) Cough interfering with sleep two time or less a month Oral steroids no more than once a year No hospitalizations  Mixed rhinitis-controlled - May use Ryaltris  2 sprays each nostril twice a day at this time.  This is a combination nasal spray with nasal steroid (for congestion control) and nasal antihistamine (for drainage control).   - May use Zyrtec  10mg  daily as needed for runny nose - Saline nasal wash each evening in the shower and as needed. -Continue to follow up with ENT as needed for hoarseness  Reflux -Continue to follow up with GI. Recommend scheduling a follow up appointment to discuss belching and what medications they want you to take -Take medications as prescribed  Follow-up in 2 months with Dr. Jeneal or sooner if needed.  Return in about 2 months (around 06/19/2024), or if symptoms worsen or fail to improve.    Thank you for the opportunity to care for this patient.  Please do not hesitate to contact me with questions.  [name] Craze, FNP [facility] 

## 2024-04-19 NOTE — Patient Instructions (Addendum)
 Asthma/bronchiectasis/centrilobular emphysema-not well controlled - Continue to follow up with pulmonology. Keep upcoming appointment tomorrow with pulmonology - Continue current regimen as per pulmonology -At your appointment tomorrow find out exactly what medications they want you to use -I will get lab work to check your immune system due to your recent diagnosis of bronchiectasis. We will call you with results once they are all back. - Continue Singulair  10mg  daily.Patient cautioned that rarely some children/adults can experience behavioral changes after beginning montelukast . These side effects are rare, however, if you notice any change, notify the clinic and discontinue montelukast . - have access to Albuterol  inhaler 2 puffs every 4-6 hours as needed for cough, wheeze, shortness of breath, chest tightness.   Asthma control goals:  Full participation in all desired activities (may need albuterol  before activity) Albuterol  use two time or less a week on average (not counting use with activity) Cough interfering with sleep two time or less a month Oral steroids no more than once a year No hospitalizations  Mixed rhinitis-controlled - May use Ryaltris  2 sprays each nostril twice a day at this time.  This is a combination nasal spray with nasal steroid (for congestion control) and nasal antihistamine (for drainage control).   - May use Zyrtec  10mg  daily as needed for runny nose - Saline nasal wash each evening in the shower and as needed. -Continue to follow up with ENT as needed for hoarseness  Reflux -Continue to follow up with GI. Recommend scheduling a follow up appointment to discuss belching and what medications they want you to take -Take medications as prescribed  Follow-up in 2 months with Dr. Jeneal or sooner if needed.

## 2024-04-20 ENCOUNTER — Ambulatory Visit: Admitting: Adult Health

## 2024-04-20 ENCOUNTER — Ambulatory Visit (INDEPENDENT_AMBULATORY_CARE_PROVIDER_SITE_OTHER): Admitting: *Deleted

## 2024-04-20 ENCOUNTER — Encounter: Payer: Self-pay | Admitting: Adult Health

## 2024-04-20 VITALS — BP 138/80 | HR 89 | Ht 61.0 in | Wt 202.0 lb

## 2024-04-20 DIAGNOSIS — J4541 Moderate persistent asthma with (acute) exacerbation: Secondary | ICD-10-CM | POA: Diagnosis not present

## 2024-04-20 DIAGNOSIS — J984 Other disorders of lung: Secondary | ICD-10-CM | POA: Diagnosis not present

## 2024-04-20 DIAGNOSIS — G4733 Obstructive sleep apnea (adult) (pediatric): Secondary | ICD-10-CM

## 2024-04-20 DIAGNOSIS — K219 Gastro-esophageal reflux disease without esophagitis: Secondary | ICD-10-CM

## 2024-04-20 DIAGNOSIS — Z87891 Personal history of nicotine dependence: Secondary | ICD-10-CM

## 2024-04-20 DIAGNOSIS — J4530 Mild persistent asthma, uncomplicated: Secondary | ICD-10-CM

## 2024-04-20 DIAGNOSIS — J849 Interstitial pulmonary disease, unspecified: Secondary | ICD-10-CM

## 2024-04-20 LAB — PULMONARY FUNCTION TEST
DL/VA % pred: 87 %
DL/VA: 3.69 ml/min/mmHg/L
DLCO cor % pred: 61 %
DLCO cor: 10.83 ml/min/mmHg
DLCO unc % pred: 61 %
DLCO unc: 10.83 ml/min/mmHg
FEF 25-75 Post: 1.55 L/s
FEF 25-75 Pre: 1.34 L/s
FEF2575-%Change-Post: 16 %
FEF2575-%Pred-Post: 89 %
FEF2575-%Pred-Pre: 77 %
FEV1-%Change-Post: 3 %
FEV1-%Pred-Post: 74 %
FEV1-%Pred-Pre: 71 %
FEV1-Post: 1.48 L
FEV1-Pre: 1.43 L
FEV1FVC-%Change-Post: -3 %
FEV1FVC-%Pred-Pre: 104 %
FEV6-%Change-Post: 7 %
FEV6-%Pred-Post: 76 %
FEV6-%Pred-Pre: 71 %
FEV6-Post: 1.92 L
FEV6-Pre: 1.79 L
FEV6FVC-%Pred-Post: 104 %
FEV6FVC-%Pred-Pre: 104 %
FVC-%Change-Post: 7 %
FVC-%Pred-Post: 73 %
FVC-%Pred-Pre: 68 %
FVC-Post: 1.94 L
FVC-Pre: 1.8 L
Post FEV1/FVC ratio: 76 %
Post FEV6/FVC ratio: 100 %
Pre FEV1/FVC ratio: 79 %
Pre FEV6/FVC Ratio: 100 %
RV % pred: 69 %
RV: 1.42 L
TLC % pred: 73 %
TLC: 3.37 L

## 2024-04-20 MED ORDER — FLUTICASONE FUROATE-VILANTEROL 100-25 MCG/ACT IN AEPB: 1.0000 | INHALATION_SPRAY | Freq: Every day | RESPIRATORY_TRACT | 5 refills | Status: AC

## 2024-04-20 MED ORDER — PANTOPRAZOLE SODIUM 40 MG PO TBEC
40.0000 mg | DELAYED_RELEASE_TABLET | Freq: Every day | ORAL | 3 refills | Status: AC
Start: 2024-04-20 — End: ?

## 2024-04-20 NOTE — Progress Notes (Signed)
 @Patient  ID: Rebecca [name], female    DOB: 19-Jan-1956, 68 y.o.   MRN: [id number]  Chief Complaint  Patient presents with   Follow-up    PFT    Referring provider: Orlie Madelin RAMAN, NP  HPI: 68 year old female former smoker followed for severe obstructive sleep apnea and asthma, and ILD     TEST/EVENTS : Reviewed 04/20/2024  Spirometry 08/30/15 >> FEV1 1.82 (78%), FEV1% 81  CT chest June 02, 2023 subpleural reticular and ground glass opacities suspicious for interstitial lung disease, benign cyst right lower lobe negative for PE  High-resolution CT chest September 15, 2023 minimum subpleural coarsening ground glass and reticular densities may be due to mild fibrotic nonspecific interstitial pneumonitis finding suggestive of alternative diagnosis not UIP, cylindrical bronchiectasis  Hypersensitivity pneumonitis panel August 05, 2023 negative, IgE 18  Discussed the use of AI scribe software for clinical note transcription with the patient, who gave verbal consent to proceed.  History of Present Illness Rebecca [name] is a 68 year old female who presents for evaluation of breathing difficulties and management of sleep apnea.  Patient was last seen in the office December 2024 after suspected asthmatic bronchitic exacerbation.  Patient is former smoker.  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 She experiences persistent breathing difficulties characterized by phlegm production and occasional chest discomfort. No exposure to mold, birds, or pets, and no history of autoimmune disorders.  No previous exposure to amiodarone, Macrodantin, methotrexate.  Her cough is currently not severe, and her breathing is manageable, though she occasionally feels she cannot catch her breath.  Does feel that her cough has gotten better.  Previous CT chest June 02, 2023 showed subpleural reticular and ground glass opacities concerning for  interstitial lung disease.  Since last visit patient was seen at [facility] pulmonary lab work showed a negative hypersensitivity pneumonitis panel IgE level at 18 repeat CT chest high-resolution September 15, 2023 showed minimal subpleural coarsening ground glass and reticular densities.  Patient says overall she does feel that her breathing has improved.  She says her cough got much better after stopping ACE inhibitor.  Has been off inhalers for several months.  Does occasionally get shortness of breath.  She was set up for pulmonary function testing that was completed today that shows moderate restriction with FEV1 at 74%, ratio 76, FVC 73%, no significant bronchodilator response, mid flow reversibility, DLCO decreased at 61%.  Patient says overall she is active does not feel like she is greatly limited by her breathing.  She has been diagnosed with sleep apnea and was using a CPAP machine until an incident on Christmas morning when she experienced severe breathing difficulty, which she attributes to the CPAP. Since then, she has not used the CPAP machine. She reports sleeping well without it and denies waking up short of breath, although she has gained approximately twenty-five pounds since discontinuing the CPAP.  Previous sleep study showed severe sleep apnea patient is concerned that her sleep apnea is come back since she has gained over 25 pounds.  Does have some occasional snoring.  She has a history of reflux, noted during a previous hospital visit. She is not currently on any reflux medication and denies trouble swallowing or choking. She has experienced bronchitis frequently in the past and reports occasional reflux symptoms.     Allergies  Allergen Reactions   Hydrocodone Hives, Shortness Of Breath and Itching  Atorvastatin      Bone pain   Lisinopril  Cough   Sulfa Antibiotics Hives    Immunization History  Administered Date(s) Administered   Fluad Quad(high Dose 65+) 04/03/2022    INFLUENZA, HIGH DOSE SEASONAL PF 03/16/2019, 03/31/2023   Influenza,inj,Quad PF,6+ Mos 05/03/2015, 03/18/2016   Influenza,inj,quad, With Preservative 03/25/2018   Influenza-Unspecified 03/22/2014, 03/15/2017, 03/15/2018, 03/04/2020, 02/13/2021, 03/19/2022   PFIZER Comirnaty(Gray Top)Covid-19 Tri-Sucrose Vaccine 11/20/2020   PFIZER(Purple Top)SARS-COV-2 Vaccination 06/29/2019, 07/20/2019, 03/04/2020   PNEUMOCOCCAL CONJUGATE-20 04/21/2023   Pfizer Covid-19 Vaccine Bivalent Booster 70yrs & up 01/07/2022   Pfizer(Comirnaty)Fall Seasonal Vaccine 12 years and older 03/31/2023   Pneumococcal Polysaccharide-23 08/17/2019   Zoster, Live 05/03/2015    Past Medical History:  Diagnosis Date   Anxiety    Arthritis    Asthma    Blindness, legal    L EYE   Breast cancer (HCC)    GERD (gastroesophageal reflux disease)    Hearing loss    Hyperlipidemia    Hypertension    Lapband May 2013 11/18/2011   Migraines    Obesity, Class III, BMI 40-49.9 (morbid obesity) (HCC) 06/03/2011   Pre-diabetes    Sleep apnea    uses c-pap   Vertigo     Tobacco History: Social History   Tobacco Use  Smoking Status Former   Types: Cigarettes   Passive exposure: Never  Smokeless Tobacco Never  Tobacco Comments   Quit smoking in 1980. Smoked about 10-15 years, 1/2ppd   Counseling given: Not Answered Tobacco comments: Quit smoking in 1980. Smoked about 10-15 years, 1/2ppd   Outpatient Medications Prior to Visit  Medication Sig Dispense Refill   albuterol  (PROVENTIL ) (2.5 MG/3ML) 0.083% nebulizer solution Take 3 mLs (2.5 mg total) by nebulization every 4 (four) hours as needed for wheezing or shortness of breath. 75 mL 1   albuterol  (VENTOLIN  HFA) 108 (90 Base) MCG/ACT inhaler Use 2 puffs every 4 hours as needed for cough or wheeze.  May use  2 puffs 10-20 minutes prior to exercise. 18 g 1   azelastine  (ASTELIN ) 0.1 % nasal spray Place 2 sprays into both nostrils 2 (two) times daily. Use in each nostril  as directed (Patient taking differently: Place 2 sprays into both nostrils 2 (two) times daily as needed for rhinitis. Use in each nostril as directed) 30 mL 12   hydrochlorothiazide  (HYDRODIURIL ) 12.5 MG tablet Take 1 tablet (12.5 mg total) by mouth daily. 90 tablet 3   ibuprofen (ADVIL) 800 MG tablet Take 800 mg by mouth 2 (two) times daily as needed (for pain).     montelukast  (SINGULAIR ) 10 MG tablet Take 1 tablet (10 mg total) by mouth at bedtime. 90 tablet 1   sodium chloride  HYPERTONIC 3 % nebulizer solution Take 4 mLs by nebulization as needed for cough.     tamoxifen  (NOLVADEX ) 20 MG tablet TAKE 1 TABLET BY MOUTH EVERY DAY 90 tablet 3   gabapentin  (NEURONTIN ) 100 MG capsule Take 1 capsule (100 mg total) by mouth 3 (three) times daily. (Patient not taking: Reported on 04/20/2024) 90 capsule 1   hydrOXYzine  (ATARAX ) 25 MG tablet Take 1 tablet (25 mg total) by mouth 2 (two) times daily as needed for anxiety. (Patient not taking: Reported on 04/20/2024) 30 tablet 0   potassium chloride  SA (KLOR-CON  M) 20 MEQ tablet Take 1 tablet (20 mEq total) by mouth daily. (Patient not taking: Reported on 04/20/2024) 90 tablet 3   PRESCRIPTION MEDICATION See admin instructions. CPAP- At bedtime (Patient not taking: Reported  on 04/20/2024)     No facility-administered medications prior to visit.     Review of Systems:   Constitutional:   No  weight loss, night sweats,  Fevers, chills, +fatigue, or  lassitude.  HEENT:   No headaches,  Difficulty swallowing,  Tooth/dental problems, or  Sore throat,                No sneezing, itching, ear ache, +nasal congestion, post nasal drip,   CV:  No chest pain,  Orthopnea, PND, swelling in lower extremities, anasarca, dizziness, palpitations, syncope.   GI  No heartburn, indigestion, abdominal pain, nausea, vomiting, diarrhea, change in bowel habits, loss of appetite, bloody stools.   Resp: .  No chest wall deformity  Skin: no rash or lesions.  GU: no dysuria,  change in color of urine, no urgency or frequency.  No flank pain, no hematuria   MS:  No joint pain or swelling.  No decreased range of motion.  No back pain.    Physical Exam  BP 138/80   Pulse 89   Ht 5' 1 (1.549 m)   Wt 202 lb (91.6 kg)   SpO2 93% Comment: RA  BMI 38.17 kg/m   GEN: A/Ox3; pleasant , NAD, well nourished    HEENT:  [place]/AT,   NOSE-clear, THROAT-clear, no lesions, no postnasal drip or exudate noted.   NECK:  Supple w/ fair ROM; no JVD; normal carotid impulses w/o bruits; no thyromegaly or nodules palpated; no lymphadenopathy.    RESP  Clear  P & A; w/o, wheezes/ rales/ or rhonchi. no accessory muscle use, no dullness to percussion  CARD:  RRR, no m/r/g, no peripheral edema, pulses intact, no cyanosis or clubbing.  GI:   Soft & nt; nml bowel sounds; no organomegaly or masses detected.   Musco: Warm bil, no deformities or joint swelling noted.   Neuro: alert, no focal deficits noted.    Skin: Warm, no lesions or rashes    Lab Results:Reviewed 04/20/2024   CBC    Component Value Date/Time   WBC 6.8 11/04/2023 1108   RBC 4.34 11/04/2023 1108   HGB 13.2 11/04/2023 1108   HGB 12.9 04/01/2022 0826   HCT 41.4 11/04/2023 1108   PLT 298 11/04/2023 1108   PLT 400 04/01/2022 0826   MCV 95.4 11/04/2023 1108   MCH 30.4 11/04/2023 1108   MCHC 31.9 11/04/2023 1108   RDW 12.8 11/04/2023 1108   LYMPHSABS 3.9 06/14/2023 1412   MONOABS 0.4 06/14/2023 1412   EOSABS 0.1 06/14/2023 1412   BASOSABS 0.0 06/14/2023 1412    BMET    Component Value Date/Time   NA 141 11/04/2023 1108   K 4.3 11/04/2023 1108   CL 108 11/04/2023 1108   CO2 26 11/04/2023 1108   GLUCOSE 91 11/04/2023 1108   BUN 19 11/04/2023 1108   CREATININE 0.99 11/04/2023 1108   CREATININE 0.92 04/01/2022 0826   CREATININE 0.79 12/27/2019 1210   CALCIUM  9.3 11/04/2023 1108   GFRNONAA >60 11/04/2023 1108   GFRNONAA >60 04/01/2022 0826   GFRAA >90 10/20/2011 1843    BNP    Component Value  Date/Time   BNP 18.8 06/14/2023 1412    ProBNP No results found for: PROBNP  Imaging: No results found.  Administration History     None          Latest Ref Rng & Units 04/20/2024    1:47 PM  PFT Results  FVC-Pre L 1.80  P  FVC-Predicted  Pre % 68  P  FVC-Post L 1.94  P  FVC-Predicted Post % 73  P  Pre FEV1/FVC % % 79  P  Post FEV1/FCV % % 76  P  FEV1-Pre L 1.43  P  FEV1-Predicted Pre % 71  P  FEV1-Post L 1.48  P  DLCO uncorrected ml/min/mmHg 10.83  P  DLCO UNC% % 61  P  DLCO corrected ml/min/mmHg 10.83  P  DLCO COR %Predicted % 61  P  DLVA Predicted % 87  P  TLC L 3.37  P  TLC % Predicted % 73  P  RV % Predicted % 69  P    P Preliminary result    No results found for: NITRICOXIDE      No data to display              Assessment & Plan:   Assessment and Plan Assessment & Plan Interstitial lung disease -appears stable with notable changes on high-resolution CT chest considered mild.  Pulmonary function test does show mild to moderate restriction with a decreased diffusing capacity.  Hypersensitivity panel was normal.  Recent immune panel normal.  Will check autoimmune labs with ANA, CCP and rheumatoid factor.  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 Labs are ordered to check for autoimmune disorders. A breathing test will be repeated in six months. Referral to an interstitial lung disease specialist to establish with in our group- planned going forward.  Obstructive sleep apnea  -history of severe obstructive sleep apnea currently off of CPAP for greater than 1 year.  With ongoing symptoms suspicious for sleep apnea. She previously used CPAP but discontinued due to adverse effects, including a sensation of esophageal closure. Weight gain has occurred since stopping CPAP, and there are concerns about the recurrence of sleep apnea symptoms. A home sleep study is ordered. She is  advised to sleep with the head of the bed elevated and avoid sleeping on her back.  Set up for home sleep study.  Gastroesophageal reflux disease   Reflux was noted during hospitalization, and she is not currently on reflux medications. Weight gain may be contributing to reflux symptoms, and there is a potential link between reflux and lung scarring. Protonix  40 mg once daily is prescribed, and she is advised to follow a reflux diet, avoiding spicy foods and eating late at night.  Asthma and chronic bronchitis, possible components   Restrictive lung problems may have an asthma or chronic bronchitis component. Her cough has improved  with occasional tightness and phlegm production. Previous use of [place]  inhaler showed unclear benefits. Breo inhaler is prescribed once daily, with instructions on proper inhaler technique, including brushing, rinsing, and gargling after use. Albuterol  is to be used as needed. The effectiveness of Breo will be evaluated in six weeks.  Plan  Patient Instructions  Begin Breo 1 puff daily, rinse after use.  Albuterol  inhaler As needed   Labs today.   Check Home sleep study.  Work on healthy sleep regimen  Sleep with head of bed elevated at 30 degrees Work on healthy weight  Do not drive if sleepy   Begin Protonix  40mg  daily  GERD diet   Follow up in 6 weeks -[facility] and As needed               Marathon Oil, NP 04/20/2024  I spent   minutes dedicated to the care of  this patient on the date of this encounter to include pre-visit review of records, face-to-face time with the patient discussing conditions above, post visit ordering of testing, clinical documentation with the electronic health record, making appropriate referrals as documented, and communicating necessary findings to members of the patients care team.

## 2024-04-20 NOTE — Patient Instructions (Addendum)
 Begin Breo 1 puff daily, rinse after use.  Albuterol  inhaler As needed   Labs today.   Check Home sleep study.  Work on healthy sleep regimen  Sleep with head of bed elevated at 30 degrees Work on healthy weight  Do not drive if sleepy   Begin Protonix  40mg  daily  GERD diet   Follow up in 6 weeks -[facility] and As needed

## 2024-04-20 NOTE — Patient Instructions (Signed)
 Full PFT performed today.

## 2024-04-20 NOTE — Progress Notes (Signed)
 Full PFT performed today.

## 2024-04-22 LAB — ANA: Anti Nuclear Antibody (ANA): POSITIVE — AB

## 2024-04-22 LAB — ANTI-NUCLEAR AB-TITER (ANA TITER): ANA Titer 1: 1:40 {titer} — ABNORMAL HIGH

## 2024-04-22 LAB — RHEUMATOID FACTOR: Rheumatoid fact SerPl-aCnc: <10 [IU]/mL (ref <14)

## 2024-04-22 LAB — CYCLIC CITRUL PEPTIDE ANTIBODY, IGG: Cyclic Citrullin Peptide Ab: <16 U

## 2024-04-26 LAB — STREP PNEUMONIAE 23 SEROTYPES IGG
Pneumo Ab Type 1*: 0.2 ug/mL — ABNORMAL LOW (ref >1.3)
Pneumo Ab Type 12 (12F)*: 3.2 ug/mL (ref >1.3)
Pneumo Ab Type 14*: 5 ug/mL (ref >1.3)
Pneumo Ab Type 17 (17F)*: <0.1 ug/mL — ABNORMAL LOW (ref >1.3)
Pneumo Ab Type 19 (19F)*: <0.1 ug/mL — ABNORMAL LOW (ref >1.3)
Pneumo Ab Type 2*: 0.2 ug/mL — ABNORMAL LOW (ref >1.3)
Pneumo Ab Type 20*: <0.2 ug/mL — ABNORMAL LOW (ref >1.3)
Pneumo Ab Type 22 (22F)*: <0.1 ug/mL — ABNORMAL LOW (ref >1.3)
Pneumo Ab Type 23 (23F)*: <0.1 ug/mL — ABNORMAL LOW (ref >1.3)
Pneumo Ab Type 26 (6B)*: <0.1 ug/mL — ABNORMAL LOW (ref >1.3)
Pneumo Ab Type 3*: <0.1 ug/mL — ABNORMAL LOW (ref >1.3)
Pneumo Ab Type 34 (10A)*: <0.1 ug/mL — ABNORMAL LOW (ref >1.3)
Pneumo Ab Type 4*: <0.1 ug/mL — ABNORMAL LOW (ref >1.3)
Pneumo Ab Type 43 (11A)*: 0.2 ug/mL — ABNORMAL LOW (ref >1.3)
Pneumo Ab Type 5*: <0.1 ug/mL — ABNORMAL LOW (ref >1.3)
Pneumo Ab Type 51 (7F)*: 0.9 ug/mL — ABNORMAL LOW (ref >1.3)
Pneumo Ab Type 54 (15B)*: 2.7 ug/mL (ref >1.3)
Pneumo Ab Type 56 (18C)*: <0.1 ug/mL — ABNORMAL LOW (ref >1.3)
Pneumo Ab Type 57 (19A)*: <0.1 ug/mL — ABNORMAL LOW (ref >1.3)
Pneumo Ab Type 68 (9V)*: <0.1 ug/mL — ABNORMAL LOW (ref >1.3)
Pneumo Ab Type 70 (33F)*: 0.4 ug/mL — ABNORMAL LOW (ref >1.3)
Pneumo Ab Type 8*: 0.5 ug/mL — ABNORMAL LOW (ref >1.3)
Pneumo Ab Type 9 (9N)*: <0.1 ug/mL — ABNORMAL LOW (ref >1.3)

## 2024-04-26 LAB — IGG, IGA, IGM
IgG (Immunoglobin G), Serum: 720 mg/dL (ref 586–1602)
IgM (Immunoglobulin M), Srm: 73 mg/dL (ref 26–217)
Immunoglobulin A, (IgA) QN, Serum: 114 mg/dL (ref 87–352)

## 2024-04-26 LAB — DIPHTHERIA / TETANUS ANTIBODY PANEL
Diphtheria Ab: 0.17 [IU]/mL (ref <0.10)
Tetanus Ab, IgG: 0.86 [IU]/mL (ref <0.10)

## 2024-04-26 LAB — COMPLEMENT, TOTAL: Compl, Total (CH50): >60 U/mL (ref >41)

## 2024-04-28 ENCOUNTER — Ambulatory Visit: Payer: Self-pay | Admitting: Adult Health

## 2024-04-28 ENCOUNTER — Telehealth: Payer: Self-pay

## 2024-04-28 NOTE — Telephone Encounter (Signed)
 Copied from CRM [id number]. Topic: Clinical - Prescription Issue >> Apr 27, 2024  2:54 PM Joesph PARAS wrote: Reason for CRM: Patient states that insurance does not cover fluticasone  furoate-vilanterol (BREO ELLIPTA) 100-25 MCG/ACT AEPB. She states is will be around $300 monthly for the inhaler and she cannot afford hat. Patient would like to discuss alternatives (preferably cheaper).  Vm / LM okay per DPR - will forward to pharmacy team to look into what will be coved by insurance

## 2024-04-28 NOTE — Telephone Encounter (Signed)
 Copied from CRM [id number]. Topic: Clinical - Prescription Issue >> Apr 27, 2024  2:54 PM Rebecca [name] wrote: Reason for CRM: Patient states that insurance does not cover fluticasone  furoate-vilanterol (BREO ELLIPTA) 100-25 MCG/ACT AEPB. She states is will be around $300 monthly for the inhaler and she cannot afford hat. Patient would like to discuss alternatives (preferably cheaper).

## 2024-05-04 ENCOUNTER — Ambulatory Visit: Payer: Self-pay | Admitting: Family

## 2024-05-04 NOTE — Progress Notes (Signed)
 Your immune workup was not normal, but we need to do further workup to figure you out. We first looked at immunoglobulins.  Immunoglobulins are proteins that bind to and neutralize bacteria and viruses. Your immunoglobulin levels were normal.  Next we checked your specific immunoglobulins to routine vaccinations.  You were protective against diphtheria. You were protective against tetanus.  We also looked at protection against a bacteria called Streptococcus pneumonia.  This is a bacteria that causes sinus infections, ear infections, and pneumonia.  You were protective to 3 out of 23 strains of Streptococcus pneumonia which is a poor response. Has she had Pneumovax before?If yes how long ago? If she got the Pneumovax over 5 years ago I would like for her to get a repeat Pneumovax and 6 weeks later we will check her immune systems response to the vaccine. Please let us  know when you if this done so we can get lab orders ready. If she has never had Pneumovax I recommend that she get this and 6 weeks later we get lab work to check her immune systems response to the vaccine. If she has had the Pneumovax with in the past 5 years another option she could get is Prevnar. We also looked at complement activity.  Complement as a protein made by your liver which helps your immune system to work more efficiently.  This activity was normal. We can do more of a workup if your infections continue to be a problem.

## 2024-05-08 NOTE — Progress Notes (Signed)
 Patient was notified of the message per [name] FNP, and made aware that this message was sent to her MyChart.  She stated she would call and get the vaccine and do a follow up with us . No other question or concerns.

## 2024-05-09 ENCOUNTER — Other Ambulatory Visit: Payer: Self-pay | Admitting: Obstetrics and Gynecology

## 2024-05-09 ENCOUNTER — Encounter: Payer: Self-pay | Admitting: Obstetrics and Gynecology

## 2024-05-09 ENCOUNTER — Ambulatory Visit: Payer: Self-pay

## 2024-05-09 ENCOUNTER — Ambulatory Visit: Admitting: Family Medicine

## 2024-05-09 DIAGNOSIS — Z853 Personal history of malignant neoplasm of breast: Secondary | ICD-10-CM

## 2024-05-09 NOTE — Telephone Encounter (Signed)
 FYI Only or Action Required?: FYI only for provider: appointment scheduled on today.  Patient was last seen in primary care on 03/27/2024 by Theophilus Andrews, Tully GRADE, MD.  Called Nurse Triage reporting Hypertension.  Symptoms began 1-2 weeks ago.  Interventions attempted: Prescription medications: has been taking her blood pressure medication.  Symptoms are: gradually worsening.  Triage Disposition: See Physician Within 24 Hours  Patient/caregiver understands and will follow disposition?: Yes     Copied from CRM #[id number]. Topic: Clinical - Red Word Triage >> May 09, 2024  1:39 PM Rosina BIRCH wrote: Red Word that prompted transfer to Nurse Triage: blood pressure high and having headaches for a week  160/50 and 180/60      Reason for Disposition  Systolic BP >= 180 OR Diastolic >= 110  Answer Assessment - Initial Assessment Questions 1. BLOOD PRESSURE: What is your blood pressure? Did you take at least two measurements 5 minutes apart?     Has not taken it today, recently 160/50 and 180/60 2. ONSET: When did you take your blood pressure?     Elevated for a couple of weeks  3. HOW: How did you take your blood pressure? (e.g., automatic home BP monitor, visiting nurse)     Automatic BP cuff  4. HISTORY: Do you have a history of high blood pressure?     Yes 5. MEDICINES: Are you taking any medicines for blood pressure? Have you missed any doses recently?     Has been taking her medication  6. OTHER SYMPTOMS: Do you have any symptoms? (e.g., blurred vision, chest pain, difficulty breathing, headache, weakness)     Headache  Protocols used: Blood Pressure - High-A-AH

## 2024-05-10 ENCOUNTER — Ambulatory Visit: Payer: Self-pay

## 2024-05-10 NOTE — Telephone Encounter (Signed)
 FYI Only or Action Required?: FYI only for provider: Pt unable to make appt yesterday and needing to reschedule, but earliest available is 05/15/24. UC advised for today, appt scheduled in office for 12/1.   Patient was last seen in primary care on 03/27/2024 by Theophilus Andrews, Tully GRADE, MD.  Called Nurse Triage reporting Hypertension.  Symptoms began yesterday.  Interventions attempted: Nothing.  Symptoms are: unchanged.  Triage Disposition: Information or Advice Only Call  Patient/caregiver understands and will follow disposition?: YesCopied from CRM #[id number]. Topic: Clinical - Red Word Triage >> May 10, 2024 11:34 AM Rea ORN wrote: Red Word that prompted transfer to Nurse Triage: High BP with headache Reason for Disposition  [1] Follow-up call to recent contact AND [2] information only call, no triage required  Answer Assessment - Initial Assessment Questions 1. REASON FOR CALL: What is the main reason for your call? or How can I best help you?     Patient called yesterday 11/25 for high blood pressure with headache, but was unable to make it to her appt yesterday; calling back in to reschedule; No changes to previous symptoms  2. SYMPTOMS : Do you have any symptoms?      Headache, denies any other symptoms   3. OTHER QUESTIONS: Do you have any other questions?     No  Protocols used: Information Only Call - No Triage-A-AH

## 2024-05-10 NOTE — Telephone Encounter (Signed)
 Called and spoke with patient, Bp is 167/80 patient is still having headaches per Dr. Mercer patient can take 2 hydrochlorothiazide  12.5 mg and 2 potassium 20 meq and keep her appt for Monday, patient is aware and advised to go to UC or Ed if symptoms get worse or fail to improve

## 2024-05-15 ENCOUNTER — Ambulatory Visit (INDEPENDENT_AMBULATORY_CARE_PROVIDER_SITE_OTHER): Admitting: Family Medicine

## 2024-05-15 ENCOUNTER — Encounter: Payer: Self-pay | Admitting: Family Medicine

## 2024-05-15 VITALS — BP 140/84 | HR 90 | Temp 97.8°F | Ht 61.0 in | Wt 203.0 lb

## 2024-05-15 DIAGNOSIS — R490 Dysphonia: Secondary | ICD-10-CM

## 2024-05-15 DIAGNOSIS — K219 Gastro-esophageal reflux disease without esophagitis: Secondary | ICD-10-CM | POA: Diagnosis not present

## 2024-05-15 DIAGNOSIS — D0512 Intraductal carcinoma in situ of left breast: Secondary | ICD-10-CM

## 2024-05-15 DIAGNOSIS — I10 Essential (primary) hypertension: Secondary | ICD-10-CM

## 2024-05-15 DIAGNOSIS — R635 Abnormal weight gain: Secondary | ICD-10-CM

## 2024-05-15 DIAGNOSIS — R252 Cramp and spasm: Secondary | ICD-10-CM | POA: Diagnosis not present

## 2024-05-15 DIAGNOSIS — E876 Hypokalemia: Secondary | ICD-10-CM

## 2024-05-15 DIAGNOSIS — Z9884 Bariatric surgery status: Secondary | ICD-10-CM

## 2024-05-15 MED ORDER — HYDROCHLOROTHIAZIDE 25 MG PO TABS: 25.0000 mg | ORAL_TABLET | Freq: Every day | ORAL | 3 refills | Status: AC

## 2024-05-15 MED ORDER — POTASSIUM CHLORIDE CRYS ER 20 MEQ PO TBCR: 40.0000 meq | EXTENDED_RELEASE_TABLET | Freq: Every day | ORAL | 3 refills | Status: AC

## 2024-05-15 NOTE — Progress Notes (Signed)
 "  Established Patient Office Visit   Subjective  Patient ID: Rebecca [name], female    DOB: Sep 09, 1955  Age: 68 y.o. MRN: [id number]  Chief Complaint  Patient presents with   Acute Visit    Patient came in today for elevated  Bp (167/80) patient is having headaches  2 hydrochlorothiazide  12.5 mg and 2 potassium 20 meq started last week     Pt is a 68 yo female seen for acute concerns.  Pt with elevated bp readings.  Notified office.  Started taking hydrochlorothiazide  25 mg and extra potassium.  HAs and bp started to improve.  Endorses severe throat pain, described as extremely painful. Suspects it might be related to acid reflux. Taking acid reflux medication twice daily but sometimes forgets to take it before meals. Also reports frequent belching and occasional stomach cramps that feel like gas pain. No burning sensation in the throat, sour acid taste in the mouth, chest pain, or worsening of reflux symptoms at night.  H/o  HTN.  BP was elevated last week. Continued medication regimen and added an extra dose as instructed. At home, BP readings under 140, typically around 125-130, but she notes issues with her blood pressure monitor giving error messages. Pt gained approximately 25 pounds after having her lap band removed, which she believes may be contributing to her elevated blood pressure and acid reflux symptoms.  She experiences intermittent headaches and leg cramps despite taking potassium supplements. She attributes some of her symptoms to inadequate water  intake and plans to increase her water  consumption.  She mentions a recent visit to another doctor who conducted tests. She is considering weight loss medications like Ozempic but was advised that her blood pressure needs to be controlled first.  She was informed by an allergist's office that she needs a vaccination, but she is unsure of the specifics.   On tamoxifen  for DCIS.    Patient Active Problem List   Diagnosis Date  Noted   Legally blind in left eye, as defined in [place]  01/18/2024   Chronic knee pain after total replacement of right knee joint 01/18/2024   History of laparoscopic adjustable gastric banding 10/27/2023   History of non-ST elevation myocardial infarction (NSTEMI) 10/27/2023   Pharyngoesophageal dysphagia 10/27/2023   GERD (gastroesophageal reflux disease) 06/18/2023   Acute asthma exacerbation 06/15/2023   Asthma exacerbation 06/14/2023   Abnormal CT of the chest 06/04/2023   Failed total knee arthroplasty 12/28/2022   Failed total right knee replacement 12/28/2022   Genetic testing 04/01/2022   Ductal carcinoma in situ of breast 03/26/2022   OA (osteoarthritis) of knee 08/18/2021   S/P total knee arthroplasty, right 08/18/2021   Prediabetes 12/29/2019   Vitamin D  deficiency 12/29/2019   Class 2 obesity due to excess calories with serious comorbidity and body mass index (BMI) of 38.0 to 38.9 in adult 08/31/2016   Hyperlipidemia 05/04/2016   Essential hypertension 12/26/2012   Asthma 12/26/2012   OSA (obstructive sleep apnea) 10/05/2011   Past Medical History:  Diagnosis Date   Anxiety    Arthritis    Asthma    Blindness, legal    L EYE   Breast cancer (HCC)    GERD (gastroesophageal reflux disease)    Hearing loss    Hyperlipidemia    Hypertension    Lapband May 2013 11/18/2011   Migraines    Obesity, Class III, BMI 40-49.9 (morbid obesity) (HCC) 06/03/2011   Pre-diabetes    Sleep apnea    uses c-pap  Vertigo    Past Surgical History:  Procedure Laterality Date   ABDOMINAL HYSTERECTOMY  2006   fibroids   BREAST LUMPECTOMY WITH RADIOACTIVE SEED LOCALIZATION Left 04/21/2022   Procedure: LEFT BREAST LUMPECTOMY WITH RADIOACTIVE SEED LOCALIZATION;  Surgeon: Belinda Cough, MD;  Location: [facility];  Service: General;  Laterality: Left;   BREATH TEK H PYLORI  08/17/2011   Procedure: BREATH TEK H PYLORI;  Surgeon: Cough KATHEE Lunger, MD;  Location: [facility];  Service: General;  Laterality: N/A;  PATIENT WILL COME AT 0715   COLONOSCOPY  2008   @ [facility]    EYE SURGERY     Patient unsure of surgery date. Left eye   KNEE SURGERY Right 1992   right knee arthroscopy   LAPAROSCOPIC GASTRIC BANDING  10/20/2011   Procedure: LAPAROSCOPIC GASTRIC BANDING;  Surgeon: Cough KATHEE Lunger, MD;  Location: [facility];  Service: General;  Laterality: N/A;   RE-EXCISION OF BREAST LUMPECTOMY Left 05/05/2022   Procedure: RE-EXCISION OF LEFT BREAST LUMPECTOMY SITE POSTERIOR MARGIN;  Surgeon: Belinda Cough, MD;  Location: [facility];  Service: General;  Laterality: Left;   TOTAL KNEE ARTHROPLASTY Right 08/18/2021   Procedure: TOTAL KNEE ARTHROPLASTY;  Surgeon: Melodi Lerner, MD;  Location: [facility];  Service: Orthopedics;  Laterality: Right;   TOTAL KNEE REVISION Right 12/28/2022   Procedure: Right knee femoral versus total knee arthroplasty revision;  Surgeon: Melodi Lerner, MD;  Location: [facility];  Service: Orthopedics;  Laterality: Right;   Social History   Tobacco Use   Smoking status: Former    Types: Cigarettes    Passive exposure: Never   Smokeless tobacco: Never   Tobacco comments:    Quit smoking in 1980. Smoked about 10-15 years, 1/2ppd  Vaping Use   Vaping status: Never Used  Substance Use Topics   Alcohol use: No   Drug use: No   Family History  Problem Relation Age of Onset   Breast cancer Mother 48   Alcohol abuse Father    Heart disease Father 2       MI   Hypertension Father    Colon cancer Brother        dx after 7   Breast cancer Daughter 18   Allergies  Allergen Reactions   Hydrocodone Hives, Shortness Of Breath and Itching   Atorvastatin      Bone pain   Lisinopril  Cough   Sulfa Antibiotics Hives    ROS Negative unless stated above    Objective:     BP (!) 140/84 (BP Location: Left Arm, Patient Position: Sitting, Cuff Size: Large)   Pulse 90   Temp 97.8 F (36.6 C) (Oral)   Ht 5' 1 (1.549 m)    Wt 203 lb (92.1 kg)   SpO2 98%   BMI 38.36 kg/m  BP Readings from Last 3 Encounters:  05/15/24 (!) 140/84  04/20/24 138/80  04/19/24 (!) 142/100   Wt Readings from Last 3 Encounters:  05/15/24 203 lb (92.1 kg)  04/20/24 202 lb (91.6 kg)  03/27/24 199 lb 1.6 oz (90.3 kg)      Physical Exam Constitutional:      General: She is not in acute distress.    Appearance: Normal appearance.  HENT:     Head: Normocephalic and atraumatic.     Nose: Nose normal.     Mouth/Throat:     Mouth: Mucous membranes are moist.  Cardiovascular:     Rate and Rhythm: Normal rate and regular  rhythm.     Heart sounds: Normal heart sounds. No murmur heard.    No gallop.  Pulmonary:     Effort: Pulmonary effort is normal. No respiratory distress.     Breath sounds: Normal breath sounds. No wheezing, rhonchi or rales.  Skin:    General: Skin is warm and dry.  Neurological:     Mental Status: She is alert and oriented to person, place, and time.        05/15/2024    2:19 PM 12/31/2023    2:22 PM 10/27/2023    1:00 PM  Depression screen PHQ 2/9  Decreased Interest 1 0 1  Down, Depressed, Hopeless 1 0 0  PHQ - 2 Score 2 0 1  Altered sleeping 1 1 1   Tired, decreased energy 3 1 1   Change in appetite 0 0 0  Feeling bad or failure about yourself  0 0 0  Trouble concentrating 0 1 1  Moving slowly or fidgety/restless 0 0 0  Suicidal thoughts 0 0 0  PHQ-9 Score 6 3  4    Difficult doing work/chores  Somewhat difficult Somewhat difficult     Data saved with a previous flowsheet row definition      05/15/2024    2:19 PM 12/31/2023    2:22 PM 10/27/2023    1:00 PM 04/21/2023   11:45 AM  GAD 7 : Generalized Anxiety Score  Nervous, Anxious, on Edge 0  1 1  Control/stop worrying 0 0 0 1  Worry too much - different things 1 1 0 1  Trouble relaxing 0 0 0 1  Restless 0 0 0 0  Easily annoyed or irritable 1 1 1 1   Afraid - awful might happen 0 0 0 0  Total GAD 7 Score 2  2 5   Anxiety Difficulty  Somewhat difficult Somewhat difficult Not difficult at all Not difficult at all     No results found for any visits on 05/15/24.    Assessment & Plan:   Essential hypertension -     hydroCHLOROthiazide ; Take 1 tablet (25 mg total) by mouth daily.  Dispense: 90 tablet; Refill: 3  Gastroesophageal reflux disease, unspecified whether esophagitis present -     Ambulatory referral to Gastroenterology  Weight gain  History of removal of laparoscopic gastric banding device  Ductal carcinoma in situ (DCIS) of left breast  Hoarse voice quality  Muscle cramping  Hypokalemia -     Potassium Chloride  Crys ER; Take 2 tablets (40 mEq total) by mouth daily.  Dispense: 180 tablet; Refill: 3  BP elevation likely caused HAs.  Increase hydrochlorothiazide  to 25 mg daily.  Increase Kdur to 40 mEq daily.  Monitor BP at home and keep a log to bring with you to clinic.   Lifestyle modifications encouraged.    Increased GERD sx likely causing hoarse voice and ST.  PPI restarted.  Diet changes encouraged.  Prior h/o lap band s/p removal likely also contributing to due to wt gain.  4 lbs in the last 1.5 months.  Body mass index is 38.36 kg/m.  Wt loss also encouraged.  F/u with GI as sx have continued.    Muscle cramping likely related to hypokalemia.  Increasing Kdur to 40 mEq daily since adjusting hydrochlorothiazide  dose.    DCIS on tamoxifen .  Continue f/u with oncology.    Pt unclear on vaccine needed per allergist.  Will review further.  Recommendations to follow.  Return in about 2 months (around 07/16/2024).  Clotilda JONELLE Single, MD "

## 2024-05-18 ENCOUNTER — Other Ambulatory Visit: Payer: Self-pay | Admitting: Family Medicine

## 2024-05-18 DIAGNOSIS — I10 Essential (primary) hypertension: Secondary | ICD-10-CM

## 2024-05-19 ENCOUNTER — Telehealth: Payer: Self-pay

## 2024-05-19 DIAGNOSIS — G4733 Obstructive sleep apnea (adult) (pediatric): Secondary | ICD-10-CM

## 2024-05-19 NOTE — Telephone Encounter (Signed)
  Copied from CRM [id number]. Topic: Clinical - Medical Advice >> May 19, 2024 12:26 PM Rilla B wrote: Reason for CRM: Patient is having issues with using CPAP. Patient states she has not used her CPAP because one morning she woke up and her breathing was cut off and she spent time in hospital. She is to have another home sleep test but is fearful to use. Please call patient to discuss @ [phone].   Called- patient does not feel comfortable doing the HST, wants to do an in lab study instead. Okay to place order, please advice ?  Thanks

## 2024-05-20 NOTE — Telephone Encounter (Signed)
 Okay set up for split night sleep study  Order placed

## 2024-05-25 ENCOUNTER — Ambulatory Visit: Payer: Self-pay

## 2024-05-25 NOTE — Telephone Encounter (Signed)
 FYI Only or Action Required?: FYI only for provider: appointment scheduled on 05/26/24.  Patient was last seen in primary care on 05/15/2024 by [name], MD.  Called Nurse Triage reporting Hoarse.  Symptoms began 2 weeks ago.  Interventions attempted: Rest, hydration, or home remedies.  Symptoms are: gradually worsening.  Triage Disposition: See PCP When Office is Open (Within 3 Days)  Patient/caregiver understands and will follow disposition?: Yes  Reason for Disposition  [1] MODERATE to SEVERE hoarseness (e.g., interferes with work) AND [2] professional voice user (e.g., lobbyist, singer, runner, broadcasting/film/video)  (Exception: Current common cold or mild URI symptoms.)  Answer Assessment - Initial Assessment Questions 1. DESCRIPTION: Describe your voice. (e.g., coarse, raspy, weaker, airy, scratchy, deeper)     Pt reports hoarse voice x 2 weeks. Denies any other symptoms.  2. SEVERITY: How bad is it?     Severe 3. ONSET: When did the hoarseness begin?     2 weeks 4. COUGH: Is there a cough? If Yes, ask: How bad is it?     Denies 5. FEVER: Do you have a fever? If Yes, ask: What is your temperature, how was it measured, and when did it start?     Denies 6. OTHER SYMPTOMS: Do you have any other symptoms?      Denies  Protocols used: [facility] Copied from CRM #[id number]. Topic: Clinical - Medication Question >> May 25, 2024 12:54 PM Larissa S wrote: Reason for CRM: Patient is requesting a prescription for prednisone . She states she has been hoarse for 9 days, no additional symptoms.

## 2024-05-25 NOTE — Telephone Encounter (Signed)
 Pt appt sch for 12/12

## 2024-05-26 ENCOUNTER — Encounter (INDEPENDENT_AMBULATORY_CARE_PROVIDER_SITE_OTHER): Payer: Self-pay

## 2024-05-26 ENCOUNTER — Ambulatory Visit: Admitting: Family Medicine

## 2024-05-26 ENCOUNTER — Encounter: Payer: Self-pay | Admitting: Family Medicine

## 2024-05-26 ENCOUNTER — Telehealth: Payer: Self-pay

## 2024-05-26 VITALS — BP 162/98 | HR 89 | Temp 98.6°F | Ht 61.0 in | Wt 203.8 lb

## 2024-05-26 DIAGNOSIS — R490 Dysphonia: Secondary | ICD-10-CM

## 2024-05-26 DIAGNOSIS — F439 Reaction to severe stress, unspecified: Secondary | ICD-10-CM | POA: Diagnosis not present

## 2024-05-26 DIAGNOSIS — I10 Essential (primary) hypertension: Secondary | ICD-10-CM

## 2024-05-26 DIAGNOSIS — R1906 Epigastric swelling, mass or lump: Secondary | ICD-10-CM

## 2024-05-26 DIAGNOSIS — K219 Gastro-esophageal reflux disease without esophagitis: Secondary | ICD-10-CM | POA: Diagnosis not present

## 2024-05-26 DIAGNOSIS — J029 Acute pharyngitis, unspecified: Secondary | ICD-10-CM

## 2024-05-26 DIAGNOSIS — M6208 Separation of muscle (nontraumatic), other site: Secondary | ICD-10-CM

## 2024-05-26 DIAGNOSIS — R051 Acute cough: Secondary | ICD-10-CM

## 2024-05-26 DIAGNOSIS — R0981 Nasal congestion: Secondary | ICD-10-CM

## 2024-05-26 MED ORDER — AMLODIPINE BESYLATE 5 MG PO TABS: 5.0000 mg | ORAL_TABLET | Freq: Every day | ORAL | 1 refills | Status: AC

## 2024-05-26 NOTE — Progress Notes (Unsigned)
 "  Established Patient Office Visit   Subjective  Patient ID: Rebecca [name], female    DOB: April 29, 1956  Age: 68 y.o. MRN: [id number]  Chief Complaint  Patient presents with   Acute Visit    Patient here because she has been hoarse for 2 weeks and it was really bad last night.  Patient has been drinking green tea.    Pt is a 68 yo female seen for ongoing concerns.  Pt with continued elevation in bp.  Taking hydrochlorothiazide  25 mg daily.  Kdur increased, but still having LUQ abd pain and cramping intermittently.  H/o lipoma and rectus diastasis.  States swelling in abd worse/larger.     She has been experiencing persistent throat discomfort that worsened yesterday, making it difficult to talk at times. The discomfort improves temporarily with hot tea containing ginger and lemon but recurs frequently, especially when she leaves her home. The symptoms have been ongoing since last week. She reports occasional coughing but denies runny nose or stomach pain.  She has a history of heartburn and has been taking heartburn medication twice daily before meals. Despite this, she continues to experience symptoms. She reports a sensation of something moving and getting larger in her abdomen. This sensation is exacerbated by certain movements, such as sitting up or picking up objects, and is accompanied by cramps. She recalls a previous visit to a gastroenterologist in [facility] but does not remember any imaging studies being performed.  Her blood pressure has been elevated recently, with a reading of 180/unknown, although she usually records 127/unknown at home. She attributes the recent spike to stress, dietary choices, and lack of water  intake. She has been taking hydrochlorothiazide  and potassium supplements, and she monitors her blood pressure at home. She reports stress related to personal circumstances, including caring for her husband and son, which she believes may be affecting her blood  pressure. She acknowledges that her diet could be improved, noting recent consumption of salty foods and a lack of water  intake.  No snoring at night and mentions an upcoming sleep study in February. She typically drinks three bottles of water  daily and occasionally consumes soda. She enjoys beets and is considering dietary changes to help manage her blood pressure.    Patient Active Problem List   Diagnosis Date Noted   Legally blind in left eye, as defined in [place]  01/18/2024   Chronic knee pain after total replacement of right knee joint 01/18/2024   History of laparoscopic adjustable gastric banding 10/27/2023   History of non-ST elevation myocardial infarction (NSTEMI) 10/27/2023   Pharyngoesophageal dysphagia 10/27/2023   GERD (gastroesophageal reflux disease) 06/18/2023   Acute asthma exacerbation 06/15/2023   Asthma exacerbation 06/14/2023   Abnormal CT of the chest 06/04/2023   Failed total knee arthroplasty 12/28/2022   Failed total right knee replacement 12/28/2022   Genetic testing 04/01/2022   Ductal carcinoma in situ of breast 03/26/2022   OA (osteoarthritis) of knee 08/18/2021   S/P total knee arthroplasty, right 08/18/2021   Prediabetes 12/29/2019   Vitamin D  deficiency 12/29/2019   Class 2 obesity due to excess calories with serious comorbidity and body mass index (BMI) of 38.0 to 38.9 in adult 08/31/2016   Hyperlipidemia 05/04/2016   Essential hypertension 12/26/2012   Asthma 12/26/2012   OSA (obstructive sleep apnea) 10/05/2011   Past Medical History:  Diagnosis Date   Anxiety    Arthritis    Asthma    Blindness, legal    L EYE  Breast cancer (HCC)    GERD (gastroesophageal reflux disease)    Hearing loss    Hyperlipidemia    Hypertension    Lapband May 2013 11/18/2011   Migraines    Obesity, Class III, BMI 40-49.9 (morbid obesity) (HCC) 06/03/2011   Pre-diabetes    Sleep apnea    uses c-pap   Vertigo    Past Surgical History:  Procedure  Laterality Date   ABDOMINAL HYSTERECTOMY  2006   fibroids   BREAST LUMPECTOMY WITH RADIOACTIVE SEED LOCALIZATION Left 04/21/2022   Procedure: LEFT BREAST LUMPECTOMY WITH RADIOACTIVE SEED LOCALIZATION;  Surgeon: Belinda Cough, MD;  Location: [facility];  Service: General;  Laterality: Left;   BREATH TEK H PYLORI  08/17/2011   Procedure: BREATH TEK H PYLORI;  Surgeon: Cough KATHEE Lunger, MD;  Location: [facility];  Service: General;  Laterality: N/A;  PATIENT WILL COME AT 0715   COLONOSCOPY  2008   @ [facility]    EYE SURGERY     Patient unsure of surgery date. Left eye   KNEE SURGERY Right 1992   right knee arthroscopy   LAPAROSCOPIC GASTRIC BANDING  10/20/2011   Procedure: LAPAROSCOPIC GASTRIC BANDING;  Surgeon: Cough KATHEE Lunger, MD;  Location: [facility];  Service: General;  Laterality: N/A;   RE-EXCISION OF BREAST LUMPECTOMY Left 05/05/2022   Procedure: RE-EXCISION OF LEFT BREAST LUMPECTOMY SITE POSTERIOR MARGIN;  Surgeon: Belinda Cough, MD;  Location: [facility];  Service: General;  Laterality: Left;   TOTAL KNEE ARTHROPLASTY Right 08/18/2021   Procedure: TOTAL KNEE ARTHROPLASTY;  Surgeon: Melodi Lerner, MD;  Location: [facility];  Service: Orthopedics;  Laterality: Right;   TOTAL KNEE REVISION Right 12/28/2022   Procedure: Right knee femoral versus total knee arthroplasty revision;  Surgeon: Melodi Lerner, MD;  Location: [facility];  Service: Orthopedics;  Laterality: Right;   Social History[1] Family History  Problem Relation Age of Onset   Breast cancer Mother 60   Alcohol abuse Father    Heart disease Father 84       MI   Hypertension Father    Colon cancer Brother        dx after 20   Breast cancer Daughter 52   Allergies[2]  ROS Negative unless stated above    Objective:     BP (!) 180/90 (BP Location: Left Arm, Patient Position: Sitting, Cuff Size: Large)   Pulse 89   Temp 98.6 F (37 C) (Oral)   Ht 5' 1 (1.549 m)   Wt 203 lb 12.8 oz (92.4  kg)   SpO2 99%   BMI 38.51 kg/m  BP Readings from Last 3 Encounters:  05/26/24 (!) 180/90  05/15/24 (!) 140/84  04/20/24 138/80   Wt Readings from Last 3 Encounters:  05/26/24 203 lb 12.8 oz (92.4 kg)  05/15/24 203 lb (92.1 kg)  04/20/24 202 lb (91.6 kg)      Physical Exam Constitutional:      General: She is not in acute distress.    Appearance: Normal appearance.  HENT:     Head: Normocephalic and atraumatic.     Nose: Nose normal.     Mouth/Throat:     Mouth: Mucous membranes are moist.  Cardiovascular:     Rate and Rhythm: Normal rate and regular rhythm.     Heart sounds: Normal heart sounds. No murmur heard.    No gallop.  Pulmonary:     Effort: Pulmonary effort is normal. No respiratory distress.  Breath sounds: Normal breath sounds. No wheezing, rhonchi or rales.  Abdominal:     General: Bowel sounds are normal. There is distension.     Palpations: Abdomen is soft. There is mass.     Tenderness: There is abdominal tenderness. There is no guarding or rebound.     Hernia: A hernia is present.   Skin:    General: Skin is warm and dry.  Neurological:     Mental Status: She is alert and oriented to person, place, and time.        05/15/2024    2:19 PM 12/31/2023    2:22 PM 10/27/2023    1:00 PM  Depression screen PHQ 2/9  Decreased Interest 1 0 1  Down, Depressed, Hopeless 1 0 0  PHQ - 2 Score 2 0 1  Altered sleeping 1 1 1   Tired, decreased energy 3 1 1   Change in appetite 0 0 0  Feeling bad or failure about yourself  0 0 0  Trouble concentrating 0 1 1  Moving slowly or fidgety/restless 0 0 0  Suicidal thoughts 0 0 0  PHQ-9 Score 6 3  4    Difficult doing work/chores  Somewhat difficult Somewhat difficult     Data saved with a previous flowsheet row definition      05/15/2024    2:19 PM 12/31/2023    2:22 PM 10/27/2023    1:00 PM 04/21/2023   11:45 AM  GAD 7 : Generalized Anxiety Score  Nervous, Anxious, on Edge 0  1 1  Control/stop worrying 0 0  0 1  Worry too much - different things 1 1 0 1  Trouble relaxing 0 0 0 1  Restless 0 0 0 0  Easily annoyed or irritable 1 1 1 1   Afraid - awful might happen 0 0 0 0  Total GAD 7 Score 2  2 5   Anxiety Difficulty Somewhat difficult Somewhat difficult Not difficult at all Not difficult at all     No results found for any visits on 05/26/24.    Assessment & Plan:   Rectus diastasis -     CT ABDOMEN PELVIS WO CONTRAST; Future  Stress  Hoarse voice quality -     Ambulatory referral to ENT  Gastroesophageal reflux disease, unspecified whether esophagitis present  Epigastric mass -     CT ABDOMEN PELVIS WO CONTRAST; Future  Essential hypertension -     amLODIPine  Besylate; Take 1 tablet (5 mg total) by mouth daily.  Dispense: 90 tablet; Refill: 1   Worsening rectus diastasis now with intermittent abd pain.  Last imaging CT abdomen pelvis without contrast 07/25/2021 with persistent slight fat density variation in subcu soft tissues of the left anterior superior abdominal wall.  No definite evidence of a lipomatous lesion.  Tiny fat-containing umbilical hernia.  Gastric lap band surgical changes.  Colonic diverticulosis with no acute diverticulitis.  Nonobstructive right nephrolithiasis up to 3 mm and aortic atherosclerosis.   ***Upper respiratory tract infection   She experiences intermittent sore throat, which improves with hot tea and ginger. There is no significant cough or rhinorrhea.  Hypertension   Her blood pressure is elevated at 180 mmHg, likely due to diet, stress, and hydration, though home readings are around 127/80 mmHg. Stress and poor sleep may contribute. Encourage dietary modifications to reduce sodium intake, advise on stress management techniques, and encourage adequate hydration.  Hiatal hernia with gastroesophageal reflux disease   She has persistent heartburn, possibly exacerbated by a hiatal  hernia, with symptoms of discomfort and hoarseness. Surgical  intervention may be considered if symptoms worsen. Continue current heartburn medication regimen and consider imaging to assess the size and impact of the hiatal hernia.  Diastasis recti   She experiences abdominal bulging when sitting up, possibly due to diastasis recti. Surgical repair may be considered if symptoms worsen. Consider imaging to assess the extent of diastasis recti.  Abdominal wall lipoma   There is a lipoma on the abdominal wall, possibly causing discomfort. Surgical removal will be considered if it becomes painful or cosmetically concerning.***  Return in about 6 weeks (around 07/07/2024).   Clotilda JONELLE Single, MD      [1]  Social History Tobacco Use   Smoking status: Former    Types: Cigarettes    Passive exposure: Never   Smokeless tobacco: Never   Tobacco comments:    Quit smoking in 1980. Smoked about 10-15 years, 1/2ppd  Vaping Use   Vaping status: Never Used  Substance Use Topics   Alcohol use: No   Drug use: No  [2]  Allergies Allergen Reactions   Hydrocodone Hives, Shortness Of Breath and Itching   Atorvastatin      Bone pain   Lisinopril  Cough   Sulfa Antibiotics Hives   "

## 2024-05-26 NOTE — Telephone Encounter (Signed)
 Called and left patient a message to return call, patient needs to go to [facility] now for Stat CT scan

## 2024-05-30 ENCOUNTER — Inpatient Hospital Stay: Admission: RE | Admit: 2024-05-30 | Discharge: 2024-05-30 | Attending: Family Medicine

## 2024-05-30 DIAGNOSIS — R1906 Epigastric swelling, mass or lump: Secondary | ICD-10-CM

## 2024-05-30 DIAGNOSIS — M6208 Separation of muscle (nontraumatic), other site: Secondary | ICD-10-CM

## 2024-05-31 ENCOUNTER — Ambulatory Visit: Payer: Self-pay | Admitting: Family Medicine

## 2024-06-06 ENCOUNTER — Encounter

## 2024-06-16 ENCOUNTER — Telehealth: Payer: Self-pay | Admitting: Hematology and Oncology

## 2024-06-16 NOTE — Telephone Encounter (Signed)
 I spoke with patient as she called in to schedule an appointment with MD. Patient aware of date/time.

## 2024-06-19 NOTE — Patient Instructions (Incomplete)
 Asthma/bronchiectasis/centrilobular emphysema-not well controlled - Continue to follow up with pulmonology. - Continue Breo 100 mcg one puff once a day as prescribed by pulmonology - Continue Singulair  10mg  daily.Patient cautioned that rarely some children/adults can experience behavioral changes after beginning montelukast . These side effects are rare, however, if you notice any change, notify the clinic and discontinue montelukast . - have access to Albuterol  inhaler 2 puffs every 4-6 hours as needed for cough, wheeze, shortness of breath, chest tightness.   Asthma control goals:  Full participation in all desired activities (may need albuterol  before activity) Albuterol  use two time or less a week on average (not counting use with activity) Cough interfering with sleep two time or less a month Oral steroids no more than once a year No hospitalizations  Mixed rhinitis-controlled - May use Ryaltris  2 sprays each nostril twice a day at this time.  This is a combination nasal spray with nasal steroid (for congestion control) and nasal antihistamine (for drainage control).   - May use Zyrtec  10mg  daily as needed for runny nose - Saline nasal wash each evening in the shower and as needed. -Keep upcoming appointment with ENT on  06/30/24 for hoarseness  Reflux -Continue Protonix  40 mg once a day as per pulmonology -Take medications as prescribed  Follow-up in  months with Dr. Jeneal or sooner if needed.

## 2024-06-20 ENCOUNTER — Ambulatory Visit: Admitting: Family

## 2024-06-27 ENCOUNTER — Inpatient Hospital Stay: Attending: Hematology and Oncology | Admitting: Hematology and Oncology

## 2024-06-30 ENCOUNTER — Encounter (INDEPENDENT_AMBULATORY_CARE_PROVIDER_SITE_OTHER): Payer: Self-pay

## 2024-06-30 ENCOUNTER — Ambulatory Visit (INDEPENDENT_AMBULATORY_CARE_PROVIDER_SITE_OTHER)

## 2024-06-30 VITALS — BP 136/81 | HR 77 | Ht 61.0 in | Wt 203.5 lb

## 2024-06-30 DIAGNOSIS — J3801 Paralysis of vocal cords and larynx, unilateral: Secondary | ICD-10-CM | POA: Diagnosis not present

## 2024-06-30 DIAGNOSIS — R490 Dysphonia: Secondary | ICD-10-CM | POA: Diagnosis not present

## 2024-06-30 DIAGNOSIS — K219 Gastro-esophageal reflux disease without esophagitis: Secondary | ICD-10-CM

## 2024-06-30 NOTE — Patient Instructions (Signed)
 I have ordered an imaging study for you to complete prior to your next visit. Please call [facility] Scheduling at [phone] to schedule your imaging if you have not received a call within 24 hours. If you are unable to complete your imaging study prior to your next scheduled visit please call our office to let us  know.

## 2024-06-30 NOTE — Progress Notes (Signed)
 Dear Dr. Mercer, Here is my assessment for our mutual patient, Rebecca [name]. Thank you for allowing me the opportunity to care for your patient. Please do not hesitate to contact me should you have any other questions. Sincerely, Dr. Hadassah Parody  Otolaryngology Clinic Note Referring provider: Dr. Mercer HPI:   Initial HPI (06/30/2024)  69 year old female with asthma and GERD who presents for several months of persistent hoarseness  Feels she has had persistent hoarseness for several months with day-to-day variability in severity.  Today she feels like she has mild hoarseness.  Feels like the onset of symptoms followed a hospitalization last year for acute illness with significant coughing attributed asthma and possibly RSV.  She did not require intubation at the time.  Remote history of tobacco use; quit over 40 years ago  Takes gabapentin  100 3 times daily Takes Protonix  40 mg daily  Independent Review of Additional Tests or Records:  Referral note 05/26/2024 Rebecca Mercer, MD: Intermittent sore throat cough possibly due to LPR refer to ENT  12/31/2023 HbA1c 5.5 04/20/2024 ANA positive   PMH/Meds/All/SocHx/FamHx/ROS:   Past Medical History:  Diagnosis Date   Anxiety    Arthritis    Asthma    Blindness, legal    L EYE   Breast cancer (HCC)    GERD (gastroesophageal reflux disease)    Hearing loss    Hyperlipidemia    Hypertension    Lapband May 2013 11/18/2011   Migraines    Obesity, Class III, BMI 40-49.9 (morbid obesity) (HCC) 06/03/2011   Pre-diabetes    Sleep apnea    uses c-pap   Vertigo      Past Surgical History:  Procedure Laterality Date   ABDOMINAL HYSTERECTOMY  2006   fibroids   BREAST LUMPECTOMY WITH RADIOACTIVE SEED LOCALIZATION Left 04/21/2022   Procedure: LEFT BREAST LUMPECTOMY WITH RADIOACTIVE SEED LOCALIZATION;  Surgeon: Belinda Cough, MD;  Location: [facility];  Service: General;  Laterality: Left;   BREATH TEK H PYLORI   08/17/2011   Procedure: BREATH TEK H PYLORI;  Surgeon: Cough KATHEE Lunger, MD;  Location: [facility];  Service: General;  Laterality: N/A;  PATIENT WILL COME AT 0715   COLONOSCOPY  2008   @ [facility]    EYE SURGERY     Patient unsure of surgery date. Left eye   KNEE SURGERY Right 1992   right knee arthroscopy   LAPAROSCOPIC GASTRIC BANDING  10/20/2011   Procedure: LAPAROSCOPIC GASTRIC BANDING;  Surgeon: Cough KATHEE Lunger, MD;  Location: [facility];  Service: General;  Laterality: N/A;   RE-EXCISION OF BREAST LUMPECTOMY Left 05/05/2022   Procedure: RE-EXCISION OF LEFT BREAST LUMPECTOMY SITE POSTERIOR MARGIN;  Surgeon: Belinda Cough, MD;  Location: [facility];  Service: General;  Laterality: Left;   TOTAL KNEE ARTHROPLASTY Right 08/18/2021   Procedure: TOTAL KNEE ARTHROPLASTY;  Surgeon: Melodi Lerner, MD;  Location: [facility];  Service: Orthopedics;  Laterality: Right;   TOTAL KNEE REVISION Right 12/28/2022   Procedure: Right knee femoral versus total knee arthroplasty revision;  Surgeon: Melodi Lerner, MD;  Location: [facility];  Service: Orthopedics;  Laterality: Right;    Family History  Problem Relation Age of Onset   Breast cancer Mother 43   Alcohol abuse Father    Heart disease Father 50       MI   Hypertension Father    Colon cancer Brother        dx after 49   Breast cancer Daughter 55  Social Connections: Moderately Integrated (12/31/2023)   Social Connection and Isolation Panel    Frequency of Communication with Friends and Family: More than three times a week    Frequency of Social Gatherings with Friends and Family: Once a week    Attends Religious Services: More than 4 times per year    Active Member of [employer] or Organizations: No    Attends Engineer, Structural: Patient unable to answer    Marital Status: Married     Current Outpatient Medications  Medication Instructions   albuterol  (PROVENTIL ) 2.5 mg, Nebulization, Every 4 hours PRN   albuterol   (VENTOLIN  HFA) 108 (90 Base) MCG/ACT inhaler Use 2 puffs every 4 hours as needed for cough or wheeze.  May use  2 puffs 10-20 minutes prior to exercise.   amLODipine  (NORVASC ) 5 mg, Oral, Daily   azelastine  (ASTELIN ) 0.1 % nasal spray 2 sprays, Each Nare, 2 times daily, Use in each nostril as directed   fluticasone  furoate-vilanterol (BREO ELLIPTA ) 100-25 MCG/ACT AEPB 1 puff, Inhalation, Daily   gabapentin  (NEURONTIN ) 100 mg, Oral, 3 times daily   hydrochlorothiazide  (HYDRODIURIL ) 25 mg, Oral, Daily   hydrOXYzine  (ATARAX ) 25 mg, Oral, 2 times daily PRN   ibuprofen (ADVIL) 800 mg, 2 times daily PRN   montelukast  (SINGULAIR ) 10 mg, Oral, Daily at bedtime   pantoprazole  (PROTONIX ) 40 mg, Oral, Daily   potassium chloride  SA (KLOR-CON  M) 20 MEQ tablet 40 mEq, Oral, Daily   PRESCRIPTION MEDICATION See admin instructions   sodium chloride  HYPERTONIC 3 % nebulizer solution 4 mLs, As needed   tamoxifen  (NOLVADEX ) 20 mg, Oral, Daily     Physical Exam:   BP 136/81 (BP Location: Right Arm, Patient Position: Sitting)   Pulse 77   Ht 5' 1 (1.549 m)   Wt 203 lb 8 oz (92.3 kg)   SpO2 96%   BMI 38.45 kg/m   Salient findings:  CN II-XII intact with the exception of right vocal cord paresis   Bilateral EAC clear and TM intact with well pneumatized middle ear spaces  Anterior rhinoscopy: Septum midline  No lesions of oral cavity/oropharynx; dentition   No obviously palpable neck masses/lymphadenopathy/thyromegaly  No respiratory distress or stridor Dysphonia TFL was indicated to better evaluate the proximal airway, given the patient's history and exam findings, and is detailed below.   Seprately Identifiable Procedures:  Prior to initiating any procedures, risks/benefits/alternatives were explained to the patient and verbal consent obtained.  Procedure Note (06/30/2024) Pre-procedure diagnosis:  Dysphonia  Post-procedure diagnosis: Same, right vocal cord paresis Procedure: Transnasal  Fiberoptic Laryngoscopy, CPT 31575 - Mod 25 Indication: Dysphonia Complications: None apparent EBL: 0 mL  The procedure was undertaken to further evaluate the patient's complaint of dysphonia, with mirror exam inadequate for appropriate examination due to gag reflex and poor patient tolerance  Procedure:  Patient was identified as correct patient. Verbal consent was obtained. The nose was sprayed with oxymetazoline and 4% lidocaine . The The flexible laryngoscope was passed through the nose to view the nasal cavity, pharynx (oropharynx, hypopharynx) and larynx.  The larynx was examined at rest and during multiple phonatory tasks. Documentation was obtained and reviewed with patient. The scope was removed. The patient tolerated the procedure well.  Findings: The nasal cavity and nasopharynx did not reveal any masses or lesions, mucosa appeared to be without obvious lesions. The tongue base, pharyngeal walls, piriform sinuses, vallecula, epiglottis and postcricoid region are normal in appearance except moderate postcricoid edema. The visualized portion of the subglottis  and proximal trachea is widely patent.  The right vocal cord is paretic.  there are no lesions on the free edge of the vocal folds nor elsewhere in the larynx worrisome for malignancy.    Electronically signed by: Hadassah JAYSON Parody, MD 06/30/2024 11:24 AM   Impression & Plans:  Rebecca [name] is a 69 y.o. female with   1. Paresis of right vocal cord   2. Dysphonia      Assessment & Plan Right vocal cord paresis Persistent hoarseness for several months following hospitalization for asthma and possible RSV infection. Laryngoscopy demonstrated mild right vocal cord paresis without evidence of malignancy or structural abnormality. Etiology may be idiopathic, with potential for spontaneous improvement. Low suspicion for malignancy, but further imaging is indicated to exclude compressive lesions of the recurrent laryngeal nerve. -  Ordered CT neck with contrast to evaluate for mass or lesion compressing the recurrent laryngeal nerve. - Scheduled follow-up in one month to review CT results and reassess vocal cord function.  Gastroesophageal reflux disease Postcricoid edema present on scope exam.  Continue current reflux regimen   See below regarding exact medications prescribed this encounter including dosages and route: No orders of the defined types were placed in this encounter.    Thank you for allowing me the opportunity to care for your patient. Please do not hesitate to contact me should you have any other questions.  Sincerely, Hadassah Parody, MD Otolaryngologist (ENT), [facility] Phone: [phone] Fax: [phone]  MDM:  Level 4 Complexity/Problems addressed: 4-new problem uncertain prognosis Data complexity: 4-  independent review of referral note, 2 labs  - Morbidity: -   - Prescription Drug prescribed or managed:

## 2024-07-06 ENCOUNTER — Encounter

## 2024-07-07 ENCOUNTER — Encounter

## 2024-07-26 ENCOUNTER — Ambulatory Visit: Admitting: Podiatry

## 2024-07-26 ENCOUNTER — Encounter

## 2024-08-02 ENCOUNTER — Ambulatory Visit (HOSPITAL_BASED_OUTPATIENT_CLINIC_OR_DEPARTMENT_OTHER): Admitting: Pulmonary Disease

## 2024-08-11 ENCOUNTER — Ambulatory Visit (INDEPENDENT_AMBULATORY_CARE_PROVIDER_SITE_OTHER)
